# Patient Record
Sex: Female | Born: 1945
Health system: Southern US, Community
[De-identification: ages and names within clinical notes are randomized; demographics above are authoritative.]

## PROBLEM LIST (undated history)

## (undated) DIAGNOSIS — Z9289 Personal history of other medical treatment: Secondary | ICD-10-CM

## (undated) DIAGNOSIS — Z9989 Dependence on other enabling machines and devices: Secondary | ICD-10-CM

## (undated) DIAGNOSIS — I5042 Chronic combined systolic (congestive) and diastolic (congestive) heart failure: Secondary | ICD-10-CM

## (undated) DIAGNOSIS — I4819 Other persistent atrial fibrillation: Secondary | ICD-10-CM

## (undated) DIAGNOSIS — S72009A Fracture of unspecified part of neck of unspecified femur, initial encounter for closed fracture: Secondary | ICD-10-CM

## (undated) DIAGNOSIS — I428 Other cardiomyopathies: Secondary | ICD-10-CM

## (undated) DIAGNOSIS — N182 Chronic kidney disease, stage 2 (mild): Secondary | ICD-10-CM

## (undated) DIAGNOSIS — C4490 Unspecified malignant neoplasm of skin, unspecified: Secondary | ICD-10-CM

## (undated) DIAGNOSIS — I10 Essential (primary) hypertension: Secondary | ICD-10-CM

## (undated) DIAGNOSIS — J189 Pneumonia, unspecified organism: Secondary | ICD-10-CM

## (undated) DIAGNOSIS — R0600 Dyspnea, unspecified: Secondary | ICD-10-CM

## (undated) DIAGNOSIS — C50919 Malignant neoplasm of unspecified site of unspecified female breast: Secondary | ICD-10-CM

## (undated) HISTORY — PX: PARTIAL HIP ARTHROPLASTY: SHX733

## (undated) HISTORY — DX: Fracture of unspecified part of neck of unspecified femur, initial encounter for closed fracture: S72.009A

## (undated) HISTORY — DX: Malignant neoplasm of unspecified site of unspecified female breast: C50.919

## (undated) HISTORY — DX: Chronic combined systolic (congestive) and diastolic (congestive) heart failure: I50.42

## (undated) HISTORY — PX: TUBAL LIGATION: SHX77

## (undated) HISTORY — DX: Chronic kidney disease, stage 2 (mild): N18.2

## (undated) HISTORY — DX: Other persistent atrial fibrillation: I48.19

## (undated) HISTORY — PX: OTHER SURGICAL HISTORY: SHX169

---

## 1997-06-10 DIAGNOSIS — C50919 Malignant neoplasm of unspecified site of unspecified female breast: Secondary | ICD-10-CM

## 1997-06-10 HISTORY — DX: Malignant neoplasm of unspecified site of unspecified female breast: C50.919

## 1997-06-10 HISTORY — PX: MASTECTOMY: SHX3

## 1998-05-23 ENCOUNTER — Other Ambulatory Visit: Admission: RE | Admit: 1998-05-23 | Discharge: 1998-05-23 | Payer: Self-pay | Admitting: Obstetrics & Gynecology

## 2000-02-29 ENCOUNTER — Other Ambulatory Visit: Admission: RE | Admit: 2000-02-29 | Discharge: 2000-02-29 | Payer: Self-pay | Admitting: Obstetrics & Gynecology

## 2000-04-17 ENCOUNTER — Encounter: Admission: RE | Admit: 2000-04-17 | Discharge: 2000-04-17 | Payer: Self-pay | Admitting: Surgery

## 2000-04-17 ENCOUNTER — Encounter: Payer: Self-pay | Admitting: Surgery

## 2000-04-17 ENCOUNTER — Other Ambulatory Visit: Admission: RE | Admit: 2000-04-17 | Discharge: 2000-04-17 | Payer: Self-pay | Admitting: Surgery

## 2000-04-23 ENCOUNTER — Encounter: Admission: RE | Admit: 2000-04-23 | Discharge: 2000-07-22 | Payer: Self-pay | Admitting: *Deleted

## 2000-05-05 ENCOUNTER — Encounter: Admission: RE | Admit: 2000-05-05 | Discharge: 2000-05-05 | Payer: Self-pay | Admitting: Surgery

## 2000-05-05 ENCOUNTER — Encounter: Payer: Self-pay | Admitting: Surgery

## 2000-05-09 ENCOUNTER — Ambulatory Visit (HOSPITAL_BASED_OUTPATIENT_CLINIC_OR_DEPARTMENT_OTHER): Admission: RE | Admit: 2000-05-09 | Discharge: 2000-05-09 | Payer: Self-pay | Admitting: Surgery

## 2000-05-09 ENCOUNTER — Encounter (INDEPENDENT_AMBULATORY_CARE_PROVIDER_SITE_OTHER): Payer: Self-pay | Admitting: *Deleted

## 2000-05-09 ENCOUNTER — Encounter: Payer: Self-pay | Admitting: Surgery

## 2000-06-05 ENCOUNTER — Ambulatory Visit (HOSPITAL_COMMUNITY): Admission: RE | Admit: 2000-06-05 | Discharge: 2000-06-06 | Payer: Self-pay | Admitting: Surgery

## 2000-06-05 ENCOUNTER — Encounter (INDEPENDENT_AMBULATORY_CARE_PROVIDER_SITE_OTHER): Payer: Self-pay | Admitting: Specialist

## 2001-03-27 ENCOUNTER — Other Ambulatory Visit: Admission: RE | Admit: 2001-03-27 | Discharge: 2001-03-27 | Payer: Self-pay | Admitting: Obstetrics & Gynecology

## 2002-04-09 ENCOUNTER — Other Ambulatory Visit: Admission: RE | Admit: 2002-04-09 | Discharge: 2002-04-09 | Payer: Self-pay | Admitting: Obstetrics & Gynecology

## 2002-10-20 ENCOUNTER — Other Ambulatory Visit: Admission: RE | Admit: 2002-10-20 | Discharge: 2002-10-20 | Payer: Self-pay | Admitting: Obstetrics & Gynecology

## 2003-04-20 ENCOUNTER — Other Ambulatory Visit: Admission: RE | Admit: 2003-04-20 | Discharge: 2003-04-20 | Payer: Self-pay | Admitting: Obstetrics & Gynecology

## 2003-04-20 ENCOUNTER — Other Ambulatory Visit: Admission: RE | Admit: 2003-04-20 | Discharge: 2003-04-20 | Payer: Self-pay | Admitting: Internal Medicine

## 2004-04-24 ENCOUNTER — Other Ambulatory Visit: Admission: RE | Admit: 2004-04-24 | Discharge: 2004-04-24 | Payer: Self-pay | Admitting: Obstetrics & Gynecology

## 2005-05-13 ENCOUNTER — Other Ambulatory Visit: Admission: RE | Admit: 2005-05-13 | Discharge: 2005-05-13 | Payer: Self-pay | Admitting: Obstetrics & Gynecology

## 2009-03-10 LAB — HM PAP SMEAR

## 2009-06-10 DIAGNOSIS — S72009A Fracture of unspecified part of neck of unspecified femur, initial encounter for closed fracture: Secondary | ICD-10-CM

## 2009-06-10 HISTORY — DX: Fracture of unspecified part of neck of unspecified femur, initial encounter for closed fracture: S72.009A

## 2010-02-12 ENCOUNTER — Inpatient Hospital Stay (HOSPITAL_COMMUNITY): Admission: EM | Admit: 2010-02-12 | Discharge: 2010-02-16 | Payer: Self-pay | Admitting: Emergency Medicine

## 2010-08-23 LAB — BASIC METABOLIC PANEL
BUN: 12 mg/dL (ref 6–23)
BUN: 6 mg/dL (ref 6–23)
BUN: 8 mg/dL (ref 6–23)
CO2: 28 mEq/L (ref 19–32)
Calcium: 7.8 mg/dL — ABNORMAL LOW (ref 8.4–10.5)
Chloride: 103 mEq/L (ref 96–112)
Chloride: 108 mEq/L (ref 96–112)
Chloride: 109 mEq/L (ref 96–112)
Creatinine, Ser: 0.59 mg/dL (ref 0.4–1.2)
Creatinine, Ser: 0.98 mg/dL (ref 0.4–1.2)
GFR calc Af Amer: >60 mL/min (ref >60)
GFR calc Af Amer: >60 mL/min (ref >60)
GFR calc Af Amer: >60 mL/min (ref >60)
GFR calc non Af Amer: 58 mL/min — ABNORMAL LOW (ref >60)
GFR calc non Af Amer: >60 mL/min (ref >60)
GFR calc non Af Amer: >60 mL/min (ref >60)
Potassium: 3.2 mEq/L — ABNORMAL LOW (ref 3.5–5.1)
Potassium: 3.6 mEq/L (ref 3.5–5.1)
Potassium: 4.3 mEq/L (ref 3.5–5.1)
Sodium: 137 mEq/L (ref 135–145)
Sodium: 138 mEq/L (ref 135–145)

## 2010-08-23 LAB — PROTIME-INR
INR: 1.06 (ref 0.00–1.49)
INR: 1.18 (ref 0.00–1.49)
INR: 1.32 (ref 0.00–1.49)
INR: 1.75 — ABNORMAL HIGH (ref 0.00–1.49)
Prothrombin Time: 15.2 seconds (ref 11.6–15.2)
Prothrombin Time: 16.6 seconds — ABNORMAL HIGH (ref 11.6–15.2)

## 2010-08-23 LAB — CBC
HCT: 29.5 % — ABNORMAL LOW (ref 36.0–46.0)
HCT: 34.8 % — ABNORMAL LOW (ref 36.0–46.0)
Hemoglobin: 12 g/dL (ref 12.0–15.0)
Hemoglobin: 9.8 g/dL — ABNORMAL LOW (ref 12.0–15.0)
MCH: 32.7 pg (ref 26.0–34.0)
MCHC: 33.3 g/dL (ref 30.0–36.0)
MCV: 93.8 fL (ref 78.0–100.0)
MCV: 94 fL (ref 78.0–100.0)
MCV: 95.1 fL (ref 78.0–100.0)
Platelets: 150 10*3/uL (ref 150–400)
Platelets: 195 10*3/uL (ref 150–400)
RBC: 3.22 MIL/uL — ABNORMAL LOW (ref 3.87–5.11)
RBC: 3.71 MIL/uL — ABNORMAL LOW (ref 3.87–5.11)
RDW: 13.3 % (ref 11.5–15.5)
RDW: 13.4 % (ref 11.5–15.5)
RDW: 13.9 % (ref 11.5–15.5)
WBC: 10 10*3/uL (ref 4.0–10.5)
WBC: 11.3 10*3/uL — ABNORMAL HIGH (ref 4.0–10.5)
WBC: 8.9 10*3/uL (ref 4.0–10.5)

## 2010-08-23 LAB — URINE MICROSCOPIC-ADD ON

## 2010-08-23 LAB — COMPREHENSIVE METABOLIC PANEL
AST: 17 U/L (ref 0–37)
BUN: 11 mg/dL (ref 6–23)
CO2: 25 mEq/L (ref 19–32)
Calcium: 8.3 mg/dL — ABNORMAL LOW (ref 8.4–10.5)
Creatinine, Ser: 0.84 mg/dL (ref 0.4–1.2)
GFR calc Af Amer: >60 mL/min (ref >60)
GFR calc non Af Amer: >60 mL/min (ref >60)

## 2010-08-23 LAB — DIFFERENTIAL
Basophils Absolute: 0 10*3/uL (ref 0.0–0.1)
Eosinophils Absolute: 0 10*3/uL (ref 0.0–0.7)
Eosinophils Relative: 0 % (ref 0–5)

## 2010-08-23 LAB — URINE CULTURE: Culture  Setup Time: 201109060529

## 2010-08-23 LAB — HEMOGLOBIN A1C
Hgb A1c MFr Bld: 5.2 % (ref <5.7)
Mean Plasma Glucose: 103 mg/dL (ref <117)

## 2010-08-23 LAB — TYPE AND SCREEN: ABO/RH(D): O POS

## 2010-08-23 LAB — URINALYSIS, ROUTINE W REFLEX MICROSCOPIC
Bilirubin Urine: NEGATIVE
Hgb urine dipstick: NEGATIVE
Specific Gravity, Urine: 1.021 (ref 1.005–1.030)
Urobilinogen, UA: 1 mg/dL (ref 0.0–1.0)

## 2010-08-23 LAB — POCT CARDIAC MARKERS
Myoglobin, poc: 80.8 ng/mL (ref 12–200)
Troponin i, poc: <0.05 ng/mL (ref 0.00–0.09)

## 2010-10-26 NOTE — Op Note (Signed)
[place]. [facility]  Patient:    Natasha Dickson, Natasha Dickson                        MRN: [id number] Proc. Date: 06/05/00 Adm. Date:  [id number] Disc. Date: [id number] Attending:  Charlton Haws                           Operative Report  PREOPERATIVE DIAGNOSES: 1. Acquired absence, left breast. 2. Personal history of carcinoma of the left breast.  PROCEDURE:  Immediate breast reconstruction with retropectoral placement of McGhan silicone-textured saline-filled prosthesis, 300 cc normal saline added.  SURGEON:  Consuello Bossier., M.D.  ASSISTANT:  Magnus Ivan, R.N.F.A.  ANESTHESIA:  General endotracheal.  FINDINGS:  Please see Dr. Weldon Inches operative note for his left total mastectomy.  I had apprised the patient of her options, including immediate versus delayed reconstruction and the possibility of doing a TRAM flap versus a prosthetic reconstruction.  The above surgical procedure was carried out.  DESCRIPTION OF PROCEDURE:  The patient had a left total mastectomy.  There was noted to be good hemostasis.  The pectoralis major muscle was split in the direction of its fibers.  With fiberoptic illumination, a generous retropectoral space was created, trying to get as best as possible inferiorly, laterally, and medially, trying to get as much total tissue coverage as possible.  At this point, the wound was further irrigated with normal saline and hemostasis thoroughly checked.  A 10 mm Blake drain was placed in the axilla and brought out through a separate stab wound.  The tissue expander had 200 cc of normal saline added, and then it was placed and the muscle closed with interrupted 3-0 Vicryl.  An additional 100 cc of normal saline was added to give a total of 300 cc.  Of note, the specimen removed by Dr. Jamey Ripa weighed 933 g.  At this point, the subcutaneous tissues were closed with interrupted 3-0 Vicryl.  The skin was then closed with running  subcuticular 4-0 Vicryl.  Steri-Strips, Xeroform, 4 x 8s, ABD, and a Hypafix dressing were then applied.  The patient tolerated the procedure well and was able to be discharged from the operating room to the recovery room in satisfactory condition. DD:  06/05/00 TD:  06/05/00 Job: [id number]

## 2010-10-26 NOTE — Op Note (Signed)
[place]. [facility]  Patient:    Natasha Dickson, Natasha Dickson                        MRN: [id number] Proc. Date: 06/05/00 Adm. Date:  [id number] Disc. Date: [id number] Attending:  Charlton Haws                           Operative Report  PREOPERATIVE DIAGNOSIS:  Ductal carcinoma in situ, left breast.  POSTOPERATIVE DIAGNOSIS:  Ductal carcinoma in situ, left breast.  PROCEDURE:  Left total mastectomy.  SURGEON:  Currie Paris, M.D.  ANESTHESIA:  General endotracheal.  CLINICAL HISTORY:  The patient is a 65 year old woman who had a partial mastectomy done for DCIS, but unfortunately both her medial margin, which was subareolar, and the superior margin were positive focally for residual DCIS. I had a lengthy discussion of the alternatives, both with radiation therapy and with the patient, and it was elected to proceed to total mastectomy with immediate reconstruction via an implant.  Sentinel node dissection was done at the time of the partial mastectomy and was negative.  DESCRIPTION OF PROCEDURE:  The patient was brought to the operating room and after satisfactory general endotracheal anesthesia had been obtained, the breast was prepped and draped.  Both sides were prepped out as a field so that Dr. Stephens November would have the opposite side to view for symmetry purposes.  An elliptical outline was made, trying to spare as much skin as possible since we had noninvasive disease, but excising the prior scar from the lumpectomy, which started at the edge of the areola laterally and went laterally almost to the anterior axillary line.  After the incision was made, a superior flap was raised medially to the sternum, superiorly to the clavicle, and laterally out into the axillary area, elevating some of the skin off so that we would be able to get any of the axillary tail breast tissue.  The inferior flap was then raised after making the incision and taken  medially to the sternum, inferior to the inframammary fold, and laterally out to about the level of the latissimus.  The patient is fairly obese, and it was difficult to ascertain this exactly initially, but once we freed up the flap a little bit more, I was able to see the latissimus and end the dissection at that level.  The breast was then removed from the underlying muscle starting medially and working laterally, using coagulation current of the cautery.  I did enter the old biopsy site and re-excised that, taking a little bit of muscle laterally right at the pectoral edge to be sure we had all this tissue completely removed.  We did not do any further axillary dissection.  Once the breast had been removed, the wound was irrigated, and hemostasis achieved.  At this point, Dr. Stephens November came in and proceeded to do the reconstruction and close.  The patient tolerated the procedure well.  Estimated blood loss was 100 cc. There were no operative complications to this point. DD:  06/05/00 TD:  06/05/00 Job: [id number]

## 2011-03-26 ENCOUNTER — Ambulatory Visit (INDEPENDENT_AMBULATORY_CARE_PROVIDER_SITE_OTHER): Payer: Medicare Other | Admitting: Family Medicine

## 2011-03-26 ENCOUNTER — Encounter: Payer: Self-pay | Admitting: Family Medicine

## 2011-03-26 DIAGNOSIS — Z8249 Family history of ischemic heart disease and other diseases of the circulatory system: Secondary | ICD-10-CM

## 2011-03-26 DIAGNOSIS — E7800 Pure hypercholesterolemia, unspecified: Secondary | ICD-10-CM

## 2011-03-26 DIAGNOSIS — R87619 Unspecified abnormal cytological findings in specimens from cervix uteri: Secondary | ICD-10-CM

## 2011-03-26 DIAGNOSIS — C50919 Malignant neoplasm of unspecified site of unspecified female breast: Secondary | ICD-10-CM

## 2011-03-26 DIAGNOSIS — R739 Hyperglycemia, unspecified: Secondary | ICD-10-CM

## 2011-03-26 DIAGNOSIS — Z23 Encounter for immunization: Secondary | ICD-10-CM

## 2011-03-26 DIAGNOSIS — M8580 Other specified disorders of bone density and structure, unspecified site: Secondary | ICD-10-CM

## 2011-03-26 DIAGNOSIS — Z13220 Encounter for screening for lipoid disorders: Secondary | ICD-10-CM

## 2011-03-26 DIAGNOSIS — S72009A Fracture of unspecified part of neck of unspecified femur, initial encounter for closed fracture: Secondary | ICD-10-CM

## 2011-03-26 DIAGNOSIS — E785 Hyperlipidemia, unspecified: Secondary | ICD-10-CM

## 2011-03-26 LAB — HM MAMMOGRAPHY

## 2011-03-26 NOTE — Patient Instructions (Signed)
Come back for fasting labs. You can get your results through our phone system.  Follow the instructions on the blue card. See Shirlee Limerick about your referral before your leave today. Take care.  We'll request your records.  Glad to see you today.

## 2011-03-27 ENCOUNTER — Encounter: Payer: Self-pay | Admitting: Family Medicine

## 2011-03-27 DIAGNOSIS — E785 Hyperlipidemia, unspecified: Secondary | ICD-10-CM | POA: Insufficient documentation

## 2011-03-27 DIAGNOSIS — R87619 Unspecified abnormal cytological findings in specimens from cervix uteri: Secondary | ICD-10-CM | POA: Insufficient documentation

## 2011-03-27 DIAGNOSIS — S72009A Fracture of unspecified part of neck of unspecified femur, initial encounter for closed fracture: Secondary | ICD-10-CM | POA: Insufficient documentation

## 2011-03-27 DIAGNOSIS — Z23 Encounter for immunization: Secondary | ICD-10-CM | POA: Insufficient documentation

## 2011-03-27 NOTE — Assessment & Plan Note (Signed)
Screening for, return for labs.

## 2011-03-27 NOTE — Assessment & Plan Note (Signed)
Requesting records, she'll call about f/u with gyn.

## 2011-03-27 NOTE — Progress Notes (Signed)
New pt to est care.  Requesting records.    H/o hip fracture with lower bone mass per pt.  We talked about options.  Due for DXA.  H/o breast cancer, s/p mastectomy with implant.  Due for mammogram.  No breast tenderness, mass, lump, discharge, skin changes.   Screening for hyperlipidemia, with FH CAD and h/o mild hyperglycemia on prev labs.  Will return for labs.    H/o abnormal pap, per Dr. Chevis Pretty with gyn.    PMH and SH reviewed  ROS: See HPI, otherwise noncontributory.  Meds, vitals, and allergies reviewed.   GEN: nad, alert and oriented HEENT: mucous membranes moist NECK: supple w/o LA CV: rrr PULM: ctab, no inc wob ABD: soft, +bs EXT: no edema SKIN: no acute rash

## 2011-03-27 NOTE — Assessment & Plan Note (Signed)
Done today.

## 2011-03-27 NOTE — Assessment & Plan Note (Addendum)
Requesting records, schedule DXA.  Continue vit D.  Check labs.  >25 min spent with face to face with patient, >50% counseling and/or coordinating care.

## 2011-03-29 ENCOUNTER — Other Ambulatory Visit (INDEPENDENT_AMBULATORY_CARE_PROVIDER_SITE_OTHER): Payer: Medicare Other

## 2011-03-29 DIAGNOSIS — R7309 Other abnormal glucose: Secondary | ICD-10-CM

## 2011-03-29 DIAGNOSIS — S72009A Fracture of unspecified part of neck of unspecified femur, initial encounter for closed fracture: Secondary | ICD-10-CM

## 2011-03-29 DIAGNOSIS — E7800 Pure hypercholesterolemia, unspecified: Secondary | ICD-10-CM

## 2011-03-29 DIAGNOSIS — M899 Disorder of bone, unspecified: Secondary | ICD-10-CM

## 2011-03-29 DIAGNOSIS — M8580 Other specified disorders of bone density and structure, unspecified site: Secondary | ICD-10-CM

## 2011-03-29 DIAGNOSIS — R739 Hyperglycemia, unspecified: Secondary | ICD-10-CM

## 2011-03-29 LAB — BASIC METABOLIC PANEL
BUN: 15 mg/dL (ref 6–23)
CO2: 25 mEq/L (ref 19–32)
Calcium: 8.8 mg/dL (ref 8.4–10.5)
Glucose, Bld: 85 mg/dL (ref 70–99)
Sodium: 142 mEq/L (ref 135–145)

## 2011-03-29 LAB — LIPID PANEL
HDL: 61.9 mg/dL (ref >39.00)
Triglycerides: 95 mg/dL (ref 0.0–149.0)

## 2011-03-29 LAB — HM DEXA SCAN

## 2011-03-30 LAB — VITAMIN D 25 HYDROXY (VIT D DEFICIENCY, FRACTURES): Vit D, 25-Hydroxy: 18 ng/mL — ABNORMAL LOW (ref 30–89)

## 2011-03-31 ENCOUNTER — Other Ambulatory Visit: Payer: Self-pay | Admitting: Family Medicine

## 2011-04-01 ENCOUNTER — Encounter: Payer: Self-pay | Admitting: Family Medicine

## 2011-04-02 ENCOUNTER — Encounter: Payer: Self-pay | Admitting: *Deleted

## 2011-04-02 ENCOUNTER — Encounter: Payer: Self-pay | Admitting: Family Medicine

## 2011-04-03 ENCOUNTER — Other Ambulatory Visit: Payer: Self-pay | Admitting: *Deleted

## 2011-04-03 MED ORDER — ERGOCALCIFEROL 1.25 MG (50000 UT) PO CAPS: ORAL_CAPSULE | ORAL | Status: DC

## 2011-04-04 ENCOUNTER — Encounter: Payer: Self-pay | Admitting: *Deleted

## 2011-04-04 ENCOUNTER — Telehealth: Payer: Self-pay | Admitting: *Deleted

## 2011-04-04 NOTE — Telephone Encounter (Signed)
Try getting vit d 2000 units/tab, OTC.  Take 1 twice a day and recheck level as planned.  Thanks.

## 2011-04-04 NOTE — Telephone Encounter (Signed)
Pt states prescription strength vitamin D is expensive and she is asking if she can just take otc, and, if so, how much should she take.

## 2011-04-04 NOTE — Telephone Encounter (Signed)
Patient advised.  I asked if we could set up the recheck level appt but again she denied and says she will call for it in 8 weeks.

## 2013-02-09 ENCOUNTER — Telehealth: Payer: Self-pay | Admitting: Family Medicine

## 2013-02-09 NOTE — Telephone Encounter (Signed)
Left message on home answering machine asking pt to return my call to reschedule her appt

## 2013-02-09 NOTE — Telephone Encounter (Signed)
Daughter calling with multiple concerns, I explained that Dr. Para March may or may not be able to do everything at one visit because of time and per the daughter if we don't go everything now she may not come back. Daughter states pt has blue toes and her ankles are swollen they are scared she may have a DVT, I immediately advised she needed to go to the emergency room and per daughter the pt will not go. She also has concerns about dementia, always being cold, a rash and several other concerns. I rescheduled the appt to Monday at 2:00 to allow the daughter to be able to come and allow for more time ( she was originally scheduled for ) pt was last seen 2012.

## 2013-02-09 NOTE — Telephone Encounter (Signed)
Agreed,we'll try to work on what we can.  If concern for DVT then needs to be in ER.

## 2013-02-10 ENCOUNTER — Ambulatory Visit: Payer: Medicare Other | Admitting: Family Medicine

## 2013-02-10 NOTE — Telephone Encounter (Signed)
.  left message to have patient return my call.  

## 2013-02-11 ENCOUNTER — Ambulatory Visit: Payer: Medicare Other | Admitting: Family Medicine

## 2013-02-15 ENCOUNTER — Inpatient Hospital Stay (HOSPITAL_COMMUNITY)
Admission: EM | Admit: 2013-02-15 | Discharge: 2013-02-23 | DRG: 292 | Disposition: A | Discharge status: Home / Self Care | Payer: Medicare Other | Attending: Internal Medicine | Admitting: Internal Medicine

## 2013-02-15 ENCOUNTER — Ambulatory Visit: Payer: Medicare Other | Admitting: Family Medicine

## 2013-02-15 ENCOUNTER — Encounter (HOSPITAL_COMMUNITY): Payer: Self-pay | Admitting: Cardiology

## 2013-02-15 ENCOUNTER — Encounter: Payer: Self-pay | Admitting: Family Medicine

## 2013-02-15 ENCOUNTER — Ambulatory Visit (INDEPENDENT_AMBULATORY_CARE_PROVIDER_SITE_OTHER): Payer: Medicare Other | Admitting: Family Medicine

## 2013-02-15 ENCOUNTER — Emergency Department (HOSPITAL_COMMUNITY): Payer: Medicare Other

## 2013-02-15 VITALS — BP 102/66 | HR 77 | Temp 97.3°F | Ht 61.0 in | Wt 252.2 lb

## 2013-02-15 DIAGNOSIS — Z8249 Family history of ischemic heart disease and other diseases of the circulatory system: Secondary | ICD-10-CM | POA: Diagnosis not present

## 2013-02-15 DIAGNOSIS — J811 Chronic pulmonary edema: Secondary | ICD-10-CM | POA: Diagnosis not present

## 2013-02-15 DIAGNOSIS — M1990 Unspecified osteoarthritis, unspecified site: Secondary | ICD-10-CM | POA: Diagnosis present

## 2013-02-15 DIAGNOSIS — E669 Obesity, unspecified: Secondary | ICD-10-CM | POA: Diagnosis present

## 2013-02-15 DIAGNOSIS — J449 Chronic obstructive pulmonary disease, unspecified: Secondary | ICD-10-CM | POA: Diagnosis present

## 2013-02-15 DIAGNOSIS — Z853 Personal history of malignant neoplasm of breast: Secondary | ICD-10-CM | POA: Diagnosis not present

## 2013-02-15 DIAGNOSIS — I10 Essential (primary) hypertension: Secondary | ICD-10-CM | POA: Diagnosis present

## 2013-02-15 DIAGNOSIS — Z9851 Tubal ligation status: Secondary | ICD-10-CM | POA: Diagnosis not present

## 2013-02-15 DIAGNOSIS — I428 Other cardiomyopathies: Secondary | ICD-10-CM | POA: Diagnosis present

## 2013-02-15 DIAGNOSIS — I509 Heart failure, unspecified: Secondary | ICD-10-CM | POA: Diagnosis present

## 2013-02-15 DIAGNOSIS — Z452 Encounter for adjustment and management of vascular access device: Secondary | ICD-10-CM | POA: Diagnosis not present

## 2013-02-15 DIAGNOSIS — I5041 Acute combined systolic (congestive) and diastolic (congestive) heart failure: Secondary | ICD-10-CM | POA: Diagnosis not present

## 2013-02-15 DIAGNOSIS — R600 Localized edema: Secondary | ICD-10-CM

## 2013-02-15 DIAGNOSIS — Z9010 Acquired absence of unspecified breast and nipple: Secondary | ICD-10-CM | POA: Diagnosis not present

## 2013-02-15 DIAGNOSIS — R0602 Shortness of breath: Secondary | ICD-10-CM

## 2013-02-15 DIAGNOSIS — Z79899 Other long term (current) drug therapy: Secondary | ICD-10-CM

## 2013-02-15 DIAGNOSIS — Z6841 Body Mass Index (BMI) 40.0 and over, adult: Secondary | ICD-10-CM

## 2013-02-15 DIAGNOSIS — E876 Hypokalemia: Secondary | ICD-10-CM

## 2013-02-15 DIAGNOSIS — R0989 Other specified symptoms and signs involving the circulatory and respiratory systems: Secondary | ICD-10-CM | POA: Diagnosis not present

## 2013-02-15 DIAGNOSIS — Z800 Family history of malignant neoplasm of digestive organs: Secondary | ICD-10-CM | POA: Diagnosis not present

## 2013-02-15 DIAGNOSIS — Z836 Family history of other diseases of the respiratory system: Secondary | ICD-10-CM

## 2013-02-15 DIAGNOSIS — Z7901 Long term (current) use of anticoagulants: Secondary | ICD-10-CM | POA: Diagnosis not present

## 2013-02-15 DIAGNOSIS — I420 Dilated cardiomyopathy: Secondary | ICD-10-CM

## 2013-02-15 DIAGNOSIS — I4891 Unspecified atrial fibrillation: Secondary | ICD-10-CM | POA: Diagnosis not present

## 2013-02-15 DIAGNOSIS — J4489 Other specified chronic obstructive pulmonary disease: Secondary | ICD-10-CM | POA: Diagnosis present

## 2013-02-15 DIAGNOSIS — E785 Hyperlipidemia, unspecified: Secondary | ICD-10-CM | POA: Diagnosis not present

## 2013-02-15 DIAGNOSIS — I369 Nonrheumatic tricuspid valve disorder, unspecified: Secondary | ICD-10-CM | POA: Diagnosis not present

## 2013-02-15 DIAGNOSIS — R609 Edema, unspecified: Secondary | ICD-10-CM | POA: Diagnosis not present

## 2013-02-15 HISTORY — DX: Unspecified osteoarthritis, unspecified site: M19.90

## 2013-02-15 HISTORY — DX: Other cardiomyopathies: I42.8

## 2013-02-15 LAB — BASIC METABOLIC PANEL
CO2: 17 mEq/L — ABNORMAL LOW (ref 19–32)
Chloride: 102 mEq/L (ref 96–112)
GFR calc Af Amer: 78 mL/min — ABNORMAL LOW (ref >90)
Potassium: 5.1 mEq/L (ref 3.5–5.1)
Sodium: 135 mEq/L (ref 135–145)

## 2013-02-15 LAB — APTT: aPTT: 26 seconds (ref 24–37)

## 2013-02-15 LAB — CBC
MCV: 90.7 fL (ref 78.0–100.0)
Platelets: 168 10*3/uL (ref 150–400)
RBC: 5.04 MIL/uL (ref 3.87–5.11)
RDW: 16.3 % — ABNORMAL HIGH (ref 11.5–15.5)
WBC: 7.6 10*3/uL (ref 4.0–10.5)

## 2013-02-15 LAB — POCT I-STAT TROPONIN I: Troponin i, poc: 0.03 ng/mL (ref 0.00–0.08)

## 2013-02-15 LAB — PRO B NATRIURETIC PEPTIDE: Pro B Natriuretic peptide (BNP): 2906 pg/mL — ABNORMAL HIGH (ref 0–125)

## 2013-02-15 LAB — PROTIME-INR
INR: 1.31 (ref 0.00–1.49)
Prothrombin Time: 16 seconds — ABNORMAL HIGH (ref 11.6–15.2)

## 2013-02-15 MED ORDER — HEPARIN (PORCINE) IN NACL 100-0.45 UNIT/ML-% IJ SOLN
1250.0000 [IU]/h | INTRAMUSCULAR | Status: DC
Start: 2013-02-15 — End: 2013-02-17
  Administered 2013-02-15: 1050 [IU]/h via INTRAVENOUS
  Administered 2013-02-16: 1250 [IU]/h via INTRAVENOUS
  Filled 2013-02-15 (×5): qty 250

## 2013-02-15 MED ORDER — ASPIRIN 81 MG PO CHEW
324.0000 mg | CHEWABLE_TABLET | Freq: Once | ORAL | Status: AC
  Administered 2013-02-15: 324 mg via ORAL
  Filled 2013-02-15: qty 4

## 2013-02-15 MED ORDER — NITROGLYCERIN 0.4 MG SL SUBL: 0.4000 mg | SUBLINGUAL_TABLET | SUBLINGUAL | Status: DC | PRN

## 2013-02-15 MED ORDER — ASPIRIN EC 81 MG PO TBEC
81.0000 mg | DELAYED_RELEASE_TABLET | Freq: Every day | ORAL | Status: DC
  Administered 2013-02-16 – 2013-02-23 (×8): 81 mg via ORAL
  Filled 2013-02-15 (×8): qty 1

## 2013-02-15 MED ORDER — DIGOXIN 250 MCG PO TABS
0.2500 mg | ORAL_TABLET | Freq: Every day | ORAL | Status: DC
  Administered 2013-02-16 – 2013-02-23 (×8): 0.25 mg via ORAL
  Filled 2013-02-15 (×8): qty 1

## 2013-02-15 MED ORDER — FUROSEMIDE 10 MG/ML IJ SOLN
80.0000 mg | Freq: Two times a day (BID) | INTRAMUSCULAR | Status: DC
  Administered 2013-02-15 – 2013-02-22 (×14): 80 mg via INTRAVENOUS
  Filled 2013-02-15 (×16): qty 8

## 2013-02-15 MED ORDER — DIGOXIN 0.25 MG/ML IJ SOLN
0.5000 mg | Freq: Once | INTRAMUSCULAR | Status: AC
  Administered 2013-02-15: 0.5 mg via INTRAVENOUS
  Filled 2013-02-15: qty 2

## 2013-02-15 MED ORDER — DIGOXIN 0.25 MG/ML IJ SOLN
0.2500 mg | Freq: Once | INTRAMUSCULAR | Status: AC
  Administered 2013-02-16: 0.25 mg via INTRAVENOUS
  Filled 2013-02-15 (×2): qty 1

## 2013-02-15 MED ORDER — DILTIAZEM HCL 100 MG IV SOLR
5.0000 mg/h | INTRAVENOUS | Status: DC
  Administered 2013-02-15: 5 mg/h via INTRAVENOUS
  Administered 2013-02-16 – 2013-02-17 (×3): 10 mg/h via INTRAVENOUS
  Filled 2013-02-15 (×4): qty 100

## 2013-02-15 MED ORDER — DILTIAZEM HCL 25 MG/5ML IV SOLN
20.0000 mg | Freq: Once | INTRAVENOUS | Status: AC
  Administered 2013-02-15: 20 mg via INTRAVENOUS
  Filled 2013-02-15: qty 5

## 2013-02-15 MED ORDER — FUROSEMIDE 10 MG/ML IJ SOLN
40.0000 mg | Freq: Once | INTRAMUSCULAR | Status: AC
  Administered 2013-02-15: 40 mg via INTRAVENOUS
  Filled 2013-02-15: qty 4

## 2013-02-15 MED ORDER — ASPIRIN 81 MG PO CHEW
324.0000 mg | CHEWABLE_TABLET | ORAL | Status: AC
  Administered 2013-02-15: 324 mg via ORAL
  Filled 2013-02-15: qty 4

## 2013-02-15 MED ORDER — ONDANSETRON HCL 4 MG/2ML IJ SOLN: 4.0000 mg | Freq: Four times a day (QID) | INTRAMUSCULAR | Status: DC | PRN

## 2013-02-15 MED ORDER — HEPARIN BOLUS VIA INFUSION
4000.0000 [IU] | Freq: Once | INTRAVENOUS | Status: AC
  Administered 2013-02-15: 4000 [IU] via INTRAVENOUS
  Filled 2013-02-15: qty 4000

## 2013-02-15 MED ORDER — ACETAMINOPHEN 325 MG PO TABS
650.0000 mg | ORAL_TABLET | ORAL | Status: DC | PRN
  Administered 2013-02-16 – 2013-02-17 (×2): 650 mg via ORAL
  Filled 2013-02-15 (×2): qty 2

## 2013-02-15 MED ORDER — ASPIRIN 300 MG RE SUPP
300.0000 mg | RECTAL | Status: AC
  Filled 2013-02-15: qty 1

## 2013-02-15 MED ORDER — METOPROLOL TARTRATE 25 MG PO TABS
25.0000 mg | ORAL_TABLET | Freq: Four times a day (QID) | ORAL | Status: DC
  Administered 2013-02-16 – 2013-02-21 (×20): 25 mg via ORAL
  Filled 2013-02-15 (×29): qty 1

## 2013-02-15 NOTE — Assessment & Plan Note (Addendum)
In AF.  Likely has been for a period of time; family states HR has been elevated >150 at home in the last few days.  SOB with exertion and fluid overloaded.  Not reasonable for outpatient w/u.  Advised ER eval.  Declined EMS.  She'll go to [facility] ER and we'll call ahead.  D/w pt and daughter.  Daughter to drive. >25 min spent with face to face with patient, >50% counseling and/or coordinating care.

## 2013-02-15 NOTE — ED Notes (Signed)
Pt up to bedside commode without any problems 

## 2013-02-15 NOTE — Telephone Encounter (Signed)
Pt seen by Dr. Para March 02/15/13.

## 2013-02-15 NOTE — ED Notes (Signed)
IV start unsuccessful X 2 Attempts.  VAS Team paged

## 2013-02-15 NOTE — Patient Instructions (Addendum)
Go to the ER at [facility] on [facility] [address] [facility].  Don't stop on the way.  Tell them you are in AF and short of breath with walking.  Tell them you saw me this AM.  We'll call ahead.

## 2013-02-15 NOTE — ED Notes (Signed)
Patient to ED from Dr Lianne Bushy office. She  C/O severe swelling of legs, stomach.  C/O shortness of breath, difficulty sleeping.  Also C/O rapid heart rate.  C/O rash and swelling of BLE.  Denies a history of heart failure but does C/O pitting edema.

## 2013-02-15 NOTE — Progress Notes (Signed)
Seen once in 2012.  Had not had f/u since then.    Cold intolerance at baseline, but worse over the last 8-12 weeks.  Then her ankles and feet started swelling in there last 2 weeks.  She has more abd swelling. Anterior shins are reddish recently.  She gets SOB with exertion, now laying on 2-3 pillows.  No CP at all.  No dysuria.  No FCNAVD.   Weight is up 50 lbs from last OV.   Meds, vitals, and allergies reviewed.   ROS: See HPI.  Otherwise, noncontributory.  Nad but SOB with exertion.  Mmm Tachy, IRR ctab but exam limited by habitus abd soft, not ttp but exam limited by habitus Ext with 3+ BLE edema with brawny skin changes Intact DP pulses B

## 2013-02-15 NOTE — ED Notes (Signed)
Pt here with family- reports that over the past couple of weeks she has had increased swelling to the legs and feet. Also reports pain in her legs and blisters. States increased weakness also for a couple of weeks. States she was at [facility] and found new onset a-fib. Denies any chest pain at this time.

## 2013-02-15 NOTE — ED Notes (Signed)
IV team at bedside.  Warm blanket given, family at bedside

## 2013-02-15 NOTE — H&P (Signed)
Patient ID: Natasha Dickson MRN: [id number], DOB/AGE: 11-Jul-1945   Admit date: 02/15/2013 Date of Consult: @TODAY @  Primary Physician: Crawford Givens, MD Primary Cardiologist: New    Problem List: Past Medical History  Diagnosis Date  . Cancer 1999    Stage I in left breast  . Fractured hip 2011    Right.  Soreness when standing. (Dr. Cleophas Dunker)    Past Surgical History  Procedure Laterality Date  . Mastectomy  1999    Left breast with saline implant.  . Tubal ligation       Allergies: No Known Allergies  HPI: patient is a 67 yo who presents to the [facility] ER with SOB   The patient was seen by Saintclair Halsted in clinic today. She Complained of ankle/foot edema over the past couple weeks, abdominal swelling.  Also SOB with exertion and orthopnea.  She had not been seen in his clinic since 2012   She was found to be in afib with an elevated HR and she was advised to go to the ER.  The patient and her family say that she has been slowing down for awhile  She gives out easier  Very weak  No CP  No syncope.  Notes rare palpitations.     Inpatient Medications:    Family History  Problem Relation Age of Onset  . COPD Mother     from long-term smoking  . Cancer Father     colon  . Heart disease Father   . Heart disease Brother   . Heart disease Son     congenital heart disease, transposition of great vessels- corrected with  MI  as an adult     History   Social History  . Marital Status: Married    Spouse Name: N/A    Number of Children: N/A  . Years of Education: N/A   Occupational History  . Cashier [employer]   Social History Main Topics  . Smoking status: Never Smoker   . Smokeless tobacco: Never Used  . Alcohol Use: Yes  . Drug Use: No  . Sexual Activity: Not on file   Other Topics Concern  . Not on file   Social History Narrative   High School grad.   Married 40+ years   Conservation officer, nature at [employer]           Review of Systems: Patinet with years of  cold intolerance  Otherwise All other systems reviewed and are otherwise negative except as noted above.  Physical Exam: Filed Vitals:   02/15/13 1645  BP: 115/68  Pulse:   Temp:   Resp: 19    Intake/Output Summary (Last 24 hours) at 02/15/13 1723 Last data filed at 02/15/13 1648  Gross per 24 hour  Intake      0 ml  Output     75 ml  Net    -75 ml    General: Obese 67 yo who is dyspneic getting back into bed from commode Head: Normocephalic, atraumatic, sclera non-icteric Neck: Negative for carotid bruits. JVP elevated to below jaw   Lungs: Clear bilaterally to auscultation without wheezes, rales, or rhonchi. Breathing is unlabored. Heart: Irreg rate/rhythm. S1 S2. No murmurs, rubs, or gallops appreciated. Abdomen: Obese, no hepatomegaly.  No definite masses. Msk:  Strength and tone appears normal for age. Extremities: 3+ edema bilaterally   Distal pedal pulses are 2+ and equal bilaterally. Neuro: Alert and oriented X 3. Moves all extremities spontaneously. Psych:  Responds to  questions appropriately with a normal affect.  Labs: Results for orders placed during the hospital encounter of 02/15/13 (from the past 24 hour(s))  CBC     Status: Abnormal   Collection Time    02/15/13 12:32 PM      Result Value Range   WBC 7.6  4.0 - 10.5 K/uL   RBC 5.04  3.87 - 5.11 MIL/uL   Hemoglobin 14.8  12.0 - 15.0 g/dL   HCT 40.9  81.1 - 91.4 %   MCV 90.7  78.0 - 100.0 fL   MCH 29.4  26.0 - 34.0 pg   MCHC 32.4  30.0 - 36.0 g/dL   RDW 78.2 (*) 95.6 - 21.3 %   Platelets 168  150 - 400 K/uL  PROTIME-INR     Status: Abnormal   Collection Time    02/15/13  2:02 PM      Result Value Range   Prothrombin Time 16.0 (*) 11.6 - 15.2 seconds   INR 1.31  0.00 - 1.49  APTT     Status: None   Collection Time    02/15/13  2:02 PM      Result Value Range   aPTT 26  24 - 37 seconds  POCT I-STAT TROPONIN I     Status: None   Collection Time    02/15/13  3:53 PM      Result Value Range    Troponin i, poc 0.03  0.00 - 0.08 ng/mL   Comment 3           BASIC METABOLIC PANEL     Status: Abnormal   Collection Time    02/15/13  3:54 PM      Result Value Range   Sodium 135  135 - 145 mEq/L   Potassium 5.1  3.5 - 5.1 mEq/L   Chloride 102  96 - 112 mEq/L   CO2 17 (*) 19 - 32 mEq/L   Glucose, Bld 81  70 - 99 mg/dL   BUN 28 (*) 6 - 23 mg/dL   Creatinine, Ser 0.86  0.50 - 1.10 mg/dL   Calcium 8.9  8.4 - 57.8 mg/dL   GFR calc non Af Amer 67 (*) >90 mL/min   GFR calc Af Amer 78 (*) >90 mL/min  PRO B NATRIURETIC PEPTIDE     Status: Abnormal   Collection Time    02/15/13  3:54 PM      Result Value Range   Pro B Natriuretic peptide (BNP) 2906.0 (*) 0 - 125 pg/mL    Radiology/Studies: Dg Chest 2 View  02/15/2013   CLINICAL DATA:  Irregular heart beat. Swelling, shortness of breath, weakness.  EXAM: CHEST  2 VIEW  COMPARISON:  02/12/2010  FINDINGS: Cardiomegaly with vascular congestion. Lungs are clear. No effusions. Degenerative changes in the thoracic spine.  IMPRESSION: Cardiomegaly with vascular congestion.   Electronically Signed   By: Charlett Nose M.D.   On: 02/15/2013 13:19    EKG:   Atrial fibrillation  157 bpm.  Nonspecific ST T wave changes.  ASSESSMENT AND PLAN:  Patient is a 67 yo with SOB, weakness, edema.  Found to be in rapid afib.  Question duration. Plan:  Heparin.  IV Lasix  Place foley for now as patient is quite dyspnic with movements. On cardiazem  Will also give Digoxin and lopressor Check TSH Echo to evaluate LV and RV function.   Discussed with patient plans for anticoagulation and rate control  If cannot get comfortable or rates  cannot be controlled will plan TEE/Cardioversion.    2.  HTN  Patient is hypertensive on exam.  Follow as diurese  3.  DJD  Patient is poor IV access.  L arm cannot be used because of previous surgery  Will consult IV team for PICC line    Signed, Dietrich Pates 02/15/2013, 5:23 PM ]

## 2013-02-15 NOTE — Progress Notes (Signed)
ANTICOAGULATION CONSULT NOTE - Initial Consult  Pharmacy Consult for Heparin Indication: New onset Afib with RVR  No Known Allergies  Patient Measurements: Height: 5\' 1"  (154.9 cm) Weight: 252 lb (114.306 kg) IBW/kg (Calculated) : 47.8 Heparin Dosing Weight: 76 kg  Vital Signs: Temp: 97.5 F (36.4 C) (09/08 1513) Temp src: Oral (09/08 1513) BP: 117/86 mmHg (09/08 1800) Pulse Rate: 97 (09/08 1700)  Labs:  Recent Labs  02/15/13 1232 02/15/13 1402 02/15/13 1554  HGB 14.8  --   --   HCT 45.7  --   --   PLT 168  --   --   APTT  --  26  --   LABPROT  --  16.0*  --   INR  --  1.31  --   CREATININE  --   --  0.88    Estimated Creatinine Clearance: 73.9 ml/min (by C-G formula based on Cr of 0.88).   Medical History: Past Medical History  Diagnosis Date  . Cancer 1999    Stage I in left breast  . Fractured hip 2011    Right.  Soreness when standing. (Dr. Cleophas Dunker)    Assessment: 67 y.o. F who presented to the [facility] on 02/15/13 with palpitations, SOB, and LE swelling and was found to be in new-onset Afib with RVR. Pharmacy was consulted to start heparin for anticoagulation.  The patient was not on any blood thinners PTA, has no hx CVA, recent bleeding, or surgery. Baseline CBC wnl. Hep Wt: 76 kg  Goal of Therapy:  Heparin level 0.3-0.7 units/ml Monitor platelets by anticoagulation protocol: Yes   Plan:  1. Heparin bolus of 4000 units x 1 2. Initiate heparin drip at a rate of 1050 units/hr (10.5 ml/hr) 3. Daily heparin levels, CBC 4. Will continue to monitor for any signs/symptoms of bleeding and will follow up with heparin level in 8 hours   Georgina Pillion, PharmD, BCPS Clinical Pharmacist Pager: [phone] 02/15/2013 7:37 PM

## 2013-02-15 NOTE — ED Provider Notes (Signed)
TIME SEEN: 1:24 PM  CHIEF COMPLAINT: Palpitations, shortness of breath with exertion, lower extremity swelling  HPI: Patient is a 67 y.o. female with history of prior breast cancer status post mastectomy who presents the emergency department with 2 months bilateral lower extremity swelling, orthopnea, dyspnea on exertion. Patient's daughter finally convinced the patient to go to her primary care physician's office today. She was seen by Dr. Para March was found to be in A. fib with RVR. She denies any chest pain or chest discomfort. No prior history of MI, cardiac catheterization. No prior history of PE or DVT. No prior history of CHF. Denies any fever or cough. She is not chronically on oxygen.  ROS: See HPI Constitutional: no fever  Eyes: no drainage  ENT: no runny nose   Cardiovascular:  no chest pain  Resp:  SOB  GI: no vomiting GU: no dysuria Integumentary: no rash  Allergy: no hives  Musculoskeletal: no leg swelling  Neurological: no slurred speech ROS otherwise negative  PAST MEDICAL HISTORY/PAST SURGICAL HISTORY:  Past Medical History  Diagnosis Date  . Cancer 1999    Stage I in left breast  . Fractured hip 2011    Right.  Soreness when standing. (Dr. Cleophas Dunker)    MEDICATIONS:  Prior to Admission medications   Medication Sig Start Date End Date Taking? Authorizing Provider  acetaminophen (TYLENOL) 500 MG tablet Take 1,000 mg by mouth every 6 (six) hours as needed for pain.   Yes Historical Provider, MD  ibuprofen (ADVIL,MOTRIN) 200 MG tablet Take 600 mg by mouth 2 (two) times daily as needed for pain.   Yes Historical Provider, MD  neomycin-polymyxin-pramoxine (NEOSPORIN PLUS) 1 % cream Apply 1 application topically as needed (rash).   Yes Historical Provider, MD  NON FORMULARY Herbal water pill. (OTC)   Yes Historical Provider, MD    ALLERGIES:  No Known Allergies  SOCIAL HISTORY:  History  Substance Use Topics  . Smoking status: Never Smoker   . Smokeless tobacco:  Never Used  . Alcohol Use: Yes    FAMILY HISTORY: Family History  Problem Relation Age of Onset  . COPD Mother     from long-term smoking  . Cancer Father     colon  . Heart disease Father   . Heart disease Brother   . Heart disease Son     congenital heart disease, transposition of great vessels- corrected with  MI  as an adult    EXAM: BP 112/82  Pulse 154  Resp 20  SpO2 96% CONSTITUTIONAL: Alert and oriented and responds appropriately to questions. Well-appearing; well-nourished HEAD: Normocephalic EYES: Conjunctivae clear, PERRL ENT: normal nose; no rhinorrhea; moist mucous membranes; pharynx without lesions noted NECK: Supple, no meningismus, no LAD , JVD present CARD: Tachycardic, irregularly irregular; S1 and S2 appreciated; no murmurs, no clicks, no rubs, no gallops RESP: Normal chest excursion without splinting or tachypnea; patient has crackles at her bases bilaterally, no wheezing or rhonchi ABD/GI: Normal bowel sounds; non-distended; soft, non-tender, no rebound, no guarding BACK:  The back appears normal and is non-tender to palpation, there is no CVA tenderness EXT: Normal ROM in all joints; non-tender to palpation; 4+ pitting edema to the thighs bilaterally; normal capillary refill; no cyanosis    SKIN: Normal color for age and race; warm NEURO: Moves all extremities equally PSYCH: The patient's mood and manner are appropriate. Grooming and personal hygiene are appropriate.  MEDICAL DECISION MAKING: Patient in atrial fibrillation with RVR. Heart rate is in the  150s. This is a new diagnosis for her. She's not currently on anti-anticoagulation. Patient also appears to have volume overload. Will start diltiazem drip, give aspirin and Lasix.  She is normotensive. Patient will need admission. She does not have a cardiologist.    Date: 02/15/2013 12:24 PM  Rate: 157  Rhythm: Atrial fibrillation  QRS Axis: Right axis deviation  Intervals: normal  ST/T Wave  abnormalities: normal  Conduction Disutrbances: none  Narrative Interpretation: Atrial fibrillation with RVR, right axis deviation, nonspecific ST abnormality, no old EKG      ED PROGRESS: Heart rate improved to the 120s after bolus of Cardizem. She is still normotensive. Troponin is negative. Chest x-ray shows vascular congestion. BNP is pending.  BNP is 2900. Will discuss with cardiology. Patient is still in the 120s on 5mg /hr of Diltiazem.  She is diuresing well. Will titrate diltiazem drip as needed for rate control.  4:59 PM  Spoke with cardiology on call Corinda Gubler) for admission.  Layla Maw Ward, DO 02/15/13 1858

## 2013-02-16 ENCOUNTER — Observation Stay (HOSPITAL_COMMUNITY): Payer: Medicare Other

## 2013-02-16 ENCOUNTER — Encounter (HOSPITAL_COMMUNITY): Payer: Self-pay | Admitting: General Practice

## 2013-02-16 ENCOUNTER — Inpatient Hospital Stay (HOSPITAL_COMMUNITY): Payer: Medicare Other

## 2013-02-16 DIAGNOSIS — I369 Nonrheumatic tricuspid valve disorder, unspecified: Secondary | ICD-10-CM

## 2013-02-16 DIAGNOSIS — R0602 Shortness of breath: Secondary | ICD-10-CM

## 2013-02-16 DIAGNOSIS — R0989 Other specified symptoms and signs involving the circulatory and respiratory systems: Secondary | ICD-10-CM | POA: Diagnosis not present

## 2013-02-16 DIAGNOSIS — Z452 Encounter for adjustment and management of vascular access device: Secondary | ICD-10-CM | POA: Diagnosis not present

## 2013-02-16 LAB — BASIC METABOLIC PANEL
CO2: 22 mEq/L (ref 19–32)
Calcium: 8.4 mg/dL (ref 8.4–10.5)
Chloride: 102 mEq/L (ref 96–112)
Glucose, Bld: 106 mg/dL — ABNORMAL HIGH (ref 70–99)
Potassium: 3 mEq/L — ABNORMAL LOW (ref 3.5–5.1)
Sodium: 137 mEq/L (ref 135–145)

## 2013-02-16 LAB — LIPID PANEL
Cholesterol: 97 mg/dL (ref 0–200)
HDL: 26 mg/dL — ABNORMAL LOW (ref >39)
Total CHOL/HDL Ratio: 3.7 RATIO

## 2013-02-16 LAB — CBC
Hemoglobin: 12.7 g/dL (ref 12.0–15.0)
MCH: 29 pg (ref 26.0–34.0)
Platelets: 178 10*3/uL (ref 150–400)
RBC: 4.38 MIL/uL (ref 3.87–5.11)
WBC: 6.7 10*3/uL (ref 4.0–10.5)

## 2013-02-16 LAB — HEPARIN LEVEL (UNFRACTIONATED)
Heparin Unfractionated: 0.27 IU/mL — ABNORMAL LOW (ref 0.30–0.70)
Heparin Unfractionated: 0.38 IU/mL (ref 0.30–0.70)

## 2013-02-16 LAB — TSH: TSH: 2.473 u[IU]/mL (ref 0.350–4.500)

## 2013-02-16 LAB — HEMOGLOBIN A1C
Hgb A1c MFr Bld: 5.7 % — ABNORMAL HIGH (ref <5.7)
Mean Plasma Glucose: 117 mg/dL — ABNORMAL HIGH (ref <117)

## 2013-02-16 MED ORDER — SODIUM CHLORIDE 0.9 % IJ SOLN
10.0000 mL | INTRAMUSCULAR | Status: DC | PRN
  Administered 2013-02-17: 20 mL
  Administered 2013-02-19 – 2013-02-22 (×6): 10 mL
  Administered 2013-02-23: 20 mL

## 2013-02-16 MED ORDER — POTASSIUM CHLORIDE 20 MEQ/15ML (10%) PO LIQD
40.0000 meq | Freq: Three times a day (TID) | ORAL | Status: AC
  Administered 2013-02-16 – 2013-02-17 (×3): 40 meq via ORAL
  Filled 2013-02-16 (×3): qty 30

## 2013-02-16 NOTE — Progress Notes (Signed)
ANTICOAGULATION CONSULT NOTE  Pharmacy Consult for Heparin Indication: New onset Afib with RVR  No Known Allergies  Labs:  Recent Labs  02/15/13 1232 02/15/13 1402 02/15/13 1554 02/16/13 0520 02/16/13 1425  HGB 14.8  --   --  12.7  --   HCT 45.7  --   --  39.3  --   PLT 168  --   --  178  --   APTT  --  26  --   --   --   LABPROT  --  16.0*  --   --   --   INR  --  1.31  --   --   --   HEPARINUNFRC  --   --   --  0.27* 0.38  CREATININE  --   --  0.88 0.96  --     Estimated Creatinine Clearance: 67.8 ml/min (by C-G formula based on Cr of 0.96).  Assessment: 67 year old female with Afib.  Currently on Cardizem drip.  Heparin level therapeutic, no bleeding noted   Goal of Therapy:  Heparin level 0.3-0.7 units/ml Monitor platelets by anticoagulation protocol: Yes   Plan:  Continue heparin at 1250 units / hr Follow up AM labs  Thank you. Okey Regal, PharmD [phone]  02/16/2013 3:11 PM

## 2013-02-16 NOTE — Progress Notes (Signed)
ANTICOAGULATION CONSULT NOTE Pharmacy Consult for Heparin Indication: New onset Afib with RVR  No Known Allergies  Patient Measurements: Height: 5\' 2"  (157.5 cm) Weight: 247 lb 14.4 oz (112.447 kg) (bedscale pt hr up) IBW/kg (Calculated) : 50.1 Heparin Dosing Weight: 76 kg  Vital Signs: Temp: 97.8 F (36.6 C) (09/09 0202) Temp src: Oral (09/09 0202) BP: 102/60 mmHg (09/09 0202) Pulse Rate: 75 (09/09 0202)  Labs:  Recent Labs  02/15/13 1232 02/15/13 1402 02/15/13 1554 02/16/13 0520  HGB 14.8  --   --  12.7  HCT 45.7  --   --  39.3  PLT 168  --   --  178  APTT  --  26  --   --   LABPROT  --  16.0*  --   --   INR  --  1.31  --   --   HEPARINUNFRC  --   --   --  0.27*  CREATININE  --   --  0.88  --     Estimated Creatinine Clearance: 74.5 ml/min (by C-G formula based on Cr of 0.88).  Assessment: 67 y.o. Female wht Afib for heparin  Goal of Therapy:  Heparin level 0.3-0.7 units/ml Monitor platelets by anticoagulation protocol: Yes   Plan:  Increase Heparin 1250 units/hr Check heparin level in 8 hours.  Geannie Risen, PharmD, BCPS  02/16/2013 6:12 AM

## 2013-02-16 NOTE — Procedures (Signed)
Successful exchange of right arm PICC.  Tip in lower SVC and ready to use.

## 2013-02-16 NOTE — Progress Notes (Signed)
The patient's HR was ranging from the 110s-140s.  Her manual BP was 90/50 shortly after titrating the Cardizem drip to 75ml/hr.  The MD was notified.  New orders were given to titrate the Cardizem back down to 15ml/hr and give the 80 mg of IV Lasix and IV Digoxin.  The RN was instructed to hold the metoprolol.  The RN will carry out the orders and continue to monitor the patient.

## 2013-02-16 NOTE — Progress Notes (Signed)
Echocardiogram 2D Echocardiogram has been performed.  Natasha Dickson 02/16/2013, 2:33 PM

## 2013-02-16 NOTE — Progress Notes (Signed)
The patient arrived to 4E28 from the ED at 2115.  She was oriented to the unit and placed on telemetry.  VS were taken and the patient was assessed.  The patient's HR ranged from the 110s-160s at this time.  She was asymptomatic.  The Cardizem drip was titrated to 2ml/hr at 2130.  The call bell was placed within reach and the bed alarm was turned on.  The RN will continue to monitor the patient.

## 2013-02-16 NOTE — Progress Notes (Signed)
The patient's HR is down into the low 100s.  She remains in A. Fib.

## 2013-02-16 NOTE — Progress Notes (Signed)
Utilization Review Completed Ahmira Boisselle J. Yamilet Mcfayden, RN, BSN, NCM [phone]  

## 2013-02-16 NOTE — Progress Notes (Signed)
Patient Name: Natasha Dickson Date of Encounter: 02/16/2013    Active Problems:   * No active hospital problems. *    SUBJECTIVE  The patient is complaining of shortness of breath and lower extremity pain.   CURRENT MEDS . aspirin EC  81 mg Oral Daily  . digoxin  0.25 mg Oral Daily  . furosemide  80 mg Intravenous Q12H  . metoprolol tartrate  25 mg Oral QID    OBJECTIVE  Filed Vitals:   02/15/13 2116 02/15/13 2300 02/16/13 0202 02/16/13 0703  BP: 112/92 90/50 102/60 97/68  Pulse: 116  75 99  Temp: 97.7 F (36.5 C)  97.8 F (36.6 C) 98.4 F (36.9 C)  TempSrc: Oral  Oral Oral  Resp: 18  18 18   Height:      Weight: 247 lb 14.4 oz (112.447 kg)   245 lb 1.6 oz (111.177 kg)  SpO2: 96%  96% 99%    Intake/Output Summary (Last 24 hours) at 02/16/13 0902 Last data filed at 02/16/13 0705  Gross per 24 hour  Intake      0 ml  Output   1250 ml  Net  -1250 ml   Filed Weights   02/15/13 1951 02/15/13 2116 02/16/13 0703  Weight: 252 lb (114.306 kg) 247 lb 14.4 oz (112.447 kg) 245 lb 1.6 oz (111.177 kg)    PHYSICAL EXAM  General: Obese , NAD Head: Normocephalic, atraumatic, sclera non-icteric  Neck: Negative for carotid bruits. JVP elevated to below jaw  Lungs: Mild rales bibasillary Heart: Irreg rate/rhythm. S1 S2. No murmurs, rubs, or gallops appreciated.  Abdomen: Obese, no hepatomegaly. No definite masses.  Msk: Strength and tone appears normal for age.  Extremities: warm, erythematous, 3+ edema bilaterally Distal pedal pulses are 2+ and equal bilaterally.  Neuro: Alert and oriented X 3. Moves all extremities spontaneously.  Psych: Responds to questions appropriately with a normal affect.    Accessory Clinical Findings  CBC  Recent Labs  02/15/13 1232 02/16/13 0520  WBC 7.6 6.7  HGB 14.8 12.7  HCT 45.7 39.3  MCV 90.7 89.7  PLT 168 178   Basic Metabolic Panel  Recent Labs  02/15/13 1554 02/16/13 0520  NA 135 137  K 5.1 3.0*  CL 102 102  CO2 17*  22  GLUCOSE 81 106*  BUN 28* 26*  CREATININE 0.88 0.96  CALCIUM 8.9 8.4   BNP 2906 TnI 0.03   Recent Labs  02/16/13 0520  CHOL 97  HDL 26*  LDLCALC 58  TRIG 63  CHOLHDL 3.7   Thyroid Function Tests No results found for this basename: TSH, T4TOTAL, FREET3, T3FREE, THYROIDAB,  in the last 72 hours  TELE  A fib in RVR   ECG  A fib, HR 120 BPM, nonspecific ST T wave changes  Radiology/Studies  Dg Chest 2 View  02/15/2013   CLINICAL DATA:  Irregular heart beat. Swelling, shortness of breath, weakness.  EXAM: CHEST  2 VIEW  COMPARISON:  02/12/2010  FINDINGS: Cardiomegaly with vascular congestion. Lungs are clear. No effusions. Degenerative changes in the thoracic spine.  IMPRESSION: Cardiomegaly with vascular congestion.   Electronically Signed   By: Charlett Nose M.D.   On: 02/15/2013 13:19    ASSESSMENT AND PLAN  A 67 yo with SOB, weakness, edema. Found to be in rapid afib of unknown duration.   1. Atrial fibrillation of unknown duration, rapid despite Cardizem drip - continue Cardizem for now - continue Heparin drip - follow TTE -  plan for possible TEE tomorrow or day after tomorrow  2. CHF - possibly tachycardia induced, considering her breast cancer history, there might be a component of systolic dysfunction secondary to chemo, await TTE - continue Lasix iv - replace potasium  3. Lower extremity pain/swelling/erytema - pain most probably secondary to significant swelling. Venous Duplex is pending.   Signed, Lars Masson MD

## 2013-02-16 NOTE — Progress Notes (Signed)
Peripherally Inserted Central Catheter/Midline Placement  The IV Nurse has discussed with the patient and/or persons authorized to consent for the patient, the purpose of this procedure and the potential benefits and risks involved with this procedure.  The benefits include less needle sticks, lab draws from the catheter and patient may be discharged home with the catheter.  Risks include, but not limited to, infection, bleeding, blood clot (thrombus formation), and puncture of an artery; nerve damage and irregular heat beat.  Alternatives to this procedure were also discussed.  PICC/Midline Placement Documentation  PICC / Midline Double Lumen 02/16/13 PICC Right Basilic (Active)  Dressing Change Due 02/23/13 02/16/2013  9:00 AM       Stacie Glaze Horton 02/16/2013, 9:51 AM

## 2013-02-17 DIAGNOSIS — R609 Edema, unspecified: Secondary | ICD-10-CM

## 2013-02-17 LAB — COMPREHENSIVE METABOLIC PANEL
ALT: 72 U/L — ABNORMAL HIGH (ref 0–35)
AST: 69 U/L — ABNORMAL HIGH (ref 0–37)
Albumin: 2.7 g/dL — ABNORMAL LOW (ref 3.5–5.2)
Alkaline Phosphatase: 68 U/L (ref 39–117)
BUN: 23 mg/dL (ref 6–23)
CO2: 28 mEq/L (ref 19–32)
Calcium: 8.1 mg/dL — ABNORMAL LOW (ref 8.4–10.5)
Chloride: 101 mEq/L (ref 96–112)
Creatinine, Ser: 1.03 mg/dL (ref 0.50–1.10)
GFR calc Af Amer: 64 mL/min — ABNORMAL LOW (ref >90)
GFR calc non Af Amer: 55 mL/min — ABNORMAL LOW (ref >90)
Glucose, Bld: 121 mg/dL — ABNORMAL HIGH (ref 70–99)
Potassium: 3.4 mEq/L — ABNORMAL LOW (ref 3.5–5.1)
Sodium: 139 mEq/L (ref 135–145)
Total Bilirubin: 0.9 mg/dL (ref 0.3–1.2)
Total Protein: 6 g/dL (ref 6.0–8.3)

## 2013-02-17 MED ORDER — SODIUM CHLORIDE 0.9 % IV SOLN
INTRAVENOUS | Status: DC
  Administered 2013-02-17: 22:00:00 via INTRAVENOUS

## 2013-02-17 MED ORDER — REGADENOSON 0.4 MG/5ML IV SOLN
0.4000 mg | Freq: Once | INTRAVENOUS | Status: AC
  Administered 2013-02-19: 0.4 mg via INTRAVENOUS
  Filled 2013-02-17: qty 5

## 2013-02-17 MED ORDER — AMIODARONE HCL 200 MG PO TABS
400.0000 mg | ORAL_TABLET | Freq: Two times a day (BID) | ORAL | Status: DC
  Administered 2013-02-17 – 2013-02-18 (×3): 400 mg via ORAL
  Filled 2013-02-17 (×5): qty 2

## 2013-02-17 MED ORDER — RIVAROXABAN 20 MG PO TABS
20.0000 mg | ORAL_TABLET | Freq: Every day | ORAL | Status: DC
  Administered 2013-02-17 – 2013-02-23 (×7): 20 mg via ORAL
  Filled 2013-02-17 (×7): qty 1

## 2013-02-17 MED ORDER — POTASSIUM CHLORIDE CRYS ER 20 MEQ PO TBCR
40.0000 meq | EXTENDED_RELEASE_TABLET | Freq: Two times a day (BID) | ORAL | Status: DC
  Administered 2013-02-17 – 2013-02-23 (×12): 40 meq via ORAL
  Filled 2013-02-17 (×15): qty 2

## 2013-02-17 NOTE — Clinical Documentation Improvement (Signed)
THIS DOCUMENT IS NOT A PERMANENT PART OF THE MEDICAL RECORD  Please update your documentation with the medical record to reflect your response to this query. If you need help knowing how to do this please call [phone].  02/17/13  Dear Dr. [facility],  In a better effort to capture your patient's severity of illness, reflect appropriate length of stay and utilization of resources, a review of the patient medical record has revealed the following indicators the diagnosis of Heart Failure.    Based on your clinical judgment, please clarify and document in a progress note and/or discharge summary the clinical condition associated with the following supporting information:  In responding to this query please exercise your independent judgment.  The fact that a query is asked, does not imply that any particular answer is desired or expected.  Possible Clinical Conditions?  Chronic Systolic Congestive Heart Failure Chronic Diastolic Congestive Heart Failure Chronic Systolic & Diastolic Congestive Heart Failure Acute Systolic Congestive Heart Failure Acute Diastolic Congestive Heart Failure Acute Systolic & Diastolic Congestive Heart Failure Acute on Chronic Systolic Congestive Heart Failure Acute on Chronic Diastolic Congestive Heart Failure Acute on Chronic Systolic & Diastolic  Congestive Heart Failure Other Condition Cannot Clinically Determine  Supporting Information:  Risk Factors: ( as per notes) Hx QQ:[id number] for Breat CA, congenital heart disease, & MI as an adult.  Signs & Symptoms: ( as per notes) "SOB, 3+ extremity edema. A-fib, Cardiomegaly with vascular congestion per CXR 9-5 Lungs: Mild rales bibasillary"  Dignostics: 02-15-13 BNP= 2906  Echocardiogram: 02-16-13 Study Conclusions - Left ventricle: Diffuse hypokinesis worse in the inferior wall The cavity size was normal. Wall thickness was normal. Systolic function was moderately to severely reduced. The  estimated ejection fraction was in the range of 30% to 35%. Diffuse hypokinesis. - Left atrium: The atrium was moderately &dilated. - Right atrium: The atrium was moderately &dilated. - Atrial septum: No defect or patent foramen ovale was identified.   Treatment:( as per notes) "Lasix IV"  Reviewed: additional documentation in the medical record BY CHRIS BERGE PA  Thank You,  Nevin Bloodgood RN, BSN, CCDS Clinical Documentation Specialist: [phone] [phone] Health Information Management [place]

## 2013-02-17 NOTE — Progress Notes (Signed)
Went in room to check on pt. Cardizem gtt was off. Asked husband and pt who turned off the Iv and husband said he did it because the bag was getting low. Explained to husband and pt about the medicine and asked him not to touch the Iv pumps any more. When questioned husband said he had turned it of about 20 min ago. Turned gtt back on and placed lock out  on IV pump.

## 2013-02-17 NOTE — Progress Notes (Signed)
Patient Name: Natasha Dickson Date of Encounter: 02/17/2013     Active Problems:   * No active hospital problems. *    SUBJECTIVE  The patient feels better, improved SOB  CURRENT MEDS . aspirin EC  81 mg Oral Daily  . digoxin  0.25 mg Oral Daily  . furosemide  80 mg Intravenous Q12H  . metoprolol tartrate  25 mg Oral QID  . potassium chloride  40 mEq Oral TID    OBJECTIVE  Filed Vitals:   02/16/13 1703 02/16/13 2049 02/17/13 0223 02/17/13 0613  BP: 134/70 105/51 101/76 112/79  Pulse:  77 54 59  Temp:  98.5 F (36.9 C) 98.1 F (36.7 C) 98.4 F (36.9 C)  TempSrc:  Oral Oral Oral  Resp:  18 18 19   Height:      Weight:    242 lb 1 oz (109.8 kg)  SpO2:  95% 96% 95%    Intake/Output Summary (Last 24 hours) at 02/17/13 0919 Last data filed at 02/17/13 4782  Gross per 24 hour  Intake    960 ml  Output   3800 ml  Net  -2840 ml   Filed Weights   02/15/13 2116 02/16/13 0703 02/17/13 0613  Weight: 247 lb 14.4 oz (112.447 kg) 245 lb 1.6 oz (111.177 kg) 242 lb 1 oz (109.8 kg)    PHYSICAL EXAM  General: Obese , NAD  Head: Normocephalic, atraumatic, sclera non-icteric  Neck: Negative for carotid bruits. JVP elevated to below jaw  Lungs: Mild rales bibasillary  Heart: Irreg rate/rhythm. S1 S2. No murmurs, rubs, or gallops appreciated.  Abdomen: Obese, no hepatomegaly. No definite masses.  Msk: Strength and tone appears normal for age.  Extremities: warm, erythematous, 3+ edema bilaterally Distal pedal pulses are 2+ and equal bilaterally.  Neuro: Alert and oriented X 3. Moves all extremities spontaneously.  Psych: Responds to questions appropriately with a normal affect.    Accessory Clinical Findings  CBC  Recent Labs  02/15/13 1232 02/16/13 0520  WBC 7.6 6.7  HGB 14.8 12.7  HCT 45.7 39.3  MCV 90.7 89.7  PLT 168 178   Basic Metabolic Panel  Recent Labs  02/15/13 1554 02/16/13 0520  NA 135 137  K 5.1 3.0*  CL 102 102  CO2 17* 22  GLUCOSE 81 106*    BUN 28* 26*  CREATININE 0.88 0.96  CALCIUM 8.9 8.4   Liver Function Tests No results found for this basename: AST, ALT, ALKPHOS, BILITOT, PROT, ALBUMIN,  in the last 72 hours No results found for this basename: LIPASE, AMYLASE,  in the last 72 hours Cardiac Enzymes No results found for this basename: CKTOTAL, CKMB, CKMBINDEX, TROPONINI,  in the last 72 hours BNP No components found with this basename: POCBNP,  D-Dimer No results found for this basename: DDIMER,  in the last 72 hours Hemoglobin A1C  Recent Labs  02/16/13 0520  HGBA1C 5.7*   Fasting Lipid Panel  Recent Labs  02/16/13 0520  CHOL 97  HDL 26*  LDLCALC 58  TRIG 63  CHOLHDL 3.7   Thyroid Function Tests  Recent Labs  02/15/13 2247  TSH 2.473    TELE  A fib, HR 70   Radiology/Studies  Dg Chest 2 View  02/15/2013   CLINICAL DATA:  Irregular heart beat. Swelling, shortness of breath, weakness.  EXAM: CHEST  2 VIEW  COMPARISON:  02/12/2010  FINDINGS: Cardiomegaly with vascular congestion. Lungs are clear. No effusions. Degenerative changes in the thoracic spine.  IMPRESSION: Cardiomegaly with vascular congestion.   Electronically Signed   By: Charlett Nose M.D.   On: 02/15/2013 13:19   Ir Fluoro Guide Cv Line Right  02/16/2013   *RADIOLOGY REPORT*  Clinical history:Malpositioned right arm PICC line.  PROCEDURE(S): EXCHANGE OF PICC LINE WITH FLUOROSCOPY  Physician: Rachelle Hora. Henn, MD  Medications:None  Moderate sedation time:None  Fluoroscopy time: 54 seconds  Procedure:The procedure was explained to the patient.  The risks and benefits of the procedure were discussed and the patient's questions were addressed.  Informed consent was obtained from the patient.   The existing right arm PICC line was prepped and draped in a sterile fashion.  Maximal barrier sterile technique was utilized including caps, mask, sterile gowns, sterile gloves, sterile drape, hand hygiene and skin antiseptic.  The catheter was pulled back  and cut.  Catheter was removed over a wire. A peel-away sheath was placed.  A new dual lumen Power PICC line was cut to 40 cm and advanced  through the peel-away sheath.  Catheter was positioned in the lower SVC.  Both lumens aspirated and flushed well.  Catheter was secured to the skin. Fluoroscopic images were taken and saved for this procedure.  Findings:The old catheter was extending into the right neck.  The new catheter tip is at the junction of the SVC and right atrium.  Complications: None  Impression:Successful exchange of the right arm PICC line with fluoroscopy.   Original Report Authenticated By: Richarda Overlie, M.D.   Dg Chest Port 1 View  02/16/2013   *RADIOLOGY REPORT*  Clinical Data: Right arm PICC  PORTABLE CHEST - 1 VIEW  Comparison: 02/15/2013  Findings: Pulmonary vascular congestion without frank interstitial edema. No pleural effusion or pneumothorax.  Cardiomegaly.  Right arm PICC courses into the right neck and terminates above the level of the imaged.  IMPRESSION: Malpositioned right arm PICC courses into the right neck. Withdrawal/adjustment is suggested.  These results will be called to the ordering clinician or representative by the Radiologist Assistant, and communication documented in the PACS Dashboard.   Original Report Authenticated By: Charline Bills, M.D.   TTE 02/16/2013 Left ventricle: Diffuse hypokinesis worse in the inferior wall The cavity size was normal. Wall thickness was normal. Systolic function was moderately to severely reduced. The estimated ejection fraction was in the range of 30% to 35%. Diffuse hypokinesis. - Left atrium: The atrium was moderately dilated. - Right atrium: The atrium was moderately dilated. - Atrial septum: No defect or patent foramen ovale was identified.   ASSESSMENT AND PLAN  A 67 yo with SOB, weakness, edema. Found to be in rapid afib of unknown duration.   1. Atrial fibrillation of unknown duration, now rate controlled  - change  Cardizem to Amiodarone PO, start loading - continue metoprolol - continue Heparin drip  - plan for possible TEE tomorrow,  as she has moderately dilated left atrium, moderate LV dysfunction and high filling pressures, we will give her another day of diuresis to assure she stays in SR  2. CHF - possibly tachycardia induced, considering her breast cancer history, however her regional wall motion abnormalities suggest possible CAD - continue Lasix iv  - pharmacologic nuclear stress test  - check lytes, replace potassium PRN  3. Lower extremity pain/swelling/erytema - pain most probably secondary to significant swelling. Venous Duplex is pending.   Signed,  Lars Masson MD

## 2013-02-17 NOTE — Progress Notes (Addendum)
*  PRELIMINARY RESULTS* Vascular Ultrasound Lower extremity venous duplex has been completed.  Preliminary findings: negative for DVT. Right Baker's cyst noted. Multiple varicose veins noted in bilateral calves.    Farrel Demark, RDMS, RVT  02/17/2013, 12:25 PM

## 2013-02-17 NOTE — Progress Notes (Signed)
Set up CHF video 102 and Afib video 101. Answered patient questions and reviewed Heart Failure packet.

## 2013-02-17 NOTE — Progress Notes (Signed)
D/c Cardizem drip. Pt started on PO amiodarone. Continuing to teach pt about new diagnosis of CHF using HF book and exit care notes with teachback method.

## 2013-02-17 NOTE — Progress Notes (Signed)
Spoke with Mrs Ayon at bedside to explain [facility] Care Management services. She pleasantly declined. Left brochure at bedside for her to call in case she changes her mind in the future.  Raiford Noble, MSN- Ed, RN,BSN- [facility] [phone]

## 2013-02-18 ENCOUNTER — Encounter (HOSPITAL_COMMUNITY): Payer: Self-pay | Admitting: Certified Registered Nurse Anesthetist

## 2013-02-18 ENCOUNTER — Inpatient Hospital Stay (HOSPITAL_COMMUNITY): Payer: Medicare Other | Admitting: Certified Registered Nurse Anesthetist

## 2013-02-18 ENCOUNTER — Inpatient Hospital Stay (HOSPITAL_COMMUNITY): Payer: Medicare Other

## 2013-02-18 ENCOUNTER — Encounter (HOSPITAL_COMMUNITY)
Admission: EM | Disposition: A | Discharge status: Home / Self Care | Payer: Self-pay | Source: Home / Self Care | Attending: Internal Medicine

## 2013-02-18 DIAGNOSIS — I4891 Unspecified atrial fibrillation: Secondary | ICD-10-CM

## 2013-02-18 DIAGNOSIS — I5041 Acute combined systolic (congestive) and diastolic (congestive) heart failure: Principal | ICD-10-CM

## 2013-02-18 HISTORY — PX: TEE WITHOUT CARDIOVERSION: SHX5443

## 2013-02-18 HISTORY — PX: CARDIOVERSION: SHX1299

## 2013-02-18 LAB — BASIC METABOLIC PANEL
BUN: 17 mg/dL (ref 6–23)
CO2: 30 mEq/L (ref 19–32)
Calcium: 8 mg/dL — ABNORMAL LOW (ref 8.4–10.5)
Chloride: 100 mEq/L (ref 96–112)
Creatinine, Ser: 0.84 mg/dL (ref 0.50–1.10)
GFR calc Af Amer: 82 mL/min — ABNORMAL LOW (ref >90)
GFR calc non Af Amer: 71 mL/min — ABNORMAL LOW (ref >90)
Glucose, Bld: 89 mg/dL (ref 70–99)
Potassium: 3.4 mEq/L — ABNORMAL LOW (ref 3.5–5.1)
Sodium: 141 mEq/L (ref 135–145)

## 2013-02-18 SURGERY — ECHOCARDIOGRAM, TRANSESOPHAGEAL
Anesthesia: General

## 2013-02-18 MED ORDER — AMIODARONE LOAD VIA INFUSION
150.0000 mg | Freq: Once | INTRAVENOUS | Status: AC
  Administered 2013-02-18: 150 mg via INTRAVENOUS
  Filled 2013-02-18: qty 83.34

## 2013-02-18 MED ORDER — MIDAZOLAM HCL 5 MG/ML IJ SOLN
INTRAMUSCULAR | Status: AC
  Filled 2013-02-18: qty 1

## 2013-02-18 MED ORDER — AMIODARONE HCL IN DEXTROSE 360-4.14 MG/200ML-% IV SOLN
60.0000 mg/h | INTRAVENOUS | Status: AC
  Administered 2013-02-18 – 2013-02-21 (×11): 60 mg/h via INTRAVENOUS
  Filled 2013-02-18 (×22): qty 200

## 2013-02-18 MED ORDER — LIDOCAINE VISCOUS 2 % MT SOLN
OROMUCOSAL | Status: DC | PRN
  Administered 2013-02-18: 15 mL via OROMUCOSAL

## 2013-02-18 MED ORDER — FENTANYL CITRATE 0.05 MG/ML IJ SOLN
INTRAMUSCULAR | Status: DC | PRN
  Administered 2013-02-18 (×2): 25 ug via INTRAVENOUS

## 2013-02-18 MED ORDER — SODIUM CHLORIDE 0.9 % IV SOLN
INTRAVENOUS | Status: DC | PRN
  Administered 2013-02-18: 12:00:00 via INTRAVENOUS

## 2013-02-18 MED ORDER — MIDAZOLAM HCL 10 MG/2ML IJ SOLN
INTRAMUSCULAR | Status: DC | PRN
  Administered 2013-02-18: 1 mg via INTRAVENOUS
  Administered 2013-02-18: 2 mg via INTRAVENOUS
  Administered 2013-02-18: 1 mg via INTRAVENOUS

## 2013-02-18 MED ORDER — AMIODARONE IV BOLUS ONLY 150 MG/100ML: 150.0000 mg | Freq: Once | INTRAVENOUS | Status: DC

## 2013-02-18 MED ORDER — AMIODARONE HCL IN DEXTROSE 360-4.14 MG/200ML-% IV SOLN: 60.0000 mg/h | INTRAVENOUS | Status: DC

## 2013-02-18 MED ORDER — LIDOCAINE VISCOUS 2 % MT SOLN
OROMUCOSAL | Status: AC
  Filled 2013-02-18: qty 15

## 2013-02-18 MED ORDER — TECHNETIUM TC 99M SESTAMIBI GENERIC - CARDIOLITE
30.0000 | Freq: Once | INTRAVENOUS | Status: AC | PRN
  Administered 2013-02-18: 30 via INTRAVENOUS

## 2013-02-18 MED ORDER — PROPOFOL 10 MG/ML IV BOLUS
INTRAVENOUS | Status: DC | PRN
  Administered 2013-02-18: 65 mg via INTRAVENOUS

## 2013-02-18 MED ORDER — FENTANYL CITRATE 0.05 MG/ML IJ SOLN
INTRAMUSCULAR | Status: AC
  Filled 2013-02-18: qty 2

## 2013-02-18 MED ORDER — LIDOCAINE HCL (CARDIAC) 20 MG/ML IV SOLN
INTRAVENOUS | Status: DC | PRN
  Administered 2013-02-18: 40 mg via INTRAVENOUS

## 2013-02-18 NOTE — Preoperative (Signed)
Beta Blockers   Reason not to administer Beta Blockers:Not Applicable 

## 2013-02-18 NOTE — Anesthesia Preprocedure Evaluation (Signed)
Anesthesia Evaluation    Airway       Dental   Pulmonary shortness of breath,  breath sounds clear to auscultation        Cardiovascular +CHF + dysrhythmias Rhythm:Irregular Rate:Normal     Neuro/Psych    GI/Hepatic   Endo/Other  Morbid obesity  Renal/GU      Musculoskeletal   Abdominal   Peds  Hematology   Anesthesia Other Findings   Reproductive/Obstetrics                           Anesthesia Physical Anesthesia Plan  ASA: III  Anesthesia Plan: General   Post-op Pain Management:    Induction: Intravenous  Airway Management Planned: Mask  Additional Equipment:   Intra-op Plan:   Post-operative Plan:   Informed Consent: I have reviewed the patients History and Physical, chart, labs and discussed the procedure including the risks, benefits and alternatives for the proposed anesthesia with the patient or authorized representative who has indicated his/her understanding and acceptance.     Plan Discussed with: CRNA and Surgeon  Anesthesia Plan Comments:         Anesthesia Quick Evaluation

## 2013-02-18 NOTE — Progress Notes (Signed)
  Echocardiogram Echocardiogram Transesophageal has been performed.  Natasha Dickson, [facility] 02/18/2013, 11:51 AM

## 2013-02-18 NOTE — H&P (View-Only) (Signed)
Patient Name: Natasha Dickson Date of Encounter: 02/18/2013     Active Problems:   * No active hospital problems. *    SUBJECTIVE  The patient feels better, improved SOB  CURRENT MEDS . amiodarone  400 mg Oral BID  . aspirin EC  81 mg Oral Daily  . digoxin  0.25 mg Oral Daily  . furosemide  80 mg Intravenous Q12H  . metoprolol tartrate  25 mg Oral QID  . potassium chloride  40 mEq Oral BID  . regadenoson  0.4 mg Intravenous Once  . rivaroxaban  20 mg Oral Daily    OBJECTIVE  Filed Vitals:   02/17/13 1029 02/17/13 1418 02/17/13 2114 02/18/13 0517  BP: 118/74 120/63 119/53 114/72  Pulse: 61 70 84 72  Temp:  98.6 F (37 C) 98 F (36.7 C) 98.2 F (36.8 C)  TempSrc:  Oral Oral Oral  Resp:  18 18 20   Height:      Weight:    239 lb 14.4 oz (108.818 kg)  SpO2:  95% 97% 95%    Intake/Output Summary (Last 24 hours) at 02/18/13 1013 Last data filed at 02/18/13 0848  Gross per 24 hour  Intake 404.83 ml  Output   3175 ml  Net -2770.17 ml   Filed Weights   02/16/13 0703 02/17/13 0613 02/18/13 0517  Weight: 245 lb 1.6 oz (111.177 kg) 242 lb 1 oz (109.8 kg) 239 lb 14.4 oz (108.818 kg)    PHYSICAL EXAM  General: Obese , NAD  Head: Normocephalic, atraumatic, sclera non-icteric  Neck: Negative for carotid bruits. JVP elevated to below jaw  Lungs: Vey mild rales at the bases Heart: Irreg rate/rhythm. S1 S2. No murmurs, rubs, or gallops appreciated.  Abdomen: Obese, no hepatomegaly. No definite masses.  Msk: Strength and tone appears normal for age.  Extremities: warm, erythematous, 3+ edema bilaterally Distal pedal pulses are 2+ and equal bilaterally.  Neuro: Alert and oriented X 3. Moves all extremities spontaneously.  Psych: Responds to questions appropriately with a normal affect.   Accessory Clinical Findings  CBC  Recent Labs  02/15/13 1232 02/16/13 0520  WBC 7.6 6.7  HGB 14.8 12.7  HCT 45.7 39.3  MCV 90.7 89.7  PLT 168 178   Basic Metabolic  Panel  Recent Labs  81/19/14 0520 02/17/13 1340  NA 137 139  K 3.0* 3.4*  CL 102 101  CO2 22 28  GLUCOSE 106* 121*  BUN 26* 23  CREATININE 0.96 1.03  CALCIUM 8.4 8.1*   Liver Function Tests  Recent Labs  02/17/13 1340  AST 69*  ALT 72*  ALKPHOS 68  BILITOT 0.9  PROT 6.0  ALBUMIN 2.7*   No results found for this basename: LIPASE, AMYLASE,  in the last 72 hours Cardiac Enzymes No results found for this basename: CKTOTAL, CKMB, CKMBINDEX, TROPONINI,  in the last 72 hours BNP No components found with this basename: POCBNP,  D-Dimer No results found for this basename: DDIMER,  in the last 72 hours Hemoglobin A1C  Recent Labs  02/16/13 0520  HGBA1C 5.7*   Fasting Lipid Panel  Recent Labs  02/16/13 0520  CHOL 97  HDL 26*  LDLCALC 58  TRIG 63  CHOLHDL 3.7   Thyroid Function Tests  Recent Labs  02/15/13 2247  TSH 2.473    TELE  A fib, rate controlled 64-80/minute   Radiology/Studies  Dg Chest 2 View  02/15/2013   CLINICAL DATA:  Irregular heart beat. Swelling, shortness of breath,  weakness.  EXAM: CHEST  2 VIEW  COMPARISON:  02/12/2010  FINDINGS: Cardiomegaly with vascular congestion. Lungs are clear. No effusions. Degenerative changes in the thoracic spine.  IMPRESSION: Cardiomegaly with vascular congestion.   Electronically Signed   By: Charlett Nose M.D.   On: 02/15/2013 13:19   Ir Fluoro Guide Cv Line Right  02/16/2013   *RADIOLOGY REPORT*  Clinical history:Malpositioned right arm PICC line.  PROCEDURE(S): EXCHANGE OF PICC LINE WITH FLUOROSCOPY  Physician: Rachelle Hora. Henn, MD  Medications:None  Moderate sedation time:None  Fluoroscopy time: 54 seconds  Procedure:The procedure was explained to the patient.  The risks and benefits of the  Dg Chest Port 1 View   ASSESSMENT AND PLAN   A 67 yo with SOB, weakness, edema. Found to be in rapid afib of unknown duration.   1. Atrial fibrillation of unknown duration, now rate controlled  - continue loading  with Amiodarone PO  - continue metoprolol  - continue Heparin drip  - plan for possible TEE today - considering her LV dysfunction, moderately dilated left atrium and high filling pressures there is a high chance that she will return to a-fib, however this is the first attempt that we need to make as her heart failure might possibly be tachycardia mediated   2. CHF - possibly tachycardia induced, however her regional wall motion abnormalities suggest possible CAD. We should also consider CMP secondary to chemotherapy (h/o breast cancer)  - continue Lasix iv  - pharmacologic nuclear stress test  - check lytes, replace potassium PRN   3. Lower extremity pain/swelling/erytema -  Venous Duplex is negative, erythema is suspicious for cellulitis, however no leukocytosis or fevers, we will watch for now and assume it will improve with improved swelling.   Virgel Manifold, H MD 02/18/2013

## 2013-02-18 NOTE — CV Procedure (Addendum)
    TRANSESOPHAGEAL ECHOCARDIOGRAM GUIDED DIRECT CURRENT CARDIOVERSION  NAME:  Natasha Dickson   MRN: [id number] DOB:  Oct 18, 1945   ADMIT DATE: 02/15/2013  INDICATIONS: A FIB   PROCEDURE:   Informed consent was obtained prior to the procedure. The risks, benefits and alternatives for the procedure were discussed and the patient comprehended these risks.  Risks include, but are not limited to, cough, sore throat, vomiting, nausea, somnolence, esophageal and stomach trauma or perforation, bleeding, low blood pressure, aspiration, pneumonia, infection, trauma to the teeth and death.    After a procedural time-out, the patient was given 4 mg versed and 50 mcg fentanyl for moderate sedation.  The oropharynx was anesthetized 10 cc of topical 1% viscous lidocaine.  The transesophageal probe was inserted in the esophagus and stomach without difficulty and multiple views were obtained.    FINDINGS:  LEFT VENTRICLE: EF = ~30% global HK  RIGHT VENTRICLE: Normal  LEFT ATRIUM: Severely dilated 5.7 cm  LEFT ATRIAL APPENDAGE: No clot  RIGHT ATRIUM: Dilated  AORTIC VALVE:  Trileaflet. No AS/AI  MITRAL VALVE:   Normal morphology. Moderate central MR  TRICUSPID VALVE: Severe TR. Eccentric toward septum   PULMONIC VALVE: Normal. No PR  INTERATRIAL SEPTUM: Aneurysmal. Probable small PFO with weakly + bubble study  PERICARDIUM: No effusion  DESCENDING AORTA: Mild plaque   CARDIOVERSION:     Indications:  Atrial Fibrillation  Procedure Details:  Once the TEE was complete, the patient had the defibrillator pads placed in the anterior and posterior position. The patient then underwent further sedation with propofol 65mg  by the anesthesia service for cardioversion. Once an appropriate level of sedation was achieved, the patient received a single biphasic, synchronized 200J shock with prompt conversion to sinus rhythm. However after about 30 seconds on sinus rhythm she reverted to AF. No apparent  complications.  COMPLICATIONS:    There were no immediate complications.   CONCLUSION:   Patient only able to maintain SR for 30 seconds after DC-CV. Given atrial size consider rate control strategy vs. further amio load with repeat attempt at [facility].  Daniel Bensimhon,MD 11:46 AM

## 2013-02-18 NOTE — Progress Notes (Signed)
 Patient Name: Natasha Dickson Date of Encounter: 02/18/2013     Active Problems:   * No active hospital problems. *    SUBJECTIVE  The patient feels better, improved SOB  CURRENT MEDS . amiodarone  400 mg Oral BID  . aspirin EC  81 mg Oral Daily  . digoxin  0.25 mg Oral Daily  . furosemide  80 mg Intravenous Q12H  . metoprolol tartrate  25 mg Oral QID  . potassium chloride  40 mEq Oral BID  . regadenoson  0.4 mg Intravenous Once  . rivaroxaban  20 mg Oral Daily    OBJECTIVE  Filed Vitals:   02/17/13 1029 02/17/13 1418 02/17/13 2114 02/18/13 0517  BP: 118/74 120/63 119/53 114/72  Pulse: 61 70 84 72  Temp:  98.6 F (37 C) 98 F (36.7 C) 98.2 F (36.8 C)  TempSrc:  Oral Oral Oral  Resp:  18 18 20  Height:      Weight:    239 lb 14.4 oz (108.818 kg)  SpO2:  95% 97% 95%    Intake/Output Summary (Last 24 hours) at 02/18/13 1013 Last data filed at 02/18/13 0848  Gross per 24 hour  Intake 404.83 ml  Output   3175 ml  Net -2770.17 ml   Filed Weights   02/16/13 0703 02/17/13 0613 02/18/13 0517  Weight: 245 lb 1.6 oz (111.177 kg) 242 lb 1 oz (109.8 kg) 239 lb 14.4 oz (108.818 kg)    PHYSICAL EXAM  General: Obese , NAD  Head: Normocephalic, atraumatic, sclera non-icteric  Neck: Negative for carotid bruits. JVP elevated to below jaw  Lungs: Vey mild rales at the bases Heart: Irreg rate/rhythm. S1 S2. No murmurs, rubs, or gallops appreciated.  Abdomen: Obese, no hepatomegaly. No definite masses.  Msk: Strength and tone appears normal for age.  Extremities: warm, erythematous, 3+ edema bilaterally Distal pedal pulses are 2+ and equal bilaterally.  Neuro: Alert and oriented X 3. Moves all extremities spontaneously.  Psych: Responds to questions appropriately with a normal affect.   Accessory Clinical Findings  CBC  Recent Labs  02/15/13 1232 02/16/13 0520  WBC 7.6 6.7  HGB 14.8 12.7  HCT 45.7 39.3  MCV 90.7 89.7  PLT 168 178   Basic Metabolic  Panel  Recent Labs  02/16/13 0520 02/17/13 1340  NA 137 139  K 3.0* 3.4*  CL 102 101  CO2 22 28  GLUCOSE 106* 121*  BUN 26* 23  CREATININE 0.96 1.03  CALCIUM 8.4 8.1*   Liver Function Tests  Recent Labs  02/17/13 1340  AST 69*  ALT 72*  ALKPHOS 68  BILITOT 0.9  PROT 6.0  ALBUMIN 2.7*   No results found for this basename: LIPASE, AMYLASE,  in the last 72 hours Cardiac Enzymes No results found for this basename: CKTOTAL, CKMB, CKMBINDEX, TROPONINI,  in the last 72 hours BNP No components found with this basename: POCBNP,  D-Dimer No results found for this basename: DDIMER,  in the last 72 hours Hemoglobin A1C  Recent Labs  02/16/13 0520  HGBA1C 5.7*   Fasting Lipid Panel  Recent Labs  02/16/13 0520  CHOL 97  HDL 26*  LDLCALC 58  TRIG 63  CHOLHDL 3.7   Thyroid Function Tests  Recent Labs  02/15/13 2247  TSH 2.473    TELE  A fib, rate controlled 64-80/minute   Radiology/Studies  Dg Chest 2 View  02/15/2013   CLINICAL DATA:  Irregular heart beat. Swelling, shortness of breath,   weakness.  EXAM: CHEST  2 VIEW  COMPARISON:  02/12/2010  FINDINGS: Cardiomegaly with vascular congestion. Lungs are clear. No effusions. Degenerative changes in the thoracic spine.  IMPRESSION: Cardiomegaly with vascular congestion.   Electronically Signed   By: Kevin  Dover M.D.   On: 02/15/2013 13:19   Ir Fluoro Guide Cv Line Right  02/16/2013   *RADIOLOGY REPORT*  Clinical history:Malpositioned right arm PICC line.  PROCEDURE(S): EXCHANGE OF PICC LINE WITH FLUOROSCOPY  Physician: Adam R. Henn, MD  Medications:None  Moderate sedation time:None  Fluoroscopy time: 54 seconds  Procedure:The procedure was explained to the patient.  The risks and benefits of the  Dg Chest Port 1 View   ASSESSMENT AND PLAN   A 66 yo with SOB, weakness, edema. Found to be in rapid afib of unknown duration.   1. Atrial fibrillation of unknown duration, now rate controlled  - continue loading  with Amiodarone PO  - continue metoprolol  - continue Heparin drip  - plan for possible TEE today - considering her LV dysfunction, moderately dilated left atrium and high filling pressures there is a high chance that she will return to a-fib, however this is the first attempt that we need to make as her heart failure might possibly be tachycardia mediated   2. CHF - possibly tachycardia induced, however her regional wall motion abnormalities suggest possible CAD. We should also consider CMP secondary to chemotherapy (h/o breast cancer)  - continue Lasix iv  - pharmacologic nuclear stress test  - check lytes, replace potassium PRN   3. Lower extremity pain/swelling/erytema -  Venous Duplex is negative, erythema is suspicious for cellulitis, however no leukocytosis or fevers, we will watch for now and assume it will improve with improved swelling.   Signed, Siddiq Kaluzny, H MD 02/18/2013   

## 2013-02-18 NOTE — Transfer of Care (Signed)
Immediate Anesthesia Transfer of Care Note  Patient: Natasha Dickson  Procedure(s) Performed: Procedure(s): TRANSESOPHAGEAL ECHOCARDIOGRAM (TEE) (N/A) CARDIOVERSION (N/A)  Patient Location: PACU  Anesthesia Type:MAC  Level of Consciousness: awake, alert , sedated and patient cooperative  Airway & Oxygen Therapy: Patient Spontanous Breathing, connected to nasal cannula oxygen  Post-op Assessment: Report given to PACU RN and Post -op Vital signs reviewed and stable  Post vital signs: Reviewed and stable  Complications: No apparent anesthesia complications

## 2013-02-18 NOTE — Anesthesia Postprocedure Evaluation (Signed)
  Anesthesia Post-op Note  Patient: Natasha Dickson  Procedure(s) Performed: Procedure(s): TRANSESOPHAGEAL ECHOCARDIOGRAM (TEE) (N/A) CARDIOVERSION (N/A)  Patient Location: PACU  Anesthesia Type:General  Level of Consciousness: awake and alert   Airway and Oxygen Therapy: Patient Spontanous Breathing  Post-op Pain: none  Post-op Assessment: Post-op Vital signs reviewed  Post-op Vital Signs: stable  Complications: No apparent anesthesia complications

## 2013-02-18 NOTE — Progress Notes (Signed)
Pt went for TEE/cardioversion today, pt still in afib, amiodarone gtt started at 33.80ml/hr, pt stable, oob with assist x1, will continue to monitor

## 2013-02-18 NOTE — Anesthesia Procedure Notes (Signed)
Procedure Name: MAC Date/Time: 02/18/2013 11:55 AM Performed by: Angelica Pou Pre-anesthesia Checklist: Patient identified, Emergency Drugs available, Suction available, Patient being monitored and Timeout performed Patient Re-evaluated:Patient Re-evaluated prior to inductionOxygen Delivery Method: Circle system utilized Preoxygenation: Pre-oxygenation with 100% oxygen Intubation Type: IV induction Ventilation: Mask ventilation without difficulty

## 2013-02-18 NOTE — Progress Notes (Addendum)
11:17am -Spoke with RN regarding Venous Duplex reorder by Dr. Delton See. Study was negative 02/17/13. MD note does not indicate reason to repeat. Please advise if repeat is indicated. RN to check with MD today.  3:00pm- Spoke with RN again. She will page MD about venous order and call the vascular lab.

## 2013-02-18 NOTE — Interval H&P Note (Signed)
History and Physical Interval Note:  02/18/2013 11:22 AM  Natasha Dickson  has presented today for surgery, with the diagnosis of afib  The various methods of treatment have been discussed with the patient and family. After consideration of risks, benefits and other options for treatment, the patient has consented to  Procedure(s): TRANSESOPHAGEAL ECHOCARDIOGRAM (TEE) (N/A) CARDIOVERSION (N/A) as a surgical intervention .  The patient's history has been reviewed, patient examined, no change in status, stable for surgery.  I have reviewed the patient's chart and labs.  Questions were answered to the patient's satisfaction.     [name]

## 2013-02-19 ENCOUNTER — Inpatient Hospital Stay (HOSPITAL_COMMUNITY): Payer: Medicare Other

## 2013-02-19 ENCOUNTER — Encounter (HOSPITAL_COMMUNITY): Payer: Medicare Other

## 2013-02-19 ENCOUNTER — Encounter (HOSPITAL_COMMUNITY): Payer: Self-pay | Admitting: Internal Medicine

## 2013-02-19 DIAGNOSIS — I4891 Unspecified atrial fibrillation: Secondary | ICD-10-CM

## 2013-02-19 DIAGNOSIS — R0602 Shortness of breath: Secondary | ICD-10-CM

## 2013-02-19 DIAGNOSIS — I420 Dilated cardiomyopathy: Secondary | ICD-10-CM | POA: Insufficient documentation

## 2013-02-19 LAB — BASIC METABOLIC PANEL
BUN: 16 mg/dL (ref 6–23)
Chloride: 97 mEq/L (ref 96–112)
Creatinine, Ser: 0.86 mg/dL (ref 0.50–1.10)
GFR calc Af Amer: 80 mL/min — ABNORMAL LOW (ref >90)
Glucose, Bld: 105 mg/dL — ABNORMAL HIGH (ref 70–99)
Potassium: 3.5 mEq/L (ref 3.5–5.1)
Sodium: 138 mEq/L (ref 135–145)

## 2013-02-19 MED ORDER — TECHNETIUM TC 99M SESTAMIBI GENERIC - CARDIOLITE
30.0000 | Freq: Once | INTRAVENOUS | Status: AC | PRN
  Administered 2013-02-19: 30 via INTRAVENOUS

## 2013-02-19 MED ORDER — MAGNESIUM HYDROXIDE 400 MG/5ML PO SUSP
30.0000 mL | Freq: Every day | ORAL | Status: DC | PRN
  Filled 2013-02-19: qty 30

## 2013-02-19 NOTE — Progress Notes (Signed)
The patient was seen, evaluated and discussed with Ward Givens, NP. I agree with the above.

## 2013-02-19 NOTE — Progress Notes (Signed)
   Patient Name: Natasha Dickson Date of Encounter: 02/19/2013   Principal Problem:   Acute combined systolic and diastolic heart failure Active Problems:   A-fib   Congestive dilated cardiomyopathy   SUBJECTIVE  Breathing improved.  Remains in afib and on amio after failed dccv yesterday.  CURRENT MEDS . aspirin EC  81 mg Oral Daily  . digoxin  0.25 mg Oral Daily  . furosemide  80 mg Intravenous Q12H  . metoprolol tartrate  25 mg Oral QID  . potassium chloride  40 mEq Oral BID  . regadenoson  0.4 mg Intravenous Once  . rivaroxaban  20 mg Oral Daily    OBJECTIVE  Filed Vitals:   02/18/13 1310 02/18/13 1530 02/18/13 2034 02/19/13 0559  BP: 99/72 89/55 127/66 118/54  Pulse: 76 79 93 84  Temp:  97.6 F (36.4 C) 98.1 F (36.7 C) 97 F (36.1 C)  TempSrc:  Oral Oral Oral  Resp: 19 19 18 20   Height:      Weight:    231 lb 9.6 oz (105.053 kg)  SpO2: 98% 98% 98% 96%    Intake/Output Summary (Last 24 hours) at 02/19/13 0912 Last data filed at 02/19/13 0700  Gross per 24 hour  Intake 1469.09 ml  Output   1755 ml  Net -285.91 ml   Filed Weights   02/17/13 0613 02/18/13 0517 02/19/13 0559  Weight: 242 lb 1 oz (109.8 kg) 239 lb 14.4 oz (108.818 kg) 231 lb 9.6 oz (105.053 kg)    PHYSICAL EXAM  General: Pleasant, NAD. Neuro: Alert and oriented X 3. Moves all extremities spontaneously. Psych: Normal affect. HEENT:  Normal  Neck: Supple without bruits.  Mod elev neck veins. Lungs:  Resp regular and unlabored, bibasilar crackles. Heart: ir, ir, no s3, s4, or murmurs. Abdomen: Soft, non-tender, non-distended, BS + x 4.  Extremities: No clubbing, cyanosis. 3+ bilat LEE with erythema.  DP/PT/Radials 2+ and equal bilaterally.  Accessory Clinical Findings  Basic Metabolic Panel  Recent Labs  02/17/13 1340 02/18/13 1720  NA 139 141  K 3.4* 3.4*  CL 101 100  CO2 28 30  GLUCOSE 121* 89  BUN 23 17  CREATININE 1.03 0.84  CALCIUM 8.1* 8.0*   Liver Function  Tests  Recent Labs  02/17/13 1340  AST 69*  ALT 72*  ALKPHOS 68  BILITOT 0.9  PROT 6.0  ALBUMIN 2.7*   TELE  Seen in nuc med.  Remains in afib.  ASSESSMENT AND PLAN  1.  Afib RVR:  S/p TEE and attempted DCCV yesterday. Pt was unable to maintain sinus rhythm.  She is now on amio infusion and remains in rate controlled afib.  Plan for repeat DCCV after sufficient amio load (? 5G).  Cont xarelto.  2.  New Cardiomyopathy:  ? Isch/nonisch.  No chest pain.  EF ~ 30 with glob HK by TEE yesterday. She continues to exhibit volume overload.  Cont IV lasix.  Cont bb/digoxin.  Plan to consolidate to toprol xl @ some point.  No acei/arb in setting of intermittent hypotension.  Myoview today to r/o ischemia.  3.  Hypokalemia:  F/u this AM.  She is on bid potassium supp.  Signed, Nicolasa Ducking NP

## 2013-02-19 NOTE — Progress Notes (Signed)
Utilization Review Completed Akeia Perot J. Josephus Harriger, RN, BSN, NCM [phone]  

## 2013-02-19 NOTE — Progress Notes (Signed)
Pt a/o, no c/o pain, pt oob with supervision, pt c/o constipation but does not want to take anything at this time, MOM ordered PRN

## 2013-02-19 NOTE — Progress Notes (Signed)
02/18/13 Pt.is A/Ox4 and is one person assist to the [facility]. She is on room air. She had no c/o pain during the shift and remains on her Amiodarone drip. She has double lumen PICC to her right upper arm. She has remained NPO since midnight for tests in the am.

## 2013-02-20 DIAGNOSIS — I428 Other cardiomyopathies: Secondary | ICD-10-CM

## 2013-02-20 DIAGNOSIS — I5041 Acute combined systolic (congestive) and diastolic (congestive) heart failure: Principal | ICD-10-CM

## 2013-02-20 DIAGNOSIS — R609 Edema, unspecified: Secondary | ICD-10-CM

## 2013-02-20 LAB — BASIC METABOLIC PANEL
BUN: 16 mg/dL (ref 6–23)
CO2: 33 mEq/L — ABNORMAL HIGH (ref 19–32)
Calcium: 8.4 mg/dL (ref 8.4–10.5)
Chloride: 96 mEq/L (ref 96–112)
Creatinine, Ser: 0.94 mg/dL (ref 0.50–1.10)
Glucose, Bld: 110 mg/dL — ABNORMAL HIGH (ref 70–99)

## 2013-02-20 NOTE — Progress Notes (Signed)
Patient alert, oriented X 4, no complaints of pain or discomfort, moderate, slight pitting, edema in both lower extremities, lung sounds  Diminished but clear except for mild expiratory wheezes in rt. Lower Lung, BM this a.m.

## 2013-02-20 NOTE — Progress Notes (Signed)
SUBJECTIVE: Patient denies chest pain and says breathing is much better, and leg swelling is gradually decreasing.     Intake/Output Summary (Last 24 hours) at 02/20/13 0909 Last data filed at 02/20/13 0700  Gross per 24 hour  Intake 1501.21 ml  Output   3928 ml  Net -2426.79 ml    Current Facility-Administered Medications  Medication Dose Route Frequency Provider Last Rate Last Dose  . acetaminophen (TYLENOL) tablet 650 mg  650 mg Oral Q4H PRN Pricilla Riffle, MD   650 mg at 02/17/13 1610  . amiodarone (NEXTERONE PREMIX) 360 mg/200 mL dextrose IV infusion  60 mg/hr Intravenous Continuous Lars Masson, MD 33.3 mL/hr at 02/20/13 0740 60 mg/hr at 02/20/13 0740  . aspirin EC tablet 81 mg  81 mg Oral Daily Pricilla Riffle, MD   81 mg at 02/19/13 1037  . digoxin (LANOXIN) tablet 0.25 mg  0.25 mg Oral Daily Pricilla Riffle, MD   0.25 mg at 02/19/13 1037  . furosemide (LASIX) injection 80 mg  80 mg Intravenous Q12H Pricilla Riffle, MD   80 mg at 02/20/13 0856  . magnesium hydroxide (MILK OF MAGNESIA) suspension 30 mL  30 mL Oral Daily PRN Pricilla Riffle, MD      . metoprolol tartrate (LOPRESSOR) tablet 25 mg  25 mg Oral QID Pricilla Riffle, MD   25 mg at 02/19/13 2237  . nitroGLYCERIN (NITROSTAT) SL tablet 0.4 mg  0.4 mg Sublingual Q5 Min x 3 PRN Pricilla Riffle, MD      . ondansetron [facility]) injection 4 mg  4 mg Intravenous Q6H PRN Pricilla Riffle, MD      . potassium chloride SA (K-DUR,KLOR-CON) CR tablet 40 mEq  40 mEq Oral BID Ok Anis, NP   40 mEq at 02/19/13 2238  . Rivaroxaban (XARELTO) tablet 20 mg  20 mg Oral Daily Roger A Arguello, PA-C   20 mg at 02/19/13 1037  . sodium chloride 0.9 % injection 10-40 mL  10-40 mL Intracatheter PRN Pricilla Riffle, MD   10 mL at 02/19/13 2116    Filed Vitals:   02/19/13 1442 02/19/13 1951 02/20/13 0104 02/20/13 0502  BP: 127/59 131/88 135/64 105/83  Pulse: 79 77 76 87  Temp: 97.6 F (36.4 C) 98.3 F (36.8 C)  98.2 F (36.8 C)  TempSrc: Oral Oral   Oral  Resp: 18 18 18 18   Height:      Weight:    229 lb 3.2 oz (103.964 kg)  SpO2: 98% 98% 98% 98%    PHYSICAL EXAM General: NAD Neck: No JVD, no thyromegaly or thyroid nodule.  Lungs: Faint bibasilar rales. CV: Nondisplaced PMI.  Heart irregular rhythm but normal rate, with normal S1/S2, no S3/S4, no murmur.  3+ pitting leg edema b/l.  No carotid bruit.  Normal pedal pulses.  Abdomen: obese, Soft, nontender. Neurologic: Alert and oriented x 3.  Psych: Normal affect. Extremities: No clubbing or cyanosis.   TELEMETRY: Reviewed telemetry pt in rate-controlled atrial fibrillation.  LABS: Basic Metabolic Panel:  Recent Labs  96/04/54 1000 02/20/13 0427  NA 138 138  K 3.5 3.7  CL 97 96  CO2 32 33*  GLUCOSE 105* 110*  BUN 16 16  CREATININE 0.86 0.94  CALCIUM 8.2* 8.4   Liver Function Tests:  Recent Labs  02/17/13 1340  AST 69*  ALT 72*  ALKPHOS 68  BILITOT 0.9  PROT 6.0  ALBUMIN 2.7*   No results  found for this basename: LIPASE, AMYLASE,  in the last 72 hours CBC: No results found for this basename: WBC, NEUTROABS, HGB, HCT, MCV, PLT,  in the last 72 hours Cardiac Enzymes: No results found for this basename: CKTOTAL, CKMB, CKMBINDEX, TROPONINI,  in the last 72 hours BNP: No components found with this basename: POCBNP,  D-Dimer: No results found for this basename: DDIMER,  in the last 72 hours Hemoglobin A1C: No results found for this basename: HGBA1C,  in the last 72 hours Fasting Lipid Panel: No results found for this basename: CHOL, HDL, LDLCALC, TRIG, CHOLHDL, LDLDIRECT,  in the last 72 hours Thyroid Function Tests: No results found for this basename: TSH, T4TOTAL, FREET3, T3FREE, THYROIDAB,  in the last 72 hours Anemia Panel: No results found for this basename: VITAMINB12, FOLATE, FERRITIN, TIBC, IRON, RETICCTPCT,  in the last 72 hours  RADIOLOGY: Dg Chest 2 View  02/15/2013   CLINICAL DATA:  Irregular heart beat. Swelling, shortness of breath,  weakness.  EXAM: CHEST  2 VIEW  COMPARISON:  02/12/2010  FINDINGS: Cardiomegaly with vascular congestion. Lungs are clear. No effusions. Degenerative changes in the thoracic spine.  IMPRESSION: Cardiomegaly with vascular congestion.   Electronically Signed   By: Charlett Nose M.D.   On: 02/15/2013 13:19   Nm Myocar Multi W/spect W/wall Motion / Ef  02/19/2013   Identification:  The patient is a 67 year old with history of atrial fibrillation and  shortness of breath.  Test to evaluate rule out ischemia  Stress data:  The patient underwent Lexiscan stress testing per protocol.  Baseline EKG showed atrial fibrillation 95 beats per minute, T-wave inversion inferolaterally.  Baseline blood pressure 108/71.  With infusion of Lexiscan, EKG showed no significant ST changes to suggest ischemia.  Overall electrically negative.  Nuclear data:  The patient was studied in a 2-day rest/stress protocol.  She was injected with 30 mCi technetium 99 labeled sestamibi at rest, 30 mCi technetium 99 labeled sestamibi at stress.  Images were reconstructed in the short, vertical, horizontal axes.  On review of the raw data, soft tissue (diaphragm, bowel, breast) surround the heart.   In the initial stress images there is thinning with decreased counts noted in the mid/distal anterior wall.  There is also mild thinning noted in the inferolateral wall (base).  In the recovery images there was no significant change noted from the stress images.  On gating LVEF was calculated at 40% with normal thickening noted in the anterior wall, hypokinesis inferiorly.  Impression: Lexiscan Sestamibi.  Electrically negative for ischemia.  Sestamibi scan shows anterior thinning consistent with probable soft tissue attenuation(breast) cannot rule-out subendocardial scar.  Basal inferolateral thinning consistent with possible soft tissue attenuation versus subendocardial scar.  No evidence for significant ischemia.   LVEF on gating calculated at 40% with  wall motion changes as noted.  Consider echo  to further evaluate systolic function/wall motion.  Overall low risk scan.   Original Report Authenticated By: Dietrich Pates   Ir Fluoro Guide Cv Line Right  02/16/2013   *RADIOLOGY REPORT*  Clinical history:Malpositioned right arm PICC line.  PROCEDURE(S): EXCHANGE OF PICC LINE WITH FLUOROSCOPY  Physician: Rachelle Hora. Henn, MD  Medications:None  Moderate sedation time:None  Fluoroscopy time: 54 seconds  Procedure:The procedure was explained to the patient.  The risks and benefits of the procedure were discussed and the patient's questions were addressed.  Informed consent was obtained from the patient.   The existing right arm PICC line was prepped and draped  in a sterile fashion.  Maximal barrier sterile technique was utilized including caps, mask, sterile gowns, sterile gloves, sterile drape, hand hygiene and skin antiseptic.  The catheter was pulled back and cut.  Catheter was removed over a wire. A peel-away sheath was placed.  A new dual lumen Power PICC line was cut to 40 cm and advanced  through the peel-away sheath.  Catheter was positioned in the lower SVC.  Both lumens aspirated and flushed well.  Catheter was secured to the skin. Fluoroscopic images were taken and saved for this procedure.  Findings:The old catheter was extending into the right neck.  The new catheter tip is at the junction of the SVC and right atrium.  Complications: None  Impression:Successful exchange of the right arm PICC line with fluoroscopy.   Original Report Authenticated By: Richarda Overlie, M.D.   Dg Chest Port 1 View  02/16/2013   *RADIOLOGY REPORT*  Clinical Data: Right arm PICC  PORTABLE CHEST - 1 VIEW  Comparison: 02/15/2013  Findings: Pulmonary vascular congestion without frank interstitial edema. No pleural effusion or pneumothorax.  Cardiomegaly.  Right arm PICC courses into the right neck and terminates above the level of the imaged.  IMPRESSION: Malpositioned right arm PICC courses  into the right neck. Withdrawal/adjustment is suggested.  These results will be called to the ordering clinician or representative by the Radiologist Assistant, and communication documented in the PACS Dashboard.   Original Report Authenticated By: Charline Bills, M.D.      ASSESSMENT AND PLAN: 1. Afib RVR: S/p TEE and attempted DCCV on 02/18/13. Pt was unable to maintain sinus rhythm for longer than 30 seconds. Her left atrium was noted to be severely dilated at 5.7 cm, thus she will unlikely be able to be successfully cardioverted. She remains on an amiodaribe infusion and remains in rate controlled afib. According to prior notes, consideration is being given for a repeat DCCV after sufficient amiodarone loading. Cont xarelto, digoxin, and metoprolol (25 mg qid currently).  2. New Cardiomyopathy: Given the nuclear MPI results, this is likely of nonischemic etiology. EF ~ 30 with glob HK by TEE on 02/18/13. She continues to exhibit volume overload, but has good urinary output. Cont IV lasix 80 mg bid. Cont bb/digoxin. Plan to consolidate to toprol xl @ some point. No acei/arb in setting of intermittent hypotension.  3. Hypokalemia: normal today. She is on bid potassium supplementation.       Prentice Docker, M.D., F.A.C.C.

## 2013-02-21 LAB — BASIC METABOLIC PANEL
BUN: 16 mg/dL (ref 6–23)
CO2: 31 mEq/L (ref 19–32)
Calcium: 9.1 mg/dL (ref 8.4–10.5)
Creatinine, Ser: 0.95 mg/dL (ref 0.50–1.10)
GFR calc non Af Amer: 61 mL/min — ABNORMAL LOW (ref >90)
Glucose, Bld: 135 mg/dL — ABNORMAL HIGH (ref 70–99)

## 2013-02-21 MED ORDER — AMIODARONE HCL IN DEXTROSE 360-4.14 MG/200ML-% IV SOLN
60.0000 mg/h | INTRAVENOUS | Status: AC
  Administered 2013-02-21: 60 mg/h via INTRAVENOUS
  Filled 2013-02-21 (×2): qty 200

## 2013-02-21 NOTE — Progress Notes (Signed)
Sitting on side of bed, edema in lower legs have diminished.

## 2013-02-21 NOTE — Progress Notes (Signed)
SUBJECTIVE: Pt feels well, denying chest pain, shortness of breath, and palpitations. Says leg swelling has decreased considerably.     Intake/Output Summary (Last 24 hours) at 02/21/13 1052 Last data filed at 02/21/13 1040  Gross per 24 hour  Intake 2099.21 ml  Output   6003 ml  Net -3903.79 ml    Current Facility-Administered Medications  Medication Dose Route Frequency Provider Last Rate Last Dose  . acetaminophen (TYLENOL) tablet 650 mg  650 mg Oral Q4H PRN Pricilla Riffle, MD   650 mg at 02/17/13 8119  . amiodarone (NEXTERONE PREMIX) 360 mg/200 mL dextrose IV infusion  60 mg/hr Intravenous Continuous Lars Masson, MD 33.3 mL/hr at 02/21/13 0921 60 mg/hr at 02/21/13 0921  . aspirin EC tablet 81 mg  81 mg Oral Daily Pricilla Riffle, MD   81 mg at 02/20/13 1119  . digoxin (LANOXIN) tablet 0.25 mg  0.25 mg Oral Daily Pricilla Riffle, MD   0.25 mg at 02/20/13 1118  . furosemide (LASIX) injection 80 mg  80 mg Intravenous Q12H Pricilla Riffle, MD   80 mg at 02/21/13 0752  . magnesium hydroxide (MILK OF MAGNESIA) suspension 30 mL  30 mL Oral Daily PRN Pricilla Riffle, MD      . metoprolol tartrate (LOPRESSOR) tablet 25 mg  25 mg Oral QID Pricilla Riffle, MD   25 mg at 02/20/13 2146  . nitroGLYCERIN (NITROSTAT) SL tablet 0.4 mg  0.4 mg Sublingual Q5 Min x 3 PRN Pricilla Riffle, MD      . ondansetron [facility]) injection 4 mg  4 mg Intravenous Q6H PRN Pricilla Riffle, MD      . potassium chloride SA (K-DUR,KLOR-CON) CR tablet 40 mEq  40 mEq Oral BID Ok Anis, NP   40 mEq at 02/20/13 2146  . Rivaroxaban (XARELTO) tablet 20 mg  20 mg Oral Daily Roger A Arguello, PA-C   20 mg at 02/20/13 1118  . sodium chloride 0.9 % injection 10-40 mL  10-40 mL Intracatheter PRN Pricilla Riffle, MD   10 mL at 02/19/13 2116    Filed Vitals:   02/20/13 1443 02/20/13 1744 02/20/13 2012 02/21/13 0520  BP: 114/68 114/64 121/75 90/56  Pulse: 80 84 89 70  Temp:   98.1 F (36.7 C) 98.4 F (36.9 C)  TempSrc:   Oral Oral   Resp:   18 18  Height:      Weight:    223 lb 11.2 oz (101.47 kg)  SpO2:   95% 93%    PHYSICAL EXAM General: NAD  Neck: No JVD, no thyromegaly or thyroid nodule.  Lungs: Faint bibasilar rales.  CV: Nondisplaced PMI. Heart irregular rhythm but normal rate, with normal S1/S2, no S3/S4, no murmur. 2+ pitting leg edema b/l with mild erythema. No carotid bruit. Normal pedal pulses.  Abdomen: obese, Soft, nontender.  Neurologic: Alert and oriented x 3.  Psych: Normal affect.  Extremities: No clubbing or cyanosis.   TELEMETRY: Reviewed telemetry pt in rate-controlled atrial fibrillation.  LABS: Basic Metabolic Panel:  Recent Labs  14/78/29 1000 02/20/13 0427  NA 138 138  K 3.5 3.7  CL 97 96  CO2 32 33*  GLUCOSE 105* 110*  BUN 16 16  CREATININE 0.86 0.94  CALCIUM 8.2* 8.4   Liver Function Tests: No results found for this basename: AST, ALT, ALKPHOS, BILITOT, PROT, ALBUMIN,  in the last 72 hours No results found for this basename: LIPASE, AMYLASE,  in the last 72 hours CBC: No results found for this basename: WBC, NEUTROABS, HGB, HCT, MCV, PLT,  in the last 72 hours Cardiac Enzymes: No results found for this basename: CKTOTAL, CKMB, CKMBINDEX, TROPONINI,  in the last 72 hours BNP: No components found with this basename: POCBNP,  D-Dimer: No results found for this basename: DDIMER,  in the last 72 hours Hemoglobin A1C: No results found for this basename: HGBA1C,  in the last 72 hours Fasting Lipid Panel: No results found for this basename: CHOL, HDL, LDLCALC, TRIG, CHOLHDL, LDLDIRECT,  in the last 72 hours Thyroid Function Tests: No results found for this basename: TSH, T4TOTAL, FREET3, T3FREE, THYROIDAB,  in the last 72 hours Anemia Panel: No results found for this basename: VITAMINB12, FOLATE, FERRITIN, TIBC, IRON, RETICCTPCT,  in the last 72 hours  RADIOLOGY:   Nm Myocar Multi W/spect W/wall Motion / Ef  02/19/2013   Identification:  The patient is a 67 year old  with history of atrial fibrillation and  shortness of breath.  Test to evaluate rule out ischemia  Stress data:  The patient underwent Lexiscan stress testing per protocol.  Baseline EKG showed atrial fibrillation 95 beats per minute, T-wave inversion inferolaterally.  Baseline blood pressure 108/71.  With infusion of Lexiscan, EKG showed no significant ST changes to suggest ischemia.  Overall electrically negative.  Nuclear data:  The patient was studied in a 2-day rest/stress protocol.  She was injected with 30 mCi technetium 99 labeled sestamibi at rest, 30 mCi technetium 99 labeled sestamibi at stress.  Images were reconstructed in the short, vertical, horizontal axes.  On review of the raw data, soft tissue (diaphragm, bowel, breast) surround the heart.   In the initial stress images there is thinning with decreased counts noted in the mid/distal anterior wall.  There is also mild thinning noted in the inferolateral wall (base).  In the recovery images there was no significant change noted from the stress images.  On gating LVEF was calculated at 40% with normal thickening noted in the anterior wall, hypokinesis inferiorly.  Impression: Lexiscan Sestamibi.  Electrically negative for ischemia.  Sestamibi scan shows anterior thinning consistent with probable soft tissue attenuation(breast) cannot rule-out subendocardial scar.  Basal inferolateral thinning consistent with possible soft tissue attenuation versus subendocardial scar.  No evidence for significant ischemia.   LVEF on gating calculated at 40% with wall motion changes as noted.  Consider echo  to further evaluate systolic function/wall motion.  Overall low risk scan.   Original Report Authenticated By: Dietrich Pates        ASSESSMENT AND PLAN: 1. Afib RVR: S/p TEE and attempted DCCV on 02/18/13. Pt was unable to maintain sinus rhythm for longer than 30 seconds. Her left atrium was noted to be severely dilated at 5.7 cm, thus she will unlikely be  able to be successfully cardioverted. She remains on an amiodaribe infusion and remains in rate controlled afib. According to prior notes, consideration is being given for a repeat DCCV after sufficient amiodarone loading. Cont xarelto, digoxin, and metoprolol (25 mg qid currently).  2. New Cardiomyopathy: Given the nuclear MPI results, this is likely of nonischemic etiology. EF ~ 30 with glob HK by TEE on 02/18/13. She continues to exhibit volume overload but her lower extremity edema has improved, and has good urinary output. Cont IV lasix 80 mg bid and check BMP today. Cont bb/digoxin. Plan to consolidate to toprol xl @ some point. No acei/arb in setting of intermittent hypotension.  3. Hypokalemia:  check today. She is on bid potassium supplementation.     Prentice Docker, M.D., F.A.C.C.

## 2013-02-22 ENCOUNTER — Inpatient Hospital Stay (HOSPITAL_COMMUNITY): Payer: Medicare Other | Admitting: Anesthesiology

## 2013-02-22 ENCOUNTER — Encounter (HOSPITAL_COMMUNITY): Payer: Self-pay | Admitting: Anesthesiology

## 2013-02-22 ENCOUNTER — Encounter (HOSPITAL_COMMUNITY)
Admission: EM | Disposition: A | Discharge status: Home / Self Care | Payer: Self-pay | Source: Home / Self Care | Attending: Internal Medicine

## 2013-02-22 DIAGNOSIS — I4891 Unspecified atrial fibrillation: Secondary | ICD-10-CM

## 2013-02-22 HISTORY — PX: CARDIOVERSION: SHX1299

## 2013-02-22 LAB — CBC WITH DIFFERENTIAL/PLATELET
Basophils Absolute: 0 10*3/uL (ref 0.0–0.1)
Basophils Relative: 1 % (ref 0–1)
Eosinophils Absolute: 0.3 10*3/uL (ref 0.0–0.7)
Hemoglobin: 13.7 g/dL (ref 12.0–15.0)
MCH: 29.1 pg (ref 26.0–34.0)
MCHC: 31.9 g/dL (ref 30.0–36.0)
Monocytes Relative: 10 % (ref 3–12)
Neutrophils Relative %: 55 % (ref 43–77)
RDW: 16.6 % — ABNORMAL HIGH (ref 11.5–15.5)

## 2013-02-22 LAB — BASIC METABOLIC PANEL
BUN: 16 mg/dL (ref 6–23)
BUN: 14 mg/dL (ref 6–23)
CO2: 31 mEq/L (ref 19–32)
Calcium: 9 mg/dL (ref 8.4–10.5)
Calcium: 9.4 mg/dL (ref 8.4–10.5)
Chloride: 96 mEq/L (ref 96–112)
Creatinine, Ser: 0.96 mg/dL (ref 0.50–1.10)
Creatinine, Ser: 0.91 mg/dL (ref 0.50–1.10)
GFR calc Af Amer: 70 mL/min — ABNORMAL LOW (ref >90)
GFR calc Af Amer: 75 mL/min — ABNORMAL LOW (ref >90)
GFR calc non Af Amer: 60 mL/min — ABNORMAL LOW (ref >90)
GFR calc non Af Amer: 64 mL/min — ABNORMAL LOW (ref >90)
Glucose, Bld: 86 mg/dL (ref 70–99)
Glucose, Bld: 84 mg/dL (ref 70–99)
Potassium: 3.7 mEq/L (ref 3.5–5.1)
Potassium: 4.2 mEq/L (ref 3.5–5.1)
Sodium: 136 mEq/L (ref 135–145)

## 2013-02-22 SURGERY — CARDIOVERSION
Anesthesia: General

## 2013-02-22 MED ORDER — SODIUM CHLORIDE 0.9 % IV SOLN
INTRAVENOUS | Status: DC | PRN
  Administered 2013-02-22: 16:00:00 via INTRAVENOUS

## 2013-02-22 MED ORDER — PROPOFOL 10 MG/ML IV BOLUS
INTRAVENOUS | Status: DC | PRN
  Administered 2013-02-22: 100 mg via INTRAVENOUS

## 2013-02-22 MED ORDER — AMIODARONE HCL IN DEXTROSE 360-4.14 MG/200ML-% IV SOLN
30.0000 mg/h | INTRAVENOUS | Status: DC
  Administered 2013-02-22 (×3): 30 mg/h via INTRAVENOUS
  Filled 2013-02-22 (×4): qty 200

## 2013-02-22 MED ORDER — LISINOPRIL 5 MG PO TABS
5.0000 mg | ORAL_TABLET | Freq: Every day | ORAL | Status: DC
  Filled 2013-02-22: qty 1

## 2013-02-22 MED ORDER — PHENYLEPHRINE HCL 10 MG/ML IJ SOLN
INTRAMUSCULAR | Status: DC | PRN
  Administered 2013-02-22: 40 ug via INTRAVENOUS

## 2013-02-22 MED ORDER — FUROSEMIDE 80 MG PO TABS
80.0000 mg | ORAL_TABLET | Freq: Two times a day (BID) | ORAL | Status: DC
  Administered 2013-02-23 (×2): 80 mg via ORAL
  Filled 2013-02-22 (×3): qty 1

## 2013-02-22 MED ORDER — INFLUENZA VAC SPLIT QUAD 0.5 ML IM SUSP
0.5000 mL | INTRAMUSCULAR | Status: AC
  Administered 2013-02-23: 0.5 mL via INTRAMUSCULAR
  Filled 2013-02-22: qty 0.5

## 2013-02-22 MED ORDER — SPIRONOLACTONE 25 MG PO TABS
25.0000 mg | ORAL_TABLET | Freq: Every day | ORAL | Status: DC
  Administered 2013-02-22 – 2013-02-23 (×2): 25 mg via ORAL
  Filled 2013-02-22 (×2): qty 1

## 2013-02-22 MED ORDER — METOPROLOL SUCCINATE ER 50 MG PO TB24
50.0000 mg | ORAL_TABLET | Freq: Every day | ORAL | Status: DC
  Administered 2013-02-22 – 2013-02-23 (×2): 50 mg via ORAL
  Filled 2013-02-22 (×3): qty 1

## 2013-02-22 NOTE — Progress Notes (Signed)
Notified by case management of family concerns. Spoke with family and patient. Notified Dr. Delton See that family concerned about patient's plan of care.  Dr. Delton See to follow up with family.  Family notified.

## 2013-02-22 NOTE — H&P (View-Only) (Signed)
 Patient Name: Natasha Dickson Date of Encounter: 02/22/2013     Principal Problem:   Acute combined systolic and diastolic heart failure Active Problems:   A-fib   Congestive dilated cardiomyopathy    SUBJECTIVE  The patient feels significantly better, breathing is improved.  CURRENT MEDS . aspirin EC  81 mg Oral Daily  . digoxin  0.25 mg Oral Daily  . furosemide  80 mg Intravenous Q12H  . metoprolol tartrate  25 mg Oral QID  . potassium chloride  40 mEq Oral BID  . rivaroxaban  20 mg Oral Daily    OBJECTIVE  Filed Vitals:   02/21/13 2007 02/21/13 2011 02/21/13 2133 02/22/13 0437  BP: 117/79 98/57 114/78 130/7  Pulse: 82 82 88 79  Temp: 97.7 F (36.5 C)   98 F (36.7 C)  TempSrc: Oral   Oral  Resp: 18   18  Height:      Weight:    218 lb 0.6 oz (98.9 kg)  SpO2: 98%   98%    Intake/Output Summary (Last 24 hours) at 02/22/13 0816 Last data filed at 02/22/13 0804  Gross per 24 hour  Intake 1586.3 ml  Output   3975 ml  Net -2388.7 ml   Filed Weights   02/20/13 0502 02/21/13 0520 02/22/13 0437  Weight: 229 lb 3.2 oz (103.964 kg) 223 lb 11.2 oz (101.47 kg) 218 lb 0.6 oz (98.9 kg)    PHYSICAL EXAM  General: Pleasant, NAD. Neuro: Alert and oriented X 3. Moves all extremities spontaneously. Psych: Normal affect. HEENT:  Normal  Neck: Supple without bruits or JVD. Lungs:  Resp regular and unlabored, CTA. Heart: RRR no s3, s4, or murmurs. Abdomen: Soft, non-tender, non-distended, BS + x 4.  Extremities: No clubbing, cyanosis, edema 2+ B/L, erythema. DP/PT/Radials 2+ and equal bilaterally.  Accessory Clinical Findings  CBC  Recent Labs  02/22/13 0500  WBC 5.5  NEUTROABS 3.0  HGB 13.7  HCT 43.0  MCV 91.5  PLT 177   Basic Metabolic Panel  Recent Labs  02/21/13 1130 02/22/13 0500  NA 138 139  K 4.5 3.7  CL 95* 97  CO2 31 34*  GLUCOSE 135* 86  BUN 16 16  CREATININE 0.95 0.96  CALCIUM 9.1 9.0     TELE  Atrial fibrillation, HR  82-88/minute    Radiology/Studies  Nm Myocar Multi W/spect W/wall Motion / Ef  02/19/2013   Identification:  The patient is a 66-year-old with history of atrial fibrillation and  shortness of breath.  Test to evaluate rule out ischemia  Stress data:  The patient underwent Lexiscan stress testing per protocol.  Baseline EKG showed atrial fibrillation 95 beats per minute, T-wave inversion inferolaterally.  Baseline blood pressure 108/71.  With infusion of Lexiscan, EKG showed no significant ST changes to suggest ischemia.  Overall electrically negative.  Nuclear data:  The patient was studied in a 2-day rest/stress protocol.  She was injected with 30 mCi technetium 99 labeled sestamibi at rest, 30 mCi technetium 99 labeled sestamibi at stress.  Images were reconstructed in the short, vertical, horizontal axes.  On review of the raw data, soft tissue (diaphragm, bowel, breast) surround the heart.   In the initial stress images there is thinning with decreased counts noted in the mid/distal anterior wall.  There is also mild thinning noted in the inferolateral wall (base).  In the recovery images there was no significant change noted from the stress images.  On gating LVEF was calculated at   40% with normal thickening noted in the anterior wall, hypokinesis inferiorly.  Impression: Lexiscan Sestamibi.  Electrically negative for ischemia.  Sestamibi scan shows anterior thinning consistent with probable soft tissue attenuation(breast) cannot rule-out subendocardial scar.  Basal inferolateral thinning consistent with possible soft tissue attenuation versus subendocardial scar.  No evidence for significant ischemia.   LVEF on gating calculated at 40% with wall motion changes as noted.  Consider echo  to further evaluate systolic function/wall motion.  Overall low risk scan.   Original Report Authenticated By: Paula Ross     ASSESSMENT AND PLAN:   66 year old female with new dg of CHF, admitted with A-fib with  RVR  1. Afib RVR: S/p TEE and attempted DCCV on 02/18/13. Pt was unable to maintain sinus rhythm for longer than 30 seconds. Her left atrium was noted to be severely dilated at 5.7 cm, thus she will unlikely be able to be successfully cardioverted. She remains on an amiodaribe infusion and remains in rate controlled afib.   - we will attempt another CV today (no need for TEE), she has been anticoagulated, if unsuccessful, D/C Amiodarone, goal for rate control with BB, digoxin  -  Cont xarelto, digoxin, and metoprolol (25 mg qid currently).   2. New Cardiomyopathy: Given the nuclear MPI results, this is likely of nonischemic etiology. EF ~ 30 with glob HK by TEE on 02/18/13. She continues to exhibit volume overload but her lower extremity edema has improved, and has good urinary output.   - Cont IV lasix 80 mg bid  - will start a low dose ACEI tomorrow   3. Hypokalemia: check today. She is on bid potassium supplementation.    Signed, Sulamita Lafountain, H MD  

## 2013-02-22 NOTE — Progress Notes (Signed)
Patient Name: Natasha Dickson Date of Encounter: 02/22/2013     Principal Problem:   Acute combined systolic and diastolic heart failure Active Problems:   A-fib   Congestive dilated cardiomyopathy    SUBJECTIVE  The patient feels significantly better, breathing is improved.  CURRENT MEDS . aspirin EC  81 mg Oral Daily  . digoxin  0.25 mg Oral Daily  . furosemide  80 mg Intravenous Q12H  . metoprolol tartrate  25 mg Oral QID  . potassium chloride  40 mEq Oral BID  . rivaroxaban  20 mg Oral Daily    OBJECTIVE  Filed Vitals:   02/21/13 2007 02/21/13 2011 02/21/13 2133 02/22/13 0437  BP: 117/79 98/57 114/78 130/7  Pulse: 82 82 88 79  Temp: 97.7 F (36.5 C)   98 F (36.7 C)  TempSrc: Oral   Oral  Resp: 18   18  Height:      Weight:    218 lb 0.6 oz (98.9 kg)  SpO2: 98%   98%    Intake/Output Summary (Last 24 hours) at 02/22/13 0816 Last data filed at 02/22/13 0804  Gross per 24 hour  Intake 1586.3 ml  Output   3975 ml  Net -2388.7 ml   Filed Weights   02/20/13 0502 02/21/13 0520 02/22/13 0437  Weight: 229 lb 3.2 oz (103.964 kg) 223 lb 11.2 oz (101.47 kg) 218 lb 0.6 oz (98.9 kg)    PHYSICAL EXAM  General: Pleasant, NAD. Neuro: Alert and oriented X 3. Moves all extremities spontaneously. Psych: Normal affect. HEENT:  Normal  Neck: Supple without bruits or JVD. Lungs:  Resp regular and unlabored, CTA. Heart: RRR no s3, s4, or murmurs. Abdomen: Soft, non-tender, non-distended, BS + x 4.  Extremities: No clubbing, cyanosis, edema 2+ B/L, erythema. DP/PT/Radials 2+ and equal bilaterally.  Accessory Clinical Findings  CBC  Recent Labs  02/22/13 0500  WBC 5.5  NEUTROABS 3.0  HGB 13.7  HCT 43.0  MCV 91.5  PLT 177   Basic Metabolic Panel  Recent Labs  02/21/13 1130 02/22/13 0500  NA 138 139  K 4.5 3.7  CL 95* 97  CO2 31 34*  GLUCOSE 135* 86  BUN 16 16  CREATININE 0.95 0.96  CALCIUM 9.1 9.0     TELE  Atrial fibrillation, HR  82-88/minute    Radiology/Studies  Nm Myocar Multi W/spect W/wall Motion / Ef  02/19/2013   Identification:  The patient is a 67 year old with history of atrial fibrillation and  shortness of breath.  Test to evaluate rule out ischemia  Stress data:  The patient underwent Lexiscan stress testing per protocol.  Baseline EKG showed atrial fibrillation 95 beats per minute, T-wave inversion inferolaterally.  Baseline blood pressure 108/71.  With infusion of Lexiscan, EKG showed no significant ST changes to suggest ischemia.  Overall electrically negative.  Nuclear data:  The patient was studied in a 2-day rest/stress protocol.  She was injected with 30 mCi technetium 99 labeled sestamibi at rest, 30 mCi technetium 99 labeled sestamibi at stress.  Images were reconstructed in the short, vertical, horizontal axes.  On review of the raw data, soft tissue (diaphragm, bowel, breast) surround the heart.   In the initial stress images there is thinning with decreased counts noted in the mid/distal anterior wall.  There is also mild thinning noted in the inferolateral wall (base).  In the recovery images there was no significant change noted from the stress images.  On gating LVEF was calculated at  40% with normal thickening noted in the anterior wall, hypokinesis inferiorly.  Impression: Lexiscan Sestamibi.  Electrically negative for ischemia.  Sestamibi scan shows anterior thinning consistent with probable soft tissue attenuation(breast) cannot rule-out subendocardial scar.  Basal inferolateral thinning consistent with possible soft tissue attenuation versus subendocardial scar.  No evidence for significant ischemia.   LVEF on gating calculated at 40% with wall motion changes as noted.  Consider echo  to further evaluate systolic function/wall motion.  Overall low risk scan.   Original Report Authenticated By: Dietrich Pates     ASSESSMENT AND PLAN:   67 year old female with new dg of CHF, admitted with A-fib with  RVR  1. Afib RVR: S/p TEE and attempted DCCV on 02/18/13. Pt was unable to maintain sinus rhythm for longer than 30 seconds. Her left atrium was noted to be severely dilated at 5.7 cm, thus she will unlikely be able to be successfully cardioverted. She remains on an amiodaribe infusion and remains in rate controlled afib.   - we will attempt another CV today (no need for TEE), she has been anticoagulated, if unsuccessful, D/C Amiodarone, goal for rate control with BB, digoxin  -  Cont xarelto, digoxin, and metoprolol (25 mg qid currently).   2. New Cardiomyopathy: Given the nuclear MPI results, this is likely of nonischemic etiology. EF ~ 30 with glob HK by TEE on 02/18/13. She continues to exhibit volume overload but her lower extremity edema has improved, and has good urinary output.   - Cont IV lasix 80 mg bid  - will start a low dose ACEI tomorrow   3. Hypokalemia: check today. She is on bid potassium supplementation.    Signed, Lars Masson MD

## 2013-02-22 NOTE — Preoperative (Signed)
Beta Blockers   Reason not to administer Beta Blockers:Hold beta blocker due to hypotension 

## 2013-02-22 NOTE — Anesthesia Preprocedure Evaluation (Addendum)
Anesthesia Evaluation  Patient identified by MRN, date of birth, ID band Patient awake    Reviewed: Allergy & Precautions, H&P , NPO status , Patient's Chart, lab work & pertinent test results  History of Anesthesia Complications Negative for: history of anesthetic complications  Airway Mallampati: II TM Distance: >3 FB Neck ROM: Full    Dental  (+) Teeth Intact and Dental Advisory Given   Pulmonary shortness of breath,          Cardiovascular + Past MI (NSTEMI 1week ago; EF 30%; admit in HF), +CHF, + DOE and DVT + dysrhythmias Atrial Fibrillation Rhythm:Irregular     Neuro/Psych negative neurological ROS  negative psych ROS   GI/Hepatic negative GI ROS, Neg liver ROS,   Endo/Other  Morbid obesity  Renal/GU negative Renal ROS     Musculoskeletal negative musculoskeletal ROS (+)   Abdominal (+) + obese,   Peds  Hematology  (+) Blood dyscrasia Natasha Dickson), ,   Anesthesia Other Findings   Reproductive/Obstetrics negative OB ROS                          Anesthesia Physical Anesthesia Plan  ASA: III  Anesthesia Plan: General   Post-op Pain Management:    Induction: Intravenous  Airway Management Planned: Mask  Additional Equipment:   Intra-op Plan:   Post-operative Plan:   Informed Consent: I have reviewed the patients History and Physical, chart, labs and discussed the procedure including the risks, benefits and alternatives for the proposed anesthesia with the patient or authorized representative who has indicated his/her understanding and acceptance.     Plan Discussed with: CRNA and Surgeon  Anesthesia Plan Comments:         Anesthesia Quick Evaluation

## 2013-02-22 NOTE — Progress Notes (Signed)
Spoke with pt, pt husband and pt daughter at bedside regarding hospital stay per family request.  All are very unhappy with lack of communication between family and cardiologist.  When asked if they would like NCM to contact cardiologist on their behalf, they stated that they actually spoke to Dr. Delton See this morning but wanted to voice that they have requested communication for 5 days with no success.  They also requested Nutrition consult and all community services available.  NCM relayed information to Gregery Na, AD 4E; Lilyan Gilford, charge RN; and Geralynn Ochs, RN [facility] who promptly went to bedside to address concerns.  NCM will follow up prior to D/C home.

## 2013-02-22 NOTE — Progress Notes (Signed)
1700 Back from endoscopy via stretcher . Fully awake and alert and oriented . No compaint presented

## 2013-02-22 NOTE — Progress Notes (Signed)
Wrong patient vitals 

## 2013-02-22 NOTE — Anesthesia Postprocedure Evaluation (Signed)
Anesthesia Post Note  Patient: Natasha Dickson  Procedure(s) Performed: Procedure(s) (LRB): TRANSESOPHAGEAL ECHOCARDIOGRAM (TEE) (N/A)  Anesthesia type: general  Patient location: PACU  Post pain: Pain level controlled  Post assessment: Patient's Cardiovascular Status Stable  Last Vitals:  Filed Vitals:   02/22/13 1633  BP: 115/75  Pulse:   Temp:   Resp: 17    Post vital signs: Reviewed and stable  Level of consciousness: sedated  Complications: No apparent anesthesia complications

## 2013-02-22 NOTE — Transfer of Care (Signed)
Immediate Anesthesia Transfer of Care Note  Patient: Natasha Dickson  Procedure(s) Performed: Procedure(s): TRANSESOPHAGEAL ECHOCARDIOGRAM (TEE) (N/A)  Patient Location: Endoscopy Unit  Anesthesia Type:General  Level of Consciousness: awake, alert , oriented and patient cooperative  Airway & Oxygen Therapy: Patient Spontanous Breathing and Patient connected to nasal cannula oxygen  Post-op Assessment: Report given to PACU RN and Post -op Vital signs reviewed and stable  Post vital signs: Reviewed and stable  Complications: No apparent anesthesia complications

## 2013-02-22 NOTE — Op Note (Signed)
Patient anesthetized with 100 mg Propofol IV  With pads in AP position patient cardioverted to SR with 200J synchronized biphasic energy.  But, patient reverted to afib in 10 seconds.  Procedure without complication.

## 2013-02-22 NOTE — Interval H&P Note (Signed)
History and Physical Interval Note:  02/22/2013 3:26 PM  Natasha Dickson  has presented today for surgery, with the diagnosis of a-fib  The various methods of treatment have been discussed with the patient and family. After consideration of risks, benefits and other options for treatment, the patient has consented to  DC Cardioversion as a surgical intervention .  The patient's history has been reviewed, patient examined, no change in status, stable for surgery.  I have reviewed the patient's chart and labs.  Questions were answered to the patient's satisfaction.     Dietrich Pates

## 2013-02-22 NOTE — Progress Notes (Signed)
Patient evaluated for community based chronic disease management services with [facility] Care Management Program as a benefit of patient's [employer]. Patient will receive a post discharge transition of care call and will be evaluated for monthly home visits for assessments and CHF disease process education. Spoke with patient, spouse, and daughter at bedside to explain [facility] Care Management services.  Family has accepted services and consents have been obtained.  Patient has a new diagnosis of CHF.  She has requested information for diet teaching, medication management (will discharge on a anticoagulant), and daily weight monitoring.  Family will purchase a scale.  Left contact information and [facility] literature at bedside. Made inpatient Case Manager aware that [facility] Care Management following. Of note, [facility] Care Management services does not replace or interfere with any services that are arranged by inpatient case management or social work.  For additional questions or referrals please contact Anibal Henderson BSN RN [facility] Liaison at [phone].

## 2013-02-23 ENCOUNTER — Encounter (HOSPITAL_COMMUNITY): Payer: Self-pay | Admitting: Internal Medicine

## 2013-02-23 ENCOUNTER — Telehealth: Payer: Self-pay | Admitting: Cardiology

## 2013-02-23 DIAGNOSIS — I428 Other cardiomyopathies: Secondary | ICD-10-CM | POA: Diagnosis present

## 2013-02-23 DIAGNOSIS — E876 Hypokalemia: Secondary | ICD-10-CM

## 2013-02-23 LAB — CULTURE, BLOOD (ROUTINE X 2): Culture: NO GROWTH

## 2013-02-23 LAB — BASIC METABOLIC PANEL
Calcium: 9.1 mg/dL (ref 8.4–10.5)
Creatinine, Ser: 0.93 mg/dL (ref 0.50–1.10)
GFR calc Af Amer: 73 mL/min — ABNORMAL LOW (ref >90)
Sodium: 139 mEq/L (ref 135–145)

## 2013-02-23 MED ORDER — DIGOXIN 250 MCG PO TABS: 0.2500 mg | ORAL_TABLET | Freq: Every day | ORAL | Status: DC

## 2013-02-23 MED ORDER — METOPROLOL SUCCINATE ER 50 MG PO TB24: 50.0000 mg | ORAL_TABLET | Freq: Every day | ORAL | Status: DC

## 2013-02-23 MED ORDER — FUROSEMIDE 40 MG PO TABS: 60.0000 mg | ORAL_TABLET | Freq: Two times a day (BID) | ORAL | Status: DC

## 2013-02-23 MED ORDER — SPIRONOLACTONE 25 MG PO TABS: 12.5000 mg | ORAL_TABLET | Freq: Every day | ORAL | Status: DC

## 2013-02-23 MED ORDER — POTASSIUM CHLORIDE CRYS ER 20 MEQ PO TBCR: 40.0000 meq | EXTENDED_RELEASE_TABLET | Freq: Two times a day (BID) | ORAL | Status: DC

## 2013-02-23 MED ORDER — RIVAROXABAN 20 MG PO TABS: 20.0000 mg | ORAL_TABLET | Freq: Every day | ORAL | Status: DC

## 2013-02-23 MED ORDER — LISINOPRIL 2.5 MG PO TABS: 2.5000 mg | ORAL_TABLET | Freq: Every day | ORAL | Status: DC

## 2013-02-23 NOTE — Telephone Encounter (Signed)
New problem     14 days TCM with Dr. Delton See  On  9/26 @ 10:45

## 2013-02-23 NOTE — Discharge Summary (Addendum)
Patient ID: Natasha Dickson,  MRN: [id number], DOB/AGE: [birth year] 67 y.o.  Admit date: 02/15/2013 Discharge date: 02/23/2013  Primary Care Provider: Crawford Givens Primary Cardiologist: Eloy End, MD  Discharge Diagnoses Principal Problem:   Acute combined systolic and diastolic heart failure  **Net negative diuresis of 17.7 Liters with reduction in weight from 252 lbs on admission to 212 lbs on discharge.  Active Problems:   A-fib  **s/p TEE and attempted cardioversion x 2 (failed)  **Rate control with BB and Digoxin  **Xarelto initated (CHA2DS2VASc = 3)   Nonischemic cardiomyopathy  **EF 30-35% by echo this admission.  **Non-ischemic Lexiscan Cardiolite this admission.   Hypokalemia  **Supplemented.   Relative Hypotension  **Requiring adjustment of meds at discharge.  Allergies No Known Allergies  Procedures  2D Echocardiogram 9.9.2014  Study Conclusions  - Left ventricle: Diffuse hypokinesis worse in the inferior   wall   The cavity size was normal. Wall thickness was normal.   Systolic function was moderately to severely reduced. The   estimated ejection fraction was in the range of 30% to   35%. Diffuse hypokinesis. - Left atrium: The atrium was moderately dilated. - Right atrium: The atrium was moderately dilated. - Atrial septum: No defect or patent foramen ovale was   identified. _____________   Lower Extremity Ultrasound 9.10.2014  Summary:  - No evidence of deep vein thrombosis involving the   visualized veins of the right lower extremity. - No evidence of deep vein thrombosis involving the   visualized veins of the left lower extremity. - . Incidental findings are consistent with: Baker's Cyst on   the right. - No evidence of Baker's cyst on the left. - Varicose veins noted in bilateral calves. _____________   Transesophageal Echocardiogram and DCCV 9.11.2014  LEFT VENTRICLE: EF = ~30% global HK   RIGHT VENTRICLE: Normal  LEFT ATRIUM: Severely  dilated 5.7 cm  LEFT ATRIAL APPENDAGE: No clot  RIGHT ATRIUM: Dilated  AORTIC VALVE: Trileaflet. No AS/AI  MITRAL VALVE: Normal morphology. Moderate central MR  TRICUSPID VALVE: Severe TR. Eccentric toward septum  PULMONIC VALVE: Normal. No PR  INTERATRIAL SEPTUM: Aneurysmal. Probable small PFO with weakly + bubble study  PERICARDIUM: No effusion  DESCENDING AORTA: Mild plaque  CARDIOVERSION:  Indications: Atrial Fibrillation  Procedure Details:  Once the TEE was complete, the patient had the defibrillator pads placed in the anterior and posterior position. The patient then underwent further sedation with propofol 65mg  by the anesthesia service for cardioversion. Once an appropriate level of sedation was achieved, the patient received a single biphasic, synchronized 200J shock with prompt conversion to sinus rhythm. However after about 30 seconds on sinus rhythm she reverted to AF. No apparent complications.  _____________   Natasha Dickson 9.12.2014  Impression: Lexiscan Sestamibi.  Electrically negative for ischemia.  Sestamibi scan shows anterior thinning consistent with probable soft tissue attenuation(breast) cannot rule-out subendocardial scar.  Basal inferolateral thinning consistent with possible soft tissue attenuation versus subendocardial scar.  No evidence for significant ischemia.   LVEF on gating calculated at 40% with wall motion changes as noted.  Consider echo  to further evaluate systolic function/wall motion.  Overall low risk scan. _____________   History of Present Illness  67 year old female without prior cardiac history. Over a period of several weeks prior to admission, she began to experience ankle and foot edema as well as abdominal swelling, dyspnea, and orthopnea. She was seen by her primary care provider on September 8 and was noted to be  tachycardic. ECG showed rapid atrial fibrillation. She was sent to the [facility] emergency department for further  evaluation. There, chest x-ray showed cardiomegaly with vascular congestion is remained tachycardic. She was placed on IV diltiazem and anticoagulation along with IV lasix and admitted for further management.  Hospital Course  Patient ruled out for myocardial infarction. She remained in atrial fibrillation and echocardiogram showed LV dysfunction with an EF of 30-35%. She had significant volume overload and was maintained on IV diuretics with brisk diuresis. She also cosincemplained of right greater than left lower extremity swelling venous duplex was performed and was negative for DVT but did show a Baker's cyst on the right.  Atrial fibrillation rates improved however she remained in atrial fibrillation by September 10. As result, amiodarone was initiated and decision was made to pursue transesophageal echocardiogram and cardioversion. Transesophageal echocardiogram was carried out on September 11, and showed an EF of 30% with global hypokinesis. There was no evidence of left atrial appendage thrombus. An attempt was made at cardioversion using 200 J. Patient initially converted to sinus rhythm then reverted back to atrial fibrillation. Was felt that given her biatrial enlargement but the likelihood of maintaining sinus rhythm was poor. She was maintained on amiodarone infusion with a plan to reattempt cardioversion following amiodarone loading.  Anticoagulation regimen was also switch to Xarelto.  Repeat cardioversion was performed September 15 and again the patient converted to sinus rhythm but then reverted to atrial fibrillation within 10 seconds. At that point, focus was turned to rate control and amiodarone was discontinued. Rate control has been effectively achieved using metoprolol and digoxin therapy.  From a heart failure standpoint, [name] has had significant diuresis. She has had a total net negative of 17 L during this admission with reduction in weight from 252 pounds on admission to 212  pounds on discharge. Her Lasix was transitioned from IV to by mouth and she has been maintained on beta blocker, ACE inhibitor, and spironolactone therapy. Due to relative hypotension, ACE inhibitor and spurlike tone does have been reduced prior to discharge.  We have arranged for early outpatient followup next week and she will be discharged home today in good condition.  Discharge Vitals Blood pressure 111/60, pulse 96, temperature 97.8 F (36.6 C), temperature source Oral, resp. rate 16, height 5\' 2"  (1.575 m), weight 212 lb 1.3 oz (96.2 kg), SpO2 96.00%.  Filed Weights   02/21/13 0520 02/22/13 0437 02/23/13 0549  Weight: 223 lb 11.2 oz (101.47 kg) 218 lb 0.6 oz (98.9 kg) 212 lb 1.3 oz (96.2 kg)   Labs  CBC  Recent Labs  02/22/13 0500  WBC 5.5  NEUTROABS 3.0  HGB 13.7  HCT 43.0  MCV 91.5  PLT 177   Basic Metabolic Panel  Recent Labs  02/22/13 1158 02/23/13 0500  NA 136 139  K 4.2 4.1  CL 96 99  CO2 31 30  GLUCOSE 84 81  BUN 14 17  CREATININE 0.91 0.93  CALCIUM 9.4 9.1   Liver Function Tests Lab Results  Component Value Date   ALT 72* 02/17/2013   AST 69* 02/17/2013   ALKPHOS 68 02/17/2013   BILITOT 0.9 02/17/2013   Hemoglobin A1C Lab Results  Component Value Date   HGBA1C 5.7* 02/16/2013   Fasting Lipid Panel Lab Results  Component Value Date   CHOL 97 02/16/2013   HDL 26* 02/16/2013   LDLCALC 58 02/16/2013   TRIG 63 02/16/2013   CHOLHDL 3.7 02/16/2013   Thyroid Function  Tests Lab Results  Component Value Date   TSH 2.473 02/15/2013    Disposition  Pt is being discharged home today in good condition.  Follow-up Plans & Appointments  Follow-up Information   Follow up with Lars Masson, MD On 03/05/2013. (10:45 AM)    Specialty:  Cardiology   Contact information:   [address] [place] [address] [phone]       Follow up with Crawford Givens, MD. (as scheduled)    Specialty:  [facility]   Contact information:   [address] [place] [address] [phone]      Discharge Medications    Medication List    STOP taking these medications       ibuprofen 200 MG tablet  Commonly known as:  ADVIL,MOTRIN     NON FORMULARY      TAKE these medications       acetaminophen 500 MG tablet  Commonly known as:  TYLENOL  Take 1,000 mg by mouth every 6 (six) hours as needed for pain.     digoxin 0.25 MG tablet  Commonly known as:  LANOXIN  Take 1 tablet (0.25 mg total) by mouth daily.     furosemide 40 MG tablet  Commonly known as:  LASIX  Take 1.5 tablets (60 mg total) by mouth 2 (two) times daily.     lisinopril 2.5 MG tablet  Commonly known as:  PRINIVIL,ZESTRIL  Take 1 tablet (2.5 mg total) by mouth daily.     metoprolol succinate 50 MG 24 hr tablet  Commonly known as:  TOPROL-XL  Take 1 tablet (50 mg total) by mouth daily. Take with or immediately following a meal.     neomycin-polymyxin-pramoxine 1 % cream  Commonly known as:  NEOSPORIN PLUS  Apply 1 application topically as needed (rash).     potassium chloride SA 20 MEQ tablet  Commonly known as:  K-DUR,KLOR-CON  Take 2 tablets (40 mEq total) by mouth 2 (two) times daily.     Rivaroxaban 20 MG Tabs tablet  Commonly known as:  XARELTO  Take 1 tablet (20 mg total) by mouth daily.     spironolactone 25 MG tablet  Commonly known as:  ALDACTONE  Take 0.5 tablets (12.5 mg total) by mouth daily.       Outstanding Labs/Studies  bmet @ f/u appt.  Duration of Discharge Encounter   Greater than 30 minutes including physician time.  Signed, Nicolasa Ducking NP 02/23/2013, 3:24 PM   The patient was seen, discussed and evaluated with Ward Givens, PA. I agree with the above.   Tobias Alexander, Rexene Edison 02/23/2013

## 2013-02-23 NOTE — Progress Notes (Signed)
1645 Discharge instructions and prescriptions given to pt and spouse ,> both Verbalized understanding 1700 Wheeled to lobby by NT

## 2013-02-23 NOTE — Progress Notes (Signed)
Patient Name: Natasha Dickson Date of Encounter: 02/23/2013     Principal Problem:   Acute combined systolic and diastolic heart failure Active Problems:   A-fib   Congestive dilated cardiomyopathy    SUBJECTIVE  The patient feels significantly better, breathing is improved.  CURRENT MEDS . aspirin EC  81 mg Oral Daily  . digoxin  0.25 mg Oral Daily  . furosemide  80 mg Oral BID  . influenza vac split quadrivalent PF  0.5 mL Intramuscular Tomorrow-1000  . lisinopril  5 mg Oral Daily  . metoprolol succinate  50 mg Oral Daily  . potassium chloride  40 mEq Oral BID  . rivaroxaban  20 mg Oral Daily  . spironolactone  25 mg Oral Daily    OBJECTIVE  Filed Vitals:   02/22/13 2025 02/22/13 2036 02/23/13 0549 02/23/13 0858  BP: 95/72 114/91 127/71 90/50  Pulse: 93 81 73 80  Temp: 98.2 F (36.8 C) 97.8 F (36.6 C) 98.3 F (36.8 C)   TempSrc: Oral Oral Oral   Resp: 16 18 18 16   Height:      Weight:   212 lb 1.3 oz (96.2 kg)   SpO2: 97% 96% 94% 96%    Intake/Output Summary (Last 24 hours) at 02/23/13 1051 Last data filed at 02/23/13 0606  Gross per 24 hour  Intake    530 ml  Output   2900 ml  Net  -2370 ml   Filed Weights   02/21/13 0520 02/22/13 0437 02/23/13 0549  Weight: 223 lb 11.2 oz (101.47 kg) 218 lb 0.6 oz (98.9 kg) 212 lb 1.3 oz (96.2 kg)    PHYSICAL EXAM  General: Pleasant, NAD. Neuro: Alert and oriented X 3. Moves all extremities spontaneously. Psych: Normal affect. HEENT:  Normal  Neck: Supple without bruits or JVD. Lungs:  Resp regular and unlabored, CTA. Heart: RRR no s3, s4, or murmurs. Abdomen: Soft, non-tender, non-distended, BS + x 4.  Extremities: No clubbing, cyanosis, edema 2+ B/L, erythema minimal residual. DP/PT/Radials 2+ and equal bilaterally.  Accessory Clinical Findings  CBC  Recent Labs  02/22/13 0500  WBC 5.5  NEUTROABS 3.0  HGB 13.7  HCT 43.0  MCV 91.5  PLT 177   Basic Metabolic Panel  Recent Labs  02/22/13 1158  02/23/13 0500  NA 136 139  K 4.2 4.1  CL 96 99  CO2 31 30  GLUCOSE 84 81  BUN 14 17  CREATININE 0.91 0.93  CALCIUM 9.4 9.1     TELE  Atrial fibrillation, HR 82-88/minute    Radiology/Studies  Nm Myocar Multi W/spect W/wall Motion / Ef  02/19/2013   Identification:  The patient is a 67 year old with history of atrial fibrillation and  shortness of breath.  Test to evaluate rule out ischemia  Stress data:  The patient underwent Lexiscan stress testing per protocol.  Baseline EKG showed atrial fibrillation 95 beats per minute, T-wave inversion inferolaterally.  Baseline blood pressure 108/71.  With infusion of Lexiscan, EKG showed no significant ST changes to suggest ischemia.  Overall electrically negative.  Nuclear data:  The patient was studied in a 2-day rest/stress protocol.  She was injected with 30 mCi technetium 99 labeled sestamibi at rest, 30 mCi technetium 99 labeled sestamibi at stress.  Images were reconstructed in the short, vertical, horizontal axes.  On review of the raw data, soft tissue (diaphragm, bowel, breast) surround the heart.   In the initial stress images there is thinning with decreased counts noted in the  mid/distal anterior wall.  There is also mild thinning noted in the inferolateral wall (base).  In the recovery images there was no significant change noted from the stress images.  On gating LVEF was calculated at 40% with normal thickening noted in the anterior wall, hypokinesis inferiorly.  Impression: Lexiscan Sestamibi.  Electrically negative for ischemia.  Sestamibi scan shows anterior thinning consistent with probable soft tissue attenuation(breast) cannot rule-out subendocardial scar.  Basal inferolateral thinning consistent with possible soft tissue attenuation versus subendocardial scar.  No evidence for significant ischemia.   LVEF on gating calculated at 40% with wall motion changes as noted.  Consider echo  to further evaluate systolic function/wall  motion.  Overall low risk scan.   Original Report Authenticated By: Dietrich Pates     ASSESSMENT AND PLAN:   67 year old female with new dg of CHF, admitted with A-fib with RVR  1. Afib RVR: S/p TEE and 2 attempted DCCV on 02/18/13 and 02/22/13. Pt was unable to maintain sinus rhythm for longer than few seconds. Her left atrium was noted to be severely dilated at 5.7 cm, thus she will unlikely be able to be successfully cardioverted. Amiodarone was stopped, we will rate control her on Metoprolol and Digoxin. - anticoagulation with Xarelto, care management consult pending for the evaluation of cost, if unaffordable switch to Coumadine and Lovenox shots for bridging (her daughter is a Engineer, civil (consulting) and can give her shots)  2. New Cardiomyopathy: Given the nuclear MPI results, this is likely of nonischemic etiology. EF ~ 30 with glob HK by TEE on 02/18/13. She continues to exhibit volume overload but her lower extremity edema has improved, and has good urinary output.   - Switch Lasix to PO 60 mg at 8 am and at 2 pm, Spironolactone once daily 25 mg  -  Started on Lisinopril 5 mg daily, uptitrate as tolerated by BP  3. Hypokalemia: check today. She is on bid potassium supplementation.   4. Dietitian consult pending  Follow up in my clinic early next week. She is advised to stay at home until I see her. She will need a note for work.  Signed, Lars Masson MD

## 2013-02-23 NOTE — Progress Notes (Signed)
Nutrition Education Note  RD consulted for nutrition education regarding new onset CHF.  RD provided "Heart Failure Nutrition Therapy" handout from the Academy of [employer] and [employer]etics. Reviewed patient's dietary recall. Provided examples on ways to decrease sodium intake in diet. Pt reports eating a lot of pasta dishes, sandwiches, and soup; reviewed ways to decrease sodium content of these meals.  Discouraged intake of processed foods and use of salt shaker. Encouraged fresh fruits and vegetables as well as whole grain sources of carbohydrates to maximize fiber intake.  Pt states she will stop snacking on salted nuts and will stop eating hot dogs. Pt also reports plans to decrease soda intake and to not use the salt shaker.   RD discussed why it is important for patient to adhere to diet recommendations, and emphasized foods to avoid, and importance of weighing self. Teach back method used.  Expect fair/good compliance.  Body mass index is 38.78 kg/(m^2). Pt meets criteria for Obesity based on current BMI.  Current diet order is Heart Healthy, patient is consuming approximately 100% of meals at this time. Labs and medications reviewed. No further nutrition interventions warranted at this time. RD contact information provided. If additional nutrition issues arise, please re-consult RD.   Ian Malkin RD, LDN Inpatient Clinical Dietitian Pager: [phone] After Hours Pager: [phone]

## 2013-02-23 NOTE — Progress Notes (Deleted)
0830 Pt , son and  Dr Benjamine Mola met for palliative meeting. Left wilth orders

## 2013-02-24 DIAGNOSIS — R269 Unspecified abnormalities of gait and mobility: Secondary | ICD-10-CM | POA: Diagnosis not present

## 2013-02-24 DIAGNOSIS — I10 Essential (primary) hypertension: Secondary | ICD-10-CM | POA: Diagnosis not present

## 2013-02-24 DIAGNOSIS — Z7901 Long term (current) use of anticoagulants: Secondary | ICD-10-CM | POA: Diagnosis not present

## 2013-02-24 DIAGNOSIS — I4891 Unspecified atrial fibrillation: Secondary | ICD-10-CM | POA: Diagnosis not present

## 2013-02-24 DIAGNOSIS — I5041 Acute combined systolic (congestive) and diastolic (congestive) heart failure: Secondary | ICD-10-CM | POA: Diagnosis not present

## 2013-02-24 NOTE — Telephone Encounter (Signed)
Called and spoke w/husband.  States she is doing well.  [facility] has been out and said her HR was a little irregular. Has been able to get all of her medication except Digoxin.  Was told needed prior authorization.  Our refill nurse hasn't received notice from pharmacy.  I called [facility] [phone] and got prior auth # [id number]. Pt ID # [id number].  Spoke w/Patrick at [employer]. Did authorization over the phone but said would have to go to medical direction and would take 24 hrs.  Called husband back and advised him that we had Lanoxin samples in office but she would need to take 4 pills to equal the correct dose of Digoxin 0.25mg .  He will come by to pick them up.  Also is aware of app on 9/26 with Dr. Delton See.  He states he would like to get 90 day supply of her medication since has mail order.  Advised that initially only 30 days supply is given until seen in office to be sure pt will be continued on these meds.  He understands and will check at her office visit.

## 2013-02-24 NOTE — Telephone Encounter (Signed)
Pt discharged from [facility] 9/16 s/p cardioversion.  F/u with Dr. Delton See 9/26 @ 10:45 Left message for patient to call us back

## 2013-02-24 NOTE — Progress Notes (Signed)
followed up with Natasha Dickson regarding her mom, Natasha Dickson discharge experience.    I explained that I told her dad that the PA Natasha Dickson not go to Dickson pt prior to discharge- that the RN had all prescriptions and instructions needed and he was okay with that.  In hindsight, we should have asked Natasha Dickson to come- just because.   I explained that follow-up appt with Natasha Dickson is the most practical at this time.  Natasha Dickson does not have an open appt until November and if she saw someone else in the office (Natasha Dickson or Natasha Dickson)- she would be brand new to that provider.  She agreed.  I suggested she make the next appt with Natasha Dickson.   I checked the list of medication given in the hospital, and the pt was indeed on both Lasix and spironolactone.  I explained the mechanism of both medications and she stated that she understood.    Her requests for me was to:     Obtain 30 and 90 day prescriptions for her mom since they utilize mail-order. (I have contacted Natasha Dickson and he will take care of calling/faxing prescriptions to mail order provider).     Her other concern was that the pharmacy would not release the digoxin r/t prior approval by insurance provider Natasha Dickson KitchenWard Dickson is also taking care of this matter).

## 2013-02-25 ENCOUNTER — Telehealth: Payer: Self-pay | Admitting: *Deleted

## 2013-02-25 ENCOUNTER — Telehealth: Payer: Self-pay

## 2013-02-25 DIAGNOSIS — R269 Unspecified abnormalities of gait and mobility: Secondary | ICD-10-CM | POA: Diagnosis not present

## 2013-02-25 DIAGNOSIS — Z7901 Long term (current) use of anticoagulants: Secondary | ICD-10-CM | POA: Diagnosis not present

## 2013-02-25 DIAGNOSIS — I10 Essential (primary) hypertension: Secondary | ICD-10-CM | POA: Diagnosis not present

## 2013-02-25 DIAGNOSIS — I4891 Unspecified atrial fibrillation: Secondary | ICD-10-CM | POA: Diagnosis not present

## 2013-02-25 DIAGNOSIS — I5041 Acute combined systolic (congestive) and diastolic (congestive) heart failure: Secondary | ICD-10-CM | POA: Diagnosis not present

## 2013-02-25 NOTE — Telephone Encounter (Signed)
Pt's daughter Consuella Lose said pt was admitted to [facility] on 02/15/13;Consuella Lose said it was difficult to get information about pt's care and condition. Consuella Lose has spoken with Brunilda Payor President of nursing and pt assistance due to hospital experience.Consuella Lose request in writing all lab results from recent hospital stay and a overall summary of pt's care while in hospital. Explained to Consuella Lose she needed to contact [facility] for that information. Consuella Lose said that she had but with no response. Spoke with Hansel Starling, Print production planner and she advised Consuella Lose to request information again and if still no response to speak with Drinda Butts at [facility]. Consuella Lose requested pt's weight on 02/15/13 and I advised 252 lbs 4 oz. Consuella Lose was appreciative and will f/u at [facility].

## 2013-02-25 NOTE — Telephone Encounter (Signed)
It doesn't appear there is anything for me to do at this point.  I'll await update, as needed.

## 2013-02-25 NOTE — Telephone Encounter (Signed)
Received approval letter from [facility] for XARELTO 20MG , [place] [id number],  patient ID [id number], GPI/NDC [id number] approved through 02/23/2014.

## 2013-02-25 NOTE — Telephone Encounter (Signed)
[employer] approval for digoxin medication, approval #[id number]

## 2013-02-25 NOTE — Telephone Encounter (Signed)
Received call from Faith RN [facility]. Requesting PT/OT order for assessment. Gave verbal order/ to send to Dr/Nurse.

## 2013-03-01 ENCOUNTER — Encounter: Payer: Self-pay | Admitting: *Deleted

## 2013-03-01 DIAGNOSIS — I10 Essential (primary) hypertension: Secondary | ICD-10-CM | POA: Diagnosis not present

## 2013-03-01 DIAGNOSIS — I4891 Unspecified atrial fibrillation: Secondary | ICD-10-CM | POA: Diagnosis not present

## 2013-03-01 DIAGNOSIS — I5041 Acute combined systolic (congestive) and diastolic (congestive) heart failure: Secondary | ICD-10-CM | POA: Diagnosis not present

## 2013-03-01 DIAGNOSIS — Z7901 Long term (current) use of anticoagulants: Secondary | ICD-10-CM | POA: Diagnosis not present

## 2013-03-01 DIAGNOSIS — R269 Unspecified abnormalities of gait and mobility: Secondary | ICD-10-CM | POA: Diagnosis not present

## 2013-03-04 DIAGNOSIS — R269 Unspecified abnormalities of gait and mobility: Secondary | ICD-10-CM | POA: Diagnosis not present

## 2013-03-04 DIAGNOSIS — I4891 Unspecified atrial fibrillation: Secondary | ICD-10-CM | POA: Diagnosis not present

## 2013-03-04 DIAGNOSIS — Z7901 Long term (current) use of anticoagulants: Secondary | ICD-10-CM | POA: Diagnosis not present

## 2013-03-04 DIAGNOSIS — I10 Essential (primary) hypertension: Secondary | ICD-10-CM | POA: Diagnosis not present

## 2013-03-04 DIAGNOSIS — I5041 Acute combined systolic (congestive) and diastolic (congestive) heart failure: Secondary | ICD-10-CM | POA: Diagnosis not present

## 2013-03-05 ENCOUNTER — Telehealth: Payer: Self-pay | Admitting: Cardiology

## 2013-03-05 ENCOUNTER — Ambulatory Visit (INDEPENDENT_AMBULATORY_CARE_PROVIDER_SITE_OTHER): Payer: Medicare Other | Admitting: Cardiology

## 2013-03-05 ENCOUNTER — Encounter: Payer: Self-pay | Admitting: *Deleted

## 2013-03-05 ENCOUNTER — Encounter: Payer: Self-pay | Admitting: Cardiology

## 2013-03-05 VITALS — BP 100/70 | Resp 76 | Wt 201.0 lb

## 2013-03-05 DIAGNOSIS — I5041 Acute combined systolic (congestive) and diastolic (congestive) heart failure: Secondary | ICD-10-CM | POA: Diagnosis not present

## 2013-03-05 DIAGNOSIS — I509 Heart failure, unspecified: Secondary | ICD-10-CM

## 2013-03-05 DIAGNOSIS — I4891 Unspecified atrial fibrillation: Secondary | ICD-10-CM

## 2013-03-05 MED ORDER — FUROSEMIDE 40 MG PO TABS: 60.0000 mg | ORAL_TABLET | Freq: Every day | ORAL | Status: DC

## 2013-03-05 NOTE — Telephone Encounter (Signed)
**Note De-Identified  Obfuscation** Is it ok to write this letter and if so how many hours can she work a week?

## 2013-03-05 NOTE — Progress Notes (Signed)
Patient ID: SALLY-ANNE WAMBLE, female   DOB: 09/05/1945, 67 y.o.   MRN: [id number]    Patient Name: Natasha Dickson Date of Encounter: 03/05/2013  Primary Care Provider:  Crawford Givens, MD Primary Cardiologist:  Tobias Alexander, H  Patient Profile  Follow up after hospitalization for CHF and new diagnosed a-fib  Problem List   Past Medical History  Diagnosis Date  . Breast cancer 1999    Stage I in left breast  . Closed fracture of unspecified part of neck of femur 2011    Right.  Soreness when standing. (Dr. Cleophas Dunker)  . Chronic systolic CHF (congestive heart failure)     a. 02/2013 Echo: EF 30-35%, diff HK, mod dil LA/RA.  Marland Kitchen Atrial fibrillation     a. 02/2013: s/p TEE and attempted cardioversion x 2-->failed-->rate controlled with bb/digoxin, Xarelto initiated.  . Nonischemic cardiomyopathy     a. 02/2013 Lexi CL: No ischemia, prob attenuation vs scar, EF 40%-->Med Rx.   Past Surgical History  Procedure Laterality Date  . Mastectomy  1999    Left breast with saline implant.  . Tubal ligation    . Tubal ligation    . Partial hip arthroplasty Right   . Tee without cardioversion N/A 02/18/2013    Procedure: TRANSESOPHAGEAL ECHOCARDIOGRAM (TEE);  Surgeon: Dolores Patty, MD;  Location: [facility] ENDOSCOPY;  Service: Cardiovascular;  Laterality: N/A;  . Cardioversion N/A 02/18/2013    Procedure: CARDIOVERSION;  Surgeon: Dolores Patty, MD;  Location: [facility] ENDOSCOPY;  Service: Cardiovascular;  Laterality: N/A;  . Cardioversion N/A 02/22/2013    Procedure: CARDIOVERSION;  Surgeon: Pricilla Riffle, MD;  Location: [facility] ENDOSCOPY;  Service: Cardiovascular;  Laterality: N/A;    Allergies  No Known Allergies  HPI  67 year old female that was discharged from [facility] 1 week ago. She was dg with combined systolic and diastolic CHF,  And A-fib with RVR.  She feels significantly better, SOB improved, so did the lower extremity swelling. She denies palpitations. The patient is inquiring about returning  to her part time job.  Home Medications  Prior to Admission medications   Medication Sig Start Date End Date Taking? Authorizing Provider  acetaminophen (TYLENOL) 500 MG tablet Take 1,000 mg by mouth every 6 (six) hours as needed for pain.   Yes Historical Provider, MD  digoxin (LANOXIN) 0.25 MG tablet Take 1 tablet (0.25 mg total) by mouth daily. 02/23/13  Yes Ok Anis, NP  furosemide (LASIX) 40 MG tablet Take 1.5 tablets (60 mg total) by mouth 2 (two) times daily. 02/23/13  Yes Ok Anis, NP  lisinopril (PRINIVIL,ZESTRIL) 2.5 MG tablet Take 1 tablet (2.5 mg total) by mouth daily. 02/23/13  Yes Ok Anis, NP  metoprolol succinate (TOPROL-XL) 50 MG 24 hr tablet Take 1 tablet (50 mg total) by mouth daily. Take with or immediately following a meal. 02/23/13  Yes Ok Anis, NP  neomycin-polymyxin-pramoxine (NEOSPORIN PLUS) 1 % cream Apply 1 application topically as needed (rash).   Yes Historical Provider, MD  potassium chloride SA (K-DUR,KLOR-CON) 20 MEQ tablet Take 2 tablets (40 mEq total) by mouth 2 (two) times daily. 02/23/13  Yes Ok Anis, NP  Rivaroxaban (XARELTO) 20 MG TABS tablet Take 1 tablet (20 mg total) by mouth daily. 02/23/13  Yes Ok Anis, NP  spironolactone (ALDACTONE) 25 MG tablet Take 0.5 tablets (12.5 mg total) by mouth daily. 02/23/13  Yes Ok Anis, NP    Review of Systems  All  other systems reviewed and are otherwise negative except as noted above.  Physical Exam  Blood pressure 100/70, resp. rate 76, weight 201 lb (91.173 kg).  General: Pleasant, NAD Psych: Normal affect. Neuro: Alert and oriented X 3. Moves all extremities spontaneously. HEENT: Normal  Neck: Supple without bruits or JVD. Lungs:  Resp regular and unlabored, CTA. Heart: RRR no s3, s4, or murmurs. Abdomen: Soft, obese, non-tender, non-distended, BS + x 4.  Extremities: No clubbing, cyanosis, minimal B/L edema and erythema.  DP/PT/Radials 2+ and equal bilaterally.  Accessory Clinical Findings  2D Echocardiogram 9.9.2014  Study Conclusions  - Left ventricle: Diffuse hypokinesis worse in the inferior wall The cavity size was normal. Wall thickness was normal. Systolic function was moderately to severely reduced. The estimated ejection fraction was in the range of 30% to 35%. Diffuse hypokinesis. - Left atrium: The atrium was moderately dilated. - Right atrium: The atrium was moderately dilated. - Atrial septum: No defect or patent foramen ovale was identified.  _____________   ASSESSMENT AND PLAN:   67 year old female with new dg of CHF, admitted with A-fib with RVR   1. Afib RVR: S/p TEE and 2 attempted DCCV on 02/18/13 and 02/22/13. Pt was unable to maintain sinus rhythm for longer than few seconds. Her left atrium was noted to be severely dilated at 5.7 cm. She is currently rate controlled on Metoprolol and Digoxin. Anticoagulation with Xarelto  2. Combined systolic and diastolic CHF, non-ischemic, EF 30%, most probably tachycardia induced. We will repeat echo in few month to see if there are any improvements. The patient appears euvolemic, we will decrease Lasix 60 PO BID to a daily dose.  She is ok to return to her part time job at [employer].   Follow up in 4 weeks   Tobias Alexander, Rexene Edison, MD 03/05/2013, 11:01 AM

## 2013-03-05 NOTE — Telephone Encounter (Signed)
Would you mind to write a note for Natasha Dickson to return to work.

## 2013-03-05 NOTE — Telephone Encounter (Signed)
New Problem  Pt states she needs a Dr's note to return to work for limited hours. Requests that the letter is mailed to her home.

## 2013-03-05 NOTE — Patient Instructions (Addendum)
Your physician has recommended you make the following change in your medication:  1) Decrease Furosemide to 60 mg daily (so take 1.5 tablets daily)  Your physician recommends that you schedule a follow-up appointment in: one month with Dr.  Delton See

## 2013-03-08 DIAGNOSIS — I10 Essential (primary) hypertension: Secondary | ICD-10-CM | POA: Diagnosis not present

## 2013-03-08 DIAGNOSIS — I5041 Acute combined systolic (congestive) and diastolic (congestive) heart failure: Secondary | ICD-10-CM | POA: Diagnosis not present

## 2013-03-08 DIAGNOSIS — R269 Unspecified abnormalities of gait and mobility: Secondary | ICD-10-CM | POA: Diagnosis not present

## 2013-03-08 DIAGNOSIS — Z7901 Long term (current) use of anticoagulants: Secondary | ICD-10-CM | POA: Diagnosis not present

## 2013-03-08 DIAGNOSIS — I4891 Unspecified atrial fibrillation: Secondary | ICD-10-CM | POA: Diagnosis not present

## 2013-03-11 DIAGNOSIS — I10 Essential (primary) hypertension: Secondary | ICD-10-CM | POA: Diagnosis not present

## 2013-03-11 DIAGNOSIS — R269 Unspecified abnormalities of gait and mobility: Secondary | ICD-10-CM | POA: Diagnosis not present

## 2013-03-11 DIAGNOSIS — I5041 Acute combined systolic (congestive) and diastolic (congestive) heart failure: Secondary | ICD-10-CM | POA: Diagnosis not present

## 2013-03-11 DIAGNOSIS — I4891 Unspecified atrial fibrillation: Secondary | ICD-10-CM | POA: Diagnosis not present

## 2013-03-11 DIAGNOSIS — Z7901 Long term (current) use of anticoagulants: Secondary | ICD-10-CM | POA: Diagnosis not present

## 2013-03-15 DIAGNOSIS — M76899 Other specified enthesopathies of unspecified lower limb, excluding foot: Secondary | ICD-10-CM | POA: Diagnosis not present

## 2013-03-16 DIAGNOSIS — Z7901 Long term (current) use of anticoagulants: Secondary | ICD-10-CM | POA: Diagnosis not present

## 2013-03-16 DIAGNOSIS — R269 Unspecified abnormalities of gait and mobility: Secondary | ICD-10-CM | POA: Diagnosis not present

## 2013-03-16 DIAGNOSIS — I5041 Acute combined systolic (congestive) and diastolic (congestive) heart failure: Secondary | ICD-10-CM | POA: Diagnosis not present

## 2013-03-16 DIAGNOSIS — I10 Essential (primary) hypertension: Secondary | ICD-10-CM | POA: Diagnosis not present

## 2013-03-16 DIAGNOSIS — I4891 Unspecified atrial fibrillation: Secondary | ICD-10-CM | POA: Diagnosis not present

## 2013-03-24 DIAGNOSIS — R262 Difficulty in walking, not elsewhere classified: Secondary | ICD-10-CM | POA: Diagnosis not present

## 2013-03-24 DIAGNOSIS — M76899 Other specified enthesopathies of unspecified lower limb, excluding foot: Secondary | ICD-10-CM | POA: Diagnosis not present

## 2013-03-24 DIAGNOSIS — M25579 Pain in unspecified ankle and joints of unspecified foot: Secondary | ICD-10-CM | POA: Diagnosis not present

## 2013-03-29 DIAGNOSIS — M25559 Pain in unspecified hip: Secondary | ICD-10-CM | POA: Diagnosis not present

## 2013-03-29 DIAGNOSIS — M76899 Other specified enthesopathies of unspecified lower limb, excluding foot: Secondary | ICD-10-CM | POA: Diagnosis not present

## 2013-04-01 DIAGNOSIS — M76899 Other specified enthesopathies of unspecified lower limb, excluding foot: Secondary | ICD-10-CM | POA: Diagnosis not present

## 2013-04-01 DIAGNOSIS — M25559 Pain in unspecified hip: Secondary | ICD-10-CM | POA: Diagnosis not present

## 2013-04-06 ENCOUNTER — Ambulatory Visit (INDEPENDENT_AMBULATORY_CARE_PROVIDER_SITE_OTHER): Payer: Medicare Other | Admitting: Cardiology

## 2013-04-06 ENCOUNTER — Encounter: Payer: Self-pay | Admitting: Cardiology

## 2013-04-06 VITALS — BP 118/82 | HR 105 | Ht 62.0 in | Wt 192.0 lb

## 2013-04-06 DIAGNOSIS — I5041 Acute combined systolic (congestive) and diastolic (congestive) heart failure: Secondary | ICD-10-CM | POA: Diagnosis not present

## 2013-04-06 NOTE — Patient Instructions (Signed)
**Note De-identified  Obfuscation** Your physician has requested that you have an echocardiogram. Echocardiography is a painless test that uses sound waves to create images of your heart. It provides your doctor with information about the size and shape of your heart and how well your heart's chambers and valves are working. This procedure takes approximately one hour. There are no restrictions for this procedure.  Your physician recommends that you schedule a follow-up appointment in: 3 months  

## 2013-04-06 NOTE — Progress Notes (Signed)
Patient ID: Natasha Dickson, female   DOB: [birth year], 67 y.o.   MRN: [id number]  Patient Name: Natasha Dickson Date of Encounter: 04/06/2013  Primary Care Provider:  Crawford Givens, MD Primary Cardiologist:  Tobias Alexander, H  Patient Profile  Follow up after hospitalization for CHF and new diagnosed a-fib  Problem List   Past Medical History  Diagnosis Date  . Breast cancer 1999    Stage I in left breast  . Closed fracture of unspecified part of neck of femur 2011    Right.  Soreness when standing. (Dr. Cleophas Dunker)  . Chronic systolic CHF (congestive heart failure)     a. 02/2013 Echo: EF 30-35%, diff HK, mod dil LA/RA.  Marland Kitchen Atrial fibrillation     a. 02/2013: s/p TEE and attempted cardioversion x 2-->failed-->rate controlled with bb/digoxin, Xarelto initiated.  . Nonischemic cardiomyopathy     a. 02/2013 Lexi CL: No ischemia, prob attenuation vs scar, EF 40%-->Med Rx.   Past Surgical History  Procedure Laterality Date  . Mastectomy  1999    Left breast with saline implant.  . Tubal ligation    . Tubal ligation    . Partial hip arthroplasty Right   . Tee without cardioversion N/A 02/18/2013    Procedure: TRANSESOPHAGEAL ECHOCARDIOGRAM (TEE);  Surgeon: Dolores Patty, MD;  Location: [facility] ENDOSCOPY;  Service: Cardiovascular;  Laterality: N/A;  . Cardioversion N/A 02/18/2013    Procedure: CARDIOVERSION;  Surgeon: Dolores Patty, MD;  Location: [facility] ENDOSCOPY;  Service: Cardiovascular;  Laterality: N/A;  . Cardioversion N/A 02/22/2013    Procedure: CARDIOVERSION;  Surgeon: Pricilla Riffle, MD;  Location: [facility] ENDOSCOPY;  Service: Cardiovascular;  Laterality: N/A;    Allergies  No Known Allergies  HPI  67 year old female that was discharged from [facility] 1 week ago. She was dg with combined systolic and diastolic CHF,  And A-fib with RVR.  She feels significantly better, SOB improved, so did the lower extremity swelling. She denies palpitations. The patient is inquiring about returning to  her part time job.  The patient is coming after 3 weeks. She started to work part-time job. She feels fairly well but on ocassions she feels weak and she just switches to lighter pace. Other than her job she doesn't do any physical activity. She is compliant to her medications and denies any chest pain, shortness of breath at rest, paroxysmal nocturnal dyspnea or orthopnea. She denied any palpitations or syncope.  Home Medications  Prior to Admission medications   Medication Sig Start Date End Date Taking? Authorizing Provider  acetaminophen (TYLENOL) 500 MG tablet Take 1,000 mg by mouth every 6 (six) hours as needed for pain.   Yes Historical Provider, MD  digoxin (LANOXIN) 0.25 MG tablet Take 1 tablet (0.25 mg total) by mouth daily. 02/23/13  Yes Ok Anis, NP  furosemide (LASIX) 40 MG tablet Take 1.5 tablets (60 mg total) by mouth 2 (two) times daily. 02/23/13  Yes Ok Anis, NP  lisinopril (PRINIVIL,ZESTRIL) 2.5 MG tablet Take 1 tablet (2.5 mg total) by mouth daily. 02/23/13  Yes Ok Anis, NP  metoprolol succinate (TOPROL-XL) 50 MG 24 hr tablet Take 1 tablet (50 mg total) by mouth daily. Take with or immediately following a meal. 02/23/13  Yes Ok Anis, NP  neomycin-polymyxin-pramoxine (NEOSPORIN PLUS) 1 % cream Apply 1 application topically as needed (rash).   Yes Historical Provider, MD  potassium chloride SA (K-DUR,KLOR-CON) 20 MEQ tablet Take 2 tablets (40 mEq total)  by mouth 2 (two) times daily. 02/23/13  Yes Ok Anis, NP  Rivaroxaban (XARELTO) 20 MG TABS tablet Take 1 tablet (20 mg total) by mouth daily. 02/23/13  Yes Ok Anis, NP  spironolactone (ALDACTONE) 25 MG tablet Take 0.5 tablets (12.5 mg total) by mouth daily. 02/23/13  Yes Ok Anis, NP    Review of Systems  All other systems reviewed and are otherwise negative except as noted above.  Physical Exam  Blood pressure 118/82, pulse 105, height 5\' 2"  (1.575 m),  weight 192 lb (87.091 kg).  General: Pleasant, NAD Psych: Normal affect. Neuro: Alert and oriented X 3. Moves all extremities spontaneously. HEENT: Normal  Neck: Supple without bruits or JVD. Lungs:  Resp regular and unlabored, CTA. Heart: RRR no s3, s4, or murmurs. Abdomen: Soft, obese, non-tender, non-distended, BS + x 4.  Extremities: No clubbing, cyanosis, minimal B/L edema and erythema. DP/PT/Radials 2+ and equal bilaterally.  Accessory Clinical Findings  2D Echocardiogram 9.9.2014  Study Conclusions  - Left ventricle: Diffuse hypokinesis worse in the inferior wall The cavity size was normal. Wall thickness was normal. Systolic function was moderately to severely reduced. The estimated ejection fraction was in the range of 30% to 35%. Diffuse hypokinesis. - Left atrium: The atrium was moderately dilated. - Right atrium: The atrium was moderately dilated. - Atrial septum: No defect or patent foramen ovale was identified.  _____________   ASSESSMENT AND PLAN:   67 year old female with new dg of CHF, admitted with A-fib with RVR   1. Afib RVR: S/p TEE and 2 attempted DCCV on 02/18/13 and 02/22/13. Pt was unable to maintain sinus rhythm for longer than few seconds. Her left atrium was noted to be severely dilated at 5.7 cm. She is currently rate controlled on Metoprolol and Digoxin. Anticoagulation with Xarelto it is very expensive for medications. The patient was given free samples for the next 2 weeks. She she currently has Medicare part D and she doesn't qualify for several under $5 a month. The patient will try to get a different plan, if that fails we will try to switch to a different anticoagulant starting in January.  2. Combined systolic and diastolic CHF, non-ischemic, EF 30%, most probably tachycardia induced. We will order echo now to see if there are any improvements. The patient appears euvolemic, she is stable on decreased dose of Lasix 60 mg daily only. We discussed  in length all patient's medications as there is a huge financial concern. We stressed out the importance of regular exercise own regaining his strength and explained exercise regimen but are appropriate heart failure patients.  . Hypertension - controlled on current regimen  Follow up in 3 months  Tobias Alexander, Rexene Edison, MD 04/06/2013, 11:22 AM

## 2013-04-07 DIAGNOSIS — M25559 Pain in unspecified hip: Secondary | ICD-10-CM | POA: Diagnosis not present

## 2013-04-07 DIAGNOSIS — M76899 Other specified enthesopathies of unspecified lower limb, excluding foot: Secondary | ICD-10-CM | POA: Diagnosis not present

## 2013-04-09 DIAGNOSIS — M76899 Other specified enthesopathies of unspecified lower limb, excluding foot: Secondary | ICD-10-CM | POA: Diagnosis not present

## 2013-04-21 ENCOUNTER — Encounter: Payer: Self-pay | Admitting: Cardiovascular Disease

## 2013-04-21 ENCOUNTER — Ambulatory Visit (HOSPITAL_COMMUNITY): Payer: Medicare Other | Attending: Family Medicine

## 2013-04-21 DIAGNOSIS — Z9010 Acquired absence of unspecified breast and nipple: Secondary | ICD-10-CM | POA: Diagnosis not present

## 2013-04-21 DIAGNOSIS — I428 Other cardiomyopathies: Secondary | ICD-10-CM

## 2013-04-21 DIAGNOSIS — Z853 Personal history of malignant neoplasm of breast: Secondary | ICD-10-CM | POA: Diagnosis not present

## 2013-04-21 DIAGNOSIS — I5041 Acute combined systolic (congestive) and diastolic (congestive) heart failure: Secondary | ICD-10-CM

## 2013-04-21 NOTE — Progress Notes (Signed)
Echocardiogram performed.  

## 2013-05-24 DIAGNOSIS — M76899 Other specified enthesopathies of unspecified lower limb, excluding foot: Secondary | ICD-10-CM | POA: Diagnosis not present

## 2013-06-30 NOTE — Progress Notes (Signed)
xarelto pa was approvedfor 06/30/2013-06/30/2014 VH#[id number] Pa# [id number]

## 2013-07-20 ENCOUNTER — Ambulatory Visit (INDEPENDENT_AMBULATORY_CARE_PROVIDER_SITE_OTHER): Payer: Medicare Other | Admitting: Cardiology

## 2013-07-20 ENCOUNTER — Encounter: Payer: Self-pay | Admitting: Cardiology

## 2013-07-20 VITALS — BP 132/84 | HR 38 | Ht 62.0 in | Wt 167.4 lb

## 2013-07-20 DIAGNOSIS — Z79899 Other long term (current) drug therapy: Secondary | ICD-10-CM

## 2013-07-20 DIAGNOSIS — R634 Abnormal weight loss: Secondary | ICD-10-CM | POA: Insufficient documentation

## 2013-07-20 DIAGNOSIS — R0602 Shortness of breath: Secondary | ICD-10-CM | POA: Diagnosis not present

## 2013-07-20 DIAGNOSIS — R630 Anorexia: Secondary | ICD-10-CM | POA: Insufficient documentation

## 2013-07-20 DIAGNOSIS — K625 Hemorrhage of anus and rectum: Secondary | ICD-10-CM

## 2013-07-20 DIAGNOSIS — I428 Other cardiomyopathies: Secondary | ICD-10-CM | POA: Diagnosis not present

## 2013-07-20 DIAGNOSIS — I509 Heart failure, unspecified: Secondary | ICD-10-CM

## 2013-07-20 LAB — CBC WITH DIFFERENTIAL/PLATELET
Basophils Absolute: 0 10*3/uL (ref 0.0–0.1)
Basophils Relative: 0.2 % (ref 0.0–3.0)
Eosinophils Absolute: 0.2 10*3/uL (ref 0.0–0.7)
Eosinophils Relative: 1.6 % (ref 0.0–5.0)
HCT: 43.8 % (ref 36.0–46.0)
Hemoglobin: 14.5 g/dL (ref 12.0–15.0)
Lymphocytes Relative: 26.9 % (ref 12.0–46.0)
Lymphs Abs: 2.6 10*3/uL (ref 0.7–4.0)
MCHC: 33 g/dL (ref 30.0–36.0)
MCV: 101.7 fl — ABNORMAL HIGH (ref 78.0–100.0)
Monocytes Absolute: 0.9 10*3/uL (ref 0.1–1.0)
Monocytes Relative: 9.6 % (ref 3.0–12.0)
Neutro Abs: 5.9 10*3/uL (ref 1.4–7.7)
Neutrophils Relative %: 61.7 % (ref 43.0–77.0)
Platelets: 277 10*3/uL (ref 150.0–400.0)
RBC: 4.31 Mil/uL (ref 3.87–5.11)
RDW: 13.5 % (ref 11.5–14.6)
WBC: 9.6 10*3/uL (ref 4.5–10.5)

## 2013-07-20 LAB — TSH: TSH: 5.95 u[IU]/mL — ABNORMAL HIGH (ref 0.35–5.50)

## 2013-07-20 LAB — COMPREHENSIVE METABOLIC PANEL
ALT: 12 U/L (ref 0–35)
AST: 17 U/L (ref 0–37)
Albumin: 4.1 g/dL (ref 3.5–5.2)
Alkaline Phosphatase: 54 U/L (ref 39–117)
BUN: 19 mg/dL (ref 6–23)
CO2: 27 mEq/L (ref 19–32)
Calcium: 9.7 mg/dL (ref 8.4–10.5)
Chloride: 101 mEq/L (ref 96–112)
Creatinine, Ser: 1.5 mg/dL — ABNORMAL HIGH (ref 0.4–1.2)
GFR: 37.64 mL/min — ABNORMAL LOW (ref >60.00)
Glucose, Bld: 81 mg/dL (ref 70–99)
Potassium: 4.5 mEq/L (ref 3.5–5.1)
Sodium: 137 mEq/L (ref 135–145)
Total Bilirubin: 1.1 mg/dL (ref 0.3–1.2)
Total Protein: 7.5 g/dL (ref 6.0–8.3)

## 2013-07-20 LAB — BRAIN NATRIURETIC PEPTIDE: Pro B Natriuretic peptide (BNP): 38 pg/mL (ref 0.0–100.0)

## 2013-07-20 MED ORDER — LOSARTAN POTASSIUM 25 MG PO TABS: 25.0000 mg | ORAL_TABLET | Freq: Every day | ORAL | Status: DC

## 2013-07-20 MED ORDER — POTASSIUM CHLORIDE CRYS ER 10 MEQ PO TBCR: 10.0000 meq | EXTENDED_RELEASE_TABLET | Freq: Every day | ORAL | Status: DC

## 2013-07-20 MED ORDER — FUROSEMIDE 40 MG PO TABS: 20.0000 mg | ORAL_TABLET | Freq: Every day | ORAL | Status: DC

## 2013-07-20 NOTE — Progress Notes (Signed)
Patient ID: Natasha Dickson, female   DOB: [birth year], 68 y.o.   MRN: [id number]    Patient Name: Natasha Dickson Date of Encounter: 07/20/2013  Primary Care Provider:  Elsie Stain, MD Primary Cardiologist:  Ena Dawley, H  Patient Profile  Follow up after hospitalization for CHF and new diagnosed a-fib  Problem List   Past Medical History  Diagnosis Date  . Breast cancer 1999    Stage I in left breast  . Closed fracture of unspecified part of neck of femur 2011    Right.  Soreness when standing. (Dr. Durward Fortes)  . Chronic systolic CHF (congestive heart failure)     a. 02/2013 Echo: EF 30-35%, diff HK, mod dil LA/RA.  Marland Kitchen Atrial fibrillation     a. 02/2013: s/p TEE and attempted cardioversion x 2-->failed-->rate controlled with bb/digoxin, Xarelto initiated.  . Nonischemic cardiomyopathy     a. 02/2013 Lexi CL: No ischemia, prob attenuation vs scar, EF 40%-->Med Rx.   Past Surgical History  Procedure Laterality Date  . Mastectomy  1999    Left breast with saline implant.  . Tubal ligation    . Tubal ligation    . Partial hip arthroplasty Right   . Tee without cardioversion N/A 02/18/2013    Procedure: TRANSESOPHAGEAL ECHOCARDIOGRAM (TEE);  Surgeon: Jolaine Artist, MD;  Location: [facility] ENDOSCOPY;  Service: Cardiovascular;  Laterality: N/A;  . Cardioversion N/A 02/18/2013    Procedure: CARDIOVERSION;  Surgeon: Jolaine Artist, MD;  Location: [facility] ENDOSCOPY;  Service: Cardiovascular;  Laterality: N/A;  . Cardioversion N/A 02/22/2013    Procedure: CARDIOVERSION;  Surgeon: Fay Records, MD;  Location: [facility] ENDOSCOPY;  Service: Cardiovascular;  Laterality: N/A;    Allergies  No Known Allergies  HPI  68 year old female that was discharged from [facility] 1 week ago. She was dg with combined systolic and diastolic CHF,  And A-fib with RVR.  She feels significantly better, SOB improved, so did the lower extremity swelling. She denies palpitations. The patient is inquiring about returning  to her part time job.  The patient is coming after 3 months. The patient states that she feels significantly better and stronger she is able to keep her part time job where she works 4 hours a day and doesn't feel short of breath and doesn't have chest pain. Her lower extremity edema has completely resolved. The patient lost 80 pounds since she was admitted to the hospital in September and 35 pounds just since the last visit in October of 2014. She says that her LE edema has resolved but lately she has lost her appetite and has to force herself to eat. She is compliant with Xarelto. She has chronic constipation with straining and since she has been on Xarelto she has had 2 episodes of bloody stools after straining. She denies any syncope. She complains of a chronic dry cough.  Home Medications  Prior to Admission medications   Medication Sig Start Date End Date Taking? Authorizing Provider  acetaminophen (TYLENOL) 500 MG tablet Take 1,000 mg by mouth every 6 (six) hours as needed for pain.   Yes Historical Provider, MD  digoxin (LANOXIN) 0.25 MG tablet Take 1 tablet (0.25 mg total) by mouth daily. 02/23/13  Yes Rogelia Mire, NP  furosemide (LASIX) 40 MG tablet Take 1.5 tablets (60 mg total) by mouth 2 (two) times daily. 02/23/13  Yes Rogelia Mire, NP  lisinopril (PRINIVIL,ZESTRIL) 2.5 MG tablet Take 1 tablet (2.5 mg total) by mouth daily.  02/23/13  Yes Rogelia Mire, NP  metoprolol succinate (TOPROL-XL) 50 MG 24 hr tablet Take 1 tablet (50 mg total) by mouth daily. Take with or immediately following a meal. 02/23/13  Yes Rogelia Mire, NP  neomycin-polymyxin-pramoxine (NEOSPORIN PLUS) 1 % cream Apply 1 application topically as needed (rash).   Yes Historical Provider, MD  potassium chloride SA (K-DUR,KLOR-CON) 20 MEQ tablet Take 2 tablets (40 mEq total) by mouth 2 (two) times daily. 02/23/13  Yes Rogelia Mire, NP  Rivaroxaban (XARELTO) 20 MG TABS tablet Take 1 tablet (20  mg total) by mouth daily. 02/23/13  Yes Rogelia Mire, NP  spironolactone (ALDACTONE) 25 MG tablet Take 0.5 tablets (12.5 mg total) by mouth daily. 02/23/13  Yes Rogelia Mire, NP    Review of Systems  All other systems reviewed and are otherwise negative except as noted above.  Physical Exam  Blood pressure 132/84, pulse 38, height 5\' 2"  (1.575 m), weight 167 lb 6.4 oz (75.932 kg).  General: Pleasant, NAD Psych: Normal affect. Neuro: Alert and oriented X 3. Moves all extremities spontaneously. HEENT: Normal  Neck: Supple without bruits or JVD. Lungs:  Resp regular and unlabored, CTA. Heart: RRR no s3, s4, or murmurs. Abdomen: Soft, obese, non-tender, non-distended, BS + x 4.  Extremities: No clubbing, cyanosis, no edema . DP/PT/Radials 2+ and equal bilaterally.  Accessory Clinical Findings  2D Echocardiogram 9.9.2014  Study Conclusions  - Left ventricle: Diffuse hypokinesis worse in the inferior wall The cavity size was normal. Wall thickness was normal. Systolic function was moderately to severely reduced. The estimated ejection fraction was in the range of 30% to 35%. Diffuse hypokinesis. - Left atrium: The atrium was moderately dilated. - Right atrium: The atrium was moderately dilated. - Atrial septum: No defect or patent foramen ovale was identified.  _____________  Echo 04/21/2013 Study Conclusions  - Left ventricle: Worse in the septum and apex The cavity size was normal. Wall thickness was increased in a pattern of mild LVH. Systolic function was mildly to moderately reduced. The estimated ejection fraction was in the range of 40% to 45%. Diffuse hypokinesis. - Right atrium: The atrium was mildly dilated.  EKG marked sinus bradycardia 38 beats per minute, ST and T wave abnormality suspicious for possible inferior and anterolateral ischemia possible digitalis effect.    ASSESSMENT AND PLAN:   68 year old female with new dg of CHF, admitted with  A-fib with RVR   1. Afib RVR: S/p TEE and 2 attempted DCCV on 02/18/13 and 02/22/13. Pt was unable to maintain sinus rhythm for longer than few seconds. Her left atrium was noted to be severely dilated at 5.7 cm. She is currently rate controlled on Metoprolol and Digoxin. Anticoagulation with Xarelto. Today's ECG shows sinus bradycardia with HR 38. We will stop Digoxin, cut Metoprolol to 25 mg for the next 5 days, then restart 50 mg po daily. Continue Xarelto for now.   2. Combined systolic and diastolic CHF, non-ischemic, EF 30%, most probably tachycardia induced, on repeated echo in November 2014 it was improved at 40-45% (? LA size). We will cut Lasix to 20 mg po daily, stop spironolactone, switch Lisinopril to Losartan 25 mg po daily (cough), decrease potassium to 10 mEq daily.   We will check CMP, TSH, CBC today. Order another echo to evaluate for function and LA size.  3.  Hypertension - controlled on current regimen  4. Significant weight loss, appetite loss, - we will refer to GI  specialist, she didn't see one in years, no screening colonoscopy on file  Follow up in 3 weeks  Ena Dawley, Lemmie Evens, MD 07/20/2013, 2:15 PM

## 2013-07-20 NOTE — Patient Instructions (Signed)
Your physician has requested that you have an echocardiogram. Echocardiography is a painless test that uses sound waves to create images of your heart. It provides your doctor with information about the size and shape of your heart and how well your heart's chambers and valves are working. This procedure takes approximately one hour. There are no restrictions for this procedure.  Your physician has recommended you make the following change in your medication: stop taking Digoxin, Spironolactone and Lisinopril. Start taking Losartan 25 mg daily. Decrease lasix to 20 mg daily and Potassium to 10 meq daily. Only take 1/2 dose ( 25 mg) of Metoprolol for the rest of the week then increase back to 50 mg daily.  Your physician recommends that you return for lab work in: today  Your physician is referring you to GI   Your physician recommends that you schedule a follow-up appointment in: 3 weeks

## 2013-07-21 ENCOUNTER — Telehealth: Payer: Self-pay | Admitting: Cardiology

## 2013-07-21 ENCOUNTER — Other Ambulatory Visit: Payer: Self-pay

## 2013-07-21 DIAGNOSIS — R7989 Other specified abnormal findings of blood chemistry: Secondary | ICD-10-CM

## 2013-07-21 LAB — DIGOXIN LEVEL: Digoxin Level: 2.75 ng/mL (ref 0.8–2.0)

## 2013-07-21 NOTE — Telephone Encounter (Signed)
New message ° ° ° ° °Pt want lab results °

## 2013-07-21 NOTE — Progress Notes (Signed)
Quick Note:  Patient notified of lab results. Patient verbalized agreement with current treatment plan and reiterated that she did stop the Digoxin yesterday. ______

## 2013-07-21 NOTE — Telephone Encounter (Signed)
Provided lab results to patient. She had nurse call her yesterday with the results but she stated she didn't really understand what it all meant and she wanted it broken down as to what each lab test was for and what was being looked at in each test. Provided basic overview of each lab purpose and results. Patient verbalized understanding and appreciation.  She denies further questions or any concerns.

## 2013-07-28 ENCOUNTER — Other Ambulatory Visit (INDEPENDENT_AMBULATORY_CARE_PROVIDER_SITE_OTHER): Payer: Medicare Other

## 2013-07-28 DIAGNOSIS — R799 Abnormal finding of blood chemistry, unspecified: Secondary | ICD-10-CM | POA: Diagnosis not present

## 2013-07-28 DIAGNOSIS — R946 Abnormal results of thyroid function studies: Secondary | ICD-10-CM

## 2013-07-28 DIAGNOSIS — R7989 Other specified abnormal findings of blood chemistry: Secondary | ICD-10-CM

## 2013-07-28 LAB — BASIC METABOLIC PANEL
BUN: 14 mg/dL (ref 6–23)
CO2: 27 mEq/L (ref 19–32)
Calcium: 9.2 mg/dL (ref 8.4–10.5)
Chloride: 106 mEq/L (ref 96–112)
Creatinine, Ser: 0.9 mg/dL (ref 0.4–1.2)
GFR: 65.47 mL/min (ref >60.00)
Glucose, Bld: 75 mg/dL (ref 70–99)
Potassium: 3.5 mEq/L (ref 3.5–5.1)
Sodium: 139 mEq/L (ref 135–145)

## 2013-07-28 LAB — T4, FREE: Free T4: 0.67 ng/dL (ref 0.60–1.60)

## 2013-07-30 ENCOUNTER — Telehealth: Payer: Self-pay | Admitting: Cardiology

## 2013-07-30 NOTE — Telephone Encounter (Signed)
**Note De-Identified  Obfuscation** The pt is advised of normal lab results, she verbalized understanding.

## 2013-07-30 NOTE — Telephone Encounter (Signed)
New message      Want lab results.  Pt took furosemide because legs were swelling.  Want to make sure that was ok

## 2013-08-11 ENCOUNTER — Other Ambulatory Visit (HOSPITAL_COMMUNITY): Payer: Medicare Other

## 2013-08-13 ENCOUNTER — Ambulatory Visit (HOSPITAL_COMMUNITY): Payer: Medicare Other | Attending: Cardiology | Admitting: Cardiology

## 2013-08-13 ENCOUNTER — Encounter: Payer: Self-pay | Admitting: Cardiology

## 2013-08-13 DIAGNOSIS — I5043 Acute on chronic combined systolic (congestive) and diastolic (congestive) heart failure: Secondary | ICD-10-CM | POA: Insufficient documentation

## 2013-08-13 DIAGNOSIS — I509 Heart failure, unspecified: Secondary | ICD-10-CM | POA: Diagnosis not present

## 2013-08-13 DIAGNOSIS — I4891 Unspecified atrial fibrillation: Secondary | ICD-10-CM | POA: Diagnosis not present

## 2013-08-13 DIAGNOSIS — I5041 Acute combined systolic (congestive) and diastolic (congestive) heart failure: Secondary | ICD-10-CM

## 2013-08-13 DIAGNOSIS — I428 Other cardiomyopathies: Secondary | ICD-10-CM | POA: Diagnosis not present

## 2013-08-13 DIAGNOSIS — I420 Dilated cardiomyopathy: Secondary | ICD-10-CM

## 2013-08-13 DIAGNOSIS — R0602 Shortness of breath: Secondary | ICD-10-CM | POA: Diagnosis not present

## 2013-08-13 NOTE — Progress Notes (Signed)
Echo performed. 

## 2013-08-17 ENCOUNTER — Ambulatory Visit: Payer: Medicare Other | Admitting: Cardiology

## 2013-08-18 ENCOUNTER — Telehealth: Payer: Self-pay | Admitting: Family Medicine

## 2013-08-18 MED ORDER — OSELTAMIVIR PHOSPHATE 75 MG PO CAPS: 75.0000 mg | ORAL_CAPSULE | Freq: Every day | ORAL | Status: DC

## 2013-08-18 NOTE — Telephone Encounter (Signed)
Patient notified as instructed by telephone. 

## 2013-08-18 NOTE — Telephone Encounter (Signed)
I would start the tamiflu.  Sent.  Take it once daily for prophylaxis.  Thanks.

## 2013-08-18 NOTE — Telephone Encounter (Signed)
Caller: Natasha Dickson/Patient; Phone: [phone]; Reason for Call: Natasha Dickson says her husband was diagnosed with the flu today; says she has h/o of Afib; was calling to see if she should be treated prophylactically; informed her that healthy pts w/o sxs are generally not indicated for treatment, but would like msg sent to MD; denies ANY sxs at this time; uses [facility] on [address]; please call back

## 2013-08-19 ENCOUNTER — Ambulatory Visit (INDEPENDENT_AMBULATORY_CARE_PROVIDER_SITE_OTHER): Payer: Medicare Other | Admitting: Cardiology

## 2013-08-19 ENCOUNTER — Encounter: Payer: Self-pay | Admitting: Cardiology

## 2013-08-19 VITALS — BP 110/56 | HR 80 | Ht 62.0 in | Wt 169.0 lb

## 2013-08-19 DIAGNOSIS — Z79899 Other long term (current) drug therapy: Secondary | ICD-10-CM | POA: Diagnosis not present

## 2013-08-19 NOTE — Patient Instructions (Signed)
Your physician recommends that you return for lab work today for bmet.   Your physician recommends that you schedule a follow-up appointment in: 3 months with Dr. Meda Coffee.

## 2013-08-19 NOTE — Progress Notes (Signed)
Patient ID: JIMMA ORTMAN, female   DOB: [birth year], 68 y.o.   MRN: [id number]    Patient Name: Natasha Dickson Date of Encounter: 08/19/2013  Primary Care Provider:  Elsie Stain, MD Primary Cardiologist:  Dorothy Spark  Problem List   Past Medical History  Diagnosis Date  . Breast cancer 1999    Stage I in left breast  . Closed fracture of unspecified part of neck of femur 2011    Right.  Soreness when standing. (Dr. Durward Fortes)  . Chronic systolic CHF (congestive heart failure)     a. 02/2013 Echo: EF 30-35%, diff HK, mod dil LA/RA.  Marland Kitchen Atrial fibrillation     a. 02/2013: s/p TEE and attempted cardioversion x 2-->failed-->rate controlled with bb/digoxin, Xarelto initiated.  . Nonischemic cardiomyopathy     a. 02/2013 Lexi CL: No ischemia, prob attenuation vs scar, EF 40%-->Med Rx.   Past Surgical History  Procedure Laterality Date  . Mastectomy  1999    Left breast with saline implant.  . Tubal ligation    . Tubal ligation    . Partial hip arthroplasty Right   . Tee without cardioversion N/A 02/18/2013    Procedure: TRANSESOPHAGEAL ECHOCARDIOGRAM (TEE);  Surgeon: Jolaine Artist, MD;  Location: [facility] ENDOSCOPY;  Service: Cardiovascular;  Laterality: N/A;  . Cardioversion N/A 02/18/2013    Procedure: CARDIOVERSION;  Surgeon: Jolaine Artist, MD;  Location: [facility] ENDOSCOPY;  Service: Cardiovascular;  Laterality: N/A;  . Cardioversion N/A 02/22/2013    Procedure: CARDIOVERSION;  Surgeon: Fay Records, MD;  Location: [facility] ENDOSCOPY;  Service: Cardiovascular;  Laterality: N/A;    Allergies  No Known Allergies  HPI  68 year old female that was discharged from [facility] 1 week ago. She was dg with combined systolic and diastolic CHF,  And A-fib with RVR.  She feels significantly better, SOB improved, so did the lower extremity swelling. She denies palpitations. The patient is inquiring about returning to her part time job.  07/20/2013 - The patient is coming after 3 months. The patient  states that she feels significantly better and stronger she is able to keep her part time job where she works 4 hours a day and doesn't feel short of breath and doesn't have chest pain. Her lower extremity edema has completely resolved. The patient lost 80 pounds since she was admitted to the hospital in September and 35 pounds just since the last visit in October of 2014. She says that her LE edema has resolved but lately she has lost her appetite and has to force herself to eat. She is compliant with Xarelto. She has chronic constipation with straining and since she has been on Xarelto she has had 2 episodes of bloody stools after straining. She denies any syncope. She complains of a chronic dry cough.  08/19/2013 - patient is coming stating that she feels significantly better. She is slowly I getting her energy back. She denies any significant shortness of breath. She still able to work part time. She denies any palpitation only admits very minimal lower extremity edema she overall is feels better. She has question about Tamiflu as her husband was diagnosed with type B influenza yesterday.  Home Medications  Prior to Admission medications   Medication Sig Start Date End Date Taking? Authorizing Provider  acetaminophen (TYLENOL) 500 MG tablet Take 1,000 mg by mouth every 6 (six) hours as needed for pain.   Yes Historical Provider, MD  digoxin (LANOXIN) 0.25 MG tablet Take 1 tablet (  0.25 mg total) by mouth daily. 02/23/13  Yes Rogelia Mire, NP  furosemide (LASIX) 40 MG tablet Take 1.5 tablets (60 mg total) by mouth 2 (two) times daily. 02/23/13  Yes Rogelia Mire, NP  lisinopril (PRINIVIL,ZESTRIL) 2.5 MG tablet Take 1 tablet (2.5 mg total) by mouth daily. 02/23/13  Yes Rogelia Mire, NP  metoprolol succinate (TOPROL-XL) 50 MG 24 hr tablet Take 1 tablet (50 mg total) by mouth daily. Take with or immediately following a meal. 02/23/13  Yes Rogelia Mire, NP    neomycin-polymyxin-pramoxine (NEOSPORIN PLUS) 1 % cream Apply 1 application topically as needed (rash).   Yes Historical Provider, MD  potassium chloride SA (K-DUR,KLOR-CON) 20 MEQ tablet Take 2 tablets (40 mEq total) by mouth 2 (two) times daily. 02/23/13  Yes Rogelia Mire, NP  Rivaroxaban (XARELTO) 20 MG TABS tablet Take 1 tablet (20 mg total) by mouth daily. 02/23/13  Yes Rogelia Mire, NP  spironolactone (ALDACTONE) 25 MG tablet Take 0.5 tablets (12.5 mg total) by mouth daily. 02/23/13  Yes Rogelia Mire, NP    Review of Systems  All other systems reviewed and are otherwise negative except as noted above.  Physical Exam  Blood pressure 110/56, pulse 80, height 5\' 2"  (1.575 m), weight 169 lb (76.658 kg). (previously 167lbs) General: Pleasant, NAD Psych: Normal affect. Neuro: Alert and oriented X 3. Moves all extremities spontaneously. HEENT: Normal  Neck: Supple without bruits or JVD. Lungs:  Resp regular and unlabored, CTA. Heart: RRR no s3, s4, or murmurs. Abdomen: Soft, obese, non-tender, non-distended, BS + x 4.  Extremities: No clubbing, cyanosis, no edema . DP/PT/Radials 2+ and equal bilaterally.  Accessory Clinical Findings  2D Echocardiogram 9.9.2014  Study Conclusions  - Left ventricle: Diffuse hypokinesis worse in the inferior wall The cavity size was normal. Wall thickness was normal. Systolic function was moderately to severely reduced. The estimated ejection fraction was in the range of 30% to 35%. Diffuse hypokinesis. - Left atrium: The atrium was moderately dilated. - Right atrium: The atrium was moderately dilated. - Atrial septum: No defect or patent foramen ovale was identified.  _____________  Echo 04/21/2013 Study Conclusions  - Left ventricle: Worse in the septum and apex The cavity size was normal. Wall thickness was increased in a pattern of mild LVH. Systolic function was mildly to moderately reduced. The estimated ejection  fraction was in the range of 40% to 45%. Diffuse hypokinesis. - Right atrium: The atrium was mildly dilated.  EKG marked sinus bradycardia 38 beats per minute, ST and T wave abnormality suspicious for possible inferior and anterolateral ischemia possible digitalis effect.  Echo 08/13/2013 Left ventricle: The cavity size was normal. Systolic function was normal. The estimated ejection fraction was in the range of 50% to 55%. Images were inadequate for LV wall motion assessment. ------------------------------------------------------------ Aortic valve: Poorly visualized. Trileaflet; normal thickness, mildly calcified leaflets. Mobility was not restricted. Doppler: Transvalvular velocity was within the normal range. There was no stenosis. No regurgitation. ------------------------------------------------------------ Aorta: Aortic root: The aortic root was normal in size. ------------------------------------------------------------ Mitral valve: Structurally normal valve. Mobility was not restricted. Doppler: Transvalvular velocity was within the normal range. There was no evidence for stenosis. Trivial regurgitation. Peak gradient: 39mm Hg (D). ------------------------------------------------------------ Left atrium: The atrium was normal in size. ------------------------------------------------------------ Right ventricle: The cavity size was normal. Wall thickness was normal. Systolic function was normal. ------------------------------------------------------------ Pulmonic valve: Structurally normal valve. Cusp separation was normal. Doppler: Transvalvular velocity was within the normal range.  There was no evidence for stenosis. No regurgitation. ------------------------------------------------------------ Tricuspid valve: Structurally normal valve. Doppler: Transvalvular velocity was within the normal range.  Mild regurgitation. ------------------------------------------------------------ Pulmonary artery: The main pulmonary artery was normal-sized. Systolic pressure was within the normal range.    ASSESSMENT AND PLAN:   68 year old female with new dg of CHF, admitted with A-fib with RVR   1. Afib RVR: S/p TEE and 2 attempted DCCV on 02/18/13 and 02/22/13. Pt was unable to maintain sinus rhythm for longer than few seconds. Her left atrium was noted to be severely dilated at 5.7 cm. She is currently rate controlled on Metoprolol and Digoxin. Anticoagulation with Xarelto. At the last visit her ECG showed sinus bradycardia with HR 38. We stopped Digoxin, cut Metoprolol to 25 mg for the next 5 days, then restarted 50 mg po daily. Continued Xarelto.  Her HR today is 80, she is in SR, we will continue Xarelto.  2. Combined systolic and diastolic CHF, non-ischemic,LVEF now back to normal 30--> 50-55%, it most probably tachycardia induced.  She is doing well on lower dose of Lasix, no weight change. Cough improved with switching Lisinopril to Losartan.   We will check BMP. At the last visit Crea/GFR back to normal. Low normal potassium.   3.  Hypertension - controlled on current regimen  4. Significant weight loss,improved appetite  Follow up in 3 months  Dorothy Spark, MD 08/19/2013, 4:23 PM

## 2013-08-20 LAB — BASIC METABOLIC PANEL
BUN: 20 mg/dL (ref 6–23)
CO2: 28 mEq/L (ref 19–32)
Calcium: 9.3 mg/dL (ref 8.4–10.5)
Chloride: 102 mEq/L (ref 96–112)
Creatinine, Ser: 1.1 mg/dL (ref 0.4–1.2)
GFR: 54.89 mL/min — ABNORMAL LOW (ref >60.00)
Glucose, Bld: 85 mg/dL (ref 70–99)
Potassium: 3.9 mEq/L (ref 3.5–5.1)
Sodium: 140 mEq/L (ref 135–145)

## 2013-09-29 ENCOUNTER — Other Ambulatory Visit: Payer: Self-pay | Admitting: *Deleted

## 2013-09-29 MED ORDER — METOPROLOL SUCCINATE ER 50 MG PO TB24: 50.0000 mg | ORAL_TABLET | Freq: Every day | ORAL | Status: DC

## 2013-11-10 ENCOUNTER — Other Ambulatory Visit: Payer: Self-pay | Admitting: Cardiology

## 2013-11-10 ENCOUNTER — Other Ambulatory Visit: Payer: Self-pay | Admitting: *Deleted

## 2013-11-10 MED ORDER — LOSARTAN POTASSIUM 25 MG PO TABS: 25.0000 mg | ORAL_TABLET | Freq: Every day | ORAL | Status: DC

## 2013-11-10 MED ORDER — RIVAROXABAN 20 MG PO TABS: 20.0000 mg | ORAL_TABLET | Freq: Every day | ORAL | Status: DC

## 2013-11-18 ENCOUNTER — Encounter: Payer: Self-pay | Admitting: *Deleted

## 2013-11-19 ENCOUNTER — Ambulatory Visit: Payer: Medicare Other | Admitting: Cardiology

## 2013-11-22 ENCOUNTER — Ambulatory Visit (INDEPENDENT_AMBULATORY_CARE_PROVIDER_SITE_OTHER): Payer: Medicare Other | Admitting: Cardiology

## 2013-11-22 ENCOUNTER — Encounter: Payer: Self-pay | Admitting: Cardiology

## 2013-11-22 VITALS — BP 124/64 | HR 74 | Ht 62.0 in | Wt 187.0 lb

## 2013-11-22 DIAGNOSIS — I4891 Unspecified atrial fibrillation: Secondary | ICD-10-CM | POA: Diagnosis not present

## 2013-11-22 NOTE — Patient Instructions (Signed)
Your physician recommends that you continue on your current medications as directed. Please refer to the Current Medication list given to you today.  Your physician recommends that you return for lab work in: today  Your physician wants you to follow-up in: 6 months. You will receive a reminder letter in the mail two months in advance. If you don't receive a letter, please call our office to schedule the follow-up appointment.   

## 2013-11-22 NOTE — Progress Notes (Signed)
Patient ID: HOA BRIGGS, female   DOB: 10-17-1945, 68 y.o.   MRN: [id number]    Patient Name: Natasha Dickson Date of Encounter: 11/22/2013  Primary Care Provider:  Elsie Stain, MD Primary Cardiologist:  Dorothy Spark  Problem List   Past Medical History  Diagnosis Date  . Breast cancer 1999    Stage I in left breast  . Closed fracture of unspecified part of neck of femur 2011    Right.  Soreness when standing. (Dr. Durward Fortes)  . Chronic systolic CHF (congestive heart failure)     a. 02/2013 Echo: EF 30-35%, diff HK, mod dil LA/RA.  Marland Kitchen Atrial fibrillation     a. 02/2013: s/p TEE and attempted cardioversion x 2-->failed-->rate controlled with bb/digoxin, Xarelto initiated.  . Nonischemic cardiomyopathy     a. 02/2013 Lexi CL: No ischemia, prob attenuation vs scar, EF 40%-->Med Rx.   Past Surgical History  Procedure Laterality Date  . Mastectomy  1999    Left breast with saline implant.  . Tubal ligation    . Tubal ligation    . Partial hip arthroplasty Right   . Tee without cardioversion N/A 02/18/2013    Procedure: TRANSESOPHAGEAL ECHOCARDIOGRAM (TEE);  Surgeon: Jolaine Artist, MD;  Location: [facility] ENDOSCOPY;  Service: Cardiovascular;  Laterality: N/A;  . Cardioversion N/A 02/18/2013    Procedure: CARDIOVERSION;  Surgeon: Jolaine Artist, MD;  Location: [facility] ENDOSCOPY;  Service: Cardiovascular;  Laterality: N/A;  . Cardioversion N/A 02/22/2013    Procedure: CARDIOVERSION;  Surgeon: Fay Records, MD;  Location: [facility] ENDOSCOPY;  Service: Cardiovascular;  Laterality: N/A;    Allergies  No Known Allergies  HPI  68 year old female that was discharged from [facility] 1 week ago. She was dg with combined systolic and diastolic CHF,  And A-fib with RVR.  She feels significantly better, SOB improved, so did the lower extremity swelling. She denies palpitations. The patient is inquiring about returning to her part time job.  07/20/2013 - The patient is coming after 3 months. The patient  states that she feels significantly better and stronger she is able to keep her part time job where she works 4 hours a day and doesn't feel short of breath and doesn't have chest pain. Her lower extremity edema has completely resolved. The patient lost 80 pounds since she was admitted to the hospital in September and 35 pounds just since the last visit in October of 2014. She says that her LE edema has resolved but lately she has lost her appetite and has to force herself to eat. She is compliant with Xarelto. She has chronic constipation with straining and since she has been on Xarelto she has had 2 episodes of bloody stools after straining. She denies any syncope. She complains of a chronic dry cough.  08/19/2013 - patient is coming stating that she feels significantly better. She is slowly I getting her energy back. She denies any significant shortness of breath. She still able to work part time. She denies any palpitation only admits very minimal lower extremity edema she overall is feels better. She has question about Tamiflu as her husband was diagnosed with type B influenza yesterday.  11/22/2013 - the patient is asymptomatic, she continues working part-time without any difficulties. She denies any dyspnea on exertion, chest pain or palpitations. She is compliant to her medications. She denies any palpitations or syncope. Her only problem is occasional lower extremity edema that she can control with extra Lasix. She overall  lost 80 pounds since the first admission for heart failure.  Home Medications  Prior to Admission medications   Medication Sig Start Date End Date Taking? Authorizing Provider  acetaminophen (TYLENOL) 500 MG tablet Take 1,000 mg by mouth every 6 (six) hours as needed for pain.   Yes Historical Provider, MD  digoxin (LANOXIN) 0.25 MG tablet Take 1 tablet (0.25 mg total) by mouth daily. 02/23/13  Yes Rogelia Mire, NP  furosemide (LASIX) 40 MG tablet Take 1.5 tablets (60 mg  total) by mouth 2 (two) times daily. 02/23/13  Yes Rogelia Mire, NP  lisinopril (PRINIVIL,ZESTRIL) 2.5 MG tablet Take 1 tablet (2.5 mg total) by mouth daily. 02/23/13  Yes Rogelia Mire, NP  metoprolol succinate (TOPROL-XL) 50 MG 24 hr tablet Take 1 tablet (50 mg total) by mouth daily. Take with or immediately following a meal. 02/23/13  Yes Rogelia Mire, NP  neomycin-polymyxin-pramoxine (NEOSPORIN PLUS) 1 % cream Apply 1 application topically as needed (rash).   Yes Historical Provider, MD  potassium chloride SA (K-DUR,KLOR-CON) 20 MEQ tablet Take 2 tablets (40 mEq total) by mouth 2 (two) times daily. 02/23/13  Yes Rogelia Mire, NP  Rivaroxaban (XARELTO) 20 MG TABS tablet Take 1 tablet (20 mg total) by mouth daily. 02/23/13  Yes Rogelia Mire, NP  spironolactone (ALDACTONE) 25 MG tablet Take 0.5 tablets (12.5 mg total) by mouth daily. 02/23/13  Yes Rogelia Mire, NP    Review of Systems  All other systems reviewed and are otherwise negative except as noted above.  Physical Exam  Blood pressure 124/64, pulse 74, height 5\' 2"  (1.575 m), weight 187 lb (84.823 kg), SpO2 96.00%. (previously 167lbs) General: Pleasant, NAD Psych: Normal affect. Neuro: Alert and oriented X 3. Moves all extremities spontaneously. HEENT: Normal  Neck: Supple without bruits or JVD. Lungs:  Resp regular and unlabored, CTA. Heart: RRR no s3, s4, or murmurs. Abdomen: Soft, obese, non-tender, non-distended, BS + x 4.  Extremities: No clubbing, cyanosis, no edema . DP/PT/Radials 2+ and equal bilaterally.  Accessory Clinical Findings  2D Echocardiogram 9.9.2014  Study Conclusions  - Left ventricle: Diffuse hypokinesis worse in the inferior wall The cavity size was normal. Wall thickness was normal. Systolic function was moderately to severely reduced. The estimated ejection fraction was in the range of 30% to 35%. Diffuse hypokinesis. - Left atrium: The atrium was moderately  dilated. - Right atrium: The atrium was moderately dilated. - Atrial septum: No defect or patent foramen ovale was identified.  _____________  Echo 04/21/2013 Study Conclusions  - Left ventricle: Worse in the septum and apex The cavity size was normal. Wall thickness was increased in a pattern of mild LVH. Systolic function was mildly to moderately reduced. The estimated ejection fraction was in the range of 40% to 45%. Diffuse hypokinesis. - Right atrium: The atrium was mildly dilated.  EKG marked sinus bradycardia 38 beats per minute, ST and T wave abnormality suspicious for possible inferior and anterolateral ischemia possible digitalis effect.  Echo 08/13/2013 Left ventricle: The cavity size was normal. Systolic function was normal. The estimated ejection fraction was in the range of 50% to 55%. Images were inadequate for LV wall motion assessment. ------------------------------------------------------------ Aortic valve: Poorly visualized. Trileaflet; normal thickness, mildly calcified leaflets. Mobility was not restricted. Doppler: Transvalvular velocity was within the normal range. There was no stenosis. No regurgitation. ------------------------------------------------------------ Aorta: Aortic root: The aortic root was normal in size. ------------------------------------------------------------ Mitral valve: Structurally normal valve. Mobility was not restricted.  Doppler: Transvalvular velocity was within the normal range. There was no evidence for stenosis. Trivial regurgitation. Peak gradient: 69mm Hg (D). ------------------------------------------------------------ Left atrium: The atrium was normal in size. ------------------------------------------------------------ Right ventricle: The cavity size was normal. Wall thickness was normal. Systolic function was normal. ------------------------------------------------------------ Pulmonic valve: Structurally normal  valve. Cusp separation was normal. Doppler: Transvalvular velocity was within the normal range. There was no evidence for stenosis. No regurgitation. ------------------------------------------------------------ Tricuspid valve: Structurally normal valve. Doppler: Transvalvular velocity was within the normal range. Mild regurgitation. ------------------------------------------------------------ Pulmonary artery: The main pulmonary artery was normal-sized. Systolic pressure was within the normal range.    ASSESSMENT AND PLAN:   68 year old female with new dg of CHF, admitted with A-fib with RVR   1. Afib RVR: S/p TEE and 2 attempted DCCV on 02/18/13 and 02/22/13. Pt was unable to maintain sinus rhythm for longer than few seconds. Her left atrium was noted to be severely dilated at 5.7 cm. She is currently rate controlled on Metoprolol and Digoxin. Anticoagulation with Xarelto. At the last visit her ECG showed sinus bradycardia with HR 38. We stopped Digoxin, cut Metoprolol to 25 mg for the next 5 days, then restarted 50 mg po daily. Continued Xarelto.  Her HR today is 80, she is in SR, we will continue Xarelto.  2. Combined systolic and diastolic CHF, non-ischemic,LVEF now back to normal 30--> 50-55%, it most probably tachycardia induced.  She is doing well on lower dose of Lasix, no weight change. Cough improved with switching Lisinopril to Losartan.   We will check BMP. At the last visit Crea/GFR back to normal. Low normal potassium.   3.  Hypertension - controlled on current regimen  4. Significant weight loss, improved appetite and functional capacity  Follow up in 6 months  Dorothy Spark, MD 11/22/2013, 2:17 PM

## 2013-11-26 ENCOUNTER — Other Ambulatory Visit (INDEPENDENT_AMBULATORY_CARE_PROVIDER_SITE_OTHER): Payer: Medicare Other

## 2013-11-26 DIAGNOSIS — I4891 Unspecified atrial fibrillation: Secondary | ICD-10-CM

## 2013-11-26 LAB — BASIC METABOLIC PANEL
BUN: 20 mg/dL (ref 6–23)
CO2: 26 mEq/L (ref 19–32)
Calcium: 9.4 mg/dL (ref 8.4–10.5)
Chloride: 103 mEq/L (ref 96–112)
Creatinine, Ser: 1 mg/dL (ref 0.4–1.2)
GFR: 58.66 mL/min — ABNORMAL LOW (ref >60.00)
Glucose, Bld: 86 mg/dL (ref 70–99)
Potassium: 4.1 mEq/L (ref 3.5–5.1)
Sodium: 138 mEq/L (ref 135–145)

## 2013-12-19 ENCOUNTER — Other Ambulatory Visit: Payer: Self-pay | Admitting: Cardiology

## 2013-12-20 ENCOUNTER — Other Ambulatory Visit: Payer: Self-pay

## 2013-12-20 MED ORDER — LOSARTAN POTASSIUM 25 MG PO TABS
25.0000 mg | ORAL_TABLET | Freq: Every day | ORAL | Status: DC
Start: 2013-12-20 — End: 2014-06-24

## 2014-01-05 ENCOUNTER — Telehealth: Payer: Self-pay

## 2014-01-05 NOTE — Telephone Encounter (Signed)
Patient called to get samples of xarelto placed samples up front 

## 2014-02-02 ENCOUNTER — Telehealth: Payer: Self-pay | Admitting: *Deleted

## 2014-02-02 ENCOUNTER — Telehealth: Payer: Self-pay | Admitting: Cardiology

## 2014-02-02 NOTE — Telephone Encounter (Signed)
error 

## 2014-02-02 NOTE — Telephone Encounter (Signed)
Xarelto samples placed at the front desk for pick up.

## 2014-02-21 DIAGNOSIS — Z23 Encounter for immunization: Secondary | ICD-10-CM | POA: Diagnosis not present

## 2014-03-07 ENCOUNTER — Telehealth: Payer: Self-pay | Admitting: *Deleted

## 2014-03-07 NOTE — Telephone Encounter (Signed)
Xarelto samples placed at the front desk.

## 2014-03-31 ENCOUNTER — Other Ambulatory Visit: Payer: Self-pay

## 2014-03-31 MED ORDER — METOPROLOL SUCCINATE ER 50 MG PO TB24: 50.0000 mg | ORAL_TABLET | Freq: Every day | ORAL | Status: DC

## 2014-04-04 ENCOUNTER — Telehealth: Payer: Self-pay | Admitting: Cardiology

## 2014-04-04 NOTE — Telephone Encounter (Signed)
°  Patient needs samples of Xerolto, please call and advise.

## 2014-05-03 ENCOUNTER — Telehealth: Payer: Self-pay | Admitting: *Deleted

## 2014-05-03 NOTE — Telephone Encounter (Signed)
Xarelto samples placed at the front desk for patient. 

## 2014-05-23 ENCOUNTER — Encounter: Payer: Self-pay | Admitting: Cardiology

## 2014-05-23 ENCOUNTER — Ambulatory Visit (INDEPENDENT_AMBULATORY_CARE_PROVIDER_SITE_OTHER): Payer: Medicare Other | Admitting: Cardiology

## 2014-05-23 VITALS — BP 110/64 | HR 138 | Ht 62.0 in | Wt 187.0 lb

## 2014-05-23 DIAGNOSIS — I10 Essential (primary) hypertension: Secondary | ICD-10-CM

## 2014-05-23 DIAGNOSIS — I4891 Unspecified atrial fibrillation: Secondary | ICD-10-CM

## 2014-05-23 DIAGNOSIS — I482 Chronic atrial fibrillation, unspecified: Secondary | ICD-10-CM

## 2014-05-23 DIAGNOSIS — I5043 Acute on chronic combined systolic (congestive) and diastolic (congestive) heart failure: Secondary | ICD-10-CM | POA: Diagnosis not present

## 2014-05-23 MED ORDER — DIGOXIN 125 MCG PO TABS: 0.0625 mg | ORAL_TABLET | Freq: Every day | ORAL | Status: DC

## 2014-05-23 MED ORDER — FUROSEMIDE 40 MG PO TABS: 40.0000 mg | ORAL_TABLET | Freq: Every day | ORAL | Status: DC

## 2014-05-23 NOTE — Progress Notes (Signed)
Patient ID: METZTLI SACHDEV, female   DOB: [birth year], 68 y.o.   MRN: [id number]    Patient Name: Natasha Dickson Date of Encounter: 05/23/2014  Primary Care Provider:  Elsie Stain, MD Primary Cardiologist:  Dorothy Spark  Problem List   Past Medical History  Diagnosis Date  . Breast cancer 1999    Stage I in left breast  . Closed fracture of unspecified part of neck of femur 2011    Right.  Soreness when standing. (Dr. Durward Fortes)  . Chronic systolic CHF (congestive heart failure)     a. 02/2013 Echo: EF 30-35%, diff HK, mod dil LA/RA.  Marland Kitchen Atrial fibrillation     a. 02/2013: s/p TEE and attempted cardioversion x 2-->failed-->rate controlled with bb/digoxin, Xarelto initiated.  . Nonischemic cardiomyopathy     a. 02/2013 Lexi CL: No ischemia, prob attenuation vs scar, EF 40%-->Med Rx.   Past Surgical History  Procedure Laterality Date  . Mastectomy  1999    Left breast with saline implant.  . Tubal ligation    . Tubal ligation    . Partial hip arthroplasty Right   . Tee without cardioversion N/A 02/18/2013    Procedure: TRANSESOPHAGEAL ECHOCARDIOGRAM (TEE);  Surgeon: Jolaine Artist, MD;  Location: [facility] ENDOSCOPY;  Service: Cardiovascular;  Laterality: N/A;  . Cardioversion N/A 02/18/2013    Procedure: CARDIOVERSION;  Surgeon: Jolaine Artist, MD;  Location: [facility] ENDOSCOPY;  Service: Cardiovascular;  Laterality: N/A;  . Cardioversion N/A 02/22/2013    Procedure: CARDIOVERSION;  Surgeon: Fay Records, MD;  Location: [facility] ENDOSCOPY;  Service: Cardiovascular;  Laterality: N/A;    Allergies  No Known Allergies  HPI  68 year old female that was discharged from [facility] 1 week ago. She was dg with combined systolic and diastolic CHF,  And A-fib with RVR.  She feels significantly better, SOB improved, so did the lower extremity swelling. She denies palpitations. The patient is inquiring about returning to her part time job.  07/20/2013 - The patient is coming after 3 months. The patient  states that she feels significantly better and stronger she is able to keep her part time job where she works 4 hours a day and doesn't feel short of breath and doesn't have chest pain. Her lower extremity edema has completely resolved. The patient lost 80 pounds since she was admitted to the hospital in September and 35 pounds just since the last visit in October of 2014. She says that her LE edema has resolved but lately she has lost her appetite and has to force herself to eat. She is compliant with Xarelto. She has chronic constipation with straining and since she has been on Xarelto she has had 2 episodes of bloody stools after straining. She denies any syncope. She complains of a chronic dry cough.  08/19/2013 - patient is coming stating that she feels significantly better. She is slowly I getting her energy back. She denies any significant shortness of breath. She still able to work part time. She denies any palpitation only admits very minimal lower extremity edema she overall is feels better. She has question about Tamiflu as her husband was diagnosed with type B influenza yesterday.  11/22/2013 - the patient is asymptomatic, she continues working part-time without any difficulties. She denies any dyspnea on exertion, chest pain or palpitations. She is compliant to her medications. She denies any palpitations or syncope. Her only problem is occasional lower extremity edema that she can control with extra Lasix. She overall  lost 80 pounds since the first admission for heart failure.  05/23/2014 -  The patient is coming after 6 months for follow-up of her atrial fibrillation and congestive heart failure. She states that she has been feeling well but she has gained 16 pounds. Sunday she knows that her lower extremity edema is more significant and she takes extra 20 mg of Lasix. She otherwise denies significant exertional, dyspnea, orthopnea or paroxysmal nocturnal dyspnea. She has noticed that her heart  rate has been increasing. She denies any palpitations or syncope.  Home Medications  Prior to Admission medications   Medication Sig Start Date End Date Taking? Authorizing Provider  acetaminophen (TYLENOL) 500 MG tablet Take 1,000 mg by mouth every 6 (six) hours as needed for pain.   Yes Historical Provider, MD  digoxin (LANOXIN) 0.25 MG tablet Take 1 tablet (0.25 mg total) by mouth daily. 02/23/13  Yes Rogelia Mire, NP  furosemide (LASIX) 40 MG tablet Take 1.5 tablets (60 mg total) by mouth 2 (two) times daily. 02/23/13  Yes Rogelia Mire, NP  lisinopril (PRINIVIL,ZESTRIL) 2.5 MG tablet Take 1 tablet (2.5 mg total) by mouth daily. 02/23/13  Yes Rogelia Mire, NP  metoprolol succinate (TOPROL-XL) 50 MG 24 hr tablet Take 1 tablet (50 mg total) by mouth daily. Take with or immediately following a meal. 02/23/13  Yes Rogelia Mire, NP  neomycin-polymyxin-pramoxine (NEOSPORIN PLUS) 1 % cream Apply 1 application topically as needed (rash).   Yes Historical Provider, MD  potassium chloride SA (K-DUR,KLOR-CON) 20 MEQ tablet Take 2 tablets (40 mEq total) by mouth 2 (two) times daily. 02/23/13  Yes Rogelia Mire, NP  Rivaroxaban (XARELTO) 20 MG TABS tablet Take 1 tablet (20 mg total) by mouth daily. 02/23/13  Yes Rogelia Mire, NP  spironolactone (ALDACTONE) 25 MG tablet Take 0.5 tablets (12.5 mg total) by mouth daily. 02/23/13  Yes Rogelia Mire, NP    Review of Systems  All other systems reviewed and are otherwise negative except as noted above.  Physical Exam BP 110/64, HR 138, W 187 (previously 171 lbs) General: Pleasant, NAD Psych: Normal affect. Neuro: Alert and oriented X 3. Moves all extremities spontaneously. HEENT: Normal  Neck: Supple without bruits or JVD. Lungs:  Resp regular and unlabored, CTA. Heart: RRR no s3, s4, or murmurs. Abdomen: Soft, obese, non-tender, non-distended, BS + x 4.  Extremities: No clubbing, cyanosis, mild B/L lower  extremity edema. DP/PT/Radials 2+ and equal bilaterally.  Accessory Clinical Findings  2D Echocardiogram 9.9.2014  Study Conclusions  - Left ventricle: Diffuse hypokinesis worse in the inferior wall The cavity size was normal. Wall thickness was normal. Systolic function was moderately to severely reduced. The estimated ejection fraction was in the range of 30% to 35%. Diffuse hypokinesis. - Left atrium: The atrium was moderately dilated. - Right atrium: The atrium was moderately dilated. - Atrial septum: No defect or patent foramen ovale was identified.  _____________  Echo 04/21/2013 Study Conclusions  - Left ventricle: Worse in the septum and apex The cavity size was normal. Wall thickness was increased in a pattern of mild LVH. Systolic function was mildly to moderately reduced. The estimated ejection fraction was in the range of 40% to 45%. Diffuse hypokinesis. - Right atrium: The atrium was mildly dilated.  EKG marked sinus bradycardia 38 beats per minute, ST and T wave abnormality suspicious for possible inferior and anterolateral ischemia possible digitalis effect.  Echo 08/13/2013 Left ventricle: The cavity size was normal. Systolic function  was normal. The estimated ejection fraction was in the range of 50% to 55%. Images were inadequate for LV wall motion assessment.      ASSESSMENT AND PLAN:   68 year old female with new dg of CHF, admitted with A-fib with RVR   1. Afib RVR: S/p TEE and 2 attempted DCCV on 02/18/13 and 02/22/13. Pt was unable to maintain sinus rhythm for longer than few seconds. Her left atrium was noted to be severely dilated at 5.7 cm. She was previously rate controlled on Metoprolol and Digoxin. Anticoagulation with Xarelto. In march 2015 her ECG showed sinus bradycardia with HR 38. We stopped Digoxin, cut Metoprolol to 25 mg for the next 5 days, then restarted 50 mg po daily. Continued Xarelto.  Today her heart rate is 138 bpm and she is  showing sings of congestion.  We will restart digoxin at a very low level 0.0625 mg daily follow-up in 3 weeks.  2. Combined systolic and diastolic CHF, non-ischemic - LVEF now back to normal 30--> 50-55%, it most probably tachycardia induced.  Her dry weight was 171 pounds however today she has extra 16 pounds currently 187 pounds. We will increase Lasix to 40 mg daily and follow-up in 3 weeks including BMP. Cough improved with switching Lisinopril to Losartan.   3.  Hypertension - controlled on current regimen  4. Obesity - Significant weight loss, improved appetite and functional capacity, overall 80 lbs, now 16 back.  Follow up in 3 weeks.  Dorothy Spark, MD 05/23/2014, 2:08 PM

## 2014-05-23 NOTE — Patient Instructions (Addendum)
Your physician recommends that you schedule a follow-up appointment in: [place] January   Your physician recommends that you return for lab work in: [place] January APPOINTMENT   Your physician has recommended you make the following change in your medication:  1) START DIGOXIN 0.0625 MG DAILY (THIS WILL BE HALF OF 0.125 MG TABLET)  2) INCREASE LASIX TO 40 MG DAILY

## 2014-05-25 ENCOUNTER — Telehealth: Payer: Self-pay

## 2014-05-25 NOTE — Telephone Encounter (Signed)
Patient was returning my call and I told her that I left sample of xarelto up front

## 2014-05-25 NOTE — Telephone Encounter (Signed)
[facility] will leave samples of xarelto at front desk

## 2014-06-21 ENCOUNTER — Other Ambulatory Visit (INDEPENDENT_AMBULATORY_CARE_PROVIDER_SITE_OTHER): Payer: Medicare Other | Admitting: *Deleted

## 2014-06-21 ENCOUNTER — Telehealth: Payer: Self-pay

## 2014-06-21 DIAGNOSIS — I482 Chronic atrial fibrillation, unspecified: Secondary | ICD-10-CM

## 2014-06-21 LAB — BASIC METABOLIC PANEL
BUN: 22 mg/dL (ref 6–23)
CO2: 25 mEq/L (ref 19–32)
Calcium: 9.3 mg/dL (ref 8.4–10.5)
Chloride: 103 mEq/L (ref 96–112)
Creatinine, Ser: 1.4 mg/dL — ABNORMAL HIGH (ref 0.4–1.2)
GFR: 40.04 mL/min — ABNORMAL LOW (ref >60.00)
Glucose, Bld: 88 mg/dL (ref 70–99)
Potassium: 4 mEq/L (ref 3.5–5.1)
Sodium: 135 mEq/L (ref 135–145)

## 2014-06-21 NOTE — Telephone Encounter (Signed)
Patient came to office for labs and asked for xarelto samples

## 2014-06-23 ENCOUNTER — Telehealth: Payer: Self-pay | Admitting: *Deleted

## 2014-06-23 MED ORDER — FUROSEMIDE 20 MG PO TABS: 20.0000 mg | ORAL_TABLET | Freq: Every day | ORAL | Status: DC

## 2014-06-23 NOTE — Telephone Encounter (Signed)
Called the pt to inform her that per Dr Meda Coffee she should decrease her lasix to 20 mg po daily. Confirmed the pharmacy of choice.  Pt verbalized understanding and agrees with this plan.

## 2014-06-23 NOTE — Telephone Encounter (Signed)
Contacted the pt about lab results per Dr Meda Coffee.  Informed the pt that per Dr Meda Coffee she would like to decrease her lasix to 60 mg po once daily.  Pt stated that she takes lasix 40 mg po daily, not 60 mg.  Informed the pt that I will route Dr Meda Coffee a message to advise on whether the pt should stay on 40 mg or should it be changed to a lower dose.  Informed the pt that I will follow-up with her thereafter with recommendations. Pt verbalized understanding and agrees with this plan.

## 2014-06-23 NOTE — Progress Notes (Signed)
Called the pt to inform her that per Dr Meda Coffee she should decrease her lasix to 20 mg po daily. Confirmed the pharmacy of choice.  Pt verbalized understanding and agrees with this plan.

## 2014-06-23 NOTE — Telephone Encounter (Signed)
I would decrease lasix to 20 mg po daily, sorry for the confusion, for some reason her med list is inaccurate

## 2014-06-23 NOTE — Telephone Encounter (Signed)
-----   Message from Dorothy Spark, MD sent at 06/22/2014  4:13 PM EST ----- She should decrease the lasix dose to 60 mg po once daily

## 2014-06-24 ENCOUNTER — Ambulatory Visit (INDEPENDENT_AMBULATORY_CARE_PROVIDER_SITE_OTHER): Payer: Medicare Other | Admitting: Cardiology

## 2014-06-24 ENCOUNTER — Encounter: Payer: Self-pay | Admitting: Cardiology

## 2014-06-24 VITALS — BP 110/69 | HR 96 | Ht 62.0 in | Wt 193.0 lb

## 2014-06-24 DIAGNOSIS — R0602 Shortness of breath: Secondary | ICD-10-CM

## 2014-06-24 DIAGNOSIS — I482 Chronic atrial fibrillation, unspecified: Secondary | ICD-10-CM

## 2014-06-24 DIAGNOSIS — I428 Other cardiomyopathies: Secondary | ICD-10-CM

## 2014-06-24 DIAGNOSIS — I5041 Acute combined systolic (congestive) and diastolic (congestive) heart failure: Secondary | ICD-10-CM | POA: Diagnosis not present

## 2014-06-24 DIAGNOSIS — I429 Cardiomyopathy, unspecified: Secondary | ICD-10-CM | POA: Diagnosis not present

## 2014-06-24 MED ORDER — METOPROLOL SUCCINATE ER 100 MG PO TB24: 100.0000 mg | ORAL_TABLET | Freq: Every day | ORAL | Status: DC

## 2014-06-24 MED ORDER — FUROSEMIDE 40 MG PO TABS: 40.0000 mg | ORAL_TABLET | Freq: Every day | ORAL | Status: DC

## 2014-06-24 NOTE — Progress Notes (Signed)
Patient ID: Natasha Dickson, female   DOB: Oct 20, 1945, 69 y.o.   MRN: [id number]   Patient Name: Natasha Dickson Date of Encounter: 06/24/2014  Primary Care Provider:  Elsie Stain, MD Primary Cardiologist:  Dorothy Spark  Problem List   Past Medical History  Diagnosis Date  . Breast cancer 1999    Stage I in left breast  . Closed fracture of unspecified part of neck of femur 2011    Right.  Soreness when standing. (Dr. Durward Fortes)  . Chronic systolic CHF (congestive heart failure)     a. 02/2013 Echo: EF 30-35%, diff HK, mod dil LA/RA.  Marland Kitchen Atrial fibrillation     a. 02/2013: s/p TEE and attempted cardioversion x 2-->failed-->rate controlled with bb/digoxin, Xarelto initiated.  . Nonischemic cardiomyopathy     a. 02/2013 Lexi CL: No ischemia, prob attenuation vs scar, EF 40%-->Med Rx.   Past Surgical History  Procedure Laterality Date  . Mastectomy  1999    Left breast with saline implant.  . Tubal ligation    . Tubal ligation    . Partial hip arthroplasty Right   . Tee without cardioversion N/A 02/18/2013    Procedure: TRANSESOPHAGEAL ECHOCARDIOGRAM (TEE);  Surgeon: Jolaine Artist, MD;  Location: [facility] ENDOSCOPY;  Service: Cardiovascular;  Laterality: N/A;  . Cardioversion N/A 02/18/2013    Procedure: CARDIOVERSION;  Surgeon: Jolaine Artist, MD;  Location: [facility] ENDOSCOPY;  Service: Cardiovascular;  Laterality: N/A;  . Cardioversion N/A 02/22/2013    Procedure: CARDIOVERSION;  Surgeon: Fay Records, MD;  Location: [facility] ENDOSCOPY;  Service: Cardiovascular;  Laterality: N/A;    Allergies  No Known Allergies  HPI  69 year old female that was discharged from [facility] 1 week ago. She was dg with combined systolic and diastolic CHF,  And A-fib with RVR.  She feels significantly better, SOB improved, so did the lower extremity swelling. She denies palpitations. The patient is inquiring about returning to her part time job.  07/20/2013 - The patient is coming after 3 months. The patient  states that she feels significantly better and stronger she is able to keep her part time job where she works 4 hours a day and doesn't feel short of breath and doesn't have chest pain. Her lower extremity edema has completely resolved. The patient lost 80 pounds since she was admitted to the hospital in September and 35 pounds just since the last visit in October of 2014. She says that her LE edema has resolved but lately she has lost her appetite and has to force herself to eat. She is compliant with Xarelto. She has chronic constipation with straining and since she has been on Xarelto she has had 2 episodes of bloody stools after straining. She denies any syncope. She complains of a chronic dry cough.  08/19/2013 - patient is coming stating that she feels significantly better. She is slowly I getting her energy back. She denies any significant shortness of breath. She still able to work part time. She denies any palpitation only admits very minimal lower extremity edema she overall is feels better. She has question about Tamiflu as her husband was diagnosed with type B influenza yesterday.  11/22/2013 - the patient is asymptomatic, she continues working part-time without any difficulties. She denies any dyspnea on exertion, chest pain or palpitations. She is compliant to her medications. She denies any palpitations or syncope. Her only problem is occasional lower extremity edema that she can control with extra Lasix. She overall lost  80 pounds since the first admission for heart failure.  05/23/2014 -  The patient is coming after 6 months for follow-up of her atrial fibrillation and congestive heart failure. She states that she has been feeling well but she has gained 16 pounds. Sunday she knows that her lower extremity edema is more significant and she takes extra 20 mg of Lasix. She otherwise denies significant exertional, dyspnea, orthopnea or paroxysmal nocturnal dyspnea. She has noticed that her heart  rate has been increasing. She denies any palpitations or syncope.  06/24/2014 - the patient is coming after 1 months, she states that she feels better however has noted that she had gained some weight over Christmas. She admits to drinking regular Coke frequently on a daily basis. She is compliant with her meds. She states that she just on had a cold and had a cough with mild fever. When she checks her heart rate at home she continues having heart rates in upper 90s.    Home Medications  Prior to Admission medications   Medication Sig Start Date End Date Taking? Authorizing Provider  acetaminophen (TYLENOL) 500 MG tablet Take 1,000 mg by mouth every 6 (six) hours as needed for pain.   Yes Historical Provider, MD  digoxin (LANOXIN) 0.25 MG tablet Take 1 tablet (0.25 mg total) by mouth daily. 02/23/13  Yes Rogelia Mire, NP  furosemide (LASIX) 40 MG tablet Take 1.5 tablets (60 mg total) by mouth 2 (two) times daily. 02/23/13  Yes Rogelia Mire, NP  lisinopril (PRINIVIL,ZESTRIL) 2.5 MG tablet Take 1 tablet (2.5 mg total) by mouth daily. 02/23/13  Yes Rogelia Mire, NP  metoprolol succinate (TOPROL-XL) 50 MG 24 hr tablet Take 1 tablet (50 mg total) by mouth daily. Take with or immediately following a meal. 02/23/13  Yes Rogelia Mire, NP  neomycin-polymyxin-pramoxine (NEOSPORIN PLUS) 1 % cream Apply 1 application topically as needed (rash).   Yes Historical Provider, MD  potassium chloride SA (K-DUR,KLOR-CON) 20 MEQ tablet Take 2 tablets (40 mEq total) by mouth 2 (two) times daily. 02/23/13  Yes Rogelia Mire, NP  Rivaroxaban (XARELTO) 20 MG TABS tablet Take 1 tablet (20 mg total) by mouth daily. 02/23/13  Yes Rogelia Mire, NP  spironolactone (ALDACTONE) 25 MG tablet Take 0.5 tablets (12.5 mg total) by mouth daily. 02/23/13  Yes Rogelia Mire, NP    Review of Systems  All other systems reviewed and are otherwise negative except as noted above.  Physical  Exam BP 110/64, HR 138, W 187 (previously 171 lbs) General: Pleasant, NAD Psych: Normal affect. Neuro: Alert and oriented X 3. Moves all extremities spontaneously. HEENT: Normal  Neck: Supple without bruits or JVD. Lungs:  Resp regular and unlabored, crackles at the bases. Heart: RRR no s3, s4, or murmurs. Abdomen: Soft, obese, non-tender, non-distended, BS + x 4.  Extremities: No clubbing, cyanosis, mild B/L lower extremity edema. DP/PT/Radials 2+ and equal bilaterally.  Accessory Clinical Findings  2D Echocardiogram 9.9.2014  Study Conclusions  - Left ventricle: Diffuse hypokinesis worse in the inferior wall The cavity size was normal. Wall thickness was normal. Systolic function was moderately to severely reduced. The estimated ejection fraction was in the range of 30% to 35%. Diffuse hypokinesis. - Left atrium: The atrium was moderately dilated. - Right atrium: The atrium was moderately dilated. - Atrial septum: No defect or patent foramen ovale was identified.  _____________  Echo 04/21/2013 Study Conclusions  - Left ventricle: Worse in the septum and apex  The cavity size was normal. Wall thickness was increased in a pattern of mild LVH. Systolic function was mildly to moderately reduced. The estimated ejection fraction was in the range of 40% to 45%. Diffuse hypokinesis. - Right atrium: The atrium was mildly dilated.  EKG marked sinus bradycardia 38 beats per minute, ST and T wave abnormality suspicious for possible inferior and anterolateral ischemia possible digitalis effect.  Echo 08/13/2013 Left ventricle: The cavity size was normal. Systolic function was normal. The estimated ejection fraction was in the range of 50% to 55%. Images were inadequate for LV wall motion assessment.    ASSESSMENT AND PLAN:   69 year old female with new dg of CHF, admitted with A-fib with RVR   1. Afib RVR: S/p TEE and 2 attempted DCCV on 02/18/13 and 02/22/13. Pt was unable to  maintain sinus rhythm for longer than few seconds. Her left atrium was noted to be severely dilated at 5.7 cm. She was previously rate controlled on Metoprolol and Digoxin. Anticoagulation with Xarelto. In march 2015 her ECG showed sinus bradycardia with HR 38. We stopped Digoxin, cut Metoprolol to 25 mg for the next 5 days, then restarted 50 mg po daily. Continued Xarelto.  In December she was found to be in A. fib with RVR and started on digoxin, her heart rate went down from 138 --> upper 90s however still tachycardic and there is a risk of recurrent tachycardia induced cardiomyopathy that she experienced last year. We will therefore increase Toprol-XL to 100 mg daily and to avoid hypotension, I will have to discontinue losartan.   2. Acute on chronic combined systolic and diastolic CHF, non-ischemic, tachycardia induced - LVEF now back to normal 30--> 50-55%, it most probably tachycardia induced. Today she has signs of fluid overload,  Her dry weight was 171 pounds, 187 in Dec 2015, today 193 lbs.  We will increase Lasix to 40 mg daily and follow-up in 4 weeks including BMP. Cough improved with switching Lisinopril to Losartan.   3.  Hypertension - controlled  4. Obesity - Significant weight loss, improved appetite and functional capacity, overall 80 lbs, now 16 back.  Follow up in 4 weeks.  Dorothy Spark, MD 06/24/2014, 2:40 PM

## 2014-06-24 NOTE — Patient Instructions (Addendum)
Your physician has recommended you make the following change in your medication:   STOP YOUR LOSARTAN NOW  START TAKING LASIX 40 MG ONCE DAILY  START TAKING TOPROL XL 100 MG ONCE DAILY   Your physician recommends that you return for lab work in: [place]--- (BMET)   Your physician recommends that you schedule a follow-up appointment in: [place]

## 2014-06-27 ENCOUNTER — Telehealth: Payer: Self-pay | Admitting: Cardiovascular Disease

## 2014-06-27 NOTE — Telephone Encounter (Signed)
Received request from Nurse fax box, documents faxed for surgical clearance. To: [facility] Fax number: [phone] Attention: 1.18.16/km

## 2014-07-07 ENCOUNTER — Telehealth: Payer: Self-pay | Admitting: *Deleted

## 2014-07-07 NOTE — Telephone Encounter (Signed)
Xarelto samples placed at the front desk for patient. 

## 2014-07-25 ENCOUNTER — Telehealth: Payer: Self-pay

## 2014-07-25 NOTE — Telephone Encounter (Signed)
Patient called to get samples of xarelto placed samples up front

## 2014-07-26 ENCOUNTER — Other Ambulatory Visit (INDEPENDENT_AMBULATORY_CARE_PROVIDER_SITE_OTHER): Payer: Medicare Other | Admitting: *Deleted

## 2014-07-26 DIAGNOSIS — I5041 Acute combined systolic (congestive) and diastolic (congestive) heart failure: Secondary | ICD-10-CM

## 2014-07-26 DIAGNOSIS — I429 Cardiomyopathy, unspecified: Secondary | ICD-10-CM

## 2014-07-26 DIAGNOSIS — I482 Chronic atrial fibrillation, unspecified: Secondary | ICD-10-CM

## 2014-07-26 DIAGNOSIS — I428 Other cardiomyopathies: Secondary | ICD-10-CM

## 2014-07-26 DIAGNOSIS — R0602 Shortness of breath: Secondary | ICD-10-CM | POA: Diagnosis not present

## 2014-07-26 LAB — BASIC METABOLIC PANEL
BUN: 17 mg/dL (ref 6–23)
CO2: 26 mEq/L (ref 19–32)
Calcium: 9.4 mg/dL (ref 8.4–10.5)
Chloride: 104 mEq/L (ref 96–112)
Creatinine, Ser: 1.28 mg/dL — ABNORMAL HIGH (ref 0.40–1.20)
GFR: 44.03 mL/min — ABNORMAL LOW (ref >60.00)
Glucose, Bld: 120 mg/dL — ABNORMAL HIGH (ref 70–99)
Potassium: 3.6 mEq/L (ref 3.5–5.1)
Sodium: 138 mEq/L (ref 135–145)

## 2014-07-28 ENCOUNTER — Other Ambulatory Visit: Payer: Medicare Other

## 2014-08-03 ENCOUNTER — Encounter: Payer: Self-pay | Admitting: Cardiology

## 2014-08-03 ENCOUNTER — Ambulatory Visit (INDEPENDENT_AMBULATORY_CARE_PROVIDER_SITE_OTHER): Payer: Medicare Other | Admitting: Cardiology

## 2014-08-03 VITALS — BP 100/62 | HR 80 | Ht 62.0 in | Wt 221.6 lb

## 2014-08-03 DIAGNOSIS — I5043 Acute on chronic combined systolic (congestive) and diastolic (congestive) heart failure: Secondary | ICD-10-CM

## 2014-08-03 DIAGNOSIS — I481 Persistent atrial fibrillation: Secondary | ICD-10-CM

## 2014-08-03 DIAGNOSIS — I10 Essential (primary) hypertension: Secondary | ICD-10-CM | POA: Diagnosis not present

## 2014-08-03 DIAGNOSIS — R0602 Shortness of breath: Secondary | ICD-10-CM

## 2014-08-03 DIAGNOSIS — I509 Heart failure, unspecified: Secondary | ICD-10-CM | POA: Diagnosis not present

## 2014-08-03 DIAGNOSIS — I4819 Other persistent atrial fibrillation: Secondary | ICD-10-CM

## 2014-08-03 LAB — BASIC METABOLIC PANEL
BUN: 14 mg/dL (ref 6–23)
CO2: 28 mEq/L (ref 19–32)
Calcium: 9.4 mg/dL (ref 8.4–10.5)
Chloride: 106 mEq/L (ref 96–112)
Creatinine, Ser: 1.02 mg/dL (ref 0.40–1.20)
GFR: 57.21 mL/min — ABNORMAL LOW (ref >60.00)
Glucose, Bld: 95 mg/dL (ref 70–99)
Potassium: 4.1 mEq/L (ref 3.5–5.1)
Sodium: 139 mEq/L (ref 135–145)

## 2014-08-03 LAB — BRAIN NATRIURETIC PEPTIDE: Pro B Natriuretic peptide (BNP): 192 pg/mL — ABNORMAL HIGH (ref 0.0–100.0)

## 2014-08-03 MED ORDER — FUROSEMIDE 40 MG PO TABS: 40.0000 mg | ORAL_TABLET | Freq: Two times a day (BID) | ORAL | Status: DC

## 2014-08-03 NOTE — Patient Instructions (Signed)
Your physician has recommended you make the following change in your medication:   INCREASE YOUR LASIX TO 40 MG TWICE DAILY---TAKE ONE TABLET AT 12 PM AND ONE TABLET AT 6 PM   Your physician has requested that you have an echocardiogram. Echocardiography is a painless test that uses sound waves to create images of your heart. It provides your doctor with information about the size and shape of your heart and how well your heart's chambers and valves are working. This procedure takes approximately one hour. There are no restrictions for this procedure.    Your physician recommends that you return for lab work in: TODAY ---BMET AND BNP   Your physician recommends that you schedule a follow-up appointment in: [place]

## 2014-08-03 NOTE — Progress Notes (Signed)
Patient ID: [name], female   DOB: Jul 27, 1945, 69 y.o.   MRN: [id number]    Patient Name: Natasha Dickson Date of Encounter: 08/03/2014  Primary Care Provider:  Elsie Stain, MD Primary Cardiologist:  Dorothy Spark  Problem List   Past Medical History  Diagnosis Date  . Breast cancer 1999    Stage I in left breast  . Closed fracture of unspecified part of neck of femur 2011    Right.  Soreness when standing. (Dr. Durward Fortes)  . Chronic systolic CHF (congestive heart failure)     a. 02/2013 Echo: EF 30-35%, diff HK, mod dil LA/RA.  Marland Kitchen Atrial fibrillation     a. 02/2013: s/p TEE and attempted cardioversion x 2-->failed-->rate controlled with bb/digoxin, Xarelto initiated.  . Nonischemic cardiomyopathy     a. 02/2013 Lexi CL: No ischemia, prob attenuation vs scar, EF 40%-->Med Rx.   Past Surgical History  Procedure Laterality Date  . Mastectomy  1999    Left breast with saline implant.  . Tubal ligation    . Tubal ligation    . Partial hip arthroplasty Right   . Tee without cardioversion N/A 02/18/2013    Procedure: TRANSESOPHAGEAL ECHOCARDIOGRAM (TEE);  Surgeon: Jolaine Artist, MD;  Location: [facility] ENDOSCOPY;  Service: Cardiovascular;  Laterality: N/A;  . Cardioversion N/A 02/18/2013    Procedure: CARDIOVERSION;  Surgeon: Jolaine Artist, MD;  Location: [facility] ENDOSCOPY;  Service: Cardiovascular;  Laterality: N/A;  . Cardioversion N/A 02/22/2013    Procedure: CARDIOVERSION;  Surgeon: Fay Records, MD;  Location: [facility] ENDOSCOPY;  Service: Cardiovascular;  Laterality: N/A;    Allergies  No Known Allergies  HPI  69 year old female that was discharged from [facility] 1 week ago. She was dg with combined systolic and diastolic CHF,  And A-fib with RVR.  She feels significantly better, SOB improved, so did the lower extremity swelling. She denies palpitations. The patient is inquiring about returning to her part time job.  07/20/2013 - The patient is coming after 3 months. The patient  states that she feels significantly better and stronger she is able to keep her part time job where she works 4 hours a day and doesn't feel short of breath and doesn't have chest pain. Her lower extremity edema has completely resolved. The patient lost 80 pounds since she was admitted to the hospital in September and 35 pounds just since the last visit in October of 2014. She says that her LE edema has resolved but lately she has lost her appetite and has to force herself to eat. She is compliant with Xarelto. She has chronic constipation with straining and since she has been on Xarelto she has had 2 episodes of bloody stools after straining. She denies any syncope. She complains of a chronic dry cough.  08/19/2013 - patient is coming stating that she feels significantly better. She is slowly I getting her energy back. She denies any significant shortness of breath. She still able to work part time. She denies any palpitation only admits very minimal lower extremity edema she overall is feels better. She has question about Tamiflu as her husband was diagnosed with type B influenza yesterday.  11/22/2013 - the patient is asymptomatic, she continues working part-time without any difficulties. She denies any dyspnea on exertion, chest pain or palpitations. She is compliant to her medications. She denies any palpitations or syncope. Her only problem is occasional lower extremity edema that she can control with extra Lasix. She overall  lost 80 pounds since the first admission for heart failure.  05/23/2014 -  The patient is coming after 6 months for follow-up of her atrial fibrillation and congestive heart failure. She states that she has been feeling well but she has gained 16 pounds. Sunday she knows that her lower extremity edema is more significant and she takes extra 20 mg of Lasix. She otherwise denies significant exertional, dyspnea, orthopnea or paroxysmal nocturnal dyspnea. She has noticed that her heart  rate has been increasing. She denies any palpitations or syncope.  06/24/2014 - the patient is coming after 1 months, she states that she feels better however has noted that she had gained some weight over Christmas. She admits to drinking regular Coke frequently on a daily basis. She is compliant with her meds. She states that she just on had a cold and had a cough with mild fever. When she checks her heart rate at home she continues having heart rates in upper 90s.  08/03/2014 - the patient is coming after 1 months, at the last visit her Lasix was increased to 40 mg daily as she has gained weight from 170 pounds to 193 pounds. Today she states that she has stable dyspnea on exertion with minimal baseline exertion, and no chest pain. She is not aware of her legs being more swollen however she feels increased abdominal girth and feels that her pains are more tight. She works part-time at [employer] approximately 3-4 hours a day and take her Lasix at night in order to avoid going to the bathroom during the day. She denies any palpitations or syncope.  Home Medications  Prior to Admission medications   Medication Sig Start Date End Date Taking? Authorizing Provider  acetaminophen (TYLENOL) 500 MG tablet Take 1,000 mg by mouth every 6 (six) hours as needed for pain.   Yes Historical Provider, MD  digoxin (LANOXIN) 0.25 MG tablet Take 1 tablet (0.25 mg total) by mouth daily. 02/23/13  Yes Rogelia Mire, NP  furosemide (LASIX) 40 MG tablet Take 1.5 tablets (60 mg total) by mouth 2 (two) times daily. 02/23/13  Yes Rogelia Mire, NP  lisinopril (PRINIVIL,ZESTRIL) 2.5 MG tablet Take 1 tablet (2.5 mg total) by mouth daily. 02/23/13  Yes Rogelia Mire, NP  metoprolol succinate (TOPROL-XL) 50 MG 24 hr tablet Take 1 tablet (50 mg total) by mouth daily. Take with or immediately following a meal. 02/23/13  Yes Rogelia Mire, NP  neomycin-polymyxin-pramoxine (NEOSPORIN PLUS) 1 % cream Apply  1 application topically as needed (rash).   Yes Historical Provider, MD  potassium chloride SA (K-DUR,KLOR-CON) 20 MEQ tablet Take 2 tablets (40 mEq total) by mouth 2 (two) times daily. 02/23/13  Yes Rogelia Mire, NP  Rivaroxaban (XARELTO) 20 MG TABS tablet Take 1 tablet (20 mg total) by mouth daily. 02/23/13  Yes Rogelia Mire, NP  spironolactone (ALDACTONE) 25 MG tablet Take 0.5 tablets (12.5 mg total) by mouth daily. 02/23/13  Yes Rogelia Mire, NP    Review of Systems  All other systems reviewed and are otherwise negative except as noted above.  PE:  BP: 100/62 mmHg, HR 80 BPM, Weight 222 lbs General: Pleasant, NAD Psych: Normal affect. Neuro: Alert and oriented X 3. Moves all extremities spontaneously. HEENT: Normal  Neck: Supple without bruits or JVD. Lungs:  Resp regular and unlabored, crackles at the bases. Heart: RRR no s3, s4, or murmurs. Abdomen: Soft, obese, non-tender, non-distended, BS + x 4.  Extremities:  No clubbing, cyanosis, mild B/L lower extremity edema. DP/PT/Radials 2+ and equal bilaterally.  Accessory Clinical Findings  2D Echocardiogram 9.9.2014  Study Conclusions  - Left ventricle: Diffuse hypokinesis worse in the inferior wall The cavity size was normal. Wall thickness was normal. Systolic function was moderately to severely reduced. The estimated ejection fraction was in the range of 30% to 35%. Diffuse hypokinesis. - Left atrium: The atrium was moderately dilated. - Right atrium: The atrium was moderately dilated. - Atrial septum: No defect or patent foramen ovale was identified.  _____________  Echo 04/21/2013 Study Conclusions  - Left ventricle: Worse in the septum and apex The cavity size was normal. Wall thickness was increased in a pattern of mild LVH. Systolic function was mildly to moderately reduced. The estimated ejection fraction was in the range of 40% to 45%. Diffuse hypokinesis. - Right atrium: The atrium was  mildly dilated.  EKG marked sinus bradycardia 38 beats per minute, ST and T wave abnormality suspicious for possible inferior and anterolateral ischemia possible digitalis effect.  Echo 08/13/2013 Left ventricle: The cavity size was normal. Systolic function was normal. The estimated ejection fraction was in the range of 50% to 55%. Images were inadequate for LV wall motion assessment.    ASSESSMENT AND PLAN:   69 year old female with new dg of CHF, admitted with A-fib with RVR   1. Acute on chronic combined systolic and diastolic CHF, non-ischemic, tachycardia induced - LVEF now back to normal 30--> 50-55% IN mARCH 2015. Patient's baseline weight was 170 pounds, at the last visit it was increased to 193 pounds, we have increased her Lasix to 40 mg daily however her weight went up again to 223 pounds. We will increase her Lasix to 40 mg orally twice a day her creatinine has been elevated at the last visit at 1.28 (baseline being 1.0. GFR is 44 that places her in CK D stage III category. We will also check BNP today and will monitor patient closely with a follow-up visit in 2 weeks. It is unclear what led 2 days acute decompensation, her clinical symptoms don't correlate with severity of the fluid retention. We will repeat echocardiogram to evaluate for her LVEF. He awoke with the patient the clinic and her O2 sats remained 94%. The patient admits to be diet high in sodium she is advised to change her diet. She is also advised on taking Lasix during the day instead of taking it at night.  2. Afib RVR: S/p TEE and 2 attempted DCCV on 02/18/13 and 02/22/13. Pt was unable to maintain sinus rhythm for longer than few seconds. Her left atrium was noted to be severely dilated at 5.7 cm. She was previously rate controlled on Metoprolol and Digoxin. Anticoagulation with Xarelto. In march 2015 her ECG showed sinus bradycardia with HR 38. We stopped Digoxin, cut Metoprolol to 25 mg for the next 5 days, then  restarted 50 mg po daily. Continued Xarelto.  In December she was found to be in A. fib with RVR and started on digoxin, her heart rate went down from 138 --> upper 90s however still tachycardic and there is a risk of recurrent tachycardia induced cardiomyopathy that she experienced last year. We increased ncrease Toprol-XL to 100 mg daily and to avoid hypotension, I will have to discontinue losartan.  She is nov rate controlled. On xarelto.  3.  Hypertension - controlled  4. Obesity - Significant weight loss, improved appetite and functional capacity, overall 80 lbs, now 16  back.  Follow up in 4 weeks.  Dorothy Spark, MD 08/03/2014, 12:52 PM

## 2014-08-04 ENCOUNTER — Telehealth: Payer: Self-pay | Admitting: Cardiology

## 2014-08-04 NOTE — Telephone Encounter (Signed)
Informed the pt that per Dr Meda Coffee she should increase the K in her diet for now, and we will recheck her K level at next [place] on 08/17/14.  Pt verbalized understanding and agrees with this plan.

## 2014-08-04 NOTE — Telephone Encounter (Signed)
LMTCB at 2 numbers listed on file.

## 2014-08-04 NOTE — Telephone Encounter (Signed)
I would just increase K in her diet for now, we will recheck when she comes back

## 2014-08-04 NOTE — Telephone Encounter (Signed)
New Msg         Pt requested a call back. No details given.

## 2014-08-04 NOTE — Telephone Encounter (Signed)
Pt calling to ask if she should increase her KCL, being that Dr Meda Coffee ordered for her to double her Lasix at [place].  Informed the pt that her K level was 4.1 yesterday , which is WNL.  Informed the pt that I will route this message to Dr Meda Coffee for further review and recommendation on KCl and follow-up with her thereafter with any new orders.  Advised the pt that in the meantime she should obtain a healthy intake of K through her diet.  Provided pt education with different food choices that contain K in it.  Pt verbalized understanding and agrees with this plan.

## 2014-08-05 ENCOUNTER — Ambulatory Visit (HOSPITAL_COMMUNITY): Payer: Medicare Other | Attending: Cardiovascular Disease | Admitting: Radiology

## 2014-08-05 DIAGNOSIS — I509 Heart failure, unspecified: Secondary | ICD-10-CM | POA: Diagnosis not present

## 2014-08-05 DIAGNOSIS — R0602 Shortness of breath: Secondary | ICD-10-CM

## 2014-08-05 NOTE — Progress Notes (Signed)
Echocardiogram performed.  

## 2014-08-16 ENCOUNTER — Telehealth: Payer: Self-pay | Admitting: Cardiology

## 2014-08-16 NOTE — Telephone Encounter (Signed)
Pt as appt with nelson tomorrow, needs samples of Xarelto 20 mg

## 2014-08-16 NOTE — Telephone Encounter (Signed)
Pt calling in regards to having Xarelto 20 mg tablet samples available at [facility] tomorrow 08/17/14 with Dr Meda Coffee.  Informed the pt that there will be samples available for pick-up at [place] with Dr Meda Coffee at 3:15 pm.  Pt verbalized understanding and gracious for all the assistance provided.

## 2014-08-17 ENCOUNTER — Encounter: Payer: Self-pay | Admitting: Cardiology

## 2014-08-17 ENCOUNTER — Ambulatory Visit (INDEPENDENT_AMBULATORY_CARE_PROVIDER_SITE_OTHER): Payer: Medicare Other | Admitting: Cardiology

## 2014-08-17 VITALS — BP 118/74 | HR 98 | Ht 62.0 in | Wt 217.0 lb

## 2014-08-17 DIAGNOSIS — I10 Essential (primary) hypertension: Secondary | ICD-10-CM | POA: Diagnosis not present

## 2014-08-17 DIAGNOSIS — I5022 Chronic systolic (congestive) heart failure: Secondary | ICD-10-CM | POA: Diagnosis not present

## 2014-08-17 DIAGNOSIS — I5023 Acute on chronic systolic (congestive) heart failure: Secondary | ICD-10-CM | POA: Diagnosis not present

## 2014-08-17 DIAGNOSIS — I481 Persistent atrial fibrillation: Secondary | ICD-10-CM

## 2014-08-17 DIAGNOSIS — I4819 Other persistent atrial fibrillation: Secondary | ICD-10-CM

## 2014-08-17 NOTE — Patient Instructions (Signed)
Your physician recommends that you continue on your current medications as directed. Please refer to the Current Medication list given to you today.    Your physician recommends that you schedule a follow-up appointment in: 3 MONTHS WITH DR NELSON  

## 2014-08-17 NOTE — Progress Notes (Signed)
Patient ID: Natasha Dickson, female   DOB: [birth year], 69 y.o.   MRN: [id number]    Patient Name: Natasha Dickson Date of Encounter: 08/17/2014  Primary Care Provider:  Elsie Stain, MD Primary Cardiologist:  Dorothy Spark  Chief complain: SOB, weight gain  Problem List   Past Medical History  Diagnosis Date  . Breast cancer 1999    Stage I in left breast  . Closed fracture of unspecified part of neck of femur 2011    Right.  Soreness when standing. (Dr. Durward Fortes)  . Chronic systolic CHF (congestive heart failure)     a. 02/2013 Echo: EF 30-35%, diff HK, mod dil LA/RA.  Marland Kitchen Atrial fibrillation     a. 02/2013: s/p TEE and attempted cardioversion x 2-->failed-->rate controlled with bb/digoxin, Xarelto initiated.  . Nonischemic cardiomyopathy     a. 02/2013 Lexi CL: No ischemia, prob attenuation vs scar, EF 40%-->Med Rx.   Past Surgical History  Procedure Laterality Date  . Mastectomy  1999    Left breast with saline implant.  . Tubal ligation    . Tubal ligation    . Partial hip arthroplasty Right   . Tee without cardioversion N/A 02/18/2013    Procedure: TRANSESOPHAGEAL ECHOCARDIOGRAM (TEE);  Surgeon: Jolaine Artist, MD;  Location: [facility] ENDOSCOPY;  Service: Cardiovascular;  Laterality: N/A;  . Cardioversion N/A 02/18/2013    Procedure: CARDIOVERSION;  Surgeon: Jolaine Artist, MD;  Location: [facility] ENDOSCOPY;  Service: Cardiovascular;  Laterality: N/A;  . Cardioversion N/A 02/22/2013    Procedure: CARDIOVERSION;  Surgeon: Fay Records, MD;  Location: [facility] ENDOSCOPY;  Service: Cardiovascular;  Laterality: N/A;    Allergies  No Known Allergies  HPI  69 year old female that was discharged from [facility] 1 week ago. She was dg with combined systolic and diastolic CHF,  And A-fib with RVR.  She feels significantly better, SOB improved, so did the lower extremity swelling. She denies palpitations. The patient is inquiring about returning to her part time job.  07/20/2013 - The patient is  coming after 3 months. The patient states that she feels significantly better and stronger she is able to keep her part time job where she works 4 hours a day and doesn't feel short of breath and doesn't have chest pain. Her lower extremity edema has completely resolved. The patient lost 80 pounds since she was admitted to the hospital in September and 35 pounds just since the last visit in October of 2014. She says that her LE edema has resolved but lately she has lost her appetite and has to force herself to eat. She is compliant with Xarelto. She has chronic constipation with straining and since she has been on Xarelto she has had 2 episodes of bloody stools after straining. She denies any syncope. She complains of a chronic dry cough.  08/19/2013 - patient is coming stating that she feels significantly better. She is slowly I getting her energy back. She denies any significant shortness of breath. She still able to work part time. She denies any palpitation only admits very minimal lower extremity edema she overall is feels better. She has question about Tamiflu as her husband was diagnosed with type B influenza yesterday.  11/22/2013 - the patient is asymptomatic, she continues working part-time without any difficulties. She denies any dyspnea on exertion, chest pain or palpitations. She is compliant to her medications. She denies any palpitations or syncope. Her only problem is occasional lower extremity edema that she can  control with extra Lasix. She overall lost 80 pounds since the first admission for heart failure.  05/23/2014 -  The patient is coming after 6 months for follow-up of her atrial fibrillation and congestive heart failure. She states that she has been feeling well but she has gained 16 pounds. Sunday she knows that her lower extremity edema is more significant and she takes extra 20 mg of Lasix. She otherwise denies significant exertional, dyspnea, orthopnea or paroxysmal nocturnal  dyspnea. She has noticed that her heart rate has been increasing. She denies any palpitations or syncope.  06/24/2014 - the patient is coming after 1 months, she states that she feels better however has noted that she had gained some weight over Christmas. She admits to drinking regular Coke frequently on a daily basis. She is compliant with her meds. She states that she just on had a cold and had a cough with mild fever. When she checks her heart rate at home she continues having heart rates in upper 90s.  08/03/2014 - the patient is coming after 1 months, at the last visit her Lasix was increased to 40 mg daily as she has gained weight from 170 pounds to 193 pounds. Today she states that she has stable dyspnea on exertion with minimal baseline exertion, and no chest pain. She is not aware of her legs being more swollen however she feels increased abdominal girth and feels that her pains are more tight. She works part-time at [employer] approximately 3-4 hours a day and take her Lasix at night in order to avoid going to the bathroom during the day. She denies any palpitations or syncope.  08/17/2014 - the patient was seen 2 weeks ago and was found to gain weight despite increased lasix to 80 mg po BID. Baseline weight 170 lbs, at the last visit 223 lbs. She had normal electrolytes, Crea and normal BNP of 192, previosuly 2900 in Sep 2014. The patient admitted to eating a lot of unhealthy food including a lot of sugary drinks and a lot of salt . Metolazone 2.5 mg po daily was added to her regimen. Today she states that she feels better with improved SOB and LE edema, no orthopnea or PND.   Home Medications  Prior to Admission medications   Medication Sig Start Date End Date Taking? Authorizing Provider  acetaminophen (TYLENOL) 500 MG tablet Take 1,000 mg by mouth every 6 (six) hours as needed for pain.   Yes Historical Provider, MD  digoxin (LANOXIN) 0.25 MG tablet Take 1 tablet (0.25 mg total) by  mouth daily. 02/23/13  Yes Rogelia Mire, NP  furosemide (LASIX) 40 MG tablet Take 1.5 tablets (60 mg total) by mouth 2 (two) times daily. 02/23/13  Yes Rogelia Mire, NP  lisinopril (PRINIVIL,ZESTRIL) 2.5 MG tablet Take 1 tablet (2.5 mg total) by mouth daily. 02/23/13  Yes Rogelia Mire, NP  metoprolol succinate (TOPROL-XL) 50 MG 24 hr tablet Take 1 tablet (50 mg total) by mouth daily. Take with or immediately following a meal. 02/23/13  Yes Rogelia Mire, NP  neomycin-polymyxin-pramoxine (NEOSPORIN PLUS) 1 % cream Apply 1 application topically as needed (rash).   Yes Historical Provider, MD  potassium chloride SA (K-DUR,KLOR-CON) 20 MEQ tablet Take 2 tablets (40 mEq total) by mouth 2 (two) times daily. 02/23/13  Yes Rogelia Mire, NP  Rivaroxaban (XARELTO) 20 MG TABS tablet Take 1 tablet (20 mg total) by mouth daily. 02/23/13  Yes Rogelia Mire, NP  spironolactone (ALDACTONE) 25 MG tablet Take 0.5 tablets (12.5 mg total) by mouth daily. 02/23/13  Yes Rogelia Mire, NP    Review of Systems  All other systems reviewed and are otherwise negative except as noted above.  PE:  BP: 100/62 mmHg, HR 80 BPM, Weight 222 lbs General: Pleasant, NAD Psych: Normal affect. Neuro: Alert and oriented X 3. Moves all extremities spontaneously. HEENT: Normal  Neck: Supple without bruits or JVD. Lungs:  Resp regular and unlabored, crackles at the bases. Heart: RRR no s3, s4, or murmurs. Abdomen: Soft, obese, non-tender, non-distended, BS + x 4.  Extremities: No clubbing, cyanosis, mild B/L lower extremity edema. DP/PT/Radials 2+ and equal bilaterally.  Accessory Clinical Findings  2D Echocardiogram 9.9.2014  Study Conclusions  - Left ventricle: Diffuse hypokinesis worse in the inferior wall The cavity size was normal. Wall thickness was normal. Systolic function was moderately to severely reduced. The estimated ejection fraction was in the range of 30% to 35%.  Diffuse hypokinesis. - Left atrium: The atrium was moderately dilated. - Right atrium: The atrium was moderately dilated. - Atrial septum: No defect or patent foramen ovale was identified.  _____________  Echo 04/21/2013 Study Conclusions  - Left ventricle: Worse in the septum and apex The cavity size was normal. Wall thickness was increased in a pattern of mild LVH. Systolic function was mildly to moderately reduced. The estimated ejection fraction was in the range of 40% to 45%. Diffuse hypokinesis. - Right atrium: The atrium was mildly dilated.  EKG marked sinus bradycardia 38 beats per minute, ST and T wave abnormality suspicious for possible inferior and anterolateral ischemia possible digitalis effect.  Echo 08/13/2013 Left ventricle: The cavity size was normal. Systolic function was normal. The estimated ejection fraction was in the range of 50% to 55%. Images were inadequate for LV wall motion assessment.    ASSESSMENT AND PLAN:   69 year old female with new dg of CHF, admitted with A-fib with RVR   1. Acute on chronic combined systolic and diastolic CHF, non-ischemic, tachycardia induced - LVEF now back to normal 30--> 50-55% IN mARCH 2015. Patient's baseline weight was 170 pounds, at the last visit it was increased to 193 pounds, we have increased her Lasix to 40 mg daily however her weight went up again to 223 pounds. We added metolazone with 6 lbs lost in 2 weeks, however this appear to be related to poor diet and eating a lot of salt and sugary drinks. Crea is stable, we will continue the same regimen. Detailed education about proper diet provided.  2. Afib RVR: S/p TEE and 2 attempted DCCV on 02/18/13 and 02/22/13. Pt was unable to maintain sinus rhythm for longer than few seconds. Her left atrium was noted to be severely dilated at 5.7 cm. She was previously rate controlled on Metoprolol and Digoxin. Anticoagulation with Xarelto. In march 2015 her ECG showed sinus  bradycardia with HR 38. We stopped Digoxin, cut Metoprolol to 25 mg for the next 5 days, then restarted 50 mg po daily. Continued Xarelto.  In December 2015 she was found to be again in A. fib with RVR and started on digoxin, her heart rate is now controlled. On xarelto.  3.  Hypertension - controlled  4. Obesity - as above  Follow up in 3 months.  Dorothy Spark, MD 08/17/2014, 4:15 PM

## 2014-09-12 ENCOUNTER — Telehealth: Payer: Self-pay

## 2014-09-12 NOTE — Telephone Encounter (Signed)
Patient called wanting samples of xarelto placed at front desk

## 2014-10-07 ENCOUNTER — Telehealth: Payer: Self-pay | Admitting: *Deleted

## 2014-10-07 NOTE — Telephone Encounter (Signed)
Xarelto samples placed at the front desk for patient. 

## 2014-10-10 ENCOUNTER — Other Ambulatory Visit: Payer: Self-pay

## 2014-10-10 DIAGNOSIS — R0602 Shortness of breath: Secondary | ICD-10-CM

## 2014-10-10 DIAGNOSIS — I509 Heart failure, unspecified: Secondary | ICD-10-CM

## 2014-10-10 MED ORDER — FUROSEMIDE 40 MG PO TABS: ORAL_TABLET | ORAL | Status: DC

## 2014-10-10 MED ORDER — FUROSEMIDE 40 MG PO TABS: 40.0000 mg | ORAL_TABLET | Freq: Two times a day (BID) | ORAL | Status: DC

## 2014-10-12 ENCOUNTER — Telehealth: Payer: Self-pay

## 2014-10-12 NOTE — Telephone Encounter (Addendum)
Called [facility] to get a tier exception for patient' s xarelto refence #[id number]

## 2014-10-13 ENCOUNTER — Telehealth: Payer: Self-pay

## 2014-10-13 NOTE — Telephone Encounter (Signed)
Tier exception was denied because it could not got to another tier exception, I called the pharmacy to see how much her xarelto was and it is 20.00 a month for 30 tablets. So we should not have to give her any more samples

## 2014-10-18 ENCOUNTER — Telehealth: Payer: Self-pay

## 2014-10-18 NOTE — Telephone Encounter (Signed)
Samples of Xarelto 20mg  4 bottles given to patient per request.

## 2014-10-21 ENCOUNTER — Telehealth: Payer: Self-pay | Admitting: Cardiology

## 2014-10-21 ENCOUNTER — Ambulatory Visit (INDEPENDENT_AMBULATORY_CARE_PROVIDER_SITE_OTHER): Payer: Medicare Other | Admitting: Cardiology

## 2014-10-21 ENCOUNTER — Encounter: Payer: Self-pay | Admitting: Cardiology

## 2014-10-21 VITALS — BP 120/68 | HR 73 | Ht 62.0 in | Wt 214.0 lb

## 2014-10-21 DIAGNOSIS — I420 Dilated cardiomyopathy: Secondary | ICD-10-CM

## 2014-10-21 DIAGNOSIS — N182 Chronic kidney disease, stage 2 (mild): Secondary | ICD-10-CM

## 2014-10-21 DIAGNOSIS — I482 Chronic atrial fibrillation, unspecified: Secondary | ICD-10-CM

## 2014-10-21 DIAGNOSIS — I5042 Chronic combined systolic (congestive) and diastolic (congestive) heart failure: Secondary | ICD-10-CM | POA: Diagnosis not present

## 2014-10-21 DIAGNOSIS — N189 Chronic kidney disease, unspecified: Secondary | ICD-10-CM | POA: Insufficient documentation

## 2014-10-21 LAB — COMPREHENSIVE METABOLIC PANEL
ALT: 9 U/L (ref 0–35)
AST: 17 U/L (ref 0–37)
Albumin: 3.6 g/dL (ref 3.5–5.2)
Alkaline Phosphatase: 83 U/L (ref 39–117)
BUN: 14 mg/dL (ref 6–23)
CO2: 29 mEq/L (ref 19–32)
Calcium: 9.3 mg/dL (ref 8.4–10.5)
Chloride: 102 mEq/L (ref 96–112)
Creatinine, Ser: 1.09 mg/dL (ref 0.40–1.20)
GFR: 52.96 mL/min — ABNORMAL LOW (ref >60.00)
Glucose, Bld: 112 mg/dL — ABNORMAL HIGH (ref 70–99)
Potassium: 4.1 mEq/L (ref 3.5–5.1)
Sodium: 136 mEq/L (ref 135–145)
Total Bilirubin: 0.6 mg/dL (ref 0.2–1.2)
Total Protein: 7.1 g/dL (ref 6.0–8.3)

## 2014-10-21 NOTE — Patient Instructions (Signed)
Medication Instructions:   Your physician recommends that you continue on your current medications as directed. Please refer to the Current Medication list given to you today.   Labwork:  TODAY----CMET    Follow-Up:  4 MONTHS WITH DR Meda Coffee

## 2014-10-21 NOTE — Telephone Encounter (Signed)
Informed the pt that according to her chart in epic, that DNR is not indicated on her code status, therefore that would make her a full code status.  Pt verbalized understanding and appreciative for all the assistance provided.

## 2014-10-21 NOTE — Telephone Encounter (Signed)
New message      Pt signed a DNR form while in the hosp.   She want to change it.  Please call

## 2014-10-21 NOTE — Progress Notes (Signed)
Patient ID: [name], female   DOB: 1945/11/13, 69 y.o.   MRN: [id number]    Patient Name: Natasha Dickson Date of Encounter: 10/21/2014  Primary Care Provider:  Elsie Stain, MD Primary Cardiologist:  Dorothy Spark  Chief complain: SOB, weight gain  Problem List   Past Medical History  Diagnosis Date  . Breast cancer 1999    Stage I in left breast  . Closed fracture of unspecified part of neck of femur 2011    Right.  Soreness when standing. (Dr. Durward Fortes)  . Chronic systolic CHF (congestive heart failure)     a. 02/2013 Echo: EF 30-35%, diff HK, mod dil LA/RA.  Marland Kitchen Atrial fibrillation     a. 02/2013: s/p TEE and attempted cardioversion x 2-->failed-->rate controlled with bb/digoxin, Xarelto initiated.  . Nonischemic cardiomyopathy     a. 02/2013 Lexi CL: No ischemia, prob attenuation vs scar, EF 40%-->Med Rx.   Past Surgical History  Procedure Laterality Date  . Mastectomy  1999    Left breast with saline implant.  . Tubal ligation    . Tubal ligation    . Partial hip arthroplasty Right   . Tee without cardioversion N/A 02/18/2013    Procedure: TRANSESOPHAGEAL ECHOCARDIOGRAM (TEE);  Surgeon: Jolaine Artist, MD;  Location: [facility] ENDOSCOPY;  Service: Cardiovascular;  Laterality: N/A;  . Cardioversion N/A 02/18/2013    Procedure: CARDIOVERSION;  Surgeon: Jolaine Artist, MD;  Location: [facility] ENDOSCOPY;  Service: Cardiovascular;  Laterality: N/A;  . Cardioversion N/A 02/22/2013    Procedure: CARDIOVERSION;  Surgeon: Fay Records, MD;  Location: [facility] ENDOSCOPY;  Service: Cardiovascular;  Laterality: N/A;    Allergies  No Known Allergies  HPI  69 year old female that was discharged from [facility] 1 week ago. She was dg with combined systolic and diastolic CHF,  And A-fib with RVR.  She feels significantly better, SOB improved, so did the lower extremity swelling. She denies palpitations. The patient is inquiring about returning to her part time job.  07/20/2013 - The patient is  coming after 3 months. The patient states that she feels significantly better and stronger she is able to keep her part time job where she works 4 hours a day and doesn't feel short of breath and doesn't have chest pain. Her lower extremity edema has completely resolved. The patient lost 80 pounds since she was admitted to the hospital in September and 35 pounds just since the last visit in October of 2014. She says that her LE edema has resolved but lately she has lost her appetite and has to force herself to eat. She is compliant with Xarelto. She has chronic constipation with straining and since she has been on Xarelto she has had 2 episodes of bloody stools after straining. She denies any syncope. She complains of a chronic dry cough.  08/19/2013 - patient is coming stating that she feels significantly better. She is slowly I getting her energy back. She denies any significant shortness of breath. She still able to work part time. She denies any palpitation only admits very minimal lower extremity edema she overall is feels better. She has question about Tamiflu as her husband was diagnosed with type B influenza yesterday.  11/22/2013 - the patient is asymptomatic, she continues working part-time without any difficulties. She denies any dyspnea on exertion, chest pain or palpitations. She is compliant to her medications. She denies any palpitations or syncope. Her only problem is occasional lower extremity edema that she can  control with extra Lasix. She overall lost 80 pounds since the first admission for heart failure.  05/23/2014 -  The patient is coming after 6 months for follow-up of her atrial fibrillation and congestive heart failure. She states that she has been feeling well but she has gained 16 pounds. Sunday she knows that her lower extremity edema is more significant and she takes extra 20 mg of Lasix. She otherwise denies significant exertional, dyspnea, orthopnea or paroxysmal nocturnal  dyspnea. She has noticed that her heart rate has been increasing. She denies any palpitations or syncope.  06/24/2014 - the patient is coming after 1 months, she states that she feels better however has noted that she had gained some weight over Christmas. She admits to drinking regular Coke frequently on a daily basis. She is compliant with her meds. She states that she just on had a cold and had a cough with mild fever. When she checks her heart rate at home she continues having heart rates in upper 90s.  08/03/2014 - the patient is coming after 1 months, at the last visit her Lasix was increased to 40 mg daily as she has gained weight from 170 pounds to 193 pounds. Today she states that she has stable dyspnea on exertion with minimal baseline exertion, and no chest pain. She is not aware of her legs being more swollen however she feels increased abdominal girth and feels that her pains are more tight. She works part-time at [employer] approximately 3-4 hours a day and take her Lasix at night in order to avoid going to the bathroom during the day. She denies any palpitations or syncope.  08/17/2014 - the patient was seen 2 weeks ago and was found to gain weight despite increased lasix to 80 mg po BID. Baseline weight 170 lbs, at the last visit 223 lbs. She had normal electrolytes, Crea and normal BNP of 192, previosuly 2900 in Sep 2014. The patient admitted to eating a lot of unhealthy food including a lot of sugary drinks and a lot of salt . Metolazone 2.5 mg po daily was added to her regimen. Today she states that she feels better with improved SOB and LE edema, no orthopnea or PND.   10/21/14 - the patient is coming after 2 months, she feels well, denies CP, DOE, orthopnea, PND, LE edema. She continues to work part time and works in the garden, She is more careful about her diet and limits sodas to 1/day. No palpitations or syncope.   Home Medications  Prior to Admission medications   Medication  Sig Start Date End Date Taking? Authorizing Provider  acetaminophen (TYLENOL) 500 MG tablet Take 1,000 mg by mouth every 6 (six) hours as needed for pain.   Yes Historical Provider, MD  digoxin (LANOXIN) 0.25 MG tablet Take 1 tablet (0.25 mg total) by mouth daily. 02/23/13  Yes Rogelia Mire, NP  furosemide (LASIX) 40 MG tablet Take 1.5 tablets (60 mg total) by mouth 2 (two) times daily. 02/23/13  Yes Rogelia Mire, NP  lisinopril (PRINIVIL,ZESTRIL) 2.5 MG tablet Take 1 tablet (2.5 mg total) by mouth daily. 02/23/13  Yes Rogelia Mire, NP  metoprolol succinate (TOPROL-XL) 50 MG 24 hr tablet Take 1 tablet (50 mg total) by mouth daily. Take with or immediately following a meal. 02/23/13  Yes Rogelia Mire, NP  neomycin-polymyxin-pramoxine (NEOSPORIN PLUS) 1 % cream Apply 1 application topically as needed (rash).   Yes Historical Provider, MD  potassium chloride  SA (K-DUR,KLOR-CON) 20 MEQ tablet Take 2 tablets (40 mEq total) by mouth 2 (two) times daily. 02/23/13  Yes Rogelia Mire, NP  Rivaroxaban (XARELTO) 20 MG TABS tablet Take 1 tablet (20 mg total) by mouth daily. 02/23/13  Yes Rogelia Mire, NP  spironolactone (ALDACTONE) 25 MG tablet Take 0.5 tablets (12.5 mg total) by mouth daily. 02/23/13  Yes Rogelia Mire, NP    Review of Systems  All other systems reviewed and are otherwise negative except as noted above.  PE:  BP: 100/62 mmHg, HR 80 BPM, Weight 222 lbs General: Pleasant, NAD Psych: Normal affect. Neuro: Alert and oriented X 3. Moves all extremities spontaneously. HEENT: Normal  Neck: Supple without bruits or JVD. Lungs:  Resp regular and unlabored, crackles at the bases. Heart: RRR no s3, s4, or murmurs. Abdomen: Soft, obese, non-tender, non-distended, BS + x 4.  Extremities: No clubbing, cyanosis, mild B/L lower extremity edema. DP/PT/Radials 2+ and equal bilaterally.  Accessory Clinical Findings  2D Echocardiogram 9.9.2014  Study  Conclusions  - Left ventricle: Diffuse hypokinesis worse in the inferior wall The cavity size was normal. Wall thickness was normal. Systolic function was moderately to severely reduced. The estimated ejection fraction was in the range of 30% to 35%. Diffuse hypokinesis. - Left atrium: The atrium was moderately dilated. - Right atrium: The atrium was moderately dilated. - Atrial septum: No defect or patent foramen ovale was identified.  _____________  Echo 04/21/2013 Study Conclusions  - Left ventricle: Worse in the septum and apex The cavity size was normal. Wall thickness was increased in a pattern of mild LVH. Systolic function was mildly to moderately reduced. The estimated ejection fraction was in the range of 40% to 45%. Diffuse hypokinesis. - Right atrium: The atrium was mildly dilated.  EKG marked sinus bradycardia 38 beats per minute, ST and T wave abnormality suspicious for possible inferior and anterolateral ischemia possible digitalis effect.  Echo 08/13/2013 Left ventricle: The cavity size was normal. Systolic function was normal. The estimated ejection fraction was in the range of 50% to 55%. Images were inadequate for LV wall motion assessment.    ASSESSMENT AND PLAN:   69 year old female with new dg of CHF, admitted with A-fib with RVR   1. Chronic combined systolic and diastolic CHF, non-ischemic, tachycardia induced - LVEF now back to normal 30--> 50-55% in march 2015. Patient's baseline weight was 170 pounds, then went up to 224, no fluid overload on PE, BNP normal, she has not been complaint to healthy diet, now slowly improving, today 8 lbs down.  2. Afib RVR: S/p TEE and 2 attempted DCCV on 02/18/13 and 02/22/13. Pt was unable to maintain sinus rhythm for longer than few seconds. Her left atrium was noted to be severely dilated at 5.7 cm. She was previously rate controlled on Metoprolol and Digoxin. Anticoagulation with Xarelto. In march 2015 her ECG showed  sinus bradycardia with HR 38. We stopped Digoxin, cut Metoprolol to 25 mg for the next 5 days, then restarted 50 mg po daily. Continued Xarelto.  In December 2015 she was found to be again in A. fib with RVR and started on digoxin, her heart rate is now controlled. On xarelto.  3.  Hypertension - controlled  4. Obesity - encourage to continue exercising, advised about diet  5. CKD stage 2 - improved, we will recheck Crea today, for now we will continue lasix 40 mg po daily.  Follow up in 3 months.  Natasha Dickson,  Jamse Belfast, MD 10/21/2014, 9:37 AM

## 2014-11-17 ENCOUNTER — Ambulatory Visit: Payer: PRIVATE HEALTH INSURANCE | Admitting: Cardiology

## 2014-11-25 ENCOUNTER — Other Ambulatory Visit: Payer: Self-pay | Admitting: Cardiology

## 2014-11-25 ENCOUNTER — Telehealth: Payer: Self-pay | Admitting: Cardiology

## 2014-11-25 NOTE — Telephone Encounter (Signed)
Patient advised we do not have any samples of Xarelto, and that she would need to purchase it.

## 2014-11-25 NOTE — Telephone Encounter (Signed)
NEw Message  Pt calling about Xarelto samples. Please call back and discuss.

## 2014-12-01 ENCOUNTER — Telehealth: Payer: Self-pay

## 2014-12-01 NOTE — Telephone Encounter (Signed)
patient called for samples of xarelto placed samples up front.  After calling [employer] found out she does not have Part  D Went over patient assistant program with her and I will placed forms in with samples

## 2014-12-05 ENCOUNTER — Other Ambulatory Visit: Payer: Self-pay

## 2014-12-13 IMAGING — CR DG CHEST 1V PORT
1 series · 1 of 1 positions shown · non-contrast
Comparison: 02/15/2013

CLINICAL DATA: Right arm PICC

PORTABLE CHEST - 1 VIEW

[AP]
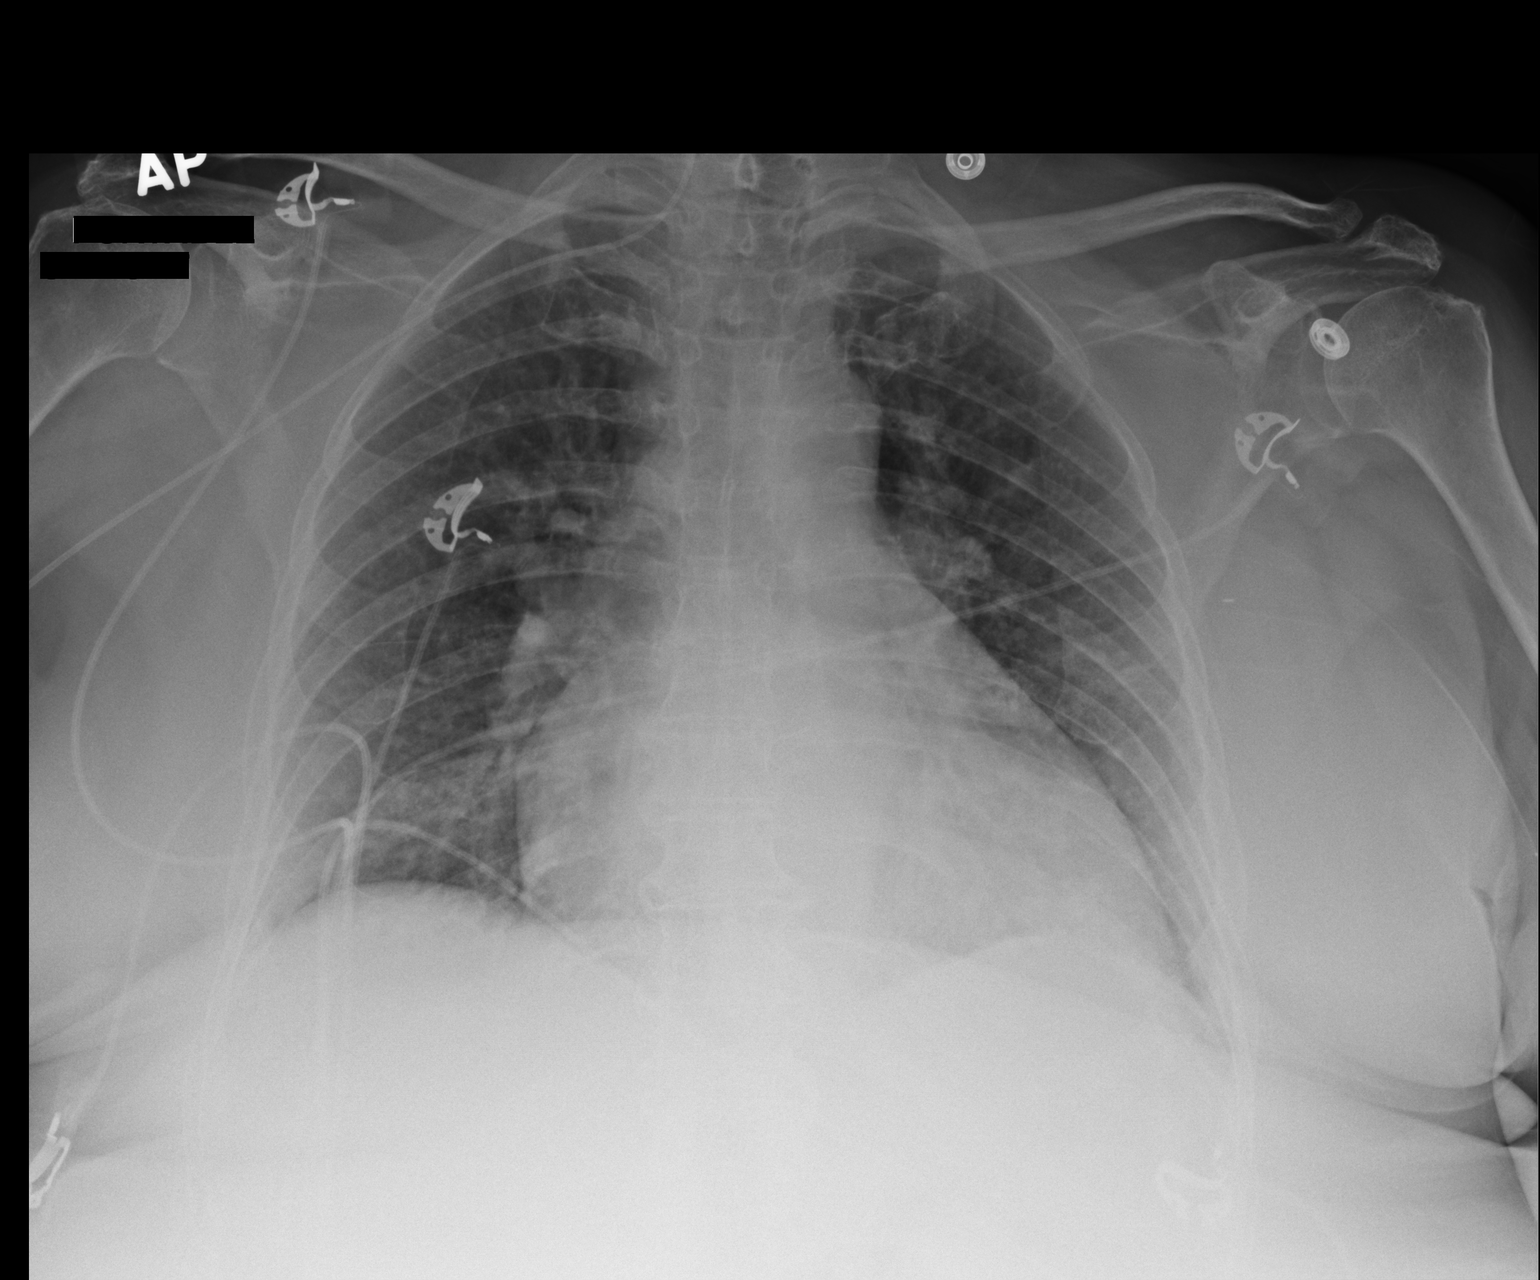

[1 of 1 positions shown; findings below may reference images not displayed]

FINDINGS: Pulmonary vascular congestion without frank interstitial
edema. No pleural effusion or pneumothorax.

Cardiomegaly.

Right arm PICC courses into the right neck and terminates above the
level of the imaged.
IMPRESSION: Malpositioned right arm PICC courses into the right neck.
Withdrawal/adjustment is suggested.

These results will be called to the ordering clinician or
representative by the Radiologist Assistant, and communication
documented in the PACS Dashboard.

## 2014-12-13 IMAGING — XA IR FLUORO GUIDE CV LINE*R*
1 series · 3 of 3 positions shown · non-contrast
Comparison: none

CLINICAL HISTORY: Malpositioned right arm PICC line.

[Series 1: run · 3 of 3 slices shown]
[im 1/3]
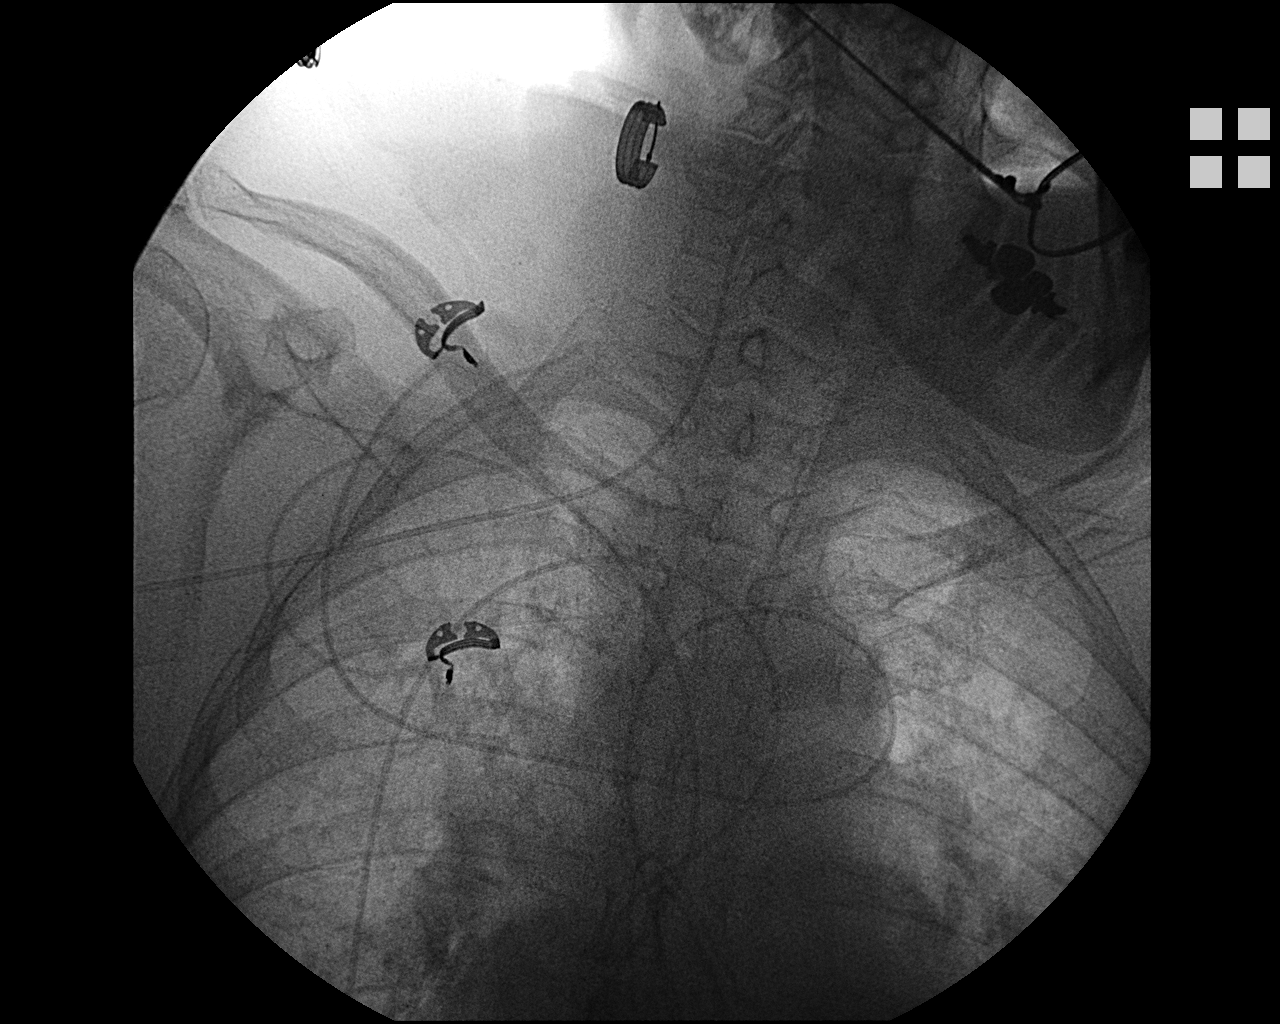
[im 2/3]
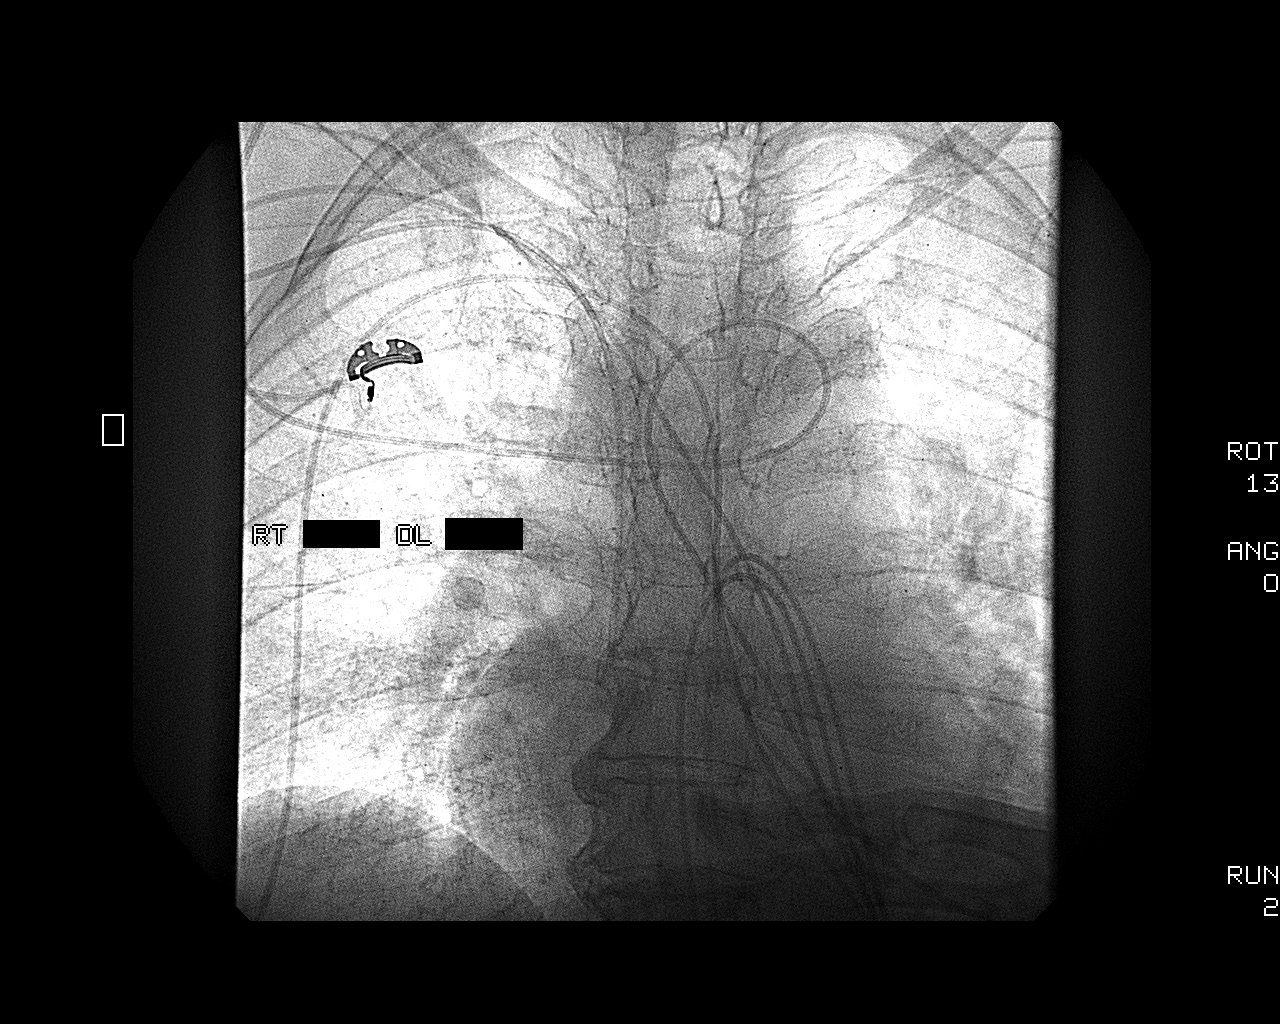
[im 3/3]
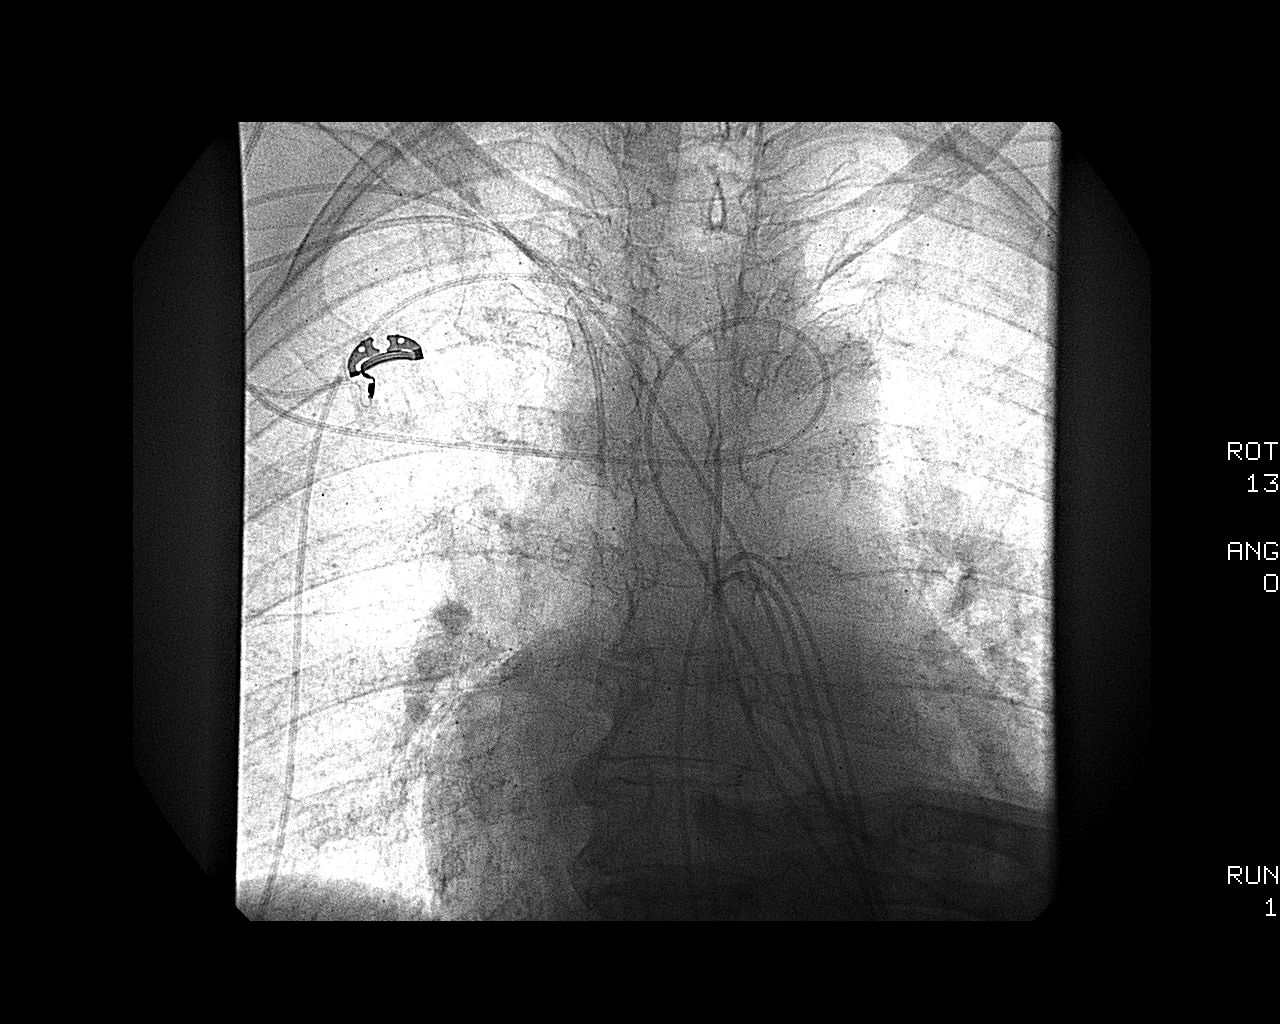

[3 of 3 positions shown; findings below may reference images not displayed]

PROCEDURE(S): EXCHANGE OF PICC LINE WITH FLUOROSCOPY

Medications:None

Moderate sedation time:None

Fluoroscopy time: 54 seconds

Procedure:The procedure was explained to the patient.  The risks
and benefits of the procedure were discussed and the patient's
questions were addressed.  Informed consent was obtained from the
patient.   The existing right arm PICC line was prepped and draped
in a sterile fashion.  Maximal barrier sterile technique was
utilized including caps, mask, sterile gowns, sterile gloves,
sterile drape, hand hygiene and skin antiseptic.  The catheter was
pulled back and cut.  Catheter was removed over a wire. A peel-away
sheath was placed.  A new dual lumen Power PICC line was cut to 40
cm and advanced  through the peel-away sheath.  Catheter was
positioned in the lower SVC.  Both lumens aspirated and flushed
well.  Catheter was secured to the skin. Fluoroscopic images were
taken and saved for this procedure.
FINDINGS: The old catheter was extending into the right neck.  The
new catheter tip is at the junction of the SVC and right atrium.

Complications: None
IMPRESSION: Successful exchange of the right arm PICC line with
fluoroscopy.

## 2014-12-19 ENCOUNTER — Other Ambulatory Visit: Payer: Self-pay | Admitting: Cardiology

## 2015-01-16 ENCOUNTER — Telehealth: Payer: Self-pay

## 2015-01-16 NOTE — Telephone Encounter (Signed)
Received a letter from [employer] [employer] program. Apparently they have not received patient's portion of application. I  Spoke with her just now and she asked if they require proof of income. I told yes, they probably do. She states "I'm just not interested in doing that". Will withdraw her name from program.

## 2015-02-16 ENCOUNTER — Other Ambulatory Visit: Payer: Self-pay | Admitting: Cardiology

## 2015-02-16 MED ORDER — POTASSIUM CHLORIDE CRYS ER 10 MEQ PO TBCR: 10.0000 meq | EXTENDED_RELEASE_TABLET | Freq: Every day | ORAL | Status: DC

## 2015-02-16 NOTE — Telephone Encounter (Signed)
Last office note list has that the patient takes 4meq bid, but pharmacy is requesting 50meq qd which the patient last refilled on 07/20/13. Please advise as to what patients current therapy should be. Thanks, MI

## 2015-02-16 NOTE — Telephone Encounter (Signed)
Refilled pts KCL for 10 mEq po daily as this is indicated on her most current medication list and confirmed with the pt that this is what she takes on a daily basis, along with her lasix.

## 2015-02-22 ENCOUNTER — Ambulatory Visit (INDEPENDENT_AMBULATORY_CARE_PROVIDER_SITE_OTHER): Payer: Medicare Other | Admitting: Cardiology

## 2015-02-22 ENCOUNTER — Encounter: Payer: Self-pay | Admitting: Cardiology

## 2015-02-22 VITALS — BP 126/78 | HR 110 | Ht 62.0 in | Wt 209.0 lb

## 2015-02-22 DIAGNOSIS — I5042 Chronic combined systolic (congestive) and diastolic (congestive) heart failure: Secondary | ICD-10-CM

## 2015-02-22 DIAGNOSIS — I10 Essential (primary) hypertension: Secondary | ICD-10-CM

## 2015-02-22 DIAGNOSIS — I4891 Unspecified atrial fibrillation: Secondary | ICD-10-CM | POA: Diagnosis not present

## 2015-02-22 DIAGNOSIS — R072 Precordial pain: Secondary | ICD-10-CM | POA: Diagnosis not present

## 2015-02-22 LAB — COMPREHENSIVE METABOLIC PANEL
ALT: 11 U/L (ref 0–35)
AST: 15 U/L (ref 0–37)
Albumin: 4 g/dL (ref 3.5–5.2)
Alkaline Phosphatase: 86 U/L (ref 39–117)
BUN: 21 mg/dL (ref 6–23)
CO2: 26 mEq/L (ref 19–32)
Calcium: 9.9 mg/dL (ref 8.4–10.5)
Chloride: 104 mEq/L (ref 96–112)
Creatinine, Ser: 1.26 mg/dL — ABNORMAL HIGH (ref 0.40–1.20)
GFR: 44.76 mL/min — ABNORMAL LOW (ref >60.00)
Glucose, Bld: 116 mg/dL — ABNORMAL HIGH (ref 70–99)
Potassium: 4.3 mEq/L (ref 3.5–5.1)
Sodium: 138 mEq/L (ref 135–145)
Total Bilirubin: 0.8 mg/dL (ref 0.2–1.2)
Total Protein: 7.5 g/dL (ref 6.0–8.3)

## 2015-02-22 NOTE — Patient Instructions (Signed)
Medication Instructions:   Your physician recommends that you continue on your current medications as directed. Please refer to the Current Medication list given to you today.    Labwork:  TODAY---CMET    Follow-Up:  Your physician wants you to follow-up in: [place] will receive a reminder letter in the mail two months in advance. If you don't receive a letter, please call our office to schedule the follow-up appointment.

## 2015-02-22 NOTE — Progress Notes (Signed)
Patient ID: SUDA FORBESS, female   DOB: April 09, 1946, 69 y.o.   MRN: [id number]    Patient Name: Natasha Dickson Date of Encounter: 02/22/2015  Primary Care Provider:  Elsie Stain, MD Primary Cardiologist:  Dorothy Spark  Chief complain: SOB, weight gain  Problem List   Past Medical History  Diagnosis Date  . Breast cancer 1999    Stage I in left breast  . Closed fracture of unspecified part of neck of femur 2011    Right.  Soreness when standing. (Dr. Durward Fortes)  . Chronic systolic CHF (congestive heart failure)     a. 02/2013 Echo: EF 30-35%, diff HK, mod dil LA/RA.  Marland Kitchen Atrial fibrillation     a. 02/2013: s/p TEE and attempted cardioversion x 2-->failed-->rate controlled with bb/digoxin, Xarelto initiated.  . Nonischemic cardiomyopathy     a. 02/2013 Lexi CL: No ischemia, prob attenuation vs scar, EF 40%-->Med Rx.   Past Surgical History  Procedure Laterality Date  . Mastectomy  1999    Left breast with saline implant.  . Tubal ligation    . Tubal ligation    . Partial hip arthroplasty Right   . Tee without cardioversion N/A 02/18/2013    Procedure: TRANSESOPHAGEAL ECHOCARDIOGRAM (TEE);  Surgeon: Jolaine Artist, MD;  Location: [facility] ENDOSCOPY;  Service: Cardiovascular;  Laterality: N/A;  . Cardioversion N/A 02/18/2013    Procedure: CARDIOVERSION;  Surgeon: Jolaine Artist, MD;  Location: [facility] ENDOSCOPY;  Service: Cardiovascular;  Laterality: N/A;  . Cardioversion N/A 02/22/2013    Procedure: CARDIOVERSION;  Surgeon: Fay Records, MD;  Location: [facility] ENDOSCOPY;  Service: Cardiovascular;  Laterality: N/A;    Allergies  No Known Allergies  HPI  69 year old female that was discharged from [facility] 1 week ago. She was dg with combined systolic and diastolic CHF,  And A-fib with RVR.  She feels significantly better, SOB improved, so did the lower extremity swelling. She denies palpitations. The patient is inquiring about returning to her part time job.  07/20/2013 - The patient is  coming after 3 months. The patient states that she feels significantly better and stronger she is able to keep her part time job where she works 4 hours a day and doesn't feel short of breath and doesn't have chest pain. Her lower extremity edema has completely resolved. The patient lost 80 pounds since she was admitted to the hospital in September and 35 pounds just since the last visit in October of 2014. She says that her LE edema has resolved but lately she has lost her appetite and has to force herself to eat. She is compliant with Xarelto. She has chronic constipation with straining and since she has been on Xarelto she has had 2 episodes of bloody stools after straining. She denies any syncope. She complains of a chronic dry cough.  08/19/2013 - patient is coming stating that she feels significantly better. She is slowly I getting her energy back. She denies any significant shortness of breath. She still able to work part time. She denies any palpitation only admits very minimal lower extremity edema she overall is feels better. She has question about Tamiflu as her husband was diagnosed with type B influenza yesterday.  11/22/2013 - the patient is asymptomatic, she continues working part-time without any difficulties. She denies any dyspnea on exertion, chest pain or palpitations. She is compliant to her medications. She denies any palpitations or syncope. Her only problem is occasional lower extremity edema that she can  control with extra Lasix. She overall lost 80 pounds since the first admission for heart failure.  05/23/2014 -  The patient is coming after 6 months for follow-up of her atrial fibrillation and congestive heart failure. She states that she has been feeling well but she has gained 16 pounds. Sunday she knows that her lower extremity edema is more significant and she takes extra 20 mg of Lasix. She otherwise denies significant exertional, dyspnea, orthopnea or paroxysmal nocturnal  dyspnea. She has noticed that her heart rate has been increasing. She denies any palpitations or syncope.  06/24/2014 - the patient is coming after 1 months, she states that she feels better however has noted that she had gained some weight over Christmas. She admits to drinking regular Coke frequently on a daily basis. She is compliant with her meds. She states that she just on had a cold and had a cough with mild fever. When she checks her heart rate at home she continues having heart rates in upper 90s.  08/03/2014 - the patient is coming after 1 months, at the last visit her Lasix was increased to 40 mg daily as she has gained weight from 170 pounds to 193 pounds. Today she states that she has stable dyspnea on exertion with minimal baseline exertion, and no chest pain. She is not aware of her legs being more swollen however she feels increased abdominal girth and feels that her pains are more tight. She works part-time at [employer] approximately 3-4 hours a day and take her Lasix at night in order to avoid going to the bathroom during the day. She denies any palpitations or syncope.  08/17/2014 - the patient was seen 2 weeks ago and was found to gain weight despite increased lasix to 80 mg po BID. Baseline weight 170 lbs, at the last visit 223 lbs. She had normal electrolytes, Crea and normal BNP of 192, previosuly 2900 in Sep 2014. The patient admitted to eating a lot of unhealthy food including a lot of sugary drinks and a lot of salt . Metolazone 2.5 mg po daily was added to her regimen. Today she states that she feels better with improved SOB and LE edema, no orthopnea or PND.   10/21/14 - the patient is coming after 2 months, she feels well, denies CP, DOE, orthopnea, PND, LE edema. She continues to work part time and works in the garden, She is more careful about her diet and limits sodas to 1/day. No palpitations or syncope.   02/22/2015 - she feels well, cut down soda intake to 1 can a day,  complaint with meds, no bleeding, no palpitations or syncope.She had one episode of chest pain earlier this summer that resolved with NTG.  Home Medications  Prior to Admission medications   Medication Sig Start Date End Date Taking? Authorizing Provider  acetaminophen (TYLENOL) 500 MG tablet Take 1,000 mg by mouth every 6 (six) hours as needed for pain.   Yes Historical Provider, MD  digoxin (LANOXIN) 0.25 MG tablet Take 1 tablet (0.25 mg total) by mouth daily. 02/23/13  Yes Rogelia Mire, NP  furosemide (LASIX) 40 MG tablet Take 1.5 tablets (60 mg total) by mouth 2 (two) times daily. 02/23/13  Yes Rogelia Mire, NP  lisinopril (PRINIVIL,ZESTRIL) 2.5 MG tablet Take 1 tablet (2.5 mg total) by mouth daily. 02/23/13  Yes Rogelia Mire, NP  metoprolol succinate (TOPROL-XL) 50 MG 24 hr tablet Take 1 tablet (50 mg total) by mouth daily.  Take with or immediately following a meal. 02/23/13  Yes Rogelia Mire, NP  neomycin-polymyxin-pramoxine (NEOSPORIN PLUS) 1 % cream Apply 1 application topically as needed (rash).   Yes Historical Provider, MD  potassium chloride SA (K-DUR,KLOR-CON) 20 MEQ tablet Take 2 tablets (40 mEq total) by mouth 2 (two) times daily. 02/23/13  Yes Rogelia Mire, NP  Rivaroxaban (XARELTO) 20 MG TABS tablet Take 1 tablet (20 mg total) by mouth daily. 02/23/13  Yes Rogelia Mire, NP  spironolactone (ALDACTONE) 25 MG tablet Take 0.5 tablets (12.5 mg total) by mouth daily. 02/23/13  Yes Rogelia Mire, NP    Review of Systems  All other systems reviewed and are otherwise negative except as noted above.  PE:  BP: 100/62 mmHg, HR 80 BPM, Weight 222 lbs General: Pleasant, NAD Psych: Normal affect. Neuro: Alert and oriented X 3. Moves all extremities spontaneously. HEENT: Normal  Neck: Supple without bruits or JVD. Lungs:  Resp regular and unlabored, crackles at the bases. Heart: RRR no s3, s4, or murmurs. Abdomen: Soft, obese, non-tender,  non-distended, BS + x 4.  Extremities: No clubbing, cyanosis, mild B/L lower extremity edema. DP/PT/Radials 2+ and equal bilaterally.  Accessory Clinical Findings  2D Echocardiogram 9.9.2014  Study Conclusions  - Left ventricle: Diffuse hypokinesis worse in the inferior wall The cavity size was normal. Wall thickness was normal. Systolic function was moderately to severely reduced. The estimated ejection fraction was in the range of 30% to 35%. Diffuse hypokinesis. - Left atrium: The atrium was moderately dilated. - Right atrium: The atrium was moderately dilated. - Atrial septum: No defect or patent foramen ovale was identified.  _____________  Echo 04/21/2013 Study Conclusions  - Left ventricle: Worse in the septum and apex The cavity size was normal. Wall thickness was increased in a pattern of mild LVH. Systolic function was mildly to moderately reduced. The estimated ejection fraction was in the range of 40% to 45%. Diffuse hypokinesis. - Right atrium: The atrium was mildly dilated.  EKG marked sinus bradycardia 38 beats per minute, ST and T wave abnormality suspicious for possible inferior and anterolateral ischemia possible digitalis effect.  Echo 08/13/2013 Left ventricle: The cavity size was normal. Systolic function was normal. The estimated ejection fraction was in the range of 50% to 55%. Images were inadequate for LV wall motion assessment.    ASSESSMENT AND PLAN:   70 year old female with new dg of CHF, admitted with A-fib with RVR   1. Chronic combined systolic and diastolic CHF, non-ischemic, tachycardia induced - LVEF now back to normal 30--> 50-55% in march 2015. Patient's baseline weight was 170 pounds, then went up to 224, now back to 209, slightly tachycardic, we will chcek BMP to see if she is dehydrated, appears euvolemic on physical exam.   2. Afib RVR: S/p TEE and 2 attempted DCCV on 02/18/13 and 02/22/13. Pt was unable to maintain sinus rhythm for  longer than few seconds. Her left atrium was noted to be severely dilated at 5.7 cm. She was previously rate controlled on Metoprolol and Digoxin. Anticoagulation with Xarelto. Continue metoprolol 100 mg po BID and digoxin 0.125 mg po daily. Continued Xarelto.  3.  Hypertension - controlled  4. Obesity - encourage to continue exercising, advised about diet  5. CKD stage 2 - improved, we will recheck Crea today, for now we will continue lasix 40 mg po daily.  6. Chest pain - negative T waves in the anterior and inferior leads are chronic,  negative stress test in 02/2013.  Follow up in 6 months.  Dorothy Spark, MD 02/22/2015, 3:23 PM

## 2015-02-23 ENCOUNTER — Telehealth: Payer: Self-pay | Admitting: Cardiology

## 2015-02-23 NOTE — Telephone Encounter (Signed)
New message ° ° ° ° °Pt returning your call °

## 2015-02-23 NOTE — Telephone Encounter (Signed)
Notified the pt to inform her that per Dr Meda Coffee her creatinine is showing that she is slightly dehydrated, and she should start taking her Lasix every other day for the next 2 weeks, then restart her normal daily dosage thereafter.  Pt verbalized understanding and agrees with this plan

## 2015-02-27 ENCOUNTER — Telehealth: Payer: Self-pay | Admitting: Cardiology

## 2015-02-27 MED ORDER — NITROGLYCERIN 0.4 MG SL SUBL: 0.4000 mg | SUBLINGUAL_TABLET | SUBLINGUAL | Status: DC | PRN

## 2015-02-27 NOTE — Telephone Encounter (Signed)
Yes, please prescribe.

## 2015-02-27 NOTE — Telephone Encounter (Signed)
New Message   Please call pt

## 2015-02-27 NOTE — Telephone Encounter (Signed)
Pt calling to ask Dr Meda Coffee if she could get a prescription for Nitroglycerin Sublingual 0.4 mg to take only as needed for chest pain.  Pt states she is not having any cp or has had any chest since her last OV with Dr Meda Coffee on 02/22/15.  Pt states at her 9/14 OV with Dr Meda Coffee, they had talked about the pt being prescribed SL Nitro to have on hand just incase she needed to utilize this medication for chest pain.  Pt states she is in clear understanding on how to take this medication correctly. Informed the pt that I will route this message to Dr Meda Coffee for further review and recommendation of medication requested, and follow-up with her thereafter. Pt gracious for all the help and agrees with this plan.

## 2015-02-27 NOTE — Telephone Encounter (Signed)
Spoke with the pt to inform her that per Dr Meda Coffee she has prescribed for her to have nitroglycerin 0.4 mg SL PRN for chest pain.  Confirmed the pharmacy of choice with the pt.  Pt verbalized understanding and agrees with this plan.

## 2015-03-09 DIAGNOSIS — Z23 Encounter for immunization: Secondary | ICD-10-CM | POA: Diagnosis not present

## 2015-03-17 ENCOUNTER — Telehealth: Payer: Self-pay | Admitting: Cardiology

## 2015-03-17 NOTE — Telephone Encounter (Signed)
Left message to call back  

## 2015-03-17 NOTE — Telephone Encounter (Signed)
New message     Pt saw Dr Meda Coffee in [place].  What can she take for a cough with congestion?  Can we call her in a presc?

## 2015-03-20 ENCOUNTER — Ambulatory Visit (INDEPENDENT_AMBULATORY_CARE_PROVIDER_SITE_OTHER): Payer: Medicare Other | Admitting: Family Medicine

## 2015-03-20 ENCOUNTER — Encounter: Payer: Self-pay | Admitting: Family Medicine

## 2015-03-20 VITALS — BP 114/78 | HR 94 | Temp 98.3°F | Wt 210.5 lb

## 2015-03-20 DIAGNOSIS — R05 Cough: Secondary | ICD-10-CM | POA: Diagnosis not present

## 2015-03-20 DIAGNOSIS — R059 Cough, unspecified: Secondary | ICD-10-CM

## 2015-03-20 MED ORDER — DOXYCYCLINE HYCLATE 100 MG PO TABS: 100.0000 mg | ORAL_TABLET | Freq: Two times a day (BID) | ORAL | Status: DC

## 2015-03-20 MED ORDER — FUROSEMIDE 40 MG PO TABS: ORAL_TABLET | ORAL | Status: DC

## 2015-03-20 NOTE — Telephone Encounter (Signed)
Spoke with pt and advised her of OTC medications she can take for cough and congestion. Pt wanted to know if there was something that Dr. Meda Coffee could call in for her. Advised pt that she needs to follow up with her PCP if she feels that she needs an antibiotic. Pt coughed while on the phone and noted a deep, barking cough. Advised pt to call PCP after hanging up with me and to get in with them as soon as possible. Pt verbalized understanding and was in agreement with this plan.

## 2015-03-20 NOTE — Progress Notes (Signed)
Pre visit review using our clinic review tool, if applicable. No additional management support is needed unless otherwise documented below in the visit note.  H/o AF and CHF.  In the last few days, had a chest rattle along with cough and wheeze.  No fevers.  Taking lasix QOD.  She doesn't feel poorly.  Is due to restart daily lasix since her weight is up some.  Some BLE edema- trace.  Some sputum.  Usually whitish or clear.  Sleeping on 2 pillows, that is her baseline.  No ST, no ear pain.  Mild rhinorrhea.  On baseline meds, reviewed with patient.    Meds, vitals, and allergies reviewed.   ROS: See HPI.  Otherwise, noncontributory.  GEN: nad, alert and oriented HEENT: mucous membranes moist, tm w/o erythema, nasal exam w/o erythema, clear discharge noted,  OP with cobblestoning NECK: supple w/o LA CV: IRR, not tachy PULM: ctab but coarse BS, no inc wob, no focal dec in BS, cough noted.  EXT: trace BLE edema SKIN: no acute rash

## 2015-03-20 NOTE — Telephone Encounter (Signed)
Follow up ° ° ° ° °Returning a nurses call °

## 2015-03-20 NOTE — Patient Instructions (Signed)
Start doxycycline today and restart daily lasix.  If not better, then let me know.  Schedule a physical when you get a chance.  Take care.  Glad to see you.

## 2015-03-21 DIAGNOSIS — R05 Cough: Secondary | ICD-10-CM | POA: Insufficient documentation

## 2015-03-21 DIAGNOSIS — R059 Cough, unspecified: Secondary | ICD-10-CM | POA: Insufficient documentation

## 2015-03-21 NOTE — Assessment & Plan Note (Addendum)
Would treat given her hx, likely bronchitis.  Start doxy (and restart daily lasix per cards prev instructions, thought she doesn't look floridly fluid overloaded I don't want this to worsen) and fu prn. Routine cautions given.  She agrees.  Nontoxic. Okay for outpatient /fu.

## 2015-06-16 ENCOUNTER — Encounter: Payer: Self-pay | Admitting: Family Medicine

## 2015-06-16 ENCOUNTER — Ambulatory Visit (INDEPENDENT_AMBULATORY_CARE_PROVIDER_SITE_OTHER): Payer: Medicare Other | Admitting: Family Medicine

## 2015-06-16 VITALS — BP 130/76 | HR 60 | Temp 97.8°F | Ht 62.0 in | Wt 205.0 lb

## 2015-06-16 DIAGNOSIS — J209 Acute bronchitis, unspecified: Secondary | ICD-10-CM

## 2015-06-16 MED ORDER — HYDROCOD POLST-CPM POLST ER 10-8 MG/5ML PO SUER: 5.0000 mL | Freq: Two times a day (BID) | ORAL | Status: DC | PRN

## 2015-06-16 MED ORDER — PREDNISONE 50 MG PO TABS: ORAL_TABLET | ORAL | Status: DC

## 2015-06-16 MED ORDER — DOXYCYCLINE HYCLATE 100 MG PO TABS: 100.0000 mg | ORAL_TABLET | Freq: Two times a day (BID) | ORAL | Status: DC

## 2015-06-16 NOTE — Patient Instructions (Signed)
Take the prednisone and use the cough medication as directed.  If you fail to improve, start the doxycycline.  Follow up if you worsen  Take care  Dr. Lacinda Axon

## 2015-06-16 NOTE — Assessment & Plan Note (Signed)
New problem. Treating with prednisone and Tussionex. Discussed likely viral nature with patient and lack of evidence for antibiotics in this case. Patient was given doxycycline if she fails to improve or worsens especially in the setting of comorbidities.

## 2015-06-16 NOTE — Progress Notes (Signed)
Subjective:  Patient ID: Natasha Dickson, female    DOB: 08-01-1945  Age: 70 y.o. MRN: [id number]  CC: Cough, congestion  HPI:  70 year old female with a past medical history of congestive heart failure and chronic kidney disease presents with the above complaint.  Cough, Congestion  Reports cough and congestion for the past week.  SOB is stable. No increasing weight or edema.   Cough is productive.  She's been using Mucinex with no relief.  She feels like she is wheezing as well.  No associated fever, chills.  No known exacerbating factors.  Social Hx   Social History   Social History  . Marital Status: Married    Spouse Name: N/A  . Number of Children: N/A  . Years of Education: N/A   Occupational History  . 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 Smoking status: Never Smoker   . Smokeless tobacco: Never Used  . Alcohol Use: 0.0 oz/week    0 Standard drinks or equivalent per week     Comment: RARE  . Drug Use: No  . Sexual Activity: Not Asked   Other Topics Concern  . None   Social History Hotel manager grad.   Married 40+ years   Scientist, water quality at [employer]  Constitutional: Negative for fever.  HENT: Positive for congestion.   Respiratory: Positive for cough.    Objective:  BP 130/76 mmHg  Pulse 60  Temp(Src) 97.8 F (36.6 C) (Oral)  Ht 5\' 2"  (1.575 m)  Wt 205 lb (92.987 kg)  BMI 37.49 kg/m2  SpO2 98%  BP/Weight 06/16/2015 03/20/2015 [id number]  Systolic BP [id number]  Diastolic BP 76 78 78  Wt. (Lbs) 205 210.5 209  BMI 37.49 38.49 38.22    Physical Exam  Constitutional:  Morbidly obese female in no acute distress.  HENT:  Head: Normocephalic and atraumatic.  Mild oropharyngeal erythema. No x-rays. Normal TMs bilateral. Nose with erythematous turbinates/mucosal edema.  Eyes: Conjunctivae are normal.  Cardiovascular: Normal rate and regular rhythm.   Pulmonary/Chest: Effort normal.    Mild expiratory wheezing.  Neurological: She is alert.  Vitals reviewed.  Lab Results  Component Value Date   WBC 9.6 07/20/2013   HGB 14.5 07/20/2013   HCT 43.8 07/20/2013   PLT 277.0 07/20/2013   GLUCOSE 116* 02/22/2015   CHOL 97 02/16/2013   TRIG 63 02/16/2013   HDL 26* 02/16/2013   LDLDIRECT 137.1 03/29/2011   LDLCALC 58 02/16/2013   ALT 11 02/22/2015   AST 15 02/22/2015   NA 138 02/22/2015   K 4.3 02/22/2015   CL 104 02/22/2015   CREATININE 1.26* 02/22/2015   BUN 21 02/22/2015   CO2 26 02/22/2015   TSH 5.95* 07/20/2013   INR 1.31 02/15/2013   HGBA1C 5.7* 02/16/2013    Assessment & Plan:   Problem List Items Addressed This Visit    Acute bronchitis - Primary    New problem. Treating with prednisone and Tussionex. Discussed likely viral nature with patient and lack of evidence for antibiotics in this case. Patient was given doxycycline if she fails to improve or worsens especially in the setting of comorbidities.         Meds ordered this encounter  Medications  . doxycycline (VIBRA-TABS) 100 MG tablet    Sig: Take 1 tablet (100 mg total) by mouth 2 (two) times daily.    Dispense:  14 tablet    Refill:  0  . predniSONE (DELTASONE) 50 MG tablet    Sig: 1 tablet daily x 5 days.    Dispense:  5 tablet    Refill:  0  . chlorpheniramine-HYDROcodone (TUSSIONEX PENNKINETIC ER) 10-8 MG/5ML SUER    Sig: Take 5 mLs by mouth every 12 (twelve) hours as needed.    Dispense:  115 mL    Refill:  0    Follow-up: PRN  [place]

## 2015-06-16 NOTE — Progress Notes (Signed)
Pre visit review using our clinic review tool, if applicable. No additional management support is needed unless otherwise documented below in the visit note. 

## 2015-07-12 ENCOUNTER — Telehealth: Payer: Self-pay | Admitting: Cardiology

## 2015-07-12 NOTE — Telephone Encounter (Signed)
Patient calling the office for samples of medication:   1.  What medication and dosage are you requesting samples for?Xarelto   2.  Are you currently out of this medication? Yes (5 left )

## 2015-07-13 NOTE — Telephone Encounter (Signed)
Follow up     Patient calling for xarelto samples

## 2015-07-13 NOTE — Telephone Encounter (Signed)
Called patient to let her know that I had put two bottles (14 day supply) at the front desk. She wanted to know if we would have more that we could give her next week. I made her aware that samples are intended for new starts and that we dont always have them available to give to patients established on the medication. She stated that she will call back next week to see.

## 2015-07-19 ENCOUNTER — Other Ambulatory Visit: Payer: Self-pay | Admitting: Cardiology

## 2015-07-20 ENCOUNTER — Telehealth: Payer: Self-pay | Admitting: Cardiology

## 2015-07-20 ENCOUNTER — Other Ambulatory Visit: Payer: Self-pay | Admitting: *Deleted

## 2015-07-20 DIAGNOSIS — I428 Other cardiomyopathies: Secondary | ICD-10-CM

## 2015-07-20 DIAGNOSIS — R0602 Shortness of breath: Secondary | ICD-10-CM

## 2015-07-20 DIAGNOSIS — I482 Chronic atrial fibrillation, unspecified: Secondary | ICD-10-CM

## 2015-07-20 DIAGNOSIS — I5041 Acute combined systolic (congestive) and diastolic (congestive) heart failure: Secondary | ICD-10-CM

## 2015-07-20 MED ORDER — METOPROLOL SUCCINATE ER 100 MG PO TB24: 100.0000 mg | ORAL_TABLET | Freq: Every day | ORAL | Status: DC

## 2015-07-20 NOTE — Telephone Encounter (Signed)
°*  STAT* If patient is at the pharmacy, call can be transferred to refill team.   1. Which medications need to be refilled? (please list name of each medication and dose if known) Metoprolol   2. Which pharmacy/location (including street and city if local pharmacy) is medication to be sent to?[facility] on [address]   3. Do they need a 30 day or 90 day supply? [place]

## 2015-07-20 NOTE — Telephone Encounter (Signed)
Called patient to let her know that her samples are at the front desk for her to pick up

## 2015-07-20 NOTE — Telephone Encounter (Signed)
Patient calling the office for samples of medication: ° ° °1.  What medication and dosage are you requesting samples for? Xarelto °2.  Are you currently out of this medication? Yes ° ° ° °

## 2015-09-04 ENCOUNTER — Encounter: Payer: Self-pay | Admitting: Cardiology

## 2015-09-04 ENCOUNTER — Ambulatory Visit (INDEPENDENT_AMBULATORY_CARE_PROVIDER_SITE_OTHER): Payer: Medicare Other | Admitting: Cardiology

## 2015-09-04 VITALS — BP 124/64 | HR 93 | Ht 62.0 in | Wt 201.0 lb

## 2015-09-04 DIAGNOSIS — Z7901 Long term (current) use of anticoagulants: Secondary | ICD-10-CM

## 2015-09-04 DIAGNOSIS — I4891 Unspecified atrial fibrillation: Secondary | ICD-10-CM | POA: Diagnosis not present

## 2015-09-04 DIAGNOSIS — I5042 Chronic combined systolic (congestive) and diastolic (congestive) heart failure: Secondary | ICD-10-CM | POA: Diagnosis not present

## 2015-09-04 DIAGNOSIS — E785 Hyperlipidemia, unspecified: Secondary | ICD-10-CM

## 2015-09-04 DIAGNOSIS — I10 Essential (primary) hypertension: Secondary | ICD-10-CM

## 2015-09-04 NOTE — Progress Notes (Signed)
Patient ID: BEVERLEY BYNUM, female   DOB: 05/11/1946, 70 y.o.   MRN: [id number]    Patient Name: Natasha Dickson Date of Encounter: 09/04/2015  Primary Care Provider:  Elsie Stain, MD Primary Cardiologist:  Dorothy Spark  Chief complain: SOB, weight gain  Problem List   Past Medical History  Diagnosis Date  . Breast cancer ([place]) 1999    Stage I in left breast  . Closed fracture of unspecified part of neck of femur 2011    Right.  Soreness when standing. (Dr. Durward Fortes)  . Chronic systolic CHF (congestive heart failure) ([place])     a. 02/2013 Echo: EF 30-35%, diff HK, mod dil LA/RA.  Marland Kitchen Atrial fibrillation ([place])     a. 02/2013: s/p TEE and attempted cardioversion x 2-->failed-->rate controlled with bb/digoxin, Xarelto initiated.  . Nonischemic cardiomyopathy ([place])     a. 02/2013 Lexi CL: No ischemia, prob attenuation vs scar, EF 40%-->Med Rx.   Past Surgical History  Procedure Laterality Date  . Mastectomy  1999    Left breast with saline implant.  . Tubal ligation    . Tubal ligation    . Partial hip arthroplasty Right   . Tee without cardioversion N/A 02/18/2013    Procedure: TRANSESOPHAGEAL ECHOCARDIOGRAM (TEE);  Surgeon: Jolaine Artist, MD;  Location: [place];  Service: Cardiovascular;  Laterality: N/A;  . Cardioversion N/A 02/18/2013    Procedure: CARDIOVERSION;  Surgeon: Jolaine Artist, MD;  Location: [facility] ENDOSCOPY;  Service: Cardiovascular;  Laterality: N/A;  . Cardioversion N/A 02/22/2013    Procedure: CARDIOVERSION;  Surgeon: Fay Records, MD;  Location: [facility] ENDOSCOPY;  Service: Cardiovascular;  Laterality: N/A;   Allergies  No Known Allergies  HPI  70 year old female that was Admitted to [facility] in 2014 with combined systolic and diastolic CHF,  and A-fib with RVR. Her original echocardiogram showed LVEF 30-35% with diffuse hypokinesis and her stress test was negative for ischemia, her cardiomyopathy was believed to be secondary to tachycardia  associated with atrial fibrillation. She originally lost significant amount of weight was also diuresed, LVEF improved to normal 50-55% in 2015 but then decreased to 45-50% in 2016. She goes through things with her weight up and down 2030 pounds, she is not very compliant with healthy diet and drinks a lot of soda. Her weight has been as low as 170 pounds and as high as 230 pounds. She was cardioverted twice but went back to atrial fibrillation so currently we are just aiming for rate controlled. Today she is stating that she has been so significant amount of stress as she lost her 9 year old son secondary to complication from pulmonary hypertension. She has unintentionally lost 20 pounds. She denies any chest pain, shortness of breath, lower extremity edema, orthopnea or proximal nocturnal dyspnea denies any palpitations. She is compliant to her meds and has no bleeding from Xarelto. She continues to work at [employer] and has no difficulty with that.  Home Medications  Prior to Admission medications   Medication Sig Start Date End Date Taking? Authorizing Provider  acetaminophen (TYLENOL) 500 MG tablet Take 1,000 mg by mouth every 6 (six) hours as needed for pain.   Yes Historical Provider, MD  digoxin (LANOXIN) 0.25 MG tablet Take 1 tablet (0.25 mg total) by mouth daily. 02/23/13  Yes Rogelia Mire, NP  furosemide (LASIX) 40 MG tablet Take 1.5 tablets (60 mg total) by mouth 2 (two) times daily. 02/23/13  Yes Lisette Grinder  Sharolyn Douglas, NP  lisinopril (PRINIVIL,ZESTRIL) 2.5 MG tablet Take 1 tablet (2.5 mg total) by mouth daily. 02/23/13  Yes Rogelia Mire, NP  metoprolol succinate (TOPROL-XL) 50 MG 24 hr tablet Take 1 tablet (50 mg total) by mouth daily. Take with or immediately following a meal. 02/23/13  Yes Rogelia Mire, NP  neomycin-polymyxin-pramoxine (NEOSPORIN PLUS) 1 % cream Apply 1 application topically as needed (rash).   Yes Historical Provider, MD  potassium chloride SA  (K-DUR,KLOR-CON) 20 MEQ tablet Take 2 tablets (40 mEq total) by mouth 2 (two) times daily. 02/23/13  Yes Rogelia Mire, NP  Rivaroxaban (XARELTO) 20 MG TABS tablet Take 1 tablet (20 mg total) by mouth daily. 02/23/13  Yes Rogelia Mire, NP  spironolactone (ALDACTONE) 25 MG tablet Take 0.5 tablets (12.5 mg total) by mouth daily. 02/23/13  Yes Rogelia Mire, NP   Review of Systems  All other systems reviewed and are otherwise negative except as noted above.  PE:  BP: 124/64, pulse 93, weight 201 pounds.  General: Pleasant, NAD Psych: Normal affect. Neuro: Alert and oriented X 3. Moves all extremities spontaneously. HEENT: Normal  Neck: Supple without bruits or JVD. Lungs:  Resp regular and unlaboredclear to auscultation bilaterally Heart: RRR no s3, s4, or murmurs. Abdomen: Soft, obese, non-tender, non-distended, BS + x 4.  Extremities: No clubbing, cyanosis,no lower extremity edema. DP/PT/Radials 2+ and equal bilaterally.  Accessory Clinical Findings  2D Echocardiogram 9.9.2014  Study Conclusions  - Left ventricle: Diffuse hypokinesis worse in the inferior wall The cavity size was normal. Wall thickness was normal. Systolic function was moderately to severely reduced. The estimated ejection fraction was in the range of 30% to 35%. Diffuse hypokinesis. - Left atrium: The atrium was moderately dilated. - Right atrium: The atrium was moderately dilated. - Atrial septum: No defect or patent foramen ovale was identified.  _____________  Echo 04/21/2013 - Left ventricle: Worse in the septum and apex The cavity size was normal. Wall thickness was increased in a pattern of mild LVH. Systolic function was mildly to moderately reduced. The estimated ejection fraction was in the range of 40% to 45%. Diffuse hypokinesis. - Right atrium: The atrium was mildly dilated.  EKG marked sinus bradycardia 38 beats per minute, ST and T wave abnormality suspicious for possible  inferior and anterolateral ischemia possible digitalis effect.  Echo 08/13/2013 Left ventricle: The cavity size was normal. Systolic function was normal. The estimated ejection fraction was in the range of 50% to 55%. Images were inadequate for LV wall motion assessment.  TTE 07/2014 - Left ventricle: The cavity size was mildly dilated. Wall thickness was normal. Systolic function was mildly reduced. The estimated ejection fraction was in the range of 45% to 50%. - Left atrium: The atrium was severely dilated. - Right atrium: The atrium was moderately dilated. - Atrial septum: No defect or patent foramen ovale was identified.   ASSESSMENT AND PLAN:   1. Chronic combined systolic and diastolic CHF, non-ischemic, tachycardia induced - LVEF now back to normal 30--> 50-55% in march 2015 - 45-50% in 08/2014.the patient appears euvolemic and is asymptomatic we'll continue the same regimen.  2. Afib RVR: S/p TEE and 2 attempted DCCV on 02/18/13 and 02/22/13. Pt was unable to maintain sinus rhythm for longer than few seconds. Her left atrium was noted to be severely dilated at 5.7 cm. She was previously rate controlled on Metoprolol and Digoxin. Anticoagulation with Xarelto. We'll continue the same regimen for rate control, no bleeding  with Xarelto.   3.  Hypertension - controlled, Continue the same regimen   4. Obesity she is encouraged to further try to lose more weight.   Follow up in 6 months, CMP, CBC and lipids prior to the visit.Dorothy Spark, MD 09/04/2015, 2:57 PM

## 2015-09-04 NOTE — Patient Instructions (Signed)
Medication Instructions:  Your physician recommends that you continue on your current medications as directed. Please refer to the Current Medication list given to you today.   Labwork:  Your physician recommends that you return for fasting lab work in: about 6 months. Few days prior to appointment with Dr. Meda Coffee   Testing/Procedures: none  Follow-Up: Your physician wants you to follow-up in: 6 months. You will receive a reminder letter in the mail two months in advance. If you don't receive a letter, please call our office to schedule the follow-up appointment.   Any Other Special Instructions Will Be Listed Below (If Applicable).     If you need a refill on your cardiac medications before your next appointment, please call your pharmacy.

## 2015-09-11 ENCOUNTER — Telehealth: Payer: Self-pay | Admitting: Cardiology

## 2015-09-11 NOTE — Telephone Encounter (Signed)
New message      Patient calling the office for samples of medication:   1.  What medication and dosage are you requesting samples for? xaelto 20mg   2.  Are you currently out of this medication? Have 2 tablets left

## 2015-09-11 NOTE — Telephone Encounter (Signed)
Will route this message to Dr Meda Coffee to review and advise on a different regimen in place of Xarelto for this pt, for pt refused the offer for pt assistance program for this med.  Pt has been on Xarelto since 01/16/15 and depends on samples from our office.  Pt will need to purchase this medication through her pharmacy, per our refill dept or Dr Meda Coffee can advise on a different regimen.  Will follow-up with the pt thereafter.

## 2015-09-11 NOTE — Telephone Encounter (Signed)
Gay Filler, Is there any way to find out if Eliquis would be any cheaper? Thank you, [place] Siren

## 2015-09-11 NOTE — Telephone Encounter (Signed)
Thank you :)

## 2015-09-11 NOTE — Telephone Encounter (Signed)
Spoke with pt.  She states she did not want to get the medication from the pharmacy because she has not met her deductible.  I asked her if she meets it by the end of the year and she did say yes.  So at this time, it is not an issue of affordability it is just a matter of how long it takes her to meet the deductible.  I explained to the patient that the office cannot guarantee when we had samples and so we would not be able to continue to supply her with these each month.  I offered a 30-day free card.  Pt states she already has a prescription at the pharmacy and will pick it up.

## 2015-09-11 NOTE — Telephone Encounter (Signed)
Please advise as patient has solely been relying on samples of xarelto. Per 01/16/15 phone note, she is not interested in patient assistance.

## 2015-09-19 ENCOUNTER — Other Ambulatory Visit: Payer: Self-pay | Admitting: Cardiology

## 2015-09-20 ENCOUNTER — Other Ambulatory Visit: Payer: Self-pay | Admitting: *Deleted

## 2015-09-20 NOTE — Telephone Encounter (Signed)
furosemide (LASIX) 40 MG tablet  Medication   Date: 03/20/2015  Department: [facility]  Ordering/Authorizing: Tonia Ghent, MD      Order Providers    Prescribing Provider Encounter Provider   Tonia Ghent, MD Tonia Ghent, MD    Medication Detail      Disp Refills Start End     furosemide (LASIX) 40 MG tablet   03/20/2015     Sig: Take daily    Class: No Print     Pharmacy    [facility] [place] [address]   Will ask if Dr. Meda Coffee will take over

## 2015-09-22 ENCOUNTER — Telehealth: Payer: Self-pay | Admitting: Cardiology

## 2015-09-22 NOTE — Telephone Encounter (Signed)
Patient had duplicate orders of her Lasix. One order was Lasix 40 mg by mouth daily, and the other was Lasix 60 mg by mouth twice a day. Looking back through patient's chart, patient's most recent change to her Lasix was 10/21/14. Patient is suppose to be taking Lasix 40 mg by mouth daily. Patient stated this is what she has been taking. Updated patient's medication list to reflect the appropriate dose of Lasix. Patient verbalized understanding.

## 2015-09-22 NOTE — Telephone Encounter (Signed)
New message      Pt c/o medication issue:  1. Name of Medication: furosemide 2. How are you currently taking this medication (dosage and times per day)? 40mg  daily  3. Are you having a reaction (difficulty breathing--STAT)? no  4. What is your medication issue? Pt picked up refill presc from pharmacy.  Directions on bottle said take 1.5 tablets bid.  It is a 40mg  tablet.  This is a lot more.  Is this correct?

## 2015-09-25 MED ORDER — FUROSEMIDE 40 MG PO TABS: ORAL_TABLET | ORAL | Status: DC

## 2016-01-06 ENCOUNTER — Other Ambulatory Visit: Payer: Self-pay | Admitting: Cardiology

## 2016-03-01 ENCOUNTER — Encounter: Payer: Self-pay | Admitting: Cardiology

## 2016-03-05 ENCOUNTER — Ambulatory Visit (INDEPENDENT_AMBULATORY_CARE_PROVIDER_SITE_OTHER): Payer: Medicare Other

## 2016-03-05 VITALS — BP 112/76 | HR 95 | Temp 98.0°F | Ht 61.0 in | Wt 215.2 lb

## 2016-03-05 DIAGNOSIS — Z23 Encounter for immunization: Secondary | ICD-10-CM | POA: Diagnosis not present

## 2016-03-05 DIAGNOSIS — Z0000 Encounter for general adult medical examination without abnormal findings: Secondary | ICD-10-CM

## 2016-03-05 NOTE — Progress Notes (Signed)
Subjective:   Natasha Dickson is a 70 y.o. female who presents for an Initial Medicare Annual Wellness Visit.  Review of Systems    N/A  Cardiac Risk Factors include: advanced age (>44men, >49 women);dyslipidemia;obesity (BMI >30kg/m2)     Objective:    Today's Vitals   03/05/16 1547  BP: 112/76  Pulse: 95  Temp: 98 F (36.7 C)  TempSrc: Oral  SpO2: 97%  Weight: 215 lb 4 oz (97.6 kg)  Height: 5\' 1"  (1.549 m)  PainSc: 0-No pain   Body mass index is 40.67 kg/m.   Current Medications (verified) Outpatient Encounter Prescriptions as of 03/05/2016  Medication Sig  . digoxin (LANOXIN) 0.125 MG tablet TAKE ONE-HALF TABLET BY MOUTH ONCE DAILY  . furosemide (LASIX) 40 MG tablet Take daily  . metoprolol succinate (TOPROL XL) 100 MG 24 hr tablet Take 1 tablet (100 mg total) by mouth daily. Take with or immediately following a meal.  . neomycin-polymyxin-pramoxine (NEOSPORIN PLUS) 1 % cream Apply 1 application topically as needed (rash).  . nitroGLYCERIN (NITROSTAT) 0.4 MG SL tablet Place 1 tablet (0.4 mg total) under the tongue every 5 (five) minutes as needed for chest pain.  . potassium chloride (KLOR-CON M10) 10 MEQ tablet Take 1 tablet (10 mEq total) by mouth daily.  Alveda Reasons 20 MG TABS tablet TAKE ONE TABLET BY MOUTH ONCE DAILY   No facility-administered encounter medications on file as of 03/05/2016.     Allergies (verified) Review of patient's allergies indicates no known allergies.   History: Past Medical History:  Diagnosis Date  . Atrial fibrillation ([place])    a. 02/2013: s/p TEE and attempted cardioversion x 2-->failed-->rate controlled with bb/digoxin, Xarelto initiated.  . Breast cancer ([place]) 1999   Stage I in left breast  . Chronic systolic CHF (congestive heart failure) ([facility] [place])    a. 02/2013 Echo: EF 30-35%, diff HK, mod dil LA/RA.  Marland Kitchen Closed fracture of unspecified part of neck of femur 2011   Right.  Soreness when standing. (Dr. Durward Fortes)  . Nonischemic  cardiomyopathy ([place])    a. 02/2013 Lexi CL: No ischemia, prob attenuation vs scar, EF 40%-->Med Rx.   Past Surgical History:  Procedure Laterality Date  . CARDIOVERSION N/A 02/18/2013   Procedure: CARDIOVERSION;  Surgeon: Jolaine Artist, MD;  Location: [facility] ENDOSCOPY;  Service: Cardiovascular;  Laterality: N/A;  . CARDIOVERSION N/A 02/22/2013   Procedure: CARDIOVERSION;  Surgeon: Fay Records, MD;  Location: [place];  Service: Cardiovascular;  Laterality: N/A;  . MASTECTOMY  1999   Left breast with saline implant.  Marland Kitchen PARTIAL HIP ARTHROPLASTY Right   . TEE WITHOUT CARDIOVERSION N/A 02/18/2013   Procedure: TRANSESOPHAGEAL ECHOCARDIOGRAM (TEE);  Surgeon: Jolaine Artist, MD;  Location: [facility] ENDOSCOPY;  Service: Cardiovascular;  Laterality: N/A;  . tubal ligation    . TUBAL LIGATION     Family History  Problem Relation Age of Onset  . Congenital heart disease Son      transposition of great vessels- corrected with  MI  as an adult  . COPD Mother     from long-term smoking  . Colon cancer Father   . Heart disease Father   . Heart attack Father   . Hypertension Father   . Heart disease Brother   . Stroke Neg Hx    Social History   Occupational History  . 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 Smoking status: Never Smoker  . Smokeless tobacco: Never Used  .  Alcohol use 0.0 oz/week     Comment: RARE  . Drug use: No  . Sexual activity: No    Tobacco Counseling Counseling given: No   Activities of Daily Living In your present state of health, do you have any difficulty performing the following activities: 03/05/2016  Hearing? N  Vision? N  Difficulty concentrating or making decisions? N  Walking or climbing stairs? N  Dressing or bathing? N  Doing errands, shopping? N  Preparing Food and eating ? N  Using the Toilet? N  In the past six months, have you accidently leaked urine? N  Do you have problems with loss of bowel control? N  Managing your  Medications? N  Managing your Finances? N  Housekeeping or managing your Housekeeping? N  Some recent data might be hidden    Immunizations and Health Maintenance Immunization History  Administered Date(s) Administered  . Influenza Split 03/26/2011  . Influenza, High Dose Seasonal PF 03/09/2015  . Influenza,inj,Quad PF,36+ Mos 02/23/2013  . Influenza-Unspecified 03/10/2014   There are no preventive care reminders to display for this patient.  Patient Care Team: Tonia Ghent, MD as PCP - General (Family Medicine)  Indicate any recent Medical Services you may have received from other than [facility] providers in the past year (date may be approximate).     Assessment:   This is a routine wellness examination for Natasha Dickson.   Hearing/Vision screen  Hearing Screening   125Hz  250Hz  500Hz  1000Hz  2000Hz  3000Hz  4000Hz  6000Hz  8000Hz   Right ear:   0 0 40  0    Left ear:   0 0 40  0      Visual Acuity Screening   Right eye Left eye Both eyes  Without correction:     With correction: 20/20 20/20 20/20     Dietary issues and exercise activities discussed: Current Exercise Habits: The patient has a physically strenous job, but has no regular exercise apart from work., Exercise limited by: None identified  Goals    . Increase water intake          Starting 03/05/2016, I will attempt to drink at least 8 oz water with each meal.       Depression Screen PHQ 2/9 Scores 03/05/2016  PHQ - 2 Score 0    Fall Risk Fall Risk  03/05/2016  Falls in the past year? Yes  Number falls in past yr: 1  Injury with Fall? Yes  Follow up Falls evaluation completed    Cognitive Function: MMSE - Mini Mental State Exam 03/05/2016  Orientation to time 5  Orientation to Place 5  Registration 3  Attention/ Calculation 0  Recall 3  Language- name 2 objects 0  Language- repeat 1  Language- follow 3 step command 3  Language- read & follow direction 0  Write a sentence 0  Copy design 0  Total score 20     PLEASE NOTE: A Mini-Cog screen was completed. Maximum score is 20. A value of 0 denotes this part of [place] MMSE was not completed or the patient failed this part of the Mini-Cog screening.   Mini-Cog Screening Orientation to Time - Max 5 pts Orientation to Place - Max 5 pts Registration - Max 3 pts Recall - Max 3 pts Language Repeat - Max 1 pts Language Follow 3 Step Command - Max 3 pts  Screening Tests Health Maintenance  Topic Date Due  . MAMMOGRAM  06/09/2016 (Originally 03/28/2013)  . COLONOSCOPY  03/05/2026 (Originally 03/18/1996)  .  ZOSTAVAX  03/05/2026 (Originally 03/18/2006)  . Hepatitis C Screening  03/05/2026 (Originally 1945-09-24)  . PNA vac Low Risk Adult (2 of 2 - PPSV23) 03/05/2017  . TETANUS/TDAP  06/10/2021  . INFLUENZA VACCINE  Completed  . DEXA SCAN  Completed      Plan:     I have personally reviewed and addressed the Medicare Annual Wellness questionnaire and have noted the following in the patient's chart:  A. Medical and social history B. Use of alcohol, tobacco or illicit drugs  C. Current medications and supplements D. Functional ability and status E.  Nutritional status F.  Physical activity G. Advance directives H. List of other physicians I.  Hospitalizations, surgeries, and ER visits in previous 12 months J.  [place] to include hearing, vision, cognitive, depression L. Referrals and appointments - none  In addition, I have reviewed and discussed with patient certain preventive protocols, quality metrics, and best practice recommendations. A written personalized care plan for preventive services as well as general preventive health recommendations were provided to patient.  See attached scanned questionnaire for additional information.   Signed,   Lindell Noe, MHA, BS, LPN Health Advisor

## 2016-03-05 NOTE — Progress Notes (Signed)
Per pt request, both Flu and PCV13 vaccines were administered.

## 2016-03-05 NOTE — Progress Notes (Signed)
PCP notes:   Health maintenance:  Flu vaccine - administered PCV13 - administered Colonoscopy - declined Mammogram - PCP is asked to order after CPE (last mammogram in 2012)  Shingles - declined Hep C screening - declined  Abnormal screenings:   Hearing - failed  Patient concerns:   Pt had a fall in August 2017. Large bruise with indentation present to lower right leg. Pt denies any pain to area. Pt has increased edema to bilateral legs.   Nurse concerns:  None  Next PCP appt:   03/14/16 @ 1415  I reviewed health advisor's note, was available for consultation on the day of service listed in this note, and agree with documentation and plan. Elsie Stain, MD.

## 2016-03-05 NOTE — Patient Instructions (Signed)
Natasha Dickson , Thank you for taking time to come for your Medicare Wellness Visit. I appreciate your ongoing commitment to your health goals. Please review the following plan we discussed and let me know if I can assist you in the future.   These are the goals we discussed: Goals    . Increase water intake          Starting 03/05/2016, I will attempt to drink at least 8 oz water with each meal.        This is a list of the screening recommended for you and due dates:  Health Maintenance  Topic Date Due  . Mammogram  06/09/2016*  . Colon Cancer Screening  03/05/2026*  . Shingles Vaccine  03/05/2026*  .  Hepatitis C: One time screening is recommended by Center for Disease Control  (CDC) for  adults born from [birth year] through 1965.   03/05/2026*  . Pneumonia vaccines (2 of 2 - PPSV23) 03/05/2017  . Tetanus Vaccine  06/10/2021  . Flu Shot  Completed  . DEXA scan (bone density measurement)  Completed  *Topic was postponed. The date shown is not the original due date.   Preventive Care for Adults  A healthy lifestyle and preventive care can promote health and wellness. Preventive health guidelines for adults include the following key practices.  . A routine yearly physical is a good way to check with your health care provider about your health and preventive screening. It is a chance to share any concerns and updates on your health and to receive a thorough exam.  . Visit your dentist for a routine exam and preventive care every 6 months. Brush your teeth twice a day and floss once a day. Good oral hygiene prevents tooth decay and gum disease.  . The frequency of eye exams is based on your age, health, family medical history, use  of contact lenses, and other factors. Follow your health care provider's ecommendations for frequency of eye exams.  . Eat a healthy diet. Foods like vegetables, fruits, whole grains, low-fat dairy products, and lean protein foods contain the nutrients you need  without too many calories. Decrease your intake of foods high in solid fats, added sugars, and salt. Eat the right amount of calories for you. Get information about a proper diet from your health care provider, if necessary.  . Regular physical exercise is one of the most important things you can do for your health. Most adults should get at least 150 minutes of moderate-intensity exercise (any activity that increases your heart rate and causes you to sweat) each week. In addition, most adults need muscle-strengthening exercises on 2 or more days a week.  Silver Sneakers may be a benefit available to you. To determine eligibility, you may visit the website: www.silversneakers.com or contact program at [phone] Mon-Fri between 8AM-8PM.   . Maintain a healthy weight. The body mass index (BMI) is a screening tool to identify possible weight problems. It provides an estimate of body fat based on height and weight. Your health care provider can find your BMI and can help you achieve or maintain a healthy weight.   For adults 20 years and older: ? A BMI below 18.5 is considered underweight. ? A BMI of 18.5 to 24.9 is normal. ? A BMI of 25 to 29.9 is considered overweight. ? A BMI of 30 and above is considered obese.   . Maintain normal blood lipids and cholesterol levels by exercising and minimizing your intake of  saturated fat. Eat a balanced diet with plenty of fruit and vegetables. Blood tests for lipids and cholesterol should begin at age 26 and be repeated every 5 years. If your lipid or cholesterol levels are high, you are over 50, or you are at high risk for heart disease, you [place] need your cholesterol levels checked more frequently. Ongoing high lipid and cholesterol levels should be treated with medicines if diet and exercise are not working.  . If you smoke, find out from your health care provider how to quit. If you do not use tobacco, please do not start.  . If you choose to drink  alcohol, please do not consume more than 2 drinks per day. One drink is considered to be 12 ounces (355 mL) of beer, 5 ounces (148 mL) of wine, or 1.5 ounces (44 mL) of liquor.  . If you are 78-88 years old, ask your health care provider if you should take aspirin to prevent strokes.  . Use sunscreen. Apply sunscreen liberally and repeatedly throughout the day. You should seek shade when your shadow is shorter than you. Protect yourself by wearing long sleeves, pants, a wide-brimmed hat, and sunglasses year round, whenever you are outdoors.  . Once a month, do a whole body skin exam, using a mirror to look at the skin on your back. Tell your health care provider of new moles, moles that have irregular borders, moles that are larger than a pencil eraser, or moles that have changed in shape or color.

## 2016-03-05 NOTE — Progress Notes (Signed)
Pre visit review using our clinic review tool, if applicable. No additional management support is needed unless otherwise documented below in the visit note. 

## 2016-03-11 ENCOUNTER — Other Ambulatory Visit: Payer: Medicare Other | Admitting: *Deleted

## 2016-03-11 DIAGNOSIS — I4891 Unspecified atrial fibrillation: Secondary | ICD-10-CM

## 2016-03-11 DIAGNOSIS — I5042 Chronic combined systolic (congestive) and diastolic (congestive) heart failure: Secondary | ICD-10-CM

## 2016-03-11 DIAGNOSIS — E785 Hyperlipidemia, unspecified: Secondary | ICD-10-CM

## 2016-03-11 LAB — CBC WITH DIFFERENTIAL/PLATELET
Basophils Absolute: 0 cells/uL (ref 0–200)
Basophils Relative: 0 %
Eosinophils Absolute: 308 cells/uL (ref 15–500)
Eosinophils Relative: 4 %
HCT: 45.2 % — ABNORMAL HIGH (ref 35.0–45.0)
Hemoglobin: 15.5 g/dL (ref 11.7–15.5)
Lymphocytes Relative: 27 %
Lymphs Abs: 2079 cells/uL (ref 850–3900)
MCH: 31.9 pg (ref 27.0–33.0)
MCHC: 34.3 g/dL (ref 32.0–36.0)
MCV: 93 fL (ref 80.0–100.0)
MPV: 9.4 fL (ref 7.5–12.5)
Monocytes Absolute: 770 cells/uL (ref 200–950)
Monocytes Relative: 10 %
Neutro Abs: 4543 cells/uL (ref 1500–7800)
Neutrophils Relative %: 59 %
Platelets: 241 10*3/uL (ref 140–400)
RBC: 4.86 MIL/uL (ref 3.80–5.10)
RDW: 13.6 % (ref 11.0–15.0)
WBC: 7.7 10*3/uL (ref 3.8–10.8)

## 2016-03-11 LAB — LIPID PANEL
Cholesterol: 198 mg/dL (ref 125–200)
HDL: 47 mg/dL (ref >=46)
LDL Cholesterol: 125 mg/dL (ref <130)
Total CHOL/HDL Ratio: 4.2 Ratio (ref <=5.0)
Triglycerides: 129 mg/dL (ref <150)
VLDL: 26 mg/dL (ref <30)

## 2016-03-11 LAB — HEPATIC FUNCTION PANEL
ALT: 11 U/L (ref 6–29)
AST: 18 U/L (ref 10–35)
Albumin: 3.8 g/dL (ref 3.6–5.1)
Alkaline Phosphatase: 78 U/L (ref 33–130)
Bilirubin, Direct: 0.1 mg/dL (ref <=0.2)
Indirect Bilirubin: 0.7 mg/dL (ref 0.2–1.2)
Total Bilirubin: 0.8 mg/dL (ref 0.2–1.2)
Total Protein: 6.9 g/dL (ref 6.1–8.1)

## 2016-03-11 LAB — BASIC METABOLIC PANEL
BUN: 14 mg/dL (ref 7–25)
CO2: 24 mmol/L (ref 20–31)
Calcium: 9 mg/dL (ref 8.6–10.4)
Chloride: 105 mmol/L (ref 98–110)
Creat: 0.99 mg/dL (ref 0.50–0.99)
Glucose, Bld: 96 mg/dL (ref 65–99)
Potassium: 4.6 mmol/L (ref 3.5–5.3)
Sodium: 140 mmol/L (ref 135–146)

## 2016-03-14 ENCOUNTER — Encounter: Payer: Self-pay | Admitting: Family Medicine

## 2016-03-14 ENCOUNTER — Ambulatory Visit (INDEPENDENT_AMBULATORY_CARE_PROVIDER_SITE_OTHER): Payer: Medicare Other | Admitting: Family Medicine

## 2016-03-14 VITALS — BP 102/70 | HR 80 | Temp 98.3°F | Ht 61.0 in | Wt 212.5 lb

## 2016-03-14 DIAGNOSIS — I420 Dilated cardiomyopathy: Secondary | ICD-10-CM

## 2016-03-14 DIAGNOSIS — Z0000 Encounter for general adult medical examination without abnormal findings: Secondary | ICD-10-CM

## 2016-03-14 DIAGNOSIS — Y92009 Unspecified place in unspecified non-institutional (private) residence as the place of occurrence of the external cause: Secondary | ICD-10-CM

## 2016-03-14 DIAGNOSIS — L989 Disorder of the skin and subcutaneous tissue, unspecified: Secondary | ICD-10-CM | POA: Diagnosis not present

## 2016-03-14 DIAGNOSIS — W19XXXA Unspecified fall, initial encounter: Secondary | ICD-10-CM | POA: Diagnosis not present

## 2016-03-14 DIAGNOSIS — Y92099 Unspecified place in other non-institutional residence as the place of occurrence of the external cause: Secondary | ICD-10-CM | POA: Diagnosis not present

## 2016-03-14 DIAGNOSIS — Z7189 Other specified counseling: Secondary | ICD-10-CM

## 2016-03-14 MED ORDER — FUROSEMIDE 40 MG PO TABS: 20.0000 mg | ORAL_TABLET | Freq: Every day | ORAL | Status: DC

## 2016-03-14 NOTE — Progress Notes (Signed)
Flu vaccine - administered prev PCV13 - administered prev Colonoscopy - declined, d/w pt.  Mammogram d/w pt.  See AVS.  Shingles - declined Hep C screening - declined DXA d/w pt.  She'll consider and update me.  See AVS.    Abnormal screenings:  Hearing - failed.  Declined hearing aids.   D/w pt.   She has cardiology f/u pending.    Pt had a fall in August 2017 when watering flowers. Large bruise with indentation present to lower right leg at the time. Pt denies any pain to area. Skin still feels tough with persistent hyperpigmentation.    Pt has a little more edema to bilateral legs. She has been cutting her lasix in half.  Labs d/w pt.    Spot on head.  2cm.  Keeps scabbing over, won't heal.  Noted in the last few months by patient.    Her son died earlier this year, he had h/o congential cardiac malformation.  I offered my condolences.    Living will d/w pt.  Would have her daughter Tuesdae Sentman designated if patient were incapacitated.    PMH and SH reviewed  ROS: Per HPI unless specifically indicated in ROS section   Meds, vitals, and allergies reviewed.   GEN: nad, alert and oriented HEENT: mucous membranes moist NECK: supple w/o LA CV: IRR PULM: ctab, no inc wob ABD: soft, +bs EXT: 1+ BLE  edema SKIN: 2cm scab on L side of scalp.  Looks to have a patch of post inflammatory pigmentation on the L shin.  No acute bruising.  Not ttp.

## 2016-03-14 NOTE — Patient Instructions (Addendum)
You can call for a mammogram at: [facility] [place] [phone]  Let me know if you want to go for a bone density test.   Likely reasonable to go back to a full dose of lasix for a few days.   Rosaria Ferries will call about your referral.  Take care.  Glad to see you.

## 2016-03-14 NOTE — Progress Notes (Signed)
Pre visit review using our clinic review tool, if applicable. No additional management support is needed unless otherwise documented below in the visit note. 

## 2016-03-15 DIAGNOSIS — L989 Disorder of the skin and subcutaneous tissue, unspecified: Secondary | ICD-10-CM | POA: Insufficient documentation

## 2016-03-15 DIAGNOSIS — Z7189 Other specified counseling: Secondary | ICD-10-CM | POA: Insufficient documentation

## 2016-03-15 DIAGNOSIS — Y92009 Unspecified place in unspecified non-institutional (private) residence as the place of occurrence of the external cause: Secondary | ICD-10-CM

## 2016-03-15 DIAGNOSIS — W19XXXA Unspecified fall, initial encounter: Secondary | ICD-10-CM | POA: Insufficient documentation

## 2016-03-15 DIAGNOSIS — Z0000 Encounter for general adult medical examination without abnormal findings: Secondary | ICD-10-CM | POA: Insufficient documentation

## 2016-03-15 NOTE — Assessment & Plan Note (Signed)
Discussed with patient about fall cautions. Okay for outpatient follow-up.

## 2016-03-15 NOTE — Assessment & Plan Note (Signed)
It would be reasonable to take a full dose of Lasix for the next few days, before she has her cardiology follow-up. No chest pain. Still okay for outpatient follow-up. I will await cardiology input. She agrees.

## 2016-03-15 NOTE — Assessment & Plan Note (Signed)
Living will d/w pt.  Would have her daughter Peyten Deangelo designated if patient were incapacitated.

## 2016-03-15 NOTE — Assessment & Plan Note (Addendum)
Scalp lesion: Concern is for squamous cell carcinoma. Discussed with patient. Refer to dermatology. Appreciate dermatology input. Leg lesion: This looks to be postinflammatory hyperpigmentation, and should very slowly resolve. Discussed with patient. No intervention needed.  >25 minutes spent in face to face time with patient, >50% spent in counselling or coordination of care.

## 2016-03-15 NOTE — Assessment & Plan Note (Signed)
Flu vaccine - administered prev PCV13 - administered prev Colonoscopy - declined, d/w pt.  Mammogram d/w pt.  See AVS.  Shingles - declined Hep C screening - declined DXA d/w pt.  She'll consider and update me.  See AVS.    Abnormal screenings:  Hearing - failed.  Declined hearing aids.   D/w pt.

## 2016-03-19 ENCOUNTER — Ambulatory Visit (INDEPENDENT_AMBULATORY_CARE_PROVIDER_SITE_OTHER): Payer: Medicare Other | Admitting: Cardiology

## 2016-03-19 ENCOUNTER — Encounter: Payer: Self-pay | Admitting: Cardiology

## 2016-03-19 VITALS — BP 124/80 | HR 124 | Ht 61.0 in | Wt 208.0 lb

## 2016-03-19 DIAGNOSIS — Z7901 Long term (current) use of anticoagulants: Secondary | ICD-10-CM | POA: Diagnosis not present

## 2016-03-19 DIAGNOSIS — I5042 Chronic combined systolic (congestive) and diastolic (congestive) heart failure: Secondary | ICD-10-CM | POA: Diagnosis not present

## 2016-03-19 DIAGNOSIS — E782 Mixed hyperlipidemia: Secondary | ICD-10-CM | POA: Diagnosis not present

## 2016-03-19 DIAGNOSIS — I482 Chronic atrial fibrillation, unspecified: Secondary | ICD-10-CM

## 2016-03-19 DIAGNOSIS — I10 Essential (primary) hypertension: Secondary | ICD-10-CM

## 2016-03-19 DIAGNOSIS — I4891 Unspecified atrial fibrillation: Secondary | ICD-10-CM

## 2016-03-19 MED ORDER — DILTIAZEM HCL ER COATED BEADS 120 MG PO CP24: 120.0000 mg | ORAL_CAPSULE | ORAL | 3 refills | Status: DC

## 2016-03-19 MED ORDER — FUROSEMIDE 40 MG PO TABS: 40.0000 mg | ORAL_TABLET | ORAL | 3 refills | Status: DC

## 2016-03-19 NOTE — Patient Instructions (Signed)
Medication Instructions:   STOP TAKING DIGOXIN NOW   START TAKING CARDIZEM CD (DILTIAZEM) 120 MG BY MOUTH EVERY MORNING   INCREASE YOUR LASIX TO 40 MG BY MOUTH EVERY MORNING    Follow-Up:  2 MONTHS WITH DR Meda Coffee    If you need a refill on your cardiac medications before your next appointment, please call your pharmacy.

## 2016-03-19 NOTE — Progress Notes (Signed)
Patient ID: PERMELIA AVENA, female   DOB: 1945-09-16, 70 y.o.   MRN: [id number]    Patient Name: Natasha Dickson Date of Encounter: 03/19/2016  Primary Care Provider:  Elsie Stain, MD Primary Cardiologist:  Ena Dawley  Chief complain: SOB, weight gain  Problem List   Past Medical History:  Diagnosis Date  . Atrial fibrillation ([place])    a. 02/2013: s/p TEE and attempted cardioversion x 2-->failed-->rate controlled with bb/digoxin, Xarelto initiated.  . Breast cancer ([place]) 1999   Stage I in left breast  . Chronic systolic CHF (congestive heart failure) ([place])    a. 02/2013 Echo: EF 30-35%, diff HK, mod dil LA/RA.  Marland Kitchen Closed fracture of unspecified part of neck of femur 2011   Right.  Soreness when standing. (Dr. Durward Fortes)  . Nonischemic cardiomyopathy ([place])    a. 02/2013 Lexi CL: No ischemia, prob attenuation vs scar, EF 40%-->Med Rx.   Past Surgical History:  Procedure Laterality Date  . CARDIOVERSION N/A 02/18/2013   Procedure: CARDIOVERSION;  Surgeon: Jolaine Artist, MD;  Location: [facility] ENDOSCOPY;  Service: Cardiovascular;  Laterality: N/A;  . CARDIOVERSION N/A 02/22/2013   Procedure: CARDIOVERSION;  Surgeon: Fay Records, MD;  Location: [place];  Service: Cardiovascular;  Laterality: N/A;  . MASTECTOMY  1999   Left breast with saline implant.  Marland Kitchen PARTIAL HIP ARTHROPLASTY Right   . TEE WITHOUT CARDIOVERSION N/A 02/18/2013   Procedure: TRANSESOPHAGEAL ECHOCARDIOGRAM (TEE);  Surgeon: Jolaine Artist, MD;  Location: [facility] ENDOSCOPY;  Service: Cardiovascular;  Laterality: N/A;  . tubal ligation    . TUBAL LIGATION     Allergies  No Known Allergies  HPI  70 year old female that was Ad[facility] to [facility] in 2014 with combined systolic and diastolic CHF,  and A-fib with RVR. Her original echocardiogram showed LVEF 30-35% with diffuse hypokinesis and her stress test was negative for ischemia, her cardiomyopathy was believed to be secondary to tachycardia associated  with atrial fibrillation. She originally lost significant amount of weight was also diuresed, LVEF improved to normal 50-55% in 2015 but then decreased to 45-50% in 2016. She goes through things with her weight up and down 2030 pounds, she is not very compliant with healthy diet and drinks a lot of soda. Her weight has been as low as 170 pounds and as high as 230 pounds. She was cardioverted twice but went back to atrial fibrillation so currently we are just aiming for rate controlled. Today she is stating that she has been so significant amount of stress as she lost her 59 year old son secondary to complication from pulmonary hypertension. She has unintentionally lost 20 pounds. She denies any chest pain, shortness of breath, lower extremity edema, orthopnea or proximal nocturnal dyspnea denies any palpitations. She is compliant to her meds and has no bleeding from Xarelto. She continues to work at [employer] and has no difficulty with that.  03/19/2016 - 6 months follow-up, the patient states that she has noticed some mild worsening lower extremity edema, she denies worsening shortness of breath and no chest pain. She continues to work at [employer] for 4 hours today. She has had no problems finishing the shift. She has been compliant to her medicines and denies any palpitations or syncope. She has lost 10 pounds recently.  Home Medications  Prior to Admission medications   Medication Sig Start Date End Date Taking? Authorizing Provider  acetaminophen (TYLENOL) 500 MG tablet Take 1,000 mg by mouth every  6 (six) hours as needed for pain.   Yes Historical Provider, MD  digoxin (LANOXIN) 0.25 MG tablet Take 1 tablet (0.25 mg total) by mouth daily. 02/23/13  Yes Rogelia Mire, NP  furosemide (LASIX) 40 MG tablet Take 1.5 tablets (60 mg total) by mouth 2 (two) times daily. 02/23/13  Yes Rogelia Mire, NP  lisinopril (PRINIVIL,ZESTRIL) 2.5 MG tablet Take 1 tablet (2.5 mg total) by  mouth daily. 02/23/13  Yes Rogelia Mire, NP  metoprolol succinate (TOPROL-XL) 50 MG 24 hr tablet Take 1 tablet (50 mg total) by mouth daily. Take with or immediately following a meal. 02/23/13  Yes Rogelia Mire, NP  neomycin-polymyxin-pramoxine (NEOSPORIN PLUS) 1 % cream Apply 1 application topically as needed ([place]).   Yes Historical Provider, MD  potassium chloride SA (K-DUR,KLOR-CON) 20 MEQ tablet Take 2 tablets (40 mEq total) by mouth 2 (two) times daily. 02/23/13  Yes Rogelia Mire, NP  Rivaroxaban (XARELTO) 20 MG TABS tablet Take 1 tablet (20 mg total) by mouth daily. 02/23/13  Yes Rogelia Mire, NP  spironolactone (ALDACTONE) 25 MG tablet Take 0.5 tablets (12.5 mg total) by mouth daily. 02/23/13  Yes Rogelia Mire, NP   Review of Systems  All other systems reviewed and are otherwise negative except as noted above.  PE:  BP: 124/64, pulse 93, weight 201 pounds.  General: Pleasant, NAD Psych: Normal affect. Neuro: Alert and oriented X 3. Moves all extremities spontaneously. HEENT: Normal  Neck: Supple without bruits or JVD. Lungs:  Resp regular and unlaboredclear to auscultation bilaterally Heart: RRR no s3, s4, or murmurs. Abdomen: Soft, obese, non-tender, non-distended, BS + x 4.  Extremities: No clubbing, cyanosis,no lower extremity edema. DP/PT/Radials 2+ and equal bilaterally.  Accessory Clinical Findings  2D Echocardiogram 9.9.2014  Study Conclusions  - Left ventricle: Diffuse hypokinesis worse in the inferior wall The cavity size was normal. Wall thickness was normal. Systolic function was moderately to severely reduced. The estimated ejection fraction was in the range of 30% to 35%. Diffuse hypokinesis. - Left atrium: The atrium was moderately dilated. - Right atrium: The atrium was moderately dilated. - Atrial septum: No defect or patent foramen ovale was identified.  _____________  Echo 04/21/2013 - Left ventricle: Worse in the  septum and apex The cavity size was normal. Wall thickness was increased in a pattern of mild LVH. Systolic function was mildly to moderately reduced. The estimated ejection fraction was in the range of 40% to 45%. Diffuse hypokinesis. - Right atrium: The atrium was mildly dilated.  EKG marked sinus bradycardia 38 beats per minute, ST and T wave abnormality suspicious for possible inferior and anterolateral ischemia possible digitalis effect.  Echo 08/13/2013 Left ventricle: The cavity size was normal. Systolic function was normal. The estimated ejection fraction was in the range of 50% to 55%. Images were inadequate for LV wall motion assessment.  TTE 07/2014 - Left ventricle: The cavity size was mildly dilated. Wall thickness was normal. Systolic function was mildly reduced. The estimated ejection fraction was in the range of 45% to 50%. - Left atrium: The atrium was severely dilated. - Right atrium: The atrium was moderately dilated. - Atrial septum: No defect or patent foramen ovale was identified.   ASSESSMENT AND PLAN:   1. Acute on chronic combined systolic and diastolic CHF, non-ischemic, tachycardia induced - LVEF now back to normal 30--> 50-55% in march 2015 - 45-50% in 08/2014. The patient is mildly fluid overloaded on physical exam will increase  Lasix from 20-40 mg daily and follow-up in one month's.  2. Afib RVR: S/p TEE and 2 attempted DCCV on 02/18/13 and 02/22/13. Pt was unable to maintain sinus rhythm for longer than few seconds. Her left atrium was noted to be severely dilated at 5.7 cm. She was previously rate controlled on Metoprolol and Digoxin. Anticoagulation with Xarelto. Her heart rate is elevated at 1 20 bpm, will discontinue digoxin and start Cardizem CD 120 mg daily to be taken in the morning as she takes her Toprol-XL at night.  3.  Hypertension - controlled, change regimen as above  4. Obesity she is encouraged to further try to lose more weight.    Follow up in 2 months, CMP at that time.  Ena Dawley, MD 03/19/2016, 3:12 PM

## 2016-04-03 DIAGNOSIS — C4441 Basal cell carcinoma of skin of scalp and neck: Secondary | ICD-10-CM | POA: Diagnosis not present

## 2016-04-04 DIAGNOSIS — C4441 Basal cell carcinoma of skin of scalp and neck: Secondary | ICD-10-CM | POA: Diagnosis not present

## 2016-04-16 ENCOUNTER — Other Ambulatory Visit: Payer: Self-pay | Admitting: Cardiology

## 2016-04-17 DIAGNOSIS — Z1231 Encounter for screening mammogram for malignant neoplasm of breast: Secondary | ICD-10-CM | POA: Diagnosis not present

## 2016-04-18 ENCOUNTER — Encounter: Payer: Self-pay | Admitting: Family Medicine

## 2016-05-22 ENCOUNTER — Encounter: Payer: Self-pay | Admitting: Cardiology

## 2016-05-27 ENCOUNTER — Ambulatory Visit: Payer: Medicare Other | Admitting: Cardiology

## 2016-05-28 ENCOUNTER — Ambulatory Visit (INDEPENDENT_AMBULATORY_CARE_PROVIDER_SITE_OTHER): Payer: Medicare Other | Admitting: Cardiology

## 2016-05-28 VITALS — BP 110/70 | HR 80 | Ht 61.0 in | Wt 210.0 lb

## 2016-05-28 DIAGNOSIS — I428 Other cardiomyopathies: Secondary | ICD-10-CM | POA: Diagnosis not present

## 2016-05-28 DIAGNOSIS — I10 Essential (primary) hypertension: Secondary | ICD-10-CM

## 2016-05-28 DIAGNOSIS — I5041 Acute combined systolic (congestive) and diastolic (congestive) heart failure: Secondary | ICD-10-CM | POA: Diagnosis not present

## 2016-05-28 DIAGNOSIS — E782 Mixed hyperlipidemia: Secondary | ICD-10-CM | POA: Diagnosis not present

## 2016-05-28 DIAGNOSIS — I5042 Chronic combined systolic (congestive) and diastolic (congestive) heart failure: Secondary | ICD-10-CM

## 2016-05-28 NOTE — Patient Instructions (Signed)
Medication Instructions:   Your physician recommends that you continue on your current medications as directed. Please refer to the Current Medication list given to you today.    Follow-Up:  4 MONTHS WITH DR NELSON       If you need a refill on your cardiac medications before your next appointment, please call your pharmacy.   

## 2016-05-28 NOTE — Progress Notes (Signed)
Patient ID: Natasha Dickson, female   DOB: 1946/02/20, 70 y.o.   MRN: [id number]    Patient Name: HOLLAN Dickson Date of Encounter: 05/28/2016  Primary Care Provider:  Elsie Stain, MD Primary Cardiologist:  Ena Dawley  Chief complain: SOB, weight gain  Problem List   Past Medical History:  Diagnosis Date  . Atrial fibrillation ([place])    a. 02/2013: s/p TEE and attempted cardioversion x 2-->failed-->rate controlled with bb/digoxin, Xarelto initiated.  . Breast cancer ([place]) 1999   Stage I in left breast  . Chronic systolic CHF (congestive heart failure) ([place])    a. 02/2013 Echo: EF 30-35%, diff HK, mod dil LA/RA.  Marland Kitchen Closed fracture of unspecified part of neck of femur 2011   Right.  Soreness when standing. (Dr. Durward Fortes)  . Nonischemic cardiomyopathy ([place])    a. 02/2013 Lexi CL: No ischemia, prob attenuation vs scar, EF 40%-->Med Rx.   Past Surgical History:  Procedure Laterality Date  . CARDIOVERSION N/A 02/18/2013   Procedure: CARDIOVERSION;  Surgeon: Jolaine Artist, MD;  Location: [facility] ENDOSCOPY;  Service: Cardiovascular;  Laterality: N/A;  . CARDIOVERSION N/A 02/22/2013   Procedure: CARDIOVERSION;  Surgeon: Fay Records, MD;  Location: [place];  Service: Cardiovascular;  Laterality: N/A;  . MASTECTOMY  1999   Left breast with saline implant.  Marland Kitchen PARTIAL HIP ARTHROPLASTY Right   . TEE WITHOUT CARDIOVERSION N/A 02/18/2013   Procedure: TRANSESOPHAGEAL ECHOCARDIOGRAM (TEE);  Surgeon: Jolaine Artist, MD;  Location: [facility] ENDOSCOPY;  Service: Cardiovascular;  Laterality: N/A;  . tubal ligation    . TUBAL LIGATION     Allergies  No Known Allergies  HPI  70 year old female that was Admitted to [facility] in 2014 with combined systolic and diastolic CHF,  and A-fib with RVR. Her original echocardiogram showed LVEF 30-35% with diffuse hypokinesis and her stress test was negative for ischemia, her cardiomyopathy was believed to be secondary to tachycardia associated  with atrial fibrillation. She originally lost significant amount of weight was also diuresed, LVEF improved to normal 50-55% in 2015 but then decreased to 45-50% in 2016. She goes through things with her weight up and down 2030 pounds, she is not very compliant with healthy diet and drinks a lot of soda. Her weight has been as low as 170 pounds and as high as 230 pounds. She was cardioverted twice but went back to atrial fibrillation so currently we are just aiming for rate controlled. Today she is stating that she has been so significant amount of stress as she lost her 45 year old son secondary to complication from pulmonary hypertension. She has unintentionally lost 20 pounds. She denies any chest pain, shortness of breath, lower extremity edema, orthopnea or proximal nocturnal dyspnea denies any palpitations. She is compliant to her meds and has no bleeding from Xarelto. She continues to work at [employer] and has no difficulty with that.  03/19/2016 - 6 months follow-up, the patient states that she has noticed some mild worsening lower extremity edema, she denies worsening shortness of breath and no chest pain. She continues to work at [employer] for 4 hours today. She has had no problems finishing the shift. She has been compliant to her medicines and denies any palpitations or syncope. She has lost 10 pounds recently.  05/28/16 - 2 months follow up, the patient states that she continues to have LE edema, she is taking her daily lasix with dinner and doesn't like to go to  the bathroom all night.  She denies orthopnea or PND. No CP, no palpitations or syncope.   Home Medications  Prior to Admission medications   Medication Sig Start Date End Date Taking? Authorizing Provider  acetaminophen (TYLENOL) 500 MG tablet Take 1,000 mg by mouth every 6 (six) hours as needed for pain.   Yes Historical Provider, MD  digoxin (LANOXIN) 0.25 MG tablet Take 1 tablet (0.25 mg total) by mouth daily.  02/23/13  Yes Rogelia Mire, NP  furosemide (LASIX) 40 MG tablet Take 1.5 tablets (60 mg total) by mouth 2 (two) times daily. 02/23/13  Yes Rogelia Mire, NP  lisinopril (PRINIVIL,ZESTRIL) 2.5 MG tablet Take 1 tablet (2.5 mg total) by mouth daily. 02/23/13  Yes Rogelia Mire, NP  metoprolol succinate (TOPROL-XL) 50 MG 24 hr tablet Take 1 tablet (50 mg total) by mouth daily. Take with or immediately following a meal. 02/23/13  Yes Rogelia Mire, NP  neomycin-polymyxin-pramoxine (NEOSPORIN PLUS) 1 % cream Apply 1 application topically as needed (rash).   Yes Historical Provider, MD  potassium chloride SA (K-DUR,KLOR-CON) 20 MEQ tablet Take 2 tablets (40 mEq total) by mouth 2 (two) times daily. 02/23/13  Yes Rogelia Mire, NP  Rivaroxaban (XARELTO) 20 MG TABS tablet Take 1 tablet (20 mg total) by mouth daily. 02/23/13  Yes Rogelia Mire, NP  spironolactone (ALDACTONE) 25 MG tablet Take 0.5 tablets (12.5 mg total) by mouth daily. 02/23/13  Yes Rogelia Mire, NP   Review of Systems  All other systems reviewed and are otherwise negative except as noted above.  PE:  BP: 124/64, pulse 93, weight 201 pounds.  General: Pleasant, NAD Psych: Normal affect. Neuro: Alert and oriented X 3. Moves all extremities spontaneously. HEENT: Normal  Neck: Supple without bruits or JVD. Lungs:  Resp regular and unlaboredclear to auscultation bilaterally Heart: RRR no s3, s4, or murmurs. Abdomen: Soft, obese, non-tender, non-distended, BS + x 4.  Extremities: No clubbing, cyanosis, B/L lower extremity edema up to mid calves. DP/PT/Radials 2+ and equal bilaterally.  Accessory Clinical Findings  2D Echocardiogram 9.9.2014  Study Conclusions  - Left ventricle: Diffuse hypokinesis worse in the inferior wall The cavity size was normal. Wall thickness was normal. Systolic function was moderately to severely reduced. The estimated ejection fraction was in the range of 30% to 35%.  Diffuse hypokinesis. - Left atrium: The atrium was moderately dilated. - Right atrium: The atrium was moderately dilated. - Atrial septum: No defect or patent foramen ovale was identified.  _____________  Echo 04/21/2013 - Left ventricle: Worse in the septum and apex The cavity size was normal. Wall thickness was increased in a pattern of mild LVH. Systolic function was mildly to moderately reduced. The estimated ejection fraction was in the range of 40% to 45%. Diffuse hypokinesis. - Right atrium: The atrium was mildly dilated.  EKG marked sinus bradycardia 38 beats per minute, ST and T wave abnormality suspicious for possible inferior and anterolateral ischemia possible digitalis effect.  Echo 08/13/2013 Left ventricle: The cavity size was normal. Systolic function was normal. The estimated ejection fraction was in the range of 50% to 55%. Images were inadequate for LV wall motion assessment.  TTE 07/2014 - Left ventricle: The cavity size was mildly dilated. Wall thickness was normal. Systolic function was mildly reduced. The estimated ejection fraction was in the range of 45% to 50%. - Left atrium: The atrium was severely dilated. - Right atrium: The atrium was moderately dilated. - Atrial septum:  No defect or patent foramen ovale was identified.   ASSESSMENT AND PLAN:   1. Acute on chronic combined systolic and diastolic CHF, non-ischemic, tachycardia induced - LVEF now back to normal 30--> 50-55% in march 2015 - 45-50% in 08/2014. The patient continues to be mildly fluid overloaded - no lung rales, but LE edema, she is advised to take lasix 40 mg po daily in the morning and use compression stockings.  2. Afib RVR: S/p TEE and 2 attempted DCCV on 02/18/13 and 02/22/13. Pt was unable to maintain sinus rhythm for longer than few seconds. Her left atrium was noted to be severely dilated at 5.7 cm. Now rate controlled on Toprol XL and Diltiazem CD 120 mg po daily.  3.   Hypertension - controlled, change regimen as above  4. Obesity she is encouraged to further try to lose more weight.   Follow up in 4 months.  Ena Dawley, MD 05/28/2016, 2:52 PM

## 2016-06-24 DIAGNOSIS — Z85828 Personal history of other malignant neoplasm of skin: Secondary | ICD-10-CM | POA: Diagnosis not present

## 2016-06-24 DIAGNOSIS — C4441 Basal cell carcinoma of skin of scalp and neck: Secondary | ICD-10-CM | POA: Diagnosis not present

## 2016-07-15 DIAGNOSIS — Z48817 Encounter for surgical aftercare following surgery on the skin and subcutaneous tissue: Secondary | ICD-10-CM | POA: Diagnosis not present

## 2016-07-15 DIAGNOSIS — Z08 Encounter for follow-up examination after completed treatment for malignant neoplasm: Secondary | ICD-10-CM | POA: Diagnosis not present

## 2016-07-22 DIAGNOSIS — S0100XD Unspecified open wound of scalp, subsequent encounter: Secondary | ICD-10-CM | POA: Diagnosis not present

## 2016-08-05 DIAGNOSIS — S0100XD Unspecified open wound of scalp, subsequent encounter: Secondary | ICD-10-CM | POA: Diagnosis not present

## 2016-09-09 ENCOUNTER — Ambulatory Visit (INDEPENDENT_AMBULATORY_CARE_PROVIDER_SITE_OTHER): Payer: Medicare Other | Admitting: Cardiology

## 2016-09-09 ENCOUNTER — Encounter: Payer: Self-pay | Admitting: Cardiology

## 2016-09-09 ENCOUNTER — Other Ambulatory Visit: Payer: Self-pay | Admitting: *Deleted

## 2016-09-09 VITALS — BP 116/66 | HR 80 | Ht 61.0 in | Wt 214.0 lb

## 2016-09-09 DIAGNOSIS — E7849 Other hyperlipidemia: Secondary | ICD-10-CM

## 2016-09-09 DIAGNOSIS — Z7901 Long term (current) use of anticoagulants: Secondary | ICD-10-CM

## 2016-09-09 DIAGNOSIS — I5041 Acute combined systolic (congestive) and diastolic (congestive) heart failure: Secondary | ICD-10-CM | POA: Diagnosis not present

## 2016-09-09 DIAGNOSIS — I482 Chronic atrial fibrillation, unspecified: Secondary | ICD-10-CM

## 2016-09-09 DIAGNOSIS — E784 Other hyperlipidemia: Secondary | ICD-10-CM

## 2016-09-09 DIAGNOSIS — I5042 Chronic combined systolic (congestive) and diastolic (congestive) heart failure: Secondary | ICD-10-CM

## 2016-09-09 DIAGNOSIS — I10 Essential (primary) hypertension: Secondary | ICD-10-CM

## 2016-09-09 DIAGNOSIS — I4891 Unspecified atrial fibrillation: Secondary | ICD-10-CM | POA: Diagnosis not present

## 2016-09-09 MED ORDER — RIVAROXABAN 20 MG PO TABS: 20.0000 mg | ORAL_TABLET | Freq: Every day | ORAL | 11 refills | Status: DC

## 2016-09-09 MED ORDER — NITROGLYCERIN 0.4 MG SL SUBL: 0.4000 mg | SUBLINGUAL_TABLET | SUBLINGUAL | 3 refills | Status: DC | PRN

## 2016-09-09 NOTE — Telephone Encounter (Signed)
Missing information from previous refill note, pt has been relying solely on samples since 2016 and has refused PA due to asking for proof of income, pt will have to pick up RX from pharmacy as we can not supply her with samples.

## 2016-09-09 NOTE — Patient Instructions (Signed)
Medication Instructions:   Your physician recommends that you continue on your current medications as directed. Please refer to the Current Medication list given to you today.    Labwork:  TODAY---CMET, CBC W DIFF, TSH, AND PRO-BNP    Follow-Up:   3 MONTHS WITH DR Meda Coffee       If you need a refill on your cardiac medications before your next appointment, please call your pharmacy.

## 2016-09-09 NOTE — Telephone Encounter (Signed)
Telephone   09/11/2015 [facility]  Dorothy Spark, MD  Cardiology   Conversation  (Newest Message First)        09/11/15 1:12 PM  Dorothy Spark, MD routed this conversation to Aris Georgia, [facility]  Dorothy Spark, MD      09/11/15 1:11 PM  Note    Thank you!          09/11/15 12:09 PM  Aris [place], [place] routed this conversation to Nuala Alpha, LPN . Dorothy Spark, MD . Juventino Slovak, [place], [facility]      09/11/15 12:04 PM  Note    Spoke with pt.  She states she did not want to get the medication from the pharmacy because she has not met her deductible.  I asked her if she meets it by the end of the year and she did say yes.  So at this time, it is not an issue of affordability it is just a matter of how long it takes her to meet the deductible.  I explained to the patient that the office cannot guarantee when we had samples and so we would not be able to continue to supply her with these each month.  I offered a 30-day free card.  Pt states she already has a prescription at the pharmacy and will pick it up.           09/11/15 11:00 AM  Dorothy Spark, MD routed this conversation to Aris Georgia, [facility] . Nuala Alpha, LPN  Dorothy Spark, MD      09/11/15 11:00 AM  Note    Gay Filler, Is there any way to find out if Eliquis would be any cheaper? Thank you, Candelaria Celeste, LPN  to Dorothy Spark, MD       09/11/15 10:57 AM  Review and advise--pt on xarelto and depends on samples as her prescription for this med--pt refused assistance program offered  Nuala Alpha, LPN      01/17/15 94:50 AM  Note    Will route this message to Dr Meda Coffee to review and advise on a different regimen in place of Xarelto for this pt, for pt refused the offer for pt assistance program for this med.  Pt has been on Xarelto since 01/16/15 and depends on samples from our office.  Pt will need to purchase this medication through her  pharmacy, per our refill dept or Dr Meda Coffee can advise on a different regimen.  Will follow-up with the pt thereafter.           09/11/15 10:44 AM  Mindy Braxton Feathers, CMA routed this conversation to Nuala Alpha, LPN  Juventino Slovak, [facility]      09/11/15 10:43 AM  Note    Please advise as patient has solely been relying on samples of xarelto. Per 01/16/15 phone note, she is not interested in patient assistance.            09/11/15 9:55 AM  Earnestine Mealing routed this conversation to [facility] Refill  Earnestine Mealing      09/11/15 9:54 AM  Note    New message      Patient calling the office for samples of medication:   1. What medication and dosage are you requesting samples for? xaelto 46m  2. Are you currentlyout of this medication? Have 2  tablets left            09/11/15 9:54 AM    Arbie Cookey C. Owens Shark contacted Earnestine Mealing  Additional Documentation   Encounter Info:   Billing Info,   History,   Allergies,   Detailed Report

## 2016-09-09 NOTE — Telephone Encounter (Signed)
          09/11/15 10:44 AM  Mindy Braxton Feathers, CMA routed this conversation to Nuala Alpha, LPN  Juventino Slovak, [facility]      09/11/15 10:43 AM  Note    Please advise as patient has solely been relying on samples of xarelto. Per 01/16/15 phone note, she is not interested in patient assistance.            09/11/15 9:55 AM  Earnestine Mealing routed this conversation to [facility] Refill  Earnestine Mealing      09/11/15 9:54 AM  Note    New message      Patient calling the office for samples of medication:   1. What medication and dosage are you requesting samples for? xaelto 20mg   2. Are you currentlyout of this medication? Have 2 tablets left

## 2016-09-09 NOTE — Progress Notes (Signed)
Patient ID: [name], female   DOB: [birth year], 71 y.o.   MRN: [id number]    Patient Name: Natasha Dickson Date of Encounter: 09/09/2016  Primary Care Provider:  Elsie Stain, MD Primary Cardiologist:  Natasha Dickson  Chief complain: Follow-up for acute on chronic combined diastolic and systolic CHF   Problem List   Past Medical History:  Diagnosis Date  . Atrial fibrillation ([place])    a. 02/2013: s/p TEE and attempted cardioversion x 2-->failed-->rate controlled with bb/digoxin, Xarelto initiated.  . Breast cancer ([place]) 1999   Stage I in left breast  . Chronic systolic CHF (congestive heart failure) ([place])    a. 02/2013 Echo: EF 30-35%, diff HK, mod dil LA/RA.  Marland Kitchen Closed fracture of unspecified part of neck of femur 2011   Right.  Soreness when standing. (Dr. Durward Dickson)  . Nonischemic cardiomyopathy ([place])    a. 02/2013 Lexi CL: No ischemia, prob attenuation vs scar, EF 40%-->Med Rx.   Past Surgical History:  Procedure Laterality Date  . CARDIOVERSION N/A 02/18/2013   Procedure: CARDIOVERSION;  Surgeon: Natasha Artist, MD;  Location: [facility] ENDOSCOPY;  Service: Cardiovascular;  Laterality: N/A;  . CARDIOVERSION N/A 02/22/2013   Procedure: CARDIOVERSION;  Surgeon: Natasha Records, MD;  Location: [place];  Service: Cardiovascular;  Laterality: N/A;  . MASTECTOMY  1999   Left breast with saline implant.  Marland Kitchen PARTIAL HIP ARTHROPLASTY Right   . TEE WITHOUT CARDIOVERSION N/A 02/18/2013   Procedure: TRANSESOPHAGEAL ECHOCARDIOGRAM (TEE);  Surgeon: Natasha Artist, MD;  Location: [facility] ENDOSCOPY;  Service: Cardiovascular;  Laterality: N/A;  . tubal ligation    . TUBAL LIGATION     Allergies  No Known Allergies  HPI  71 year old female that was Admitted to [facility] in 2014 with combined systolic and diastolic CHF,  and A-fib with RVR. Her original echocardiogram showed LVEF 30-35% with diffuse hypokinesis and her stress test was negative for ischemia, her cardiomyopathy was  believed to be secondary to tachycardia associated with atrial fibrillation. She originally lost significant amount of weight was also diuresed, LVEF improved to normal 50-55% in 2015 but then decreased to 45-50% in 2016. She goes through things with her weight up and down 2030 pounds, she is not very compliant with healthy diet and drinks a lot of soda. Her weight has been as low as 170 pounds and as high as 230 pounds. She was cardioverted twice but went back to atrial fibrillation so currently we are just aiming for rate controlled. Today she is stating that she has been so significant amount of stress as she lost her 80 year old son secondary to complication from pulmonary hypertension. She has unintentionally lost 20 pounds. She denies any chest pain, shortness of breath, lower extremity edema, orthopnea or proximal nocturnal dyspnea denies any palpitations. She is compliant to her meds and has no bleeding from Xarelto. She continues to work at [employer] and has no difficulty with that.  03/19/2016 - 6 months follow-up, the patient states that she has noticed some mild worsening lower extremity edema, she denies worsening shortness of breath and no chest pain. She continues to work at [employer] for 4 hours today. She has had no problems finishing the shift. She has been compliant to her medicines and denies any palpitations or syncope. She has lost 10 pounds recently.  09/09/2016 - 3 months follow-up, patient states that she has been doing well, she has had stable weight other than last couple  days as she has been eating salty diet including ham. As a result she took extra Lasix in the last few days. She denies any worsening dyspnea on exertion no chest pain no orthopnea, proximal nocturnal dyspnea. She has stable chronic mild lower extremity edema. She denies palpitations or syncope. She needs refill for sublingual nitroglycerin. She has no bleeding with Xarelto.   Home  Medications  Prior to Admission medications   Medication Sig Start Date End Date Taking? Authorizing Provider  acetaminophen (TYLENOL) 500 MG tablet Take 1,000 mg by mouth every 6 (six) hours as needed for pain.   Yes Historical Provider, MD  digoxin (LANOXIN) 0.25 MG tablet Take 1 tablet (0.25 mg total) by mouth daily. 02/23/13  Yes Natasha Mire, NP  furosemide (LASIX) 40 MG tablet Take 1.5 tablets (60 mg total) by mouth 2 (two) times daily. 02/23/13  Yes Natasha Mire, NP  lisinopril (PRINIVIL,ZESTRIL) 2.5 MG tablet Take 1 tablet (2.5 mg total) by mouth daily. 02/23/13  Yes Natasha Mire, NP  metoprolol succinate (TOPROL-XL) 50 MG 24 hr tablet Take 1 tablet (50 mg total) by mouth daily. Take with or immediately following a meal. 02/23/13  Yes Natasha Mire, NP  neomycin-polymyxin-pramoxine (NEOSPORIN PLUS) 1 % cream Apply 1 application topically as needed (rash).   Yes Historical Provider, MD  potassium chloride SA (K-DUR,KLOR-CON) 20 MEQ tablet Take 2 tablets (40 mEq total) by mouth 2 (two) times daily. 02/23/13  Yes Natasha Mire, NP  Rivaroxaban (XARELTO) 20 MG TABS tablet Take 1 tablet (20 mg total) by mouth daily. 02/23/13  Yes Natasha Mire, NP  spironolactone (ALDACTONE) 25 MG tablet Take 0.5 tablets (12.5 mg total) by mouth daily. 02/23/13  Yes Natasha Mire, NP   Review of Systems  All other systems reviewed and are otherwise negative except as noted above.  PE:  BP: 124/64, pulse 93, weight 201 pounds.  General: Pleasant, NAD Psych: Normal affect. Neuro: Alert and oriented X 3. Moves all extremities spontaneously. HEENT: Normal  Neck: Supple without bruits or JVD. Lungs:  Resp regular and unlaboredclear to auscultation bilaterally Heart: RRR no s3, s4, or murmurs. Abdomen: Soft, obese, non-tender, non-distended, BS + x 4.  Extremities: No clubbing, cyanosis, B/L lower extremity edema up to mid calves. DP/PT/Radials 2+ and equal  bilaterally.  Accessory Clinical Findings  2D Echocardiogram 9.9.2014  Study Conclusions  - Left ventricle: Diffuse hypokinesis worse in the inferior wall The cavity size was normal. Wall thickness was normal. Systolic function was moderately to severely reduced. The estimated ejection fraction was in the range of 30% to 35%. Diffuse hypokinesis. - Left atrium: The atrium was moderately dilated. - Right atrium: The atrium was moderately dilated. - Atrial septum: No defect or patent foramen ovale was identified.  _____________  Echo 04/21/2013 - Left ventricle: Worse in the septum and apex The cavity size was normal. Wall thickness was increased in a pattern of mild LVH. Systolic function was mildly to moderately reduced. The estimated ejection fraction was in the range of 40% to 45%. Diffuse hypokinesis. - Right atrium: The atrium was mildly dilated.  EKG marked sinus bradycardia 38 beats per minute, ST and T wave abnormality suspicious for possible inferior and anterolateral ischemia possible digitalis effect.  Echo 08/13/2013 Left ventricle: The cavity size was normal. Systolic function was normal. The estimated ejection fraction was in the range of 50% to 55%. Images were inadequate for LV wall motion assessment.  TTE 07/2014 - Left ventricle:  The cavity size was mildly dilated. Wall thickness was normal. Systolic function was mildly reduced. The estimated ejection fraction was in the range of 45% to 50%. - Left atrium: The atrium was severely dilated. - Right atrium: The atrium was moderately dilated. - Atrial septum: No defect or patent foramen ovale was identified.   ASSESSMENT AND PLAN:   1. Acute on chronic combined systolic and diastolic CHF, non-ischemic, tachycardia induced - LVEF now back to normal 30--> 50-55% in march 2015 - 45-50% in 08/2014. She is up 4 pounds with mild lower extremity edema and clear lungs. We'll check BMP and BNP today. Advised to take  an extra Lasix in the afternoon in the next few days.  2. Afib RVR: S/p TEE and 2 attempted DCCV on 02/18/13 and 02/22/13. Pt was unable to maintain sinus rhythm for longer than few seconds. Her left atrium was noted to be severely dilated at 5.7 cm. Now rate controlled on Toprol XL and Diltiazem CD 120 mg po daily. No palpitation sor synocpe. No bleeding with Xarelto.  3.  Hypertension - controlled, change regimen as above  4. Obesity she is encouraged to further try to lose more weight.   Follow up in 3 months.  Natasha Dawley, MD 09/09/2016, 4:40 PM

## 2016-09-10 ENCOUNTER — Telehealth: Payer: Self-pay | Admitting: *Deleted

## 2016-09-10 LAB — CBC WITH DIFFERENTIAL/PLATELET
Basophils Absolute: 0.1 10*3/uL (ref 0.0–0.2)
Basos: 1 %
EOS (ABSOLUTE): 0.3 10*3/uL (ref 0.0–0.4)
Eos: 4 %
Hematocrit: 43.9 % (ref 34.0–46.6)
Hemoglobin: 14.6 g/dL (ref 11.1–15.9)
Immature Grans (Abs): 0.1 10*3/uL (ref 0.0–0.1)
Immature Granulocytes: 1 %
Lymphocytes Absolute: 2.6 10*3/uL (ref 0.7–3.1)
Lymphs: 29 %
MCH: 31 pg (ref 26.6–33.0)
MCHC: 33.3 g/dL (ref 31.5–35.7)
MCV: 93 fL (ref 79–97)
Monocytes Absolute: 0.9 10*3/uL (ref 0.1–0.9)
Monocytes: 10 %
Neutrophils Absolute: 4.9 10*3/uL (ref 1.4–7.0)
Neutrophils: 55 %
Platelets: 265 10*3/uL (ref 150–379)
RBC: 4.71 x10E6/uL (ref 3.77–5.28)
RDW: 14 % (ref 12.3–15.4)
WBC: 8.8 10*3/uL (ref 3.4–10.8)

## 2016-09-10 LAB — COMPREHENSIVE METABOLIC PANEL
ALT: 10 IU/L (ref 0–32)
AST: 13 IU/L (ref 0–40)
Albumin/Globulin Ratio: 1.2 (ref 1.2–2.2)
Albumin: 3.7 g/dL (ref 3.5–4.8)
Alkaline Phosphatase: 108 IU/L (ref 39–117)
BUN/Creatinine Ratio: 16 (ref 12–28)
BUN: 16 mg/dL (ref 8–27)
Bilirubin Total: 0.4 mg/dL (ref 0.0–1.2)
CO2: 24 mmol/L (ref 18–29)
Calcium: 8.8 mg/dL (ref 8.7–10.3)
Chloride: 99 mmol/L (ref 96–106)
Creatinine, Ser: 0.98 mg/dL (ref 0.57–1.00)
GFR calc Af Amer: 68 mL/min/{1.73_m2} (ref >59)
GFR calc non Af Amer: 59 mL/min/{1.73_m2} — ABNORMAL LOW (ref >59)
Globulin, Total: 3 g/dL (ref 1.5–4.5)
Glucose: 85 mg/dL (ref 65–99)
Potassium: 4.2 mmol/L (ref 3.5–5.2)
Sodium: 141 mmol/L (ref 134–144)
Total Protein: 6.7 g/dL (ref 6.0–8.5)

## 2016-09-10 LAB — TSH: TSH: 4.8 u[IU]/mL — ABNORMAL HIGH (ref 0.450–4.500)

## 2016-09-10 LAB — PRO B NATRIURETIC PEPTIDE: NT-Pro BNP: 1355 pg/mL — ABNORMAL HIGH (ref 0–301)

## 2016-09-10 MED ORDER — FUROSEMIDE 40 MG PO TABS: ORAL_TABLET | ORAL | 0 refills | Status: DC

## 2016-09-10 NOTE — Telephone Encounter (Signed)
-----   Message from Dorothy Spark, MD sent at 09/10/2016 12:30 PM EDT ----- Normal Crea, labs consistent with CHF, advise her to take Lasix 40 mg po BID for the next 10 days, than back to lasix 40 mg po daily. Watch salt intake. TSH borderline, we will follow.

## 2016-09-10 NOTE — Telephone Encounter (Signed)
Notified the pt that per Dr Meda Coffee, her lab showed normal creatinine, but BNP is consistent with CHF, and she recommends that she take lasix 40 mg po BID for 10 days only, then take lasix 40 mg by mouth daily thereafter.  Advised the pt to watch her salt intake.  Informed the pt that per Dr Meda Coffee, her TSH is borderline and we will continue to follow. Confirmed the pharmacy of choice with the pt.  Advised the pt that when she is taking her lasix bid, she should take her 1st dose at 8 am and the 2nd dose at 2 pm.  Pt verbalized understanding and agrees with this plan.

## 2016-09-16 DIAGNOSIS — S0100XD Unspecified open wound of scalp, subsequent encounter: Secondary | ICD-10-CM | POA: Diagnosis not present

## 2016-09-27 ENCOUNTER — Other Ambulatory Visit: Payer: Self-pay | Admitting: Cardiology

## 2016-09-27 DIAGNOSIS — R0602 Shortness of breath: Secondary | ICD-10-CM

## 2016-09-27 DIAGNOSIS — I428 Other cardiomyopathies: Secondary | ICD-10-CM

## 2016-09-27 DIAGNOSIS — I5041 Acute combined systolic (congestive) and diastolic (congestive) heart failure: Secondary | ICD-10-CM

## 2016-09-27 DIAGNOSIS — I482 Chronic atrial fibrillation, unspecified: Secondary | ICD-10-CM

## 2016-11-14 DIAGNOSIS — S0100XD Unspecified open wound of scalp, subsequent encounter: Secondary | ICD-10-CM | POA: Diagnosis not present

## 2016-12-16 DIAGNOSIS — S0100XD Unspecified open wound of scalp, subsequent encounter: Secondary | ICD-10-CM | POA: Diagnosis not present

## 2017-01-27 DIAGNOSIS — S0100XD Unspecified open wound of scalp, subsequent encounter: Secondary | ICD-10-CM | POA: Diagnosis not present

## 2017-02-07 ENCOUNTER — Encounter: Payer: Self-pay | Admitting: Cardiology

## 2017-02-07 ENCOUNTER — Ambulatory Visit (INDEPENDENT_AMBULATORY_CARE_PROVIDER_SITE_OTHER): Payer: Medicare Other | Admitting: Cardiology

## 2017-02-07 VITALS — BP 136/76 | HR 72 | Ht 62.0 in | Wt 220.2 lb

## 2017-02-07 DIAGNOSIS — I10 Essential (primary) hypertension: Secondary | ICD-10-CM

## 2017-02-07 DIAGNOSIS — I428 Other cardiomyopathies: Secondary | ICD-10-CM | POA: Diagnosis not present

## 2017-02-07 DIAGNOSIS — Z7901 Long term (current) use of anticoagulants: Secondary | ICD-10-CM | POA: Diagnosis not present

## 2017-02-07 DIAGNOSIS — I481 Persistent atrial fibrillation: Secondary | ICD-10-CM

## 2017-02-07 DIAGNOSIS — I4819 Other persistent atrial fibrillation: Secondary | ICD-10-CM

## 2017-02-07 DIAGNOSIS — I5041 Acute combined systolic (congestive) and diastolic (congestive) heart failure: Secondary | ICD-10-CM

## 2017-02-07 MED ORDER — POTASSIUM CHLORIDE CRYS ER 20 MEQ PO TBCR: 20.0000 meq | EXTENDED_RELEASE_TABLET | Freq: Every day | ORAL | 3 refills | Status: DC

## 2017-02-07 MED ORDER — FUROSEMIDE 40 MG PO TABS: 40.0000 mg | ORAL_TABLET | Freq: Two times a day (BID) | ORAL | 3 refills | Status: DC

## 2017-02-07 NOTE — Progress Notes (Signed)
Patient ID: Natasha Dickson, female   DOB: September 21, 1945, 71 y.o.   MRN: [id number]    Patient Name: Natasha Dickson Date of Encounter: 02/07/2017  Primary Care Provider:  Tonia Ghent, MD Primary Cardiologist:  Ena Dawley  Chief complain: Follow-up for acute on chronic combined diastolic and systolic CHF   Problem List   Past Medical History:  Diagnosis Date  . Atrial fibrillation ([place])    a. 02/2013: s/p TEE and attempted cardioversion x 2-->failed-->rate controlled with bb/digoxin, Xarelto initiated.  . Breast cancer ([place]) 1999   Stage I in left breast  . Chronic systolic CHF (congestive heart failure) ([place])    a. 02/2013 Echo: EF 30-35%, diff HK, mod dil LA/RA.  Marland Kitchen Closed fracture of unspecified part of neck of femur 2011   Right.  Soreness when standing. (Dr. Durward Fortes)  . Nonischemic cardiomyopathy ([place])    a. 02/2013 Lexi CL: No ischemia, prob attenuation vs scar, EF 40%-->Med Rx.   Past Surgical History:  Procedure Laterality Date  . CARDIOVERSION N/A 02/18/2013   Procedure: CARDIOVERSION;  Surgeon: Jolaine Artist, MD;  Location: [facility] ENDOSCOPY;  Service: Cardiovascular;  Laterality: N/A;  . CARDIOVERSION N/A 02/22/2013   Procedure: CARDIOVERSION;  Surgeon: Fay Records, MD;  Location: [facility];  Service: Cardiovascular;  Laterality: N/A;  . MASTECTOMY  1999   Left breast with saline implant.  Marland Kitchen PARTIAL HIP ARTHROPLASTY Right   . TEE WITHOUT CARDIOVERSION N/A 02/18/2013   Procedure: TRANSESOPHAGEAL ECHOCARDIOGRAM (TEE);  Surgeon: Jolaine Artist, MD;  Location: [facility] ENDOSCOPY;  Service: Cardiovascular;  Laterality: N/A;  . tubal ligation    . TUBAL LIGATION     Allergies  No Known Allergies  HPI  71 year old female that was Admitted to [facility] in 2014 with combined systolic and diastolic CHF,  and A-fib with RVR. Her original echocardiogram showed LVEF 30-35% with diffuse hypokinesis and her stress test was negative for ischemia, her cardiomyopathy  was believed to be secondary to tachycardia associated with atrial fibrillation. She originally lost significant amount of weight was also diuresed, LVEF improved to normal 50-55% in 2015 but then decreased to 45-50% in 2016. She goes through things with her weight up and down 2030 pounds, she is not very compliant with healthy diet and drinks a lot of soda. Her weight has been as low as 170 pounds and as high as 230 pounds. She was cardioverted twice but went back to atrial fibrillation so currently we are just aiming for rate controlled. Today she is stating that she has been so significant amount of stress as she lost her 30 year old son secondary to complication from pulmonary hypertension. She has unintentionally lost 20 pounds. She denies any chest pain, shortness of breath, lower extremity edema, orthopnea or proximal nocturnal dyspnea denies any palpitations. She is compliant to her meds and has no bleeding from Xarelto. She continues to work at [employer] and has no difficulty with that.  03/19/2016 - 6 months follow-up, the patient states that she has noticed some mild worsening lower extremity edema, she denies worsening shortness of breath and no chest pain. She continues to work at [employer] for 4 hours today. She has had no problems finishing the shift. She has been compliant to her medicines and denies any palpitations or syncope. She has lost 10 pounds recently.  09/09/2016 - 3 months follow-up, patient states that she has been doing well, she has had stable weight other than last  couple days as she has been eating salty diet including ham. As a result she took extra Lasix in the last few days. She denies any worsening dyspnea on exertion no chest pain no orthopnea, proximal nocturnal dyspnea. She has stable chronic mild lower extremity edema. She denies palpitations or syncope. She needs refill for sublingual nitroglycerin. She has no bleeding with Xarelto.   02/07/2017, this  is 5 months follow-up, in the last 5 months the patient started to drink sugared drinks again and has gained 20 pounds. She has also noticed worsening dyspnea and mild lower extremity edema. Denies orthopnea or proximal nocturnal dyspnea. She has been compliant with her medications and has no bleeding with Xarelto. No palpitations dizziness or syncope.  Home Medications  Prior to Admission medications   Medication Sig Start Date End Date Taking? Authorizing Provider  acetaminophen (TYLENOL) 500 MG tablet Take 1,000 mg by mouth every 6 (six) hours as needed for pain.   Yes Historical Provider, MD  digoxin (LANOXIN) 0.25 MG tablet Take 1 tablet (0.25 mg total) by mouth daily. 02/23/13  Yes Rogelia Mire, NP  furosemide (LASIX) 40 MG tablet Take 1.5 tablets (60 mg total) by mouth 2 (two) times daily. 02/23/13  Yes Rogelia Mire, NP  lisinopril (PRINIVIL,ZESTRIL) 2.5 MG tablet Take 1 tablet (2.5 mg total) by mouth daily. 02/23/13  Yes Rogelia Mire, NP  metoprolol succinate (TOPROL-XL) 50 MG 24 hr tablet Take 1 tablet (50 mg total) by mouth daily. Take with or immediately following a meal. 02/23/13  Yes Rogelia Mire, NP  neomycin-polymyxin-pramoxine (NEOSPORIN PLUS) 1 % cream Apply 1 application topically as needed (rash).   Yes Historical Provider, MD  potassium chloride SA (K-DUR,KLOR-CON) 20 MEQ tablet Take 2 tablets (40 mEq total) by mouth 2 (two) times daily. 02/23/13  Yes Rogelia Mire, NP  Rivaroxaban (XARELTO) 20 MG TABS tablet Take 1 tablet (20 mg total) by mouth daily. 02/23/13  Yes Rogelia Mire, NP  spironolactone (ALDACTONE) 25 MG tablet Take 0.5 tablets (12.5 mg total) by mouth daily. 02/23/13  Yes Rogelia Mire, NP   Review of Systems  All other systems reviewed and are otherwise negative except as noted above.  PE:  BP: 124/64, pulse 52, weight 220 pounds.  General: Pleasant, NAD Psych: Normal affect. Neuro: Alert and oriented X 3. Moves all  extremities spontaneously. HEENT: Normal  Neck: Supple without bruits or JVD. Lungs:  Resp regular, mild crackles at bases bilaterally Heart: RRR no s3, s4, or murmurs. Abdomen: Soft, obese, non-tender, non-distended, BS + x 4.  Extremities: No clubbing, cyanosis, B/L lower extremity edema up to mid calves. DP/PT/Radials 2+ and equal bilaterally.  Accessory Clinical Findings  2D Echocardiogram 9.9.2014  Study Conclusions  - Left ventricle: Diffuse hypokinesis worse in the inferior wall The cavity size was normal. Wall thickness was normal. Systolic function was moderately to severely reduced. The estimated ejection fraction was in the range of 30% to 35%. Diffuse hypokinesis. - Left atrium: The atrium was moderately dilated. - Right atrium: The atrium was moderately dilated. - Atrial septum: No defect or patent foramen ovale was identified.  _____________  Echo 04/21/2013 - Left ventricle: Worse in the septum and apex The cavity size was normal. Wall thickness was increased in a pattern of mild LVH. Systolic function was mildly to moderately reduced. The estimated ejection fraction was in the range of 40% to 45%. Diffuse hypokinesis. - Right atrium: The atrium was mildly dilated.  EKG marked  sinus bradycardia 38 beats per minute, ST and T wave abnormality suspicious for possible inferior and anterolateral ischemia possible digitalis effect.  Echo 08/13/2013 Left ventricle: The cavity size was normal. Systolic function was normal. The estimated ejection fraction was in the range of 50% to 55%. Images were inadequate for LV wall motion assessment.  TTE 07/2014 - Left ventricle: The cavity size was mildly dilated. Wall thickness was normal. Systolic function was mildly reduced. The estimated ejection fraction was in the range of 45% to 50%. - Left atrium: The atrium was severely dilated. - Right atrium: The atrium was moderately dilated. - Atrial septum: No defect or  patent foramen ovale was identified.    ASSESSMENT AND PLAN:   1. Acute on chronic combined systolic and diastolic CHF, non-ischemic, tachycardia induced - LVEF now back to normal 30--> 50-55% in march 2015 - 45-50% in 08/2014. She is up 20pounds with mild lower extremity edema and mild crackles in her lungs. We'll check BMP and BNP today. We'll increase Lasix to 40 mg by mouth twice a day.  2. Afib RVR: S/p TEE and 2 attempted DCCV on 02/18/13 and 02/22/13. Pt was unable to maintain sinus rhythm for longer than few seconds. Her left atrium was noted to be severely dilated at 5.7 cm. Now rate controlled on Toprol XL and Diltiazem CD 120 mg po daily. No palpitation sor synocpe. No bleeding with Xarelto.  3.  Hypertension - controlled, change regimen as above  4. Obesity she is encouraged to further try to lose more weight. Strongly encouraged to stop drinking sugared drinks.  Follow up in 6 weeks.  Ena Dawley, MD 02/07/2017, 2:09 PM

## 2017-02-07 NOTE — Patient Instructions (Signed)
Medication Instructions:  1. INCREASE LASIX TO 40 MG TWICE DAILY; NEW RX HAS BEEN SENT IN  2. INCREASE POTASSIUM TO 20 MEQ DAILY; NEW RX HAS BEEN SENT IN  Labwork: TODAY BMET, CBC, LIVER FUNCTION, MAGNESIUM LEVEL, PRO BNP  Testing/Procedures: NONE ORDERED   Follow-Up: DR. Meda Coffee IN 6 WEEKS   Any Other Special Instructions Will Be Listed Below (If Applicable).     If you need a refill on your cardiac medications before your next appointment, please call your pharmacy.

## 2017-02-08 LAB — BASIC METABOLIC PANEL
BUN/Creatinine Ratio: 16 (ref 12–28)
BUN: 20 mg/dL (ref 8–27)
CO2: 22 mmol/L (ref 20–29)
Calcium: 9.1 mg/dL (ref 8.7–10.3)
Chloride: 102 mmol/L (ref 96–106)
Creatinine, Ser: 1.28 mg/dL — ABNORMAL HIGH (ref 0.57–1.00)
GFR calc Af Amer: 49 mL/min/{1.73_m2} — ABNORMAL LOW (ref >59)
GFR calc non Af Amer: 42 mL/min/{1.73_m2} — ABNORMAL LOW (ref >59)
Glucose: 92 mg/dL (ref 65–99)
Potassium: 4.1 mmol/L (ref 3.5–5.2)
Sodium: 140 mmol/L (ref 134–144)

## 2017-02-08 LAB — HEPATIC FUNCTION PANEL
ALT: 8 IU/L (ref 0–32)
AST: 15 IU/L (ref 0–40)
Albumin: 4 g/dL (ref 3.5–4.8)
Alkaline Phosphatase: 98 IU/L (ref 39–117)
Bilirubin Total: 0.5 mg/dL (ref 0.0–1.2)
Bilirubin, Direct: 0.12 mg/dL (ref 0.00–0.40)
Total Protein: 6.9 g/dL (ref 6.0–8.5)

## 2017-02-08 LAB — PRO B NATRIURETIC PEPTIDE: NT-Pro BNP: 912 pg/mL — ABNORMAL HIGH (ref 0–301)

## 2017-02-08 LAB — CBC
Hematocrit: 44.7 % (ref 34.0–46.6)
Hemoglobin: 15.1 g/dL (ref 11.1–15.9)
MCH: 31.6 pg (ref 26.6–33.0)
MCHC: 33.8 g/dL (ref 31.5–35.7)
MCV: 94 fL (ref 79–97)
Platelets: 260 10*3/uL (ref 150–379)
RBC: 4.78 x10E6/uL (ref 3.77–5.28)
RDW: 14.4 % (ref 12.3–15.4)
WBC: 7.8 10*3/uL (ref 3.4–10.8)

## 2017-02-08 LAB — MAGNESIUM: Magnesium: 2.3 mg/dL (ref 1.6–2.3)

## 2017-02-11 ENCOUNTER — Telehealth: Payer: Self-pay | Admitting: *Deleted

## 2017-02-11 NOTE — Telephone Encounter (Signed)
ok 

## 2017-02-11 NOTE — Telephone Encounter (Signed)
Spoke with the pt and she called in to schedule her 6 week follow-up with Dr Meda Coffee, as advised by Dr Meda Coffee at her last OV with the pt on 8/31. Pt states she will be unable to come into the office in 6 weeks, but she can come in the week after that.  Offered the pt an appt to see Dr Meda Coffee on 10/25 at 1:20.  Pt is aware that she needs to arrive 15 mins prior too that appt.  Pt would like for me to send this message to Dr Meda Coffee as an Juluis Rainier, to make her aware that she can come to see her in 7 weeks, not 6, due to scheduling conflict.  Informed the pt that I will make Dr Meda Coffee aware of this.  Pt verbalized understanding and agrees with this plan.  Pt gracious for all the assistance provided.

## 2017-02-12 ENCOUNTER — Telehealth: Payer: Self-pay | Admitting: Cardiology

## 2017-02-12 DIAGNOSIS — I420 Dilated cardiomyopathy: Secondary | ICD-10-CM

## 2017-02-12 DIAGNOSIS — I5042 Chronic combined systolic (congestive) and diastolic (congestive) heart failure: Secondary | ICD-10-CM

## 2017-02-12 DIAGNOSIS — I4891 Unspecified atrial fibrillation: Secondary | ICD-10-CM

## 2017-02-12 DIAGNOSIS — I5041 Acute combined systolic (congestive) and diastolic (congestive) heart failure: Secondary | ICD-10-CM

## 2017-02-12 DIAGNOSIS — R7989 Other specified abnormal findings of blood chemistry: Secondary | ICD-10-CM | POA: Insufficient documentation

## 2017-02-12 NOTE — Telephone Encounter (Signed)
Notified the pt that per Dr Meda Coffee, her labs showed normal CBC, evidence of CHF, creatinine slightly elevated, and we will recheck a BMET and PRO-BNP at her next OV with Dr Meda Coffee on 04/03/17.  Informed the pt that per Dr Meda Coffee, she should take her medications as directed at the last OV with Dr Meda Coffee.  Scheduled the pt a lab appt for 04/03/17 to recheck a bmet and pro-bnp.  Advised the pt to arrive a few mins early, so her labs can be checked prior to seeing Dr Meda Coffee.  Pt verbalized understanding and agrees with this plan.

## 2017-02-12 NOTE — Telephone Encounter (Signed)
New message   Pt is calling to check on blood test that was  taken

## 2017-02-12 NOTE — Telephone Encounter (Signed)
Normal CBC, evidence of CHF, Crea slightly elevated, we will repeat at the next visit, medications as directed at the visit last week.

## 2017-02-12 NOTE — Telephone Encounter (Signed)
Dr. Meda Coffee, for some reason the labs you ordered on the pt last Friday, did not make it to your in-basket for further review. Can you result them? Pt is calling for her results.  Thanks,  [facility]

## 2017-03-03 DIAGNOSIS — S0100XD Unspecified open wound of scalp, subsequent encounter: Secondary | ICD-10-CM | POA: Diagnosis not present

## 2017-03-17 ENCOUNTER — Telehealth: Payer: Self-pay | Admitting: Cardiology

## 2017-03-17 NOTE — Telephone Encounter (Signed)
Pt states she received a MyChart message from Dr Meda Coffee that she had a letter in the system, she cannot get logged in to [place].  Pt advised Epic indicates bulk e-mail sent out 03/16/17 about flu vaccine, I read letter to her, offered to send her a copy, she said she did not need a copy of the letter, thanked me for the call.

## 2017-03-17 NOTE — Telephone Encounter (Signed)
New message    Pt is calling asking for a call back.

## 2017-03-25 DIAGNOSIS — Z23 Encounter for immunization: Secondary | ICD-10-CM | POA: Diagnosis not present

## 2017-03-29 ENCOUNTER — Other Ambulatory Visit: Payer: Self-pay | Admitting: Cardiology

## 2017-03-29 DIAGNOSIS — Z7901 Long term (current) use of anticoagulants: Secondary | ICD-10-CM

## 2017-03-29 DIAGNOSIS — I4891 Unspecified atrial fibrillation: Secondary | ICD-10-CM

## 2017-03-29 DIAGNOSIS — I5042 Chronic combined systolic (congestive) and diastolic (congestive) heart failure: Secondary | ICD-10-CM

## 2017-03-29 DIAGNOSIS — E782 Mixed hyperlipidemia: Secondary | ICD-10-CM

## 2017-03-29 DIAGNOSIS — I482 Chronic atrial fibrillation, unspecified: Secondary | ICD-10-CM

## 2017-03-29 DIAGNOSIS — I10 Essential (primary) hypertension: Secondary | ICD-10-CM

## 2017-03-31 ENCOUNTER — Other Ambulatory Visit: Payer: Self-pay | Admitting: Cardiology

## 2017-03-31 ENCOUNTER — Telehealth: Payer: Self-pay | Admitting: Cardiology

## 2017-03-31 DIAGNOSIS — I482 Chronic atrial fibrillation, unspecified: Secondary | ICD-10-CM

## 2017-03-31 DIAGNOSIS — I4891 Unspecified atrial fibrillation: Secondary | ICD-10-CM

## 2017-03-31 DIAGNOSIS — E782 Mixed hyperlipidemia: Secondary | ICD-10-CM

## 2017-03-31 DIAGNOSIS — I10 Essential (primary) hypertension: Secondary | ICD-10-CM

## 2017-03-31 DIAGNOSIS — Z7901 Long term (current) use of anticoagulants: Secondary | ICD-10-CM

## 2017-03-31 DIAGNOSIS — I5042 Chronic combined systolic (congestive) and diastolic (congestive) heart failure: Secondary | ICD-10-CM

## 2017-03-31 MED ORDER — DILTIAZEM HCL ER COATED BEADS 120 MG PO CP24: 120.0000 mg | ORAL_CAPSULE | ORAL | 3 refills | Status: DC

## 2017-03-31 NOTE — Telephone Encounter (Signed)
Left message to call back  

## 2017-03-31 NOTE — Telephone Encounter (Signed)
Patient wanted to make sure she had a refill for cardizem. Informed patient that her medication has already been refilled. Patient verbalized understanding and will call with any other questions if needed.

## 2017-03-31 NOTE — Telephone Encounter (Signed)
Follow up:  ° ° °Patient returning call. Please call back °

## 2017-03-31 NOTE — Telephone Encounter (Signed)
New message     Patient would like to speak to you about her medications, please call , pt has appointment Thursday and she has enough to get her through

## 2017-04-03 ENCOUNTER — Encounter: Payer: Self-pay | Admitting: Cardiology

## 2017-04-03 ENCOUNTER — Ambulatory Visit (INDEPENDENT_AMBULATORY_CARE_PROVIDER_SITE_OTHER): Payer: Medicare Other | Admitting: Cardiology

## 2017-04-03 ENCOUNTER — Other Ambulatory Visit: Payer: Medicare Other | Admitting: *Deleted

## 2017-04-03 VITALS — BP 118/82 | HR 108 | Ht 62.0 in | Wt 215.0 lb

## 2017-04-03 DIAGNOSIS — I5041 Acute combined systolic (congestive) and diastolic (congestive) heart failure: Secondary | ICD-10-CM | POA: Diagnosis not present

## 2017-04-03 DIAGNOSIS — Z7901 Long term (current) use of anticoagulants: Secondary | ICD-10-CM

## 2017-04-03 DIAGNOSIS — I4819 Other persistent atrial fibrillation: Secondary | ICD-10-CM

## 2017-04-03 DIAGNOSIS — I428 Other cardiomyopathies: Secondary | ICD-10-CM

## 2017-04-03 DIAGNOSIS — I481 Persistent atrial fibrillation: Secondary | ICD-10-CM | POA: Diagnosis not present

## 2017-04-03 DIAGNOSIS — I4891 Unspecified atrial fibrillation: Secondary | ICD-10-CM

## 2017-04-03 DIAGNOSIS — I10 Essential (primary) hypertension: Secondary | ICD-10-CM | POA: Diagnosis not present

## 2017-04-03 DIAGNOSIS — I5042 Chronic combined systolic (congestive) and diastolic (congestive) heart failure: Secondary | ICD-10-CM

## 2017-04-03 DIAGNOSIS — R7989 Other specified abnormal findings of blood chemistry: Secondary | ICD-10-CM

## 2017-04-03 DIAGNOSIS — I420 Dilated cardiomyopathy: Secondary | ICD-10-CM

## 2017-04-03 MED ORDER — SPIRONOLACTONE 25 MG PO TABS: 12.5000 mg | ORAL_TABLET | Freq: Every day | ORAL | 3 refills | Status: DC

## 2017-04-03 NOTE — Patient Instructions (Signed)
Medication Instructions:   START TAKING SPIRONOLACTONE 12.5 MG ONCE DAILY    Labwork:  TODAY-BMET AND PRO-BNP    Follow-Up:  3 MONTHS WITH DR Meda Coffee       If you need a refill on your cardiac medications before your next appointment, please call your pharmacy.

## 2017-04-03 NOTE — Progress Notes (Signed)
Cardiology Office Note    Date:  04/03/2017   ID:  Natasha Dickson, DOB 1946/03/07, MRN [id number]  PCP:  Tonia Ghent, MD  Cardiologist:  Ena Dawley, MD   No chief complaint on file.   History of Present Illness:  Natasha Dickson is a 71 y.o. female admitted in  2014 with combined systolic and diastolic CHF,  and A-fib with RVR. Her original echocardiogram showed LVEF 30-35% with diffuse hypokinesis and her stress test was negative for ischemia, her cardiomyopathy was believed to be secondary to tachycardia associated with atrial fibrillation. She originally lost significant amount of weight was also diuresed, LVEF improved to normal 50-55% in 2015 but then decreased to 45-50% in 2016. She goes through things with her weight up and down 2030 pounds, she is not very compliant with healthy diet and drinks a lot of soda. Her weight has been as low as 170 pounds and as high as 230 pounds. She was cardioverted twice but went back to atrial fibrillation so currently we are just aiming for rate controlled.   02/07/2017, this is 5 months follow-up, in the last 5 months the patient started to drink sugared drinks again and has gained 20 pounds. She has also noticed worsening dyspnea and mild lower extremity edema. Denies orthopnea or proximal nocturnal dyspnea. She has been compliant with her medications and has no bleeding with Xarelto. No palpitations dizziness or syncope.  04/03/2017 - two months follow-up, at the last visit she was up 20 pounds and Lasix was increased to 40 mg by mouth twice a day, she has cut down on her sugary drinks and Saturday. Today she states that she feels slightly better however she has persistent mild lower extremity edema and occasional paroxysmal nocturnal dyspnea. 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 She doesn't exercise. She's been compliant with her medication and has no bleeding.  Past Medical History:  Diagnosis Date  . Atrial  fibrillation ([place])    a. 02/2013: s/p TEE and attempted cardioversion x 2-->failed-->rate controlled with bb/digoxin, Xarelto initiated.  . Breast cancer ([place]) 1999   Stage I in left breast  . Chronic systolic CHF (congestive heart failure) ([place])    a. 02/2013 Echo: EF 30-35%, diff HK, mod dil LA/RA.  Marland Kitchen Closed fracture of unspecified part of neck of femur 2011   Right.  Soreness when standing. (Dr. Durward Fortes)  . Nonischemic cardiomyopathy ([place])    a. 02/2013 Lexi CL: No ischemia, prob attenuation vs scar, EF 40%-->Med Rx.    Past Surgical History:  Procedure Laterality Date  . CARDIOVERSION N/A 02/18/2013   Procedure: CARDIOVERSION;  Surgeon: Jolaine Artist, MD;  Location: [facility] ENDOSCOPY;  Service: Cardiovascular;  Laterality: N/A;  . CARDIOVERSION N/A 02/22/2013   Procedure: CARDIOVERSION;  Surgeon: Fay Records, MD;  Location: [place];  Service: Cardiovascular;  Laterality: N/A;  . MASTECTOMY  1999   Left breast with saline implant.  Marland Kitchen PARTIAL HIP ARTHROPLASTY Right   . TEE WITHOUT CARDIOVERSION N/A 02/18/2013   Procedure: TRANSESOPHAGEAL ECHOCARDIOGRAM (TEE);  Surgeon: Jolaine Artist, MD;  Location: [facility] ENDOSCOPY;  Service: Cardiovascular;  Laterality: N/A;  . tubal ligation    . TUBAL LIGATION      Current Medications: Outpatient Medications Prior to Visit  Medication Sig Dispense Refill  . diltiazem (CARDIZEM CD) 120 MG 24 hr capsule Take 1 capsule (120 mg total) by mouth every morning. 90 capsule 3  . furosemide (LASIX) 40 MG  tablet Take 1 tablet (40 mg total) by mouth 2 (two) times daily. 180 tablet 3  . metoprolol succinate (TOPROL-XL) 100 MG 24 hr tablet TAKE ONE TABLET BY MOUTH ONCE DAILY (TAKE  WITH  OR  IMMEDIATELY  FOLLOWING  A  MEAL) 90 tablet 3  . neomycin-polymyxin-pramoxine (NEOSPORIN PLUS) 1 % cream Apply 1 application topically as needed (rash).    . nitroGLYCERIN (NITROSTAT) 0.4 MG SL tablet Place 1 tablet (0.4 mg total) under the tongue every 5 (five)  minutes as needed for chest pain. 90 tablet 3  . potassium chloride SA (K-DUR,KLOR-CON) 20 MEQ tablet Take 1 tablet (20 mEq total) by mouth daily. 90 tablet 3  . rivaroxaban (XARELTO) 20 MG TABS tablet Take 1 tablet (20 mg total) by mouth daily. 30 tablet 11   No facility-administered medications prior to visit.      Allergies:   Patient has no known allergies.   Social History   Social History  . Marital status: Married    Spouse name: N/A  . Number of children: N/A  . Years of education: N/A   Occupational History  . 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 Smoking status: Never Smoker  . Smokeless tobacco: Never Used  . Alcohol use 0.0 oz/week     Comment: RARE  . Drug use: No  . Sexual activity: No   Other Topics Concern  . None   Social History Hotel manager grad.   Married 40+ years   Scientist, water quality at [employer]     Family History:  The patient's family history includes COPD in her mother; Colon cancer in her father; Congenital heart disease in her son; Heart attack in her father; Heart disease in her brother and father; Hypertension in her father.   ROS:   Please see the history of present illness.    ROS All other systems reviewed and are negative.   PHYSICAL EXAM:   VS:  BP 118/82   Pulse (!) 108   Ht 5\' 2"  (1.575 m)   Wt 215 lb (97.5 kg)   BMI 39.32 kg/m    GEN: Well nourished, well developed, in no acute distress  HEENT: normal  Neck: no JVD, carotid bruits, or masses Cardiac: iRRR; no murmurs, rubs, or gallops,no edema  Respiratory:  clear to auscultation bilaterally, normal work of breathing GI: soft, nontender, nondistended, + BS MS: no deformity or atrophy  Skin: warm and dry, no rash Neuro:  Alert and Oriented x 3, Strength and sensation are intact Psych: euthymic mood, full affect  Wt Readings from Last 3 Encounters:  04/03/17 215 lb (97.5 kg)  02/07/17 220 lb 4 oz (99.9 kg)  09/09/16 214 lb (97.1 kg)      Studies/Labs Reviewed:   EKG:  EKG is ordered today.  The ekg ordered today demonstrates Atrial fibrillation rapid ventricular response, 108 bpm, nonspecific T wave abnormalities. HR has increased since the last visit. This was personally reviewed.  Recent Labs: 09/09/2016: TSH 4.800 02/07/2017: ALT 8; BUN 20; Creatinine, Ser 1.28; Hemoglobin 15.1; Magnesium 2.3; NT-Pro BNP 912; Platelets 260; Potassium 4.1; Sodium 140   Lipid Panel    Component Value Date/Time   CHOL 198 03/11/2016 0911   TRIG 129 03/11/2016 0911   HDL 47 03/11/2016 0911   CHOLHDL 4.2 03/11/2016 0911   VLDL 26 03/11/2016 0911   LDLCALC 125 03/11/2016 0911   LDLDIRECT 137.1 03/29/2011 1016    Additional studies/ records that  were reviewed today include:   TTE 08/05/2014 - Left ventricle: The cavity size was mildly dilated. Wall thickness was normal. Systolic function was mildly reduced. The estimated ejection fraction was in the range of 45% to 50%. - Left atrium: The atrium was severely dilated. - Right atrium: The atrium was moderately dilated. - Atrial septum: No defect or patent foramen ovale was identified.    ASSESSMENT:    1. Acute combined systolic and diastolic heart failure (HCC)   2. Persistent atrial fibrillation ([place])   3. Nonischemic cardiomyopathy ([place])   4. Chronic anticoagulation   5. Essential hypertension      PLAN:  In order of problems listed above:  1. Acute on chronic combined systolic and diastolic CHF, non-ischemic, tachycardia induced - LVEF now back to normal 30--> 50-55% in march 2015 - 45-50% in 08/2014. She has only lost 5 pounds since her last visit, we'll continue Lasix 40 mg by mouth daily at 12.5 mg of Aldactone. I will check BMP and BNP today.  Follow-up in 3 months, she is again advised strongly to what her diet closely, minute sugar readings with her salt intake and walk daily.   2. Afib RVR: S/p TEE and 2 attempted DCCV on 02/18/13 and 02/22/13. She is  asymptomatic, slightly high heart rate today however 52 bpm at the last visit continue the same medications. No bleeding with Xarelto.  3.  Hypertension - controlled, change regimen as above  4. Obesity - advised to exercise- walk daily.   Medication Adjustments/Labs and Tests Ordered: Current medicines are reviewed at length with the patient today.  Concerns regarding medicines are outlined above.  Medication changes, Labs and Tests ordered today are listed in the Patient Instructions below. Patient Instructions  Medication Instructions:   START TAKING SPIRONOLACTONE 12.5 MG ONCE DAILY    Labwork:  TODAY-BMET AND PRO-BNP    Follow-Up:  3 MONTHS WITH DR Meda Coffee       If you need a refill on your cardiac medications before your next appointment, please call your pharmacy.      Signed, Ena Dawley, MD  04/03/2017 1:50 PM    [facility] [address], [place], [place]  [address] Phone: [phone]; Fax: [phone]

## 2017-04-04 ENCOUNTER — Telehealth: Payer: Self-pay | Admitting: Cardiology

## 2017-04-04 LAB — BASIC METABOLIC PANEL
BUN/Creatinine Ratio: 14 (ref 12–28)
BUN: 15 mg/dL (ref 8–27)
CO2: 25 mmol/L (ref 20–29)
Calcium: 9.8 mg/dL (ref 8.7–10.3)
Chloride: 102 mmol/L (ref 96–106)
Creatinine, Ser: 1.08 mg/dL — ABNORMAL HIGH (ref 0.57–1.00)
GFR calc Af Amer: 60 mL/min/{1.73_m2} (ref >59)
GFR calc non Af Amer: 52 mL/min/{1.73_m2} — ABNORMAL LOW (ref >59)
Glucose: 88 mg/dL (ref 65–99)
Potassium: 4.9 mmol/L (ref 3.5–5.2)
Sodium: 141 mmol/L (ref 134–144)

## 2017-04-04 LAB — PRO B NATRIURETIC PEPTIDE: NT-Pro BNP: 1092 pg/mL — ABNORMAL HIGH (ref 0–301)

## 2017-04-04 MED ORDER — SPIRONOLACTONE 25 MG PO TABS: 25.0000 mg | ORAL_TABLET | Freq: Every day | ORAL | 1 refills | Status: DC

## 2017-04-04 NOTE — Telephone Encounter (Signed)
Notified the pt that per Dr Meda Coffee, her labs showed that she has improved creatinine, still in CHF, and she recommends that we increase her spironolactone to 25 mg po daily.  Advised the pt to take a whole pill of the current supply of spironolactone she has a hand.  Informed the pt that I will send in new dose change to her confirmed pharmacy. Pt verbalized understanding and agrees with this plan.

## 2017-04-04 NOTE — Telephone Encounter (Signed)
°  Follow Up  Calling to follow up on blood work results. Please call.

## 2017-04-04 NOTE — Telephone Encounter (Signed)
Pt is calling for her lab results that were done in our office yesterday.  Informed the pt that Dr Meda Coffee has not resulted her labs yet, but once she does, I will shortly follow-up with her thereafter.  Pt verbalized understanding and agrees with this plan.

## 2017-04-04 NOTE — Telephone Encounter (Signed)
-----   Message from Natasha Spark, MD sent at 04/04/2017 12:23 PM EDT ----- [name] has improved Crea, still in CHF, please increase her spironolactone to 25 mg po daily.

## 2017-04-07 DIAGNOSIS — S0100XD Unspecified open wound of scalp, subsequent encounter: Secondary | ICD-10-CM | POA: Diagnosis not present

## 2017-04-23 ENCOUNTER — Telehealth: Payer: Self-pay | Admitting: Cardiology

## 2017-04-23 NOTE — Telephone Encounter (Signed)
New message ° ° ° °Patient calling the office for samples of medication: ° ° °1.  What medication and dosage are you requesting samples for?rivaroxaban (XARELTO) 20 MG TABS tablet ° °2.  Are you currently out of this medication? yes ° ° °

## 2017-04-24 NOTE — Telephone Encounter (Signed)
Spoke with patient and she stated that her copay was $30 and now that she is in the donut hole her copay will be $150. I made her aware that I could place two weeks of samples at the front desk. I mentioned patient assistance but she was not interested in applying. I informed her that we would not be able to supply her with samples to last until the beginning of the year. Patient verbalized her understanding and appreciation for the samples.

## 2017-04-24 NOTE — Telephone Encounter (Signed)
Pt need samples of Xarelto please give her a call back.  Patient calling the office for samples of medication:   1.  What medication and dosage are you requesting samples for? XARELTO 100mg   2.  Are you currently out of this medication? Yes  Donut hole.

## 2017-04-24 NOTE — Telephone Encounter (Signed)
Left vm for patient to return my call. °

## 2017-05-05 ENCOUNTER — Encounter: Payer: Self-pay | Admitting: Family Medicine

## 2017-05-05 DIAGNOSIS — Z853 Personal history of malignant neoplasm of breast: Secondary | ICD-10-CM | POA: Diagnosis not present

## 2017-05-05 DIAGNOSIS — Z1231 Encounter for screening mammogram for malignant neoplasm of breast: Secondary | ICD-10-CM | POA: Diagnosis not present

## 2017-06-05 ENCOUNTER — Encounter: Payer: Self-pay | Admitting: Family Medicine

## 2017-06-05 DIAGNOSIS — R922 Inconclusive mammogram: Secondary | ICD-10-CM | POA: Diagnosis not present

## 2017-06-05 DIAGNOSIS — R928 Other abnormal and inconclusive findings on diagnostic imaging of breast: Secondary | ICD-10-CM | POA: Diagnosis not present

## 2017-06-05 DIAGNOSIS — Z853 Personal history of malignant neoplasm of breast: Secondary | ICD-10-CM | POA: Diagnosis not present

## 2017-06-05 LAB — HM MAMMOGRAPHY

## 2017-06-09 ENCOUNTER — Encounter: Payer: Self-pay | Admitting: Family Medicine

## 2017-06-23 ENCOUNTER — Telehealth: Payer: Self-pay | Admitting: Family Medicine

## 2017-06-23 NOTE — Telephone Encounter (Signed)
Info re: bx.  Patient states the biopsy is scheduled on 06-26-17 at 2 o'clock.

## 2017-06-26 ENCOUNTER — Other Ambulatory Visit: Payer: Self-pay | Admitting: Radiology

## 2017-06-26 DIAGNOSIS — Z853 Personal history of malignant neoplasm of breast: Secondary | ICD-10-CM | POA: Diagnosis not present

## 2017-06-26 DIAGNOSIS — N6313 Unspecified lump in the right breast, lower outer quadrant: Secondary | ICD-10-CM | POA: Diagnosis not present

## 2017-06-26 DIAGNOSIS — D241 Benign neoplasm of right breast: Secondary | ICD-10-CM | POA: Diagnosis not present

## 2017-06-30 ENCOUNTER — Encounter: Payer: Self-pay | Admitting: *Deleted

## 2017-07-16 ENCOUNTER — Telehealth: Payer: Self-pay | Admitting: Cardiology

## 2017-07-16 ENCOUNTER — Ambulatory Visit: Payer: Medicare Other | Admitting: Cardiology

## 2017-07-16 NOTE — Telephone Encounter (Signed)
**Note De-Identified  Obfuscation** The pt states that she is not interested in applying for pt assistance for her Xarelto and states that this is a one time only request as she has a large deductible that she has to pay right now.   I have advised her that Im leaving her 2 bottles og Xarelto samples in our front office so she can pick up at her convenience. She verbalized understanding and thanked me for the samples.

## 2017-07-16 NOTE — Telephone Encounter (Signed)
New Message   Patient calling the office for samples of medication:   1.  What medication and dosage are you requesting samples for? Xarelto  20mg     2.  Are you currently out of this medication? Patient has 5 remaining

## 2017-07-22 ENCOUNTER — Telehealth: Payer: Self-pay | Admitting: Cardiology

## 2017-07-22 NOTE — Telephone Encounter (Signed)
Pt returning your call please give her a call back.

## 2017-07-22 NOTE — Telephone Encounter (Signed)
Informed the pt that our scheduler called her to remind her of her appt next Tuesday 2/19 at 3 pm with Dayna Dunn PA-C.  Pt verbalized understanding.

## 2017-07-28 ENCOUNTER — Encounter: Payer: Self-pay | Admitting: Physician Assistant

## 2017-07-28 NOTE — Progress Notes (Signed)
Cardiology Office Note    Date:  07/29/2017  ID:  Natasha Dickson, DOB 05-20-1946, MRN [id number] PCP:  Tonia Ghent, MD  Cardiologist:  Dr. Meda Coffee   Chief Complaint: f/u CHF, atrial fib  History of Present Illness:  Natasha Dickson is a 72 y.o. female with history of chronic combined CHF (believed to be tachy-mediated), obesity, persistent atrial fib, breast CA who presents for f/u atrial fib and CHF.  She was diagnosed with atrial fib in 2014 when admitted for SOB, CHF, and rapid atrial fib. 2D echo had shown EF 30-35 with diffuse HK with biatrial enlargement. She was diuresed and underwent nuclear stress test which was negative for ischemia, cannot rule out subendocardial scar, EF 40%. Attempts were made at TEE/DCCV x2 but she failed to maintain NSR thereafter therefore has been managed with rate control strategy. Last echo 07/2014 showed mildly dilated LV, EF 45-50%, severely dilated left atrium and moderately dilated right atrium. Last visit 03/2017 for CHF at which time her spironolactone was increased to 60m daily - BNP 1092, K 4.9, Cr 1.08. Last labs otherwise showed normal CBC and Mg 2.3 in 01/2017, TSH of 4.8 in 09/2016.  She presents back for follow-up today feeling fine. She has been doing well the last few months. She denies any issues with palpitations, chest pain, or shortness of breath. She denies any snoring. She takes a nap in the afternoon daily but this has been chronic for many years. She has trace lower extremity edema that comes and goes that she feels is at baseline and doing well these days. Lasix is listed BID but she is now taking it only daily most days. No bleeding on Xarelto.    Past Medical History:  Diagnosis Date  . Breast cancer ([facility] [place]   Stage I in left breast  . Chronic combined systolic and diastolic CHF (congestive heart failure) ([facility]    a. 02/2013 Echo: EF 30-35%, diff HK, biatrial enlargement. b. 2D echo 07/2014 showed mildly dilated LV, EF 45-50%,  severely dilated left atrium and moderately dilated right atrium.  . CKD (chronic kidney disease), stage II   . Closed fracture of unspecified part of neck of femur 2011   Right.  Soreness when standing. (Dr. WDurward Fortes  . Nonischemic cardiomyopathy ([facility]    a. 02/2013 Lexi CL: No ischemia, prob attenuation vs scar, EF 40%-->Med Rx.  . Persistent atrial fibrillation ([facility] [place]    a. 02/2013: s/p TEE and attempted cardioversion x 2-->failed-->rate controlled with bb/digoxin, Xarelto initiated.    Past Surgical History:  Procedure Laterality Date  . CARDIOVERSION N/A 02/18/2013   Procedure: CARDIOVERSION;  Surgeon: DJolaine Artist MD;  [facility];  Service: Cardiovascular;  Laterality: N/A;  . CARDIOVERSION N/A 02/22/2013   Procedure: CARDIOVERSION;  Surgeon: PFay Records MD;  Location: [facility]  Service: Cardiovascular;  Laterality: N/A;  . MASTECTOMY  1999   Left breast with saline implant.  .Marland KitchenPARTIAL HIP ARTHROPLASTY Right   . TEE WITHOUT CARDIOVERSION N/A 02/18/2013   Procedure: TRANSESOPHAGEAL ECHOCARDIOGRAM (TEE);  Surgeon: DJolaine Artist MD;  [facility];  Service: Cardiovascular;  Laterality: N/A;  . tubal ligation    . TUBAL LIGATION      Current Medications: Current Outpatient Medications  Medication Sig Dispense Refill  . diltiazem (CARDIZEM CD) 120 MG 24 hr capsule Take 1 capsule (120 mg total) by mouth every morning. 90 capsule 3  . furosemide (LASIX) 40 MG tablet Take 40 mg by  mouth daily.    . metoprolol succinate (TOPROL-XL) 100 MG 24 hr tablet TAKE ONE TABLET BY MOUTH ONCE DAILY (TAKE  WITH  OR  IMMEDIATELY  FOLLOWING  A  MEAL) 90 tablet 3  . neomycin-polymyxin-pramoxine (NEOSPORIN PLUS) 1 % cream Apply 1 application topically as needed (rash).    . nitroGLYCERIN (NITROSTAT) 0.4 MG SL tablet Place 1 tablet (0.4 mg total) under the tongue every 5 (five) minutes as needed for chest pain. 90 tablet 3  . potassium chloride SA (K-DUR,KLOR-CON) 20  MEQ tablet Take 1 tablet (20 mEq total) by mouth daily. 90 tablet 3  . rivaroxaban (XARELTO) 20 MG TABS tablet Take 1 tablet (20 mg total) by mouth daily. 30 tablet 11  . spironolactone (ALDACTONE) 25 MG tablet Take 1 tablet (25 mg total) by mouth daily. 90 tablet 1   No current facility-administered medications for this visit.      Allergies:   Patient has no known allergies.   Social History   Socioeconomic History  . Marital status: Married    Spouse name: None  . Number of children: None  . Years of education: None  . Highest education level: None  Social Needs  . Financial resource strain: None  . Food insecurity - worry: None  . Food insecurity - inability: None  . Transportation needs - medical: None  . Transportation needs - non-medical: None  Occupational History  . Occupation: Surveyor, quantity: [employer]  Tobacco Use  . Smoking status: Never Smoker  . Smokeless tobacco: Never Used  Substance and Sexual Activity  . Alcohol use: Yes    Alcohol/week: 0.0 oz    Comment: RARE  . Drug use: No  . Sexual activity: No  Other Topics Concern  . None  Social History Hotel manager grad.   Married 40+ years   Scientist, water quality at [employer]     Family History:  Family History  Problem Relation Age of Onset  . Congenital heart disease Son         transposition of great vessels- corrected with  MI  as an adult  . COPD Mother        from long-term smoking  . Colon cancer Father   . Heart disease Father   . Heart attack Father   . Hypertension Father   . Heart disease Brother   . Stroke Neg Hx     ROS:   Please see the history of present illness.  All other systems are reviewed and otherwise negative.    PHYSICAL EXAM:   VS:  BP 124/76   Pulse 61   Ht 5' 1"  (1.549 m)   Wt 214 lb 12 oz (97.4 kg)   SpO2 97%   BMI 40.58 kg/m   BMI: Body mass index is 40.58 kg/m. GEN: Well nourished, well developed obese WF, in no acute distress  HEENT:  normocephalic, atraumatic Neck: no JVD, carotid bruits, or masses Cardiac: RRR; no murmurs, rubs, or gallops, trace pedal edema at ankles with marked varicose veins Respiratory:  clear to auscultation bilaterally, normal work of breathing GI: soft, nontender, nondistended, + BS MS: no deformity or atrophy  Skin: warm and dry, no rash Neuro:  Alert and Oriented x 3, Strength and sensation are intact, follows commands Psych: euthymic mood, full affect  Wt Readings from Last 3 Encounters:  07/29/17 214 lb 12 oz (97.4 kg)  04/03/17 215 lb (97.5 kg)  02/07/17 220 lb 4 oz (  99.9 kg)      Studies/Labs Reviewed:   EKG:  EKG was ordered today and personally reviewed by me and demonstrates atrial fibrillation 68bpm, nonspecific T wave changes, nonacute  Recent Labs: 09/09/2016: TSH 4.800 02/07/2017: ALT 8; Hemoglobin 15.1; Magnesium 2.3; Platelets 260 04/03/2017: BUN 15; Creatinine, Ser 1.08; NT-Pro BNP 1,092; Potassium 4.9; Sodium 141   Lipid Panel    Component Value Date/Time   CHOL 198 03/11/2016 0911   TRIG 129 03/11/2016 0911   HDL 47 03/11/2016 0911   CHOLHDL 4.2 03/11/2016 0911   VLDL 26 03/11/2016 0911   LDLCALC 125 03/11/2016 0911   LDLDIRECT 137.1 03/29/2011 1016    Additional studies/ records that were reviewed today include: Summarized above.    ASSESSMENT & PLAN:   1. Persistent atrial fib - rate controlled on present regimen. Asymptomatic. Continue. She requested more refills on Xarelto. Per our samples department she has met the [place] for allowed samples. The patient apparently previously declined to fill out assistance paperwork. She states it's moreso just an issue with her deductible renewing at the beginning of the year, rather than needing assistance. She will fill as usual. She was given additional samples today to give her time to refill her rx. We sent in refills as well. Will update surveillance CBC and BMET. Given her obesity and persistent atrial fib I have  recommended she consider sleep study. She will let our office know if she wishes to pursue. 2. Chronic combined CHF/presumed NICM - appears euvolemic. Lasix was listed BID but she's now taking only once daily most days. Will update BMET today to help guide definitive input on Lasix, spironolactone and potassium dose. Note prior K was 4.9. Reviewed 2g sodium restriction, 2L fluid restriction, daily weights with patient. 3. Abnormal thyroid function - recheck TSH and free T4 today. 4. Renal insufficiency, suspect CKD II - repeat BMET today.  Disposition: F/u with Dr. Meda Coffee in 6 months.   Medication Adjustments/Labs and Tests Ordered: Current medicines are reviewed at length with the patient today.  Concerns regarding medicines are outlined above. Medication changes, Labs and Tests ordered today are summarized above and listed in the Patient Instructions accessible in Encounters.   Signed, Charlie Pitter, PA-C  07/29/2017 2:41 PM    [facility] [address], [place], [place]  [address] Phone: [phone]; Fax: [phone]

## 2017-07-29 ENCOUNTER — Encounter: Payer: Self-pay | Admitting: Physician Assistant

## 2017-07-29 ENCOUNTER — Ambulatory Visit (INDEPENDENT_AMBULATORY_CARE_PROVIDER_SITE_OTHER): Payer: Medicare Other | Admitting: Physician Assistant

## 2017-07-29 ENCOUNTER — Other Ambulatory Visit: Payer: Self-pay | Admitting: Physician Assistant

## 2017-07-29 VITALS — BP 124/76 | HR 61 | Ht 61.0 in | Wt 214.8 lb

## 2017-07-29 DIAGNOSIS — N182 Chronic kidney disease, stage 2 (mild): Secondary | ICD-10-CM | POA: Diagnosis not present

## 2017-07-29 DIAGNOSIS — R946 Abnormal results of thyroid function studies: Secondary | ICD-10-CM

## 2017-07-29 DIAGNOSIS — I481 Persistent atrial fibrillation: Secondary | ICD-10-CM

## 2017-07-29 DIAGNOSIS — I5042 Chronic combined systolic (congestive) and diastolic (congestive) heart failure: Secondary | ICD-10-CM | POA: Diagnosis not present

## 2017-07-29 DIAGNOSIS — I4819 Other persistent atrial fibrillation: Secondary | ICD-10-CM

## 2017-07-29 MED ORDER — RIVAROXABAN 20 MG PO TABS: 20.0000 mg | ORAL_TABLET | Freq: Every day | ORAL | 11 refills | Status: DC

## 2017-07-29 NOTE — Patient Instructions (Signed)
Medication Instructions:  Your physician recommends that you continue on your current medications as directed. Please refer to the Current Medication list given to you today.   Labwork: TODAY:  BMET, CBC, TSH, & FREE T4  Testing/Procedures: None ordere  Follow-Up: Your physician wants you to follow-up in: [place] DR. Johann Capers will receive a reminder letter in the mail two months in advance. If you don't receive a letter, please call our office to schedule the follow-up appointment.  Any Other Special Instructions Will Be Listed Below (If Applicable). Continue the Xarelto.  But ask your insurance company about Eliquis to see if it would be cheaper for you.  If you need a refill on your cardiac medications before your next appointment, please call your pharmacy.

## 2017-07-30 LAB — BASIC METABOLIC PANEL
BUN/Creatinine Ratio: 14 (ref 12–28)
BUN: 17 mg/dL (ref 8–27)
CO2: 22 mmol/L (ref 20–29)
CREATININE: 1.25 mg/dL — AB (ref 0.57–1.00)
Calcium: 9.5 mg/dL (ref 8.7–10.3)
Chloride: 101 mmol/L (ref 96–106)
GFR calc Af Amer: 50 mL/min/{1.73_m2} — ABNORMAL LOW (ref >59)
GFR, EST NON AFRICAN AMERICAN: 43 mL/min/{1.73_m2} — AB (ref >59)
GLUCOSE: 86 mg/dL (ref 65–99)
Potassium: 5 mmol/L (ref 3.5–5.2)
Sodium: 141 mmol/L (ref 134–144)

## 2017-07-30 LAB — CBC
HEMATOCRIT: 49.3 % — AB (ref 34.0–46.6)
HEMOGLOBIN: 16.8 g/dL — AB (ref 11.1–15.9)
MCH: 32.2 pg (ref 26.6–33.0)
MCHC: 34.1 g/dL (ref 31.5–35.7)
MCV: 94 fL (ref 79–97)
Platelets: 262 10*3/uL (ref 150–379)
RBC: 5.22 x10E6/uL (ref 3.77–5.28)
RDW: 14 % (ref 12.3–15.4)
WBC: 6.4 10*3/uL (ref 3.4–10.8)

## 2017-07-30 LAB — T4, FREE: FREE T4: 1.15 ng/dL (ref 0.82–1.77)

## 2017-07-30 LAB — TSH: TSH: 3.98 u[IU]/mL (ref 0.450–4.500)

## 2017-07-31 ENCOUNTER — Telehealth: Payer: Self-pay | Admitting: *Deleted

## 2017-07-31 MED ORDER — RIVAROXABAN 20 MG PO TABS: 20.0000 mg | ORAL_TABLET | Freq: Every day | ORAL | 11 refills | Status: DC

## 2017-07-31 NOTE — Telephone Encounter (Signed)
-----   Message from Rodman Key, RN sent at 07/31/2017  9:00 AM EST ----- This is Dr. Francesca Oman patient, seen 07/29/17 by Melina Copa PA-C

## 2017-09-29 ENCOUNTER — Telehealth: Payer: Self-pay

## 2017-09-29 NOTE — Telephone Encounter (Signed)
PLEASE NOTE: All timestamps contained within this report are represented as [place] Standard Time. CONFIDENTIALTY NOTICE: This fax transmission is intended only for the addressee. It contains information that is legally privileged, confidential or otherwise protected from use or disclosure. If you are not the intended recipient, you are strictly prohibited from reviewing, disclosing, copying using or disseminating any of this information or taking any action in reliance on or regarding this information. If you have received this fax in error, please notify us immediately by telephone so that we can arrange for its return to [place]. Phone: [phone], Toll-Free: [phone], Fax: [phone] Page: 1 of 2 Call Id: [id number] [place] Patient Name: Natasha Dickson Gender: Female DOB: 1946-02-02 Age: 72 Y 47 M 11 D Return Phone Number: [phone] (Primary) Address: City/State/Zip: [place] [address] Client [place] Night - Client Client Site [place] Physician Renford Dills - MD Contact Type Call Who Is Calling Patient / Member / Family / Caregiver Call Type Triage / Clinical Relationship To Patient Self Return Phone Number [phone] (Primary) Chief Complaint Back Pain - General Reason for Call Symptomatic / Request for [place] states she is having back pain . Can she have something called in for pain ? Translation No Nurse Assessment Nurse: Mills-Hernandez, RN, Izora Gala Date/Time (Eastern Time): 09/27/2017 1:25:57 PM Confirm and document reason for call. If symptomatic, describe symptoms. ---Caller states she is having back pain . Can she have something called in for pain ? She had Oxycodone but is out. Also ran out of her flexeril. Does the patient have any new or worsening symptoms? ---Yes Will a triage  be completed? ---Yes Related visit to physician within the last 2 weeks? ---No Does the PT have any chronic conditions? (i.e. diabetes, asthma, etc.) ---Yes List chronic conditions. ---A-FIB, HTN Is this a behavioral health or substance abuse call? ---No Guidelines Guideline Title Affirmed Question Affirmed Notes Nurse Date/Time (Eastern Time) Back Pain Weakness of a leg or foot (e.g., unable to bear weight, dragging foot) Mills-Hernandez, RN, Izora Gala 09/27/2017 1:28:02 PM Disp. Time Eilene Ghazi Time) Disposition Final User 09/27/2017 1:35:23 PM Go to ED Now (or PCP triage) Yes Mills-Hernandez, RN, Cindee Salt Disagree/Comply Disagree Caller Understands Yes PLEASE NOTE: All timestamps contained within this report are represented as [place] Standard Time. CONFIDENTIALTY NOTICE: This fax transmission is intended only for the addressee. It contains information that is legally privileged, confidential or otherwise protected from use or disclosure. If you are not the intended recipient, you are strictly prohibited from reviewing, disclosing, copying using or disseminating any of this information or taking any action in reliance on or regarding this information. If you have received this fax in error, please notify us immediately by telephone so that we can arrange for its return to [place]. Phone: [phone], Toll-Free: [phone], Fax: [phone] Page: 2 of 2 Call Id: [id number] PreDisposition [place] Advice Given Per Guideline GO TO ED NOW (OR PCP TRIAGE): DRIVING: Another adult should drive. CARE ADVICE given per Back Pain (Adult) guideline. Comments User: Haroldine Laws, RN Date/Time Eilene Ghazi Time): 09/27/2017 1:35:05 PM Caller states that she is not going to go to the ED, she will wait until Monday and phone her doctor. She feels that if she could have something stronger for her back pain that she could work through the weakness in her foot. Referrals GO TO FACILITY  REFUSED

## 2017-09-29 NOTE — Telephone Encounter (Signed)
appt has been scheduled for 4/25

## 2017-09-29 NOTE — Telephone Encounter (Signed)
Pt last saw Dr Damita Dunnings 03/14/16; unable to reach pt at any contact #.

## 2017-10-02 ENCOUNTER — Ambulatory Visit (INDEPENDENT_AMBULATORY_CARE_PROVIDER_SITE_OTHER): Payer: Medicare Other | Admitting: Family Medicine

## 2017-10-02 ENCOUNTER — Encounter: Payer: Self-pay | Admitting: Family Medicine

## 2017-10-02 DIAGNOSIS — M5430 Sciatica, unspecified side: Secondary | ICD-10-CM | POA: Diagnosis not present

## 2017-10-02 MED ORDER — CYCLOBENZAPRINE HCL 10 MG PO TABS: 5.0000 mg | ORAL_TABLET | Freq: Three times a day (TID) | ORAL | 0 refills | Status: DC | PRN

## 2017-10-02 MED ORDER — TRAMADOL HCL 50 MG PO TABS: 50.0000 mg | ORAL_TABLET | Freq: Three times a day (TID) | ORAL | 0 refills | Status: DC | PRN

## 2017-10-02 NOTE — Progress Notes (Signed)
This weekend she was weeding her flower beds, stooping over.  She had trouble standing back up, was able to get back in the house.  Sig pain for about 2 days.  She had leftover flexeril and used one with some relief.  She was nearly running out of medicine.   L lower back pain.  No lateral thigh pain but she has sciatica down the posterior L leg.  She has some L foot numbness.  No R sided numbness.  No falls.  No B/B sx.  No fevers.  No chills.  No trauma o/w.    Meds, vitals, and allergies reviewed.   ROS: Per HPI unless specifically indicated in ROS section   nad ncat IRR ctab abd soft, not ttp Sensation and motor function intact BLE  SLR neg.  L lower back sore at baseline but not ttp.  No rash.

## 2017-10-02 NOTE — Patient Instructions (Addendum)
Likely sciatica.  Use flexeril and heat on your back if you have back spasms.  You can also use ice.  See which feels better.  Take tramadol if needed for pain.   Gently stretch you back and do knee-to-chest stretches in the bed.   Update [place] as needed.  Take care.  Glad to see you.  Schedule a yearly physical when possible.

## 2017-10-03 DIAGNOSIS — M5430 Sciatica, unspecified side: Secondary | ICD-10-CM | POA: Insufficient documentation

## 2017-10-03 NOTE — Assessment & Plan Note (Signed)
Likely sciatica, improved some in the meantime.   Use flexeril and heat on back if you having spasms.  Can also use ice, whichever feels better.  Take tramadol if needed for pain.  Sedation cautions d/w pt.   Gently stretch and do knee-to-chest stretches in the bed.   Update [place] as needed.  She agrees.

## 2017-11-06 ENCOUNTER — Other Ambulatory Visit: Payer: Self-pay | Admitting: Family Medicine

## 2017-11-06 DIAGNOSIS — I428 Other cardiomyopathies: Secondary | ICD-10-CM

## 2017-11-06 DIAGNOSIS — M899 Disorder of bone, unspecified: Secondary | ICD-10-CM

## 2017-11-06 DIAGNOSIS — S72002S Fracture of unspecified part of neck of left femur, sequela: Secondary | ICD-10-CM

## 2017-11-06 DIAGNOSIS — E7800 Pure hypercholesterolemia, unspecified: Secondary | ICD-10-CM

## 2017-11-07 ENCOUNTER — Other Ambulatory Visit: Payer: Medicare Other

## 2017-11-11 ENCOUNTER — Encounter: Payer: Medicare Other | Admitting: Family Medicine

## 2017-12-20 ENCOUNTER — Other Ambulatory Visit: Payer: Self-pay | Admitting: Cardiology

## 2017-12-20 DIAGNOSIS — I5042 Chronic combined systolic (congestive) and diastolic (congestive) heart failure: Secondary | ICD-10-CM

## 2017-12-20 DIAGNOSIS — E7849 Other hyperlipidemia: Secondary | ICD-10-CM

## 2017-12-20 DIAGNOSIS — Z7901 Long term (current) use of anticoagulants: Secondary | ICD-10-CM

## 2017-12-20 DIAGNOSIS — I10 Essential (primary) hypertension: Secondary | ICD-10-CM

## 2018-01-25 ENCOUNTER — Other Ambulatory Visit: Payer: Self-pay | Admitting: Cardiology

## 2018-01-25 DIAGNOSIS — I482 Chronic atrial fibrillation, unspecified: Secondary | ICD-10-CM

## 2018-01-25 DIAGNOSIS — I5041 Acute combined systolic (congestive) and diastolic (congestive) heart failure: Secondary | ICD-10-CM

## 2018-01-25 DIAGNOSIS — I428 Other cardiomyopathies: Secondary | ICD-10-CM

## 2018-01-25 DIAGNOSIS — R0602 Shortness of breath: Secondary | ICD-10-CM

## 2018-01-27 ENCOUNTER — Telehealth: Payer: Self-pay | Admitting: Cardiology

## 2018-01-27 NOTE — Telephone Encounter (Signed)
New Message        *STAT* If patient is at the pharmacy, call can be transferred to refill team.   1. Which medications need to be refilled? (please list name of each medication and dose if known) Metoprolol ER 100  2. Which pharmacy/location (including street and city if local pharmacy) is medication to be sent to? [facility] ([place] [place] - [address]  3. Do they need a 30 day or 90 day supply? [place]

## 2018-01-27 NOTE — Telephone Encounter (Signed)
Outpatient Medication Detail    Disp Refills Start End   metoprolol succinate (TOPROL-XL) 100 MG 24 hr tablet 90 tablet 1 01/27/2018    Sig: TAKE 1 TABLET BY MOUTH ONCE DAILY WITH MEALS OR IMMEDIATELY AFTER A MEAL   Sent to pharmacy as: metoprolol succinate (TOPROL-XL) 100 MG 24 hr tablet   E-Prescribing Status: Receipt confirmed by pharmacy (01/27/2018 11:13 AM EDT)   Associated Diagnoses   Acute combined systolic and diastolic heart failure (HCC)     Chronic atrial fibrillation (HCC)     SOB (shortness of breath)     Nonischemic cardiomyopathy [facility])     Pharmacy   [facility] ([place]), [place] [address]

## 2018-04-15 ENCOUNTER — Telehealth: Payer: Self-pay

## 2018-04-15 NOTE — Telephone Encounter (Signed)
For 3 weeks pt has had prod cough with white phlegm; wheezing on and off; no SOB, fever and no CP.pt does not appear in any distress. Pt has had on and off sharp pain upper rt rib cage that does not last more than 3 mins. Pt had S/T when first got sick 3 wks ago but no S/T now. Pt scheduled appt with Dr Damita Dunnings 04/16/18 at 11:45. If pt condition changes or worsens prior to appt pt will go to [facility] or ED. Pt voiced understanding.

## 2018-04-15 NOTE — Telephone Encounter (Signed)
Thanks. Noted.

## 2018-04-16 ENCOUNTER — Encounter: Payer: Self-pay | Admitting: Family Medicine

## 2018-04-16 ENCOUNTER — Ambulatory Visit (INDEPENDENT_AMBULATORY_CARE_PROVIDER_SITE_OTHER): Payer: Medicare Other | Admitting: Family Medicine

## 2018-04-16 DIAGNOSIS — R05 Cough: Secondary | ICD-10-CM | POA: Diagnosis not present

## 2018-04-16 DIAGNOSIS — R059 Cough, unspecified: Secondary | ICD-10-CM

## 2018-04-16 MED ORDER — AZITHROMYCIN 250 MG PO TABS: ORAL_TABLET | ORAL | 0 refills | Status: DC

## 2018-04-16 MED ORDER — BENZONATATE 200 MG PO CAPS: 200.0000 mg | ORAL_CAPSULE | Freq: Three times a day (TID) | ORAL | 1 refills | Status: DC | PRN

## 2018-04-16 NOTE — Patient Instructions (Signed)
Get well and then get a flu shot.  Use tessalon for the cough.  If you feel worse or have discolored sputum then start zithromax.  Take care.  Glad to see you.

## 2018-04-16 NOTE — Progress Notes (Signed)
Cough.  Started about 1 month ago.  Deep cough.  Worse in the AM, some midday.  Some at night.  More cough with activity.  Some sputum.  No vomiting.  No diarrhea.  She had some wheeze last week, not now.  She thought she may be some better than last week.  No fevers in the last two weeks.  Her R chest wall is sore with reaching up or reaching out- episodic sx, not daily, usually positional.  Some fatigue.    Meds, vitals, and allergies reviewed.   ROS: Per HPI unless specifically indicated in ROS section   nad ncat Nasal exam slightly stuffy Mucous membranes moist, oropharynx without exudates Neck supple, no lymphadenopathy IRR ctab Chest wall not ttp.   Skin well perfused. Extremities without edema.

## 2018-04-19 NOTE — Assessment & Plan Note (Signed)
Nontoxic.  Okay for outpatient follow-up.  She was asking about a flu shot.  Likely reasonable to defer this for now until she is well and then get vaccinated.  In the meantime, use tessalon for the cough.  I suspect she has a resolving viral issue but if she begins to feel worse again in the meantime or has discolored sputum then start Zithromax.  Hold prescription for now.  Update [place] as needed.  She agrees.

## 2018-04-30 ENCOUNTER — Telehealth: Payer: Self-pay

## 2018-04-30 NOTE — Telephone Encounter (Signed)
Left message for patient to call back and schedule AWV with Luevenia Maxin and Physical visit with Dr Damita Dunnings. Last one was on 03/05/16.

## 2018-05-13 DIAGNOSIS — Z23 Encounter for immunization: Secondary | ICD-10-CM | POA: Diagnosis not present

## 2018-05-22 DIAGNOSIS — Z853 Personal history of malignant neoplasm of breast: Secondary | ICD-10-CM | POA: Diagnosis not present

## 2018-05-22 DIAGNOSIS — Z1231 Encounter for screening mammogram for malignant neoplasm of breast: Secondary | ICD-10-CM | POA: Diagnosis not present

## 2018-05-22 LAB — HM MAMMOGRAPHY

## 2018-05-29 ENCOUNTER — Encounter: Payer: Self-pay | Admitting: Cardiology

## 2018-05-29 ENCOUNTER — Encounter

## 2018-05-29 ENCOUNTER — Ambulatory Visit (INDEPENDENT_AMBULATORY_CARE_PROVIDER_SITE_OTHER): Payer: Medicare Other | Admitting: Cardiology

## 2018-05-29 ENCOUNTER — Encounter: Payer: Self-pay | Admitting: Family Medicine

## 2018-05-29 DIAGNOSIS — I4891 Unspecified atrial fibrillation: Secondary | ICD-10-CM

## 2018-05-29 DIAGNOSIS — I10 Essential (primary) hypertension: Secondary | ICD-10-CM

## 2018-05-29 DIAGNOSIS — Z7901 Long term (current) use of anticoagulants: Secondary | ICD-10-CM | POA: Diagnosis not present

## 2018-05-29 DIAGNOSIS — E782 Mixed hyperlipidemia: Secondary | ICD-10-CM

## 2018-05-29 DIAGNOSIS — I5042 Chronic combined systolic (congestive) and diastolic (congestive) heart failure: Secondary | ICD-10-CM | POA: Diagnosis not present

## 2018-05-29 DIAGNOSIS — I482 Chronic atrial fibrillation, unspecified: Secondary | ICD-10-CM | POA: Diagnosis not present

## 2018-05-29 MED ORDER — DILTIAZEM HCL ER COATED BEADS 120 MG PO CP24: 120.0000 mg | ORAL_CAPSULE | ORAL | 3 refills | Status: DC

## 2018-05-29 NOTE — Progress Notes (Signed)
Cardiology Office Note    Date:  05/29/2018   ID:  Natasha Dickson, DOB 1945/11/24, MRN [id number]  PCP:  Tonia Ghent, MD  Cardiologist:  Ena Dawley, MD   No chief complaint on file.  History of Present Illness:  Natasha Dickson is a 72 y.o. female admitted in  2014 with combined systolic and diastolic CHF,  and A-fib with RVR. Her original echocardiogram showed LVEF 30-35% with diffuse hypokinesis and her stress test was negative for ischemia, her cardiomyopathy was believed to be secondary to tachycardia associated with atrial fibrillation. She originally lost significant amount of weight was also diuresed, LVEF improved to normal 50-55% in 2015 but then decreased to 45-50% in 2016. She goes through things with her weight up and down 2030 pounds, she is not very compliant with healthy diet and drinks a lot of soda. Her weight has been as low as 170 pounds and as high as 230 pounds. She was cardioverted twice but went back to atrial fibrillation so currently we are just aiming for rate controlled.   04/03/2017 - two months follow-up, at the last visit she was up 20 pounds and Lasix was increased to 40 mg by mouth twice a day, she has cut down on her sugary drinks and Saturday. Today she states that she feels slightly better however she has persistent mild lower extremity edema and occasional paroxysmal nocturnal dyspnea. 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 She doesn't exercise. She's been compliant with her medication and has no bleeding.  May 29, 2018, the patient is coming after a year, she has been doing well, denies any chest pain or shortness of breath, no orthopnea, paroximal nocturnal dyspnea and only occasional lower extremity edema.  Takes Lasix as needed, she denies any bleeding.  Past Medical History:  Diagnosis Date  . Breast cancer ([place]) 1999   Stage I in left breast  . Chronic combined systolic and diastolic CHF (congestive heart  failure) ([place])    a. 02/2013 Echo: EF 30-35%, diff HK, biatrial enlargement. b. 2D echo 07/2014 showed mildly dilated LV, EF 45-50%, severely dilated left atrium and moderately dilated right atrium.  . CKD (chronic kidney disease), stage II   . Closed fracture of unspecified part of neck of femur 2011   Right.  Soreness when standing. (Dr. Durward Fortes)  . Nonischemic cardiomyopathy ([place])    a. 02/2013 Lexi CL: No ischemia, prob attenuation vs scar, EF 40%-->Med Rx.  . Persistent atrial fibrillation    a. 02/2013: s/p TEE and attempted cardioversion x 2-->failed-->rate controlled with bb/digoxin, Xarelto initiated.    Past Surgical History:  Procedure Laterality Date  . CARDIOVERSION N/A 02/18/2013   Procedure: CARDIOVERSION;  Surgeon: Jolaine Artist, MD;  [facility] ENDOSCOPY;  Service: Cardiovascular;  Laterality: N/A;  . CARDIOVERSION N/A 02/22/2013   Procedure: CARDIOVERSION;  Surgeon: Fay Records, MD;  Location: [place];  Service: Cardiovascular;  Laterality: N/A;  . MASTECTOMY  1999   Left breast with saline implant.  Marland Kitchen PARTIAL HIP ARTHROPLASTY Right   . TEE WITHOUT CARDIOVERSION N/A 02/18/2013   Procedure: TRANSESOPHAGEAL ECHOCARDIOGRAM (TEE);  Surgeon: Jolaine Artist, MD;  Location: [facility] ENDOSCOPY;  Service: Cardiovascular;  Laterality: N/A;  . tubal ligation    . TUBAL LIGATION      Current Medications: Outpatient Medications Prior to Visit  Medication Sig Dispense Refill  . furosemide (LASIX) 40 MG tablet Take 40 mg by mouth daily.    Marland Kitchen  metoprolol succinate (TOPROL-XL) 100 MG 24 hr tablet TAKE 1 TABLET BY MOUTH ONCE DAILY WITH MEALS OR  IMMEDIATELY  AFTER  A  MEAL 90 tablet 1  . nitroGLYCERIN (NITROSTAT) 0.4 MG SL tablet DISSOLVE ONE TABLET UNDER THE TONGUE EVERY 5 MINUTES AS NEEDED FOR CHEST PAIN.  DO NOT EXCEED A TOTAL OF 3 DOSES IN 15 MINUTES 75 tablet 1  . rivaroxaban (XARELTO) 20 MG TABS tablet Take 1 tablet (20 mg total) by mouth daily with supper. 30 tablet 11  .  spironolactone (ALDACTONE) 25 MG tablet TAKE 1 TABLET BY MOUTH ONCE DAILY 90 tablet 2  . traMADol (ULTRAM) 50 MG tablet Take 1 tablet (50 mg total) by mouth every 8 (eight) hours as needed. 15 tablet 0  . diltiazem (CARDIZEM CD) 120 MG 24 hr capsule Take 1 capsule (120 mg total) by mouth every morning. 90 capsule 3  . azithromycin (ZITHROMAX) 250 MG tablet 2 tabs a day for 1 day and then 1 a day for 4 days. (Patient not taking: Reported on 05/29/2018) 6 each 0  . benzonatate (TESSALON) 200 MG capsule Take 1 capsule (200 mg total) by mouth 3 (three) times daily as needed. (Patient not taking: Reported on 05/29/2018) 30 capsule 1  . cyclobenzaprine (FLEXERIL) 10 MG tablet Take 0.5-1 tablets (5-10 mg total) by mouth 3 (three) times daily as needed for muscle spasms. (Patient not taking: Reported on 05/29/2018) 30 tablet 0   No facility-administered medications prior to visit.      Allergies:   Patient has no known allergies.   Social History   Socioeconomic History  . Marital status: Married    Spouse name: Not on file  . Number of children: Not on file  . Years of education: Not on file  . Highest education level: Not on file  Occupational History  . Occupation: Surveyor, quantity: Public house manager  Social Needs  . Financial resource strain: Not on file  . Food insecurity:    Worry: Not on file    Inability: Not on file  . Transportation needs:    Medical: Not on file    Non-medical: Not on file  Tobacco Use  . Smoking status: Never Smoker  . Smokeless tobacco: Never Used  Substance and Sexual Activity  . Alcohol use: Yes    Alcohol/week: 0.0 standard drinks    Comment: RARE  . Drug use: No  . Sexual activity: Never  Lifestyle  . Physical activity:    Days per week: Not on file    Minutes per session: Not on file  . Stress: Not on file  Relationships  . Social connections:    Talks on phone: Not on file    Gets together: Not on file    Attends religious service: Not on  file    Active member of club or organization: Not on file    Attends meetings of clubs or organizations: Not on file    Relationship status: Not on file  Other Topics Concern  . Not on file  Social History Narrative   High School grad.   Married 40+ years   Scientist, water quality at [employer]    Family History:  The patient's family history includes COPD in her mother; Colon cancer in her father; Congenital heart disease in her son; Heart attack in her father; Heart disease in her brother and father; Hypertension in her father.   ROS:   Please see the history of present illness.  ROS All other systems reviewed and are negative.  PHYSICAL EXAM:   VS:  BP 107/82   Pulse 86   Ht 5\' 1"  (1.549 m)   Wt 213 lb 6.4 oz (96.8 kg)   SpO2 96%   BMI 40.32 kg/m    GEN: Well nourished, well developed, in no acute distress  HEENT: normal  Neck: no JVD, carotid bruits, or masses Cardiac: iRRR; no murmurs, rubs, or gallops,no edema  Respiratory:  clear to auscultation bilaterally, normal work of breathing GI: soft, nontender, nondistended, + BS MS: no deformity or atrophy  Skin: warm and dry, no rash Neuro:  Alert and Oriented x 3, Strength and sensation are intact Psych: euthymic mood, full affect  Wt Readings from Last 3 Encounters:  05/29/18 213 lb 6.4 oz (96.8 kg)  04/16/18 211 lb 4 oz (95.8 kg)  10/02/17 211 lb (95.7 kg)    Studies/Labs Reviewed:   EKG:  EKG is ordered today.  The ekg ordered today demonstrates Atrial fibrillation rapid ventricular response, 108 bpm, nonspecific T wave abnormalities. HR has increased since the last visit. This was personally reviewed.  Recent Labs: 07/29/2017: BUN CANCELED; Creatinine, Ser CANCELED; Hemoglobin CANCELED; Platelets CANCELED; Potassium CANCELED; Sodium CANCELED; TSH CANCELED   Lipid Panel    Component Value Date/Time   CHOL 198 03/11/2016 0911   TRIG 129 03/11/2016 0911   HDL 47 03/11/2016 0911   CHOLHDL 4.2 03/11/2016 0911   VLDL 26  03/11/2016 0911   LDLCALC 125 03/11/2016 0911   LDLDIRECT 137.1 03/29/2011 1016    Additional studies/ records that were reviewed today include:   TTE 08/05/2014 - Left ventricle: The cavity size was mildly dilated. Wall thickness was normal. Systolic function was mildly reduced. The estimated ejection fraction was in the range of 45% to 50%. - Left atrium: The atrium was severely dilated. - Right atrium: The atrium was moderately dilated. - Atrial septum: No defect or patent foramen ovale was identified.    ASSESSMENT:    1. Mixed hyperlipidemia   2. Chronic combined systolic and diastolic CHF, NYHA class 2 ([place])   3. Chronic anticoagulation   4. Essential hypertension   5. Atrial fibrillation, unspecified type ([place])   6. Chronic atrial fibrillation     PLAN:  In order of problems listed above:  1. Acute on chronic combined systolic and diastolic CHF, non-ischemic, tachycardia induced - LVEF now back to normal 30--> 50-55% in march 2015 - 45-50% in 08/2014. She appears euvolemic and is careful with the salt intake, will continue the same regimen of Aldactone and Lasix only as needed.  2. Afib RVR: S/p TEE and 2 attempted DCCV on 02/18/13 and 02/22/13. She is asymptomatic, heart rate is well controlled, will continue Xarelto, she has no bleeding.  3.  Hypertension - controlled, change regimen as above  4. Obesity - advised to exercise- walk daily.  Will obtain CMP, BNP, CBC and TSH today.  Medication Adjustments/Labs and Tests Ordered: Current medicines are reviewed at length with the patient today.  Concerns regarding medicines are outlined above.  Medication changes, Labs and Tests ordered today are listed in the Patient Instructions below. Patient Instructions  Medication Instructions:   Your physician recommends that you continue on your current medications as directed. Please refer to the Current Medication list given to you today. ] If you need a refill on your  cardiac medications before your next appointment, please call your pharmacy.     Lab work:  TODAY--CMET, CBC W  DIFF, AND TSH  If you have labs (blood work) drawn today and your tests are completely normal, you will receive your results only by: Marland Kitchen MyChart Message (if you have MyChart) OR . A paper copy in the mail If you have any lab test that is abnormal or we need to change your treatment, we will call you to review the results.    Follow-Up: At [facility], you and your health needs are our priority.  As part of our continuing mission to provide you with exceptional heart care, we have created designated Provider Care Teams.  These Care Teams include your primary Cardiologist (physician) and Advanced Practice Providers (APPs -  Physician Assistants and Nurse Practitioners) who all work together to provide you with the care you need, when you need it. You will need a follow up appointment in 6 months.  Please call our office 2 months in advance to schedule this appointment.  You may see Ena Dawley, MD or one of the following Advanced Practice Providers on your designated Care Team:   [place], PA-C Melina Copa, PA-C . Ermalinda Barrios, PA-C          Signed, Ena Dawley, MD  05/29/2018 3:52 PM    [facility] [place], [place], [place]  [address] Phone: [phone]; Fax: [phone]

## 2018-05-29 NOTE — Patient Instructions (Signed)
Medication Instructions:   Your physician recommends that you continue on your current medications as directed. Please refer to the Current Medication list given to you today. ] If you need a refill on your cardiac medications before your next appointment, please call your pharmacy.     Lab work:  TODAY--CMET, CBC W DIFF, AND TSH  If you have labs (blood work) drawn today and your tests are completely normal, you will receive your results only by: Marland Kitchen MyChart Message (if you have MyChart) OR . A paper copy in the mail If you have any lab test that is abnormal or we need to change your treatment, we will call you to review the results.    Follow-Up: At [facility], you and your health needs are our priority.  As part of our continuing mission to provide you with exceptional heart care, we have created designated Provider Care Teams.  These Care Teams include your primary Cardiologist (physician) and Advanced Practice Providers (APPs -  Physician Assistants and Nurse Practitioners) who all work together to provide you with the care you need, when you need it. You will need a follow up appointment in 6 months.  Please call our office 2 months in advance to schedule this appointment.  You may see Ena Dawley, MD or one of the following Advanced Practice Providers on your designated Care Team:   [place], PA-C Melina Copa, PA-C . Ermalinda Barrios, PA-C

## 2018-05-30 LAB — COMPREHENSIVE METABOLIC PANEL
ALT: 7 IU/L (ref 0–32)
AST: 15 IU/L (ref 0–40)
Albumin/Globulin Ratio: 1.6 (ref 1.2–2.2)
Albumin: 4.3 g/dL (ref 3.5–4.8)
Alkaline Phosphatase: 82 IU/L (ref 39–117)
BUN/Creatinine Ratio: 17 (ref 12–28)
BUN: 22 mg/dL (ref 8–27)
Bilirubin Total: 0.9 mg/dL (ref 0.0–1.2)
CO2: 21 mmol/L (ref 20–29)
Calcium: 9.4 mg/dL (ref 8.7–10.3)
Chloride: 101 mmol/L (ref 96–106)
Creatinine, Ser: 1.26 mg/dL — ABNORMAL HIGH (ref 0.57–1.00)
GFR calc Af Amer: 49 mL/min/{1.73_m2} — ABNORMAL LOW (ref >59)
GFR calc non Af Amer: 43 mL/min/{1.73_m2} — ABNORMAL LOW (ref >59)
Globulin, Total: 2.7 g/dL (ref 1.5–4.5)
Glucose: 87 mg/dL (ref 65–99)
Potassium: 4.3 mmol/L (ref 3.5–5.2)
Sodium: 141 mmol/L (ref 134–144)
Total Protein: 7 g/dL (ref 6.0–8.5)

## 2018-05-30 LAB — CBC WITH DIFFERENTIAL/PLATELET
Basophils Absolute: 0.1 10*3/uL (ref 0.0–0.2)
Basos: 1 %
EOS (ABSOLUTE): 0.2 10*3/uL (ref 0.0–0.4)
Eos: 3 %
Hematocrit: 44.4 % (ref 34.0–46.6)
Hemoglobin: 15.1 g/dL (ref 11.1–15.9)
Immature Grans (Abs): 0.1 10*3/uL (ref 0.0–0.1)
Immature Granulocytes: 1 %
Lymphocytes Absolute: 2.7 10*3/uL (ref 0.7–3.1)
Lymphs: 33 %
MCH: 31.9 pg (ref 26.6–33.0)
MCHC: 34 g/dL (ref 31.5–35.7)
MCV: 94 fL (ref 79–97)
Monocytes Absolute: 1 10*3/uL — ABNORMAL HIGH (ref 0.1–0.9)
Monocytes: 12 %
Neutrophils Absolute: 4.2 10*3/uL (ref 1.4–7.0)
Neutrophils: 50 %
Platelets: 293 10*3/uL (ref 150–450)
RBC: 4.74 x10E6/uL (ref 3.77–5.28)
RDW: 13.6 % (ref 12.3–15.4)
WBC: 8.3 10*3/uL (ref 3.4–10.8)

## 2018-05-30 LAB — TSH: TSH: 3.34 u[IU]/mL (ref 0.450–4.500)

## 2018-08-07 ENCOUNTER — Telehealth: Payer: Self-pay | Admitting: Cardiology

## 2018-08-07 NOTE — Telephone Encounter (Signed)
Spoke with the pt and scheduled her to see Dr Meda Coffee for December 02, 2018 at her 2 pm slot.  Advised the pt to arrive 15 mins prior to that appt.  Pt also stated that she will be due for a refill of her xarelto in one month, and she will call us back when she gets down to a weeks worth. Pt verbalized understanding and agrees with this plan.

## 2018-08-07 NOTE — Telephone Encounter (Signed)
Per pt call - stated second attempt to make and appointment please give her a call back for a June appt.

## 2018-08-23 ENCOUNTER — Other Ambulatory Visit: Payer: Self-pay | Admitting: Cardiology

## 2018-08-23 DIAGNOSIS — I428 Other cardiomyopathies: Secondary | ICD-10-CM

## 2018-08-23 DIAGNOSIS — R0602 Shortness of breath: Secondary | ICD-10-CM

## 2018-08-23 DIAGNOSIS — I482 Chronic atrial fibrillation, unspecified: Secondary | ICD-10-CM

## 2018-08-23 DIAGNOSIS — I5041 Acute combined systolic (congestive) and diastolic (congestive) heart failure: Secondary | ICD-10-CM

## 2018-08-24 ENCOUNTER — Telehealth: Payer: Self-pay | Admitting: Cardiology

## 2018-08-24 NOTE — Telephone Encounter (Signed)
New Message     *STAT* If patient is at the pharmacy, call can be transferred to refill team.   1. Which medications need to be refilled? (please list name of each medication and dose if known) Metoprolol   2. Which pharmacy/location (including street and city if local pharmacy) is medication to be sent to? [facility] on [address]   3. Do they need a 30 day or 90 day supply?  [place]

## 2018-09-23 ENCOUNTER — Telehealth: Payer: Self-pay | Admitting: Cardiology

## 2018-09-23 NOTE — Telephone Encounter (Signed)
Spoke with the pt and informed her that we did not send her anything through her mychart, and I'm unable to find where anyone else in the system sent her a mychart message.  Pt verbalized understanding and gracious for all the assistance provided.

## 2018-09-23 NOTE — Telephone Encounter (Signed)
New Message   Patient would like a nurse to give her a call to go over my chart message she was unable to get.

## 2018-10-22 ENCOUNTER — Other Ambulatory Visit: Payer: Self-pay | Admitting: Physician Assistant

## 2018-10-22 NOTE — Telephone Encounter (Signed)
72yo, 213lbs, Scr 1.26 on 05/29/18, crcl 7ml/min Last OV 05/29/18 Indication afib

## 2018-12-02 ENCOUNTER — Ambulatory Visit: Payer: Medicare Other | Admitting: Cardiology

## 2019-01-22 ENCOUNTER — Other Ambulatory Visit: Payer: Self-pay | Admitting: Cardiology

## 2019-02-02 DIAGNOSIS — Z23 Encounter for immunization: Secondary | ICD-10-CM | POA: Diagnosis not present

## 2019-03-01 ENCOUNTER — Ambulatory Visit (INDEPENDENT_AMBULATORY_CARE_PROVIDER_SITE_OTHER): Payer: Medicare Other | Admitting: Cardiology

## 2019-03-01 ENCOUNTER — Encounter

## 2019-03-01 ENCOUNTER — Encounter: Payer: Self-pay | Admitting: Cardiology

## 2019-03-01 ENCOUNTER — Other Ambulatory Visit: Payer: Self-pay

## 2019-03-01 VITALS — BP 116/68 | HR 86 | Ht 62.0 in | Wt 226.0 lb

## 2019-03-01 DIAGNOSIS — R7989 Other specified abnormal findings of blood chemistry: Secondary | ICD-10-CM | POA: Diagnosis not present

## 2019-03-01 DIAGNOSIS — I4819 Other persistent atrial fibrillation: Secondary | ICD-10-CM

## 2019-03-01 DIAGNOSIS — I5042 Chronic combined systolic (congestive) and diastolic (congestive) heart failure: Secondary | ICD-10-CM | POA: Diagnosis not present

## 2019-03-01 DIAGNOSIS — E782 Mixed hyperlipidemia: Secondary | ICD-10-CM

## 2019-03-01 DIAGNOSIS — I10 Essential (primary) hypertension: Secondary | ICD-10-CM | POA: Diagnosis not present

## 2019-03-01 NOTE — Addendum Note (Signed)
Addended by: Harland German A on: 03/01/2019 03:38 PM   Modules accepted: Orders

## 2019-03-01 NOTE — Patient Instructions (Signed)
Medication Instructions:  Your provider recommends that you continue on your current medications as directed. Please refer to the Current Medication list given to you today.    Labwork: TODAY! CBC, CMET, TSH, BNP  Testing/Procedures: None  Follow-Up: Your provider wants you to follow-up in: 6 months with Dr. Meda Coffee. You will receive a reminder letter in the mail two months in advance. If you don't receive a letter, please call our office to schedule the follow-up appointment.    Any Other Special Instructions Will Be Listed Below (If Applicable).     If you need a refill on your cardiac medications before your next appointment, please call your pharmacy.

## 2019-03-01 NOTE — Progress Notes (Signed)
Cardiology Office Note    Date:  03/01/2019   ID:  Natasha Dickson, DOB 07/22/1945, MRN [id number]  PCP:  Tonia Ghent, MD  Cardiologist:  Ena Dawley, MD   Reason for visit: 1 year follow-up  History of Present Illness:  Natasha Dickson is a 73 y.o. female admitted in  2014 with combined systolic and diastolic CHF,  and A-fib with RVR. Her original echocardiogram showed LVEF 30-35% with diffuse hypokinesis and her stress test was negative for ischemia, her cardiomyopathy was believed to be secondary to tachycardia associated with atrial fibrillation. She originally lost significant amount of weight was also diuresed, LVEF improved to normal 50-55% in 2015 but then decreased to 45-50% in 2016. She goes through things with her weight up and down 2030 pounds, she is not very compliant with healthy diet and drinks a lot of soda. Her weight has been as low as 170 pounds and as high as 230 pounds. She was cardioverted twice but went back to atrial fibrillation so currently we are just aiming for rate controlled.   03/01/2019 -this is 1 year follow-up, the patient states she is doing well, she has retired, she denies any chest pain or shortness of breath, she has no lower extremity edema.  She went back to drinking sodas and has gained 16 pounds since last year.  She denies any orthopnea proximal nocturnal dyspnea or palpitations.  Past Medical History:  Diagnosis Date  . Breast cancer ([place]) 1999   Stage I in left breast  . Chronic combined systolic and diastolic CHF (congestive heart failure) ([place])    a. 02/2013 Echo: EF 30-35%, diff HK, biatrial enlargement. b. 2D echo 07/2014 showed mildly dilated LV, EF 45-50%, severely dilated left atrium and moderately dilated right atrium.  . CKD (chronic kidney disease), stage II   . Closed fracture of unspecified part of neck of femur 2011   Right.  Soreness when standing. (Dr. Durward Fortes)  . Nonischemic cardiomyopathy ([place])    a. 02/2013 Lexi CL: No  ischemia, prob attenuation vs scar, EF 40%-->Med Rx.  . Persistent atrial fibrillation    a. 02/2013: s/p TEE and attempted cardioversion x 2-->failed-->rate controlled with bb/digoxin, Xarelto initiated.    Past Surgical History:  Procedure Laterality Date  . CARDIOVERSION N/A 02/18/2013   Procedure: CARDIOVERSION;  Surgeon: Jolaine Artist, MD;  [facility]COPY;  Service: Cardiovascular;  Laterality: N/A;  . CARDIOVERSION N/A 02/22/2013   Procedure: CARDIOVERSION;  Surgeon: Fay Records, MD;  Location: [place];  Service: Cardiovascular;  Laterality: N/A;  . MASTECTOMY  1999   Left breast with saline implant.  Marland Kitchen PARTIAL HIP ARTHROPLASTY Right   . TEE WITHOUT CARDIOVERSION N/A 02/18/2013   Procedure: TRANSESOPHAGEAL ECHOCARDIOGRAM (TEE);  Surgeon: Jolaine Artist, MD;  Location: [facility] ENDOSCOPY;  Service: Cardiovascular;  Laterality: N/A;  . tubal ligation    . TUBAL LIGATION      Current Medications: Outpatient Medications Prior to Visit  Medication Sig Dispense Refill  . diltiazem (CARDIZEM CD) 120 MG 24 hr capsule Take 1 capsule (120 mg total) by mouth every morning. 90 capsule 3  . furosemide (LASIX) 40 MG tablet Take 40 mg by mouth as needed.    . metoprolol succinate (TOPROL-XL) 100 MG 24 hr tablet TAKE 1 TABLET BY MOUTH ONCE DAILY WITH MEALS OR IMMEDIATELY AFTER A MEAL 90 tablet 1  . nitroGLYCERIN (NITROSTAT) 0.4 MG SL tablet DISSOLVE ONE TABLET UNDER THE TONGUE EVERY 5 MINUTES AS NEEDED  FOR CHEST PAIN.  DO NOT EXCEED A TOTAL OF 3 DOSES IN 15 MINUTES 75 tablet 1  . spironolactone (ALDACTONE) 25 MG tablet Take 1 tablet (25 mg total) by mouth daily. Please keep upcoming appt for future refills. 90 tablet 0  . traMADol (ULTRAM) 50 MG tablet Take 1 tablet (50 mg total) by mouth every 8 (eight) hours as needed. 15 tablet 0  . XARELTO 20 MG TABS tablet Take 1 tablet by mouth once daily 30 tablet 5  . furosemide (LASIX) 40 MG tablet Take 40 mg by mouth daily.     No  facility-administered medications prior to visit.      Allergies:   Patient has no known allergies.   Social History   Socioeconomic History  . Marital status: Married    Spouse name: Not on file  . Number of children: Not on file  . Years of education: Not on file  . Highest education level: Not on file  Occupational History  . Occupation: Surveyor, quantity: Public house manager  Social Needs  . Financial resource strain: Not on file  . Food insecurity    Worry: Not on file    Inability: Not on file  . Transportation needs    Medical: Not on file    Non-medical: Not on file  Tobacco Use  . Smoking status: Never Smoker  . Smokeless tobacco: Never Used  Substance and Sexual Activity  . Alcohol use: Yes    Alcohol/week: 0.0 standard drinks    Comment: RARE  . Drug use: No  . Sexual activity: Never  Lifestyle  . Physical activity    Days per week: Not on file    Minutes per session: Not on file  . Stress: Not on file  Relationships  . Social Herbalist on phone: Not on file    Gets together: Not on file    Attends religious service: Not on file    Active member of club or organization: Not on file    Attends meetings of clubs or organizations: Not on file    Relationship status: Not on file  Other Topics Concern  . Not on file  Social History Narrative   High School grad.   Married 40+ years   Scientist, water quality at [employer]    Family History:  The patient's family history includes COPD in her mother; Colon cancer in her father; Congenital heart disease in her son; Heart attack in her father; Heart disease in her brother and father; Hypertension in her father.   ROS:   Please see the history of present illness.    ROS All other systems reviewed and are negative.  PHYSICAL EXAM:   VS:  BP 116/68   Pulse 86   Ht 5\' 2"  (1.575 m)   Wt 226 lb (102.5 kg)   SpO2 92%   BMI 41.34 kg/m    GEN: Well nourished, well developed, in no acute distress  HEENT: normal   Neck: no JVD, carotid bruits, or masses Cardiac: iRRR; no murmurs, rubs, or gallops,no edema  Respiratory:  clear to auscultation bilaterally, normal work of breathing GI: soft, nontender, nondistended, + BS MS: no deformity or atrophy  Skin: warm and dry, no rash Neuro:  Alert and Oriented x 3, Strength and sensation are intact Psych: euthymic mood, full affect  Wt Readings from Last 3 Encounters:  03/01/19 226 lb (102.5 kg)  05/29/18 213 lb 6.4 oz (96.8 kg)  04/16/18 211  lb 4 oz (95.8 kg)    Studies/Labs Reviewed:   EKG:  EKG is ordered today.  The ekg ordered today demonstrates Atrial fibrillation rapid ventricular response, 108 bpm, nonspecific T wave abnormalities. HR has increased since the last visit. This was personally reviewed.  Recent Labs: 05/29/2018: ALT 7; BUN 22; Creatinine, Ser 1.26; Hemoglobin 15.1; Platelets 293; Potassium 4.3; Sodium 141; TSH 3.340   Lipid Panel    Component Value Date/Time   CHOL 198 03/11/2016 0911   TRIG 129 03/11/2016 0911   HDL 47 03/11/2016 0911   CHOLHDL 4.2 03/11/2016 0911   VLDL 26 03/11/2016 0911   LDLCALC 125 03/11/2016 0911   LDLDIRECT 137.1 03/29/2011 1016    Additional studies/ records that were reviewed today include:   TTE 08/05/2014 - Left ventricle: The cavity size was mildly dilated. Wall thickness was normal. Systolic function was mildly reduced. The estimated ejection fraction was in the range of 45% to 50%. - Left atrium: The atrium was severely dilated. - Right atrium: The atrium was moderately dilated. - Atrial septum: No defect or patent foramen ovale was identified.    ASSESSMENT:    1. Persistent atrial fibrillation   2. Chronic combined systolic and diastolic CHF (congestive heart failure) ([place])   3. Essential hypertension   4. Mixed hyperlipidemia     PLAN:  In order of problems listed above:  1. Chronic combined systolic and diastolic CHF, non-ischemic, tachycardia induced - LVEF now back to  normal 30--> 50-55% in march 2015 - 45-50% in 08/2014. She appears euvolemic, however weight up she is advised to walk regularly in eliminate sodas.  She states she is dependent on them and wants to drink at least 1 a day.   2. Afib RVR: S/p TEE and 2 attempted DCCV on 02/18/13 and 02/22/13. She is asymptomatic, heart rate is well controlled, will continue Xarelto, she has no bleeding.  3.  Hypertension - controlled, change regimen as above  4. Obesity - advised to exercise.  Will obtain CMP, BNP, CBC and TSH today.  Follow-up in 6 months.  Medication Adjustments/Labs and Tests Ordered: Current medicines are reviewed at length with the patient today.  Concerns regarding medicines are outlined above.  Medication changes, Labs and Tests ordered today are listed in the Patient Instructions below. There are no Patient Instructions on file for this visit.   Signed, Ena Dawley, MD  03/01/2019 3:33 PM    [facility] [address], [place], [place]  [address] Phone: [phone]; Fax: [phone]

## 2019-03-02 ENCOUNTER — Telehealth: Payer: Self-pay | Admitting: *Deleted

## 2019-03-02 LAB — CBC WITH DIFFERENTIAL/PLATELET
Basophils Absolute: 0.1 10*3/uL (ref 0.0–0.2)
Basos: 1 %
EOS (ABSOLUTE): 0.2 10*3/uL (ref 0.0–0.4)
Eos: 3 %
Hematocrit: 47.3 % — ABNORMAL HIGH (ref 34.0–46.6)
Hemoglobin: 15.9 g/dL (ref 11.1–15.9)
Immature Grans (Abs): 0.1 10*3/uL (ref 0.0–0.1)
Immature Granulocytes: 1 %
Lymphocytes Absolute: 2 10*3/uL (ref 0.7–3.1)
Lymphs: 29 %
MCH: 31.5 pg (ref 26.6–33.0)
MCHC: 33.6 g/dL (ref 31.5–35.7)
MCV: 94 fL (ref 79–97)
Monocytes Absolute: 0.7 10*3/uL (ref 0.1–0.9)
Monocytes: 10 %
Neutrophils Absolute: 4 10*3/uL (ref 1.4–7.0)
Neutrophils: 56 %
Platelets: 240 10*3/uL (ref 150–450)
RBC: 5.05 x10E6/uL (ref 3.77–5.28)
RDW: 12.9 % (ref 11.7–15.4)
WBC: 7 10*3/uL (ref 3.4–10.8)

## 2019-03-02 LAB — COMPREHENSIVE METABOLIC PANEL
ALT: 9 IU/L (ref 0–32)
AST: 12 IU/L (ref 0–40)
Albumin/Globulin Ratio: 1.4 (ref 1.2–2.2)
Albumin: 4.1 g/dL (ref 3.7–4.7)
Alkaline Phosphatase: 92 IU/L (ref 39–117)
BUN/Creatinine Ratio: 20 (ref 12–28)
BUN: 23 mg/dL (ref 8–27)
Bilirubin Total: 0.5 mg/dL (ref 0.0–1.2)
CO2: 24 mmol/L (ref 20–29)
Calcium: 9.5 mg/dL (ref 8.7–10.3)
Chloride: 104 mmol/L (ref 96–106)
Creatinine, Ser: 1.14 mg/dL — ABNORMAL HIGH (ref 0.57–1.00)
GFR calc Af Amer: 56 mL/min/{1.73_m2} — ABNORMAL LOW (ref >59)
GFR calc non Af Amer: 48 mL/min/{1.73_m2} — ABNORMAL LOW (ref >59)
Globulin, Total: 3 g/dL (ref 1.5–4.5)
Glucose: 79 mg/dL (ref 65–99)
Potassium: 4.8 mmol/L (ref 3.5–5.2)
Sodium: 140 mmol/L (ref 134–144)
Total Protein: 7.1 g/dL (ref 6.0–8.5)

## 2019-03-02 LAB — TSH: TSH: 6.35 u[IU]/mL — ABNORMAL HIGH (ref 0.450–4.500)

## 2019-03-02 LAB — PRO B NATRIURETIC PEPTIDE: NT-Pro BNP: 1319 pg/mL — ABNORMAL HIGH (ref 0–301)

## 2019-03-02 NOTE — Telephone Encounter (Signed)
Called [facility], per Dr Meda Coffee, and added on a free T3 and Free T4.  Spoke with Lab tech and she added those in.  She will fax a form to [place] to fill out and fax back at [phone].

## 2019-03-02 NOTE — Telephone Encounter (Signed)
-----   Message from Dorothy Spark, MD sent at 03/02/2019 11:12 AM EDT ----- please add fT3, fT4

## 2019-03-03 ENCOUNTER — Telehealth: Payer: Self-pay

## 2019-03-03 NOTE — Telephone Encounter (Signed)
The patient has been notified of the result and verbalized understanding.  All questions (if any) were answered. Wilma Flavin, RN 03/03/2019 9:46 AM

## 2019-03-04 ENCOUNTER — Ambulatory Visit: Payer: Medicare Other | Admitting: Cardiology

## 2019-03-10 LAB — SPECIMEN STATUS REPORT

## 2019-03-10 LAB — T3, FREE: T3, Free: 2.9 pg/mL (ref 2.0–4.4)

## 2019-03-10 LAB — T4, FREE: Free T4: 1.21 ng/dL (ref 0.82–1.77)

## 2019-04-21 ENCOUNTER — Other Ambulatory Visit: Payer: Self-pay | Admitting: Cardiology

## 2019-04-21 DIAGNOSIS — I428 Other cardiomyopathies: Secondary | ICD-10-CM

## 2019-04-21 DIAGNOSIS — I5041 Acute combined systolic (congestive) and diastolic (congestive) heart failure: Secondary | ICD-10-CM

## 2019-04-21 DIAGNOSIS — I482 Chronic atrial fibrillation, unspecified: Secondary | ICD-10-CM

## 2019-04-21 DIAGNOSIS — R0602 Shortness of breath: Secondary | ICD-10-CM

## 2019-05-20 ENCOUNTER — Other Ambulatory Visit: Payer: Self-pay | Admitting: Cardiology

## 2019-06-06 ENCOUNTER — Other Ambulatory Visit: Payer: Self-pay | Admitting: Cardiology

## 2019-06-23 ENCOUNTER — Telehealth: Payer: Self-pay | Admitting: Cardiology

## 2019-06-23 NOTE — Telephone Encounter (Signed)
We are recommending the COVID-19 vaccine to all of our patients. Cardiac medications (including blood thinners) should not deter anyone from being vaccinated and there is no need to hold any of those medications prior to vaccine administration.     Currently, there is a hotline to call (active 06/18/19) to schedule vaccination appointments as no walk-ins will be accepted.   Number: [phone]    If you have further questions or concerns about the vaccine process, please visit [email or web address] or contact your primary care physician.  Went over this information with the pt on the phone. Provided resources above to the pt as well. Pt verbalized understanding and gracious for all the assistance provided.

## 2019-06-23 NOTE — Telephone Encounter (Signed)
New Message:       Pt wants to know if it will  Be alright for her to take the COVID Vaccine,  She is on a blood thinner also.k

## 2019-07-21 ENCOUNTER — Other Ambulatory Visit: Payer: Self-pay | Admitting: Cardiology

## 2019-07-21 NOTE — Telephone Encounter (Signed)
Prescription refill request for  Xarelto received.  Last office visit: Meda Coffee 03/01/2019 Scr: 1.14, 03/01/2019 Age: 74 y.o. Weight: 102.5 kg  CrCl: 71 ml/min   Prescription refill sent.

## 2019-08-10 ENCOUNTER — Other Ambulatory Visit: Payer: Self-pay | Admitting: Cardiology

## 2019-08-10 DIAGNOSIS — I10 Essential (primary) hypertension: Secondary | ICD-10-CM

## 2019-08-10 DIAGNOSIS — I5042 Chronic combined systolic (congestive) and diastolic (congestive) heart failure: Secondary | ICD-10-CM

## 2019-08-10 DIAGNOSIS — I4891 Unspecified atrial fibrillation: Secondary | ICD-10-CM

## 2019-08-10 DIAGNOSIS — E782 Mixed hyperlipidemia: Secondary | ICD-10-CM

## 2019-08-10 DIAGNOSIS — I482 Chronic atrial fibrillation, unspecified: Secondary | ICD-10-CM

## 2019-08-10 DIAGNOSIS — Z7901 Long term (current) use of anticoagulants: Secondary | ICD-10-CM

## 2019-09-23 ENCOUNTER — Encounter: Payer: Self-pay | Admitting: Cardiology

## 2019-09-23 ENCOUNTER — Other Ambulatory Visit: Payer: Self-pay

## 2019-09-23 ENCOUNTER — Ambulatory Visit (INDEPENDENT_AMBULATORY_CARE_PROVIDER_SITE_OTHER): Payer: Medicare Other | Admitting: Cardiology

## 2019-09-23 VITALS — BP 118/70 | HR 66 | Ht 62.0 in | Wt 226.8 lb

## 2019-09-23 DIAGNOSIS — I10 Essential (primary) hypertension: Secondary | ICD-10-CM | POA: Diagnosis not present

## 2019-09-23 DIAGNOSIS — I4819 Other persistent atrial fibrillation: Secondary | ICD-10-CM

## 2019-09-23 DIAGNOSIS — I5042 Chronic combined systolic (congestive) and diastolic (congestive) heart failure: Secondary | ICD-10-CM

## 2019-09-23 MED ORDER — SPIRONOLACTONE 25 MG PO TABS: 12.5000 mg | ORAL_TABLET | Freq: Every day | ORAL | 3 refills | Status: DC

## 2019-09-23 MED ORDER — DILTIAZEM HCL ER COATED BEADS 180 MG PO CP24: 180.0000 mg | ORAL_CAPSULE | Freq: Every day | ORAL | 3 refills | Status: DC

## 2019-09-23 NOTE — Patient Instructions (Signed)
Medication Instructions:  Your physician has recommended you make the following change in your medication:  1.) increase Cardizem CD (diltiazem) to 180 mg daily 2.) decrease spironolactone to 12.5 mg daily  *If you need a refill on your cardiac medications before your next appointment, please call your pharmacy*   Lab Work: Today: BMET, BNP, TSH, free T3, free T4  If you have labs (blood work) drawn today and your tests are completely normal, you will receive your results only by: Marland Kitchen MyChart Message (if you have MyChart) OR . A paper copy in the mail If you have any lab test that is abnormal or we need to change your treatment, we will call you to review the results.   Testing/Procedures: none   Follow-Up: At [facility], you and your health needs are our priority.  As part of our continuing mission to provide you with exceptional heart care, we have created designated Provider Care Teams.  These Care Teams include your primary Cardiologist (physician) and Advanced Practice Providers (APPs -  Physician Assistants and Nurse Practitioners) who all work together to provide you with the care you need, when you need it.  Your next appointment:   3 week(s)  The format for your next appointment:   In Person  Provider:   Ena Dawley, MD   Other Instructions

## 2019-09-23 NOTE — Progress Notes (Signed)
Cardiology Office Note    Date:  09/23/2019   ID:  Natasha Dickson, DOB 1946/03/31, MRN [id number]  PCP:  Tonia Ghent, MD  Cardiologist:  Ena Dawley, MD   Reason for visit: 6 months follow-up  History of Present Illness:  Natasha Dickson is a 74 y.o. female admitted in  2014 with combined systolic and diastolic CHF,  and A-fib with RVR. Her original echocardiogram showed LVEF 30-35% with diffuse hypokinesis and her stress test was negative for ischemia, her cardiomyopathy was believed to be secondary to tachycardia associated with atrial fibrillation. She originally lost significant amount of weight was also diuresed, LVEF improved to normal 50-55% in 2015 but then decreased to 45-50% in 2016. She goes through things with her weight up and down 2030 pounds, she is not very compliant with healthy diet and drinks a lot of soda. Her weight has been as low as 170 pounds and as high as 230 pounds. She was cardioverted twice but went back to atrial fibrillation so currently we are just aiming for rate controlled.   03/01/2019 -this is 1 year follow-up, the patient states she is doing well, she has retired, she denies any chest pain or shortness of breath, she has no lower extremity edema.  She went back to drinking sodas and has gained 16 pounds since last year.  She denies any orthopnea proximal nocturnal dyspnea or palpitations.  09/23/2019 -the patient is coming after 6 months, she has noticed worsening dyspnea on exertion, she has to take breaks after doing light work in her garden.  She states she has been very active since she retired and stays at home most of the time.  She has been compliant with her medication and has no side effects denies any bleeding.  She denies any palpitation dizziness or syncope.  Mild lower extremity edema.  She admits that she has been eating a lot of salty food including potato chips.  Past Medical History:  Diagnosis Date  . Breast cancer ([place]) 1999   Stage  I in left breast  . Chronic combined systolic and diastolic CHF (congestive heart failure) ([place])    a. 02/2013 Echo: EF 30-35%, diff HK, biatrial enlargement. b. 2D echo 07/2014 showed mildly dilated LV, EF 45-50%, severely dilated left atrium and moderately dilated right atrium.  . CKD (chronic kidney disease), stage II   . Closed fracture of unspecified part of neck of femur 2011   Right.  Soreness when standing. (Dr. Durward Fortes)  . Nonischemic cardiomyopathy ([place])    a. 02/2013 Lexi CL: No ischemia, prob attenuation vs scar, EF 40%-->Med Rx.  . Persistent atrial fibrillation ([place])    a. 02/2013: s/p TEE and attempted cardioversion x 2-->failed-->rate controlled with bb/digoxin, Xarelto initiated.    Past Surgical History:  Procedure Laterality Date  . CARDIOVERSION N/A 02/18/2013   Procedure: CARDIOVERSION;  Surgeon: Jolaine Artist, MD;  Location: [facility] ENDOSCOPY;  Service: Cardiovascular;  Laterality: N/A;  . CARDIOVERSION N/A 02/22/2013   Procedure: CARDIOVERSION;  Surgeon: Fay Records, MD;  Location: [facility];  Service: Cardiovascular;  Laterality: N/A;  . MASTECTOMY  1999   Left breast with saline implant.  Marland Kitchen PARTIAL HIP ARTHROPLASTY Right   . TEE WITHOUT CARDIOVERSION N/A 02/18/2013   Procedure: TRANSESOPHAGEAL ECHOCARDIOGRAM (TEE);  Surgeon: Jolaine Artist, MD;  Location[facility]COPY;  Service: Cardiovascular;  Laterality: N/A;  . tubal ligation    . TUBAL LIGATION      Current Medications: Outpatient Medications  Prior to Visit  Medication Sig Dispense Refill  . furosemide (LASIX) 40 MG tablet Take 40 mg by mouth as needed.    . metoprolol succinate (TOPROL-XL) 100 MG 24 hr tablet TAKE 1 TABLET BY MOUTH ONCE DAILY WITH MEALS OR  IMMEDIATELY  AFTER  A  MEAL 90 tablet 3  . nitroGLYCERIN (NITROSTAT) 0.4 MG SL tablet DISSOLVE ONE TABLET UNDER THE TONGUE EVERY 5 MINUTES AS NEEDED FOR CHEST PAIN.  DO NOT EXCEED A TOTAL OF 3 DOSES IN 15 MINUTES 75 tablet 1  . traMADol (ULTRAM)  50 MG tablet Take 1 tablet (50 mg total) by mouth every 8 (eight) hours as needed. 15 tablet 0  . XARELTO 20 MG TABS tablet Take 1 tablet by mouth once daily 30 tablet 5  . diltiazem (CARDIZEM CD) 120 MG 24 hr capsule Take 1 capsule by mouth in the morning 90 capsule 3  . spironolactone (ALDACTONE) 25 MG tablet TAKE 1 TABLET BY MOUTH ONCE DAILY(PLEASE KEEP UPCOMING APPT FOR FUTURE REFILLS) 90 tablet 2   No facility-administered medications prior to visit.     Allergies:   Patient has no known allergies.   Social History   Socioeconomic History  . Marital status: Married    Spouse name: Not on file  . Number of children: Not on file  . Years of education: Not on file  . Highest education level: Not on file  Occupational History  . Occupation: Surveyor, quantity: [employer]  Tobacco Use  . Smoking status: Never Smoker  . Smokeless tobacco: Never Used  Substance and Sexual Activity  . Alcohol use: Yes    Alcohol/week: 0.0 standard drinks    Comment: RARE  . Drug use: No  . Sexual activity: Never  Other Topics Concern  . Not on file  Social History Narrative   High School grad.   Married 40+ years   Scientist, water quality at [employer] Strain:   . Difficulty of Paying Living Expenses:   Food Insecurity:   . Worried About Charity fundraiser in the Last Year:   . Arboriculturist in the Last Year:   Transportation Needs:   . Film/video editor (Medical):   Marland Kitchen Lack of Transportation (Non-Medical):   Physical Activity:   . Days of Exercise per Week:   . Minutes of Exercise per Session:   Stress:   . Feeling of Stress :   Social Connections:   . Frequency of Communication with Friends and Family:   . Frequency of Social Gatherings with Friends and Family:   . Attends Religious Services:   . Active Member of Clubs or Organizations:   . Attends Archivist Meetings:   Marland Kitchen Marital Status:     Family History:  The  patient's family history includes COPD in her mother; Colon cancer in her father; Congenital heart disease in her son; Heart attack in her father; Heart disease in her brother and father; Hypertension in her father.   ROS:   Please see the history of present illness.    ROS All other systems reviewed and are negative.  PHYSICAL EXAM:   VS:  BP 118/70   Pulse 66   Ht 5\' 2"  (1.575 m)   Wt 226 lb 12.8 oz (102.9 kg)   SpO2 96%   BMI 41.48 kg/m    GEN: Well nourished, well developed, in no acute distress  HEENT: normal  Neck: no JVD, carotid bruits, or masses Cardiac: iRRR; no murmurs, rubs, or gallops,no edema  Respiratory:  clear to auscultation bilaterally, normal work of breathing GI: soft, nontender, nondistended, + BS MS: no deformity or atrophy  Skin: warm and dry, no rash Neuro:  Alert and Oriented x 3, Strength and sensation are intact Psych: euthymic mood, full affect  Wt Readings from Last 3 Encounters:  09/23/19 226 lb 12.8 oz (102.9 kg)  03/01/19 226 lb (102.5 kg)  05/29/18 213 lb 6.4 oz (96.8 kg)    Studies/Labs Reviewed:   EKG:  EKG is ordered today.  The ekg ordered today demonstrates Atrial fibrillation rapid ventricular response, 108 bpm, nonspecific T wave abnormalities. HR has increased since the last visit. This was personally reviewed.  Recent Labs: 03/01/2019: ALT 9; BUN 23; Creatinine, Ser 1.14; Hemoglobin 15.9; NT-Pro BNP 1,319; Platelets 240; Potassium 4.8; Sodium 140; TSH 6.350   Lipid Panel    Component Value Date/Time   CHOL 198 03/11/2016 0911   TRIG 129 03/11/2016 0911   HDL 47 03/11/2016 0911   CHOLHDL 4.2 03/11/2016 0911   VLDL 26 03/11/2016 0911   LDLCALC 125 03/11/2016 0911   LDLDIRECT 137.1 03/29/2011 1016    Additional studies/ records that were reviewed today include:   TTE 08/05/2014 - Left ventricle: The cavity size was mildly dilated. Wall thickness was normal. Systolic function was mildly reduced. The estimated ejection  fraction was in the range of 45% to 50%. - Left atrium: The atrium was severely dilated. - Right atrium: The atrium was moderately dilated. - Atrial septum: No defect or patent foramen ovale was identified.    ASSESSMENT:    1. Chronic combined systolic and diastolic CHF (congestive heart failure) ([place])   2. Essential hypertension   3. Persistent atrial fibrillation ([facility])     PLAN:  In order of problems listed above:  1. Chronic combined systolic and diastolic CHF, non-ischemic, tachycardia induced - LVEF now back to normal 30--> 50-55% in march 2015 - 45-50% in 08/2014. She has evidence of mild fluid overload with lower extremity edema clear lungs.  She is advised about low-sodium diet and necessity of regular exercise preferably walking.  I will obtain BMP and BNP today, A. fib with RVR might be contributing to her symptoms.   2. Afib RVR: S/p TEE and 2 attempted DCCV on 02/18/13 and 02/22/13.  This has led to CHF in the past, I will increase Cardizem CD from 120 to 180 mg a day.  3.  Hypertension - controlled, because of increasing Cardizem I will have to decrease spironolactone to 12.5 mg daily.  4. Obesity - advised to exercise.  Will obtain CMP, BNP, CBC and TSH, free T3 and free T4 today.  Follow-up in 3 weeks with me..  Medication Adjustments/Labs and Tests Ordered: Current medicines are reviewed at length with the patient today.  Concerns regarding medicines are outlined above.  Medication changes, Labs and Tests ordered today are listed in the Patient Instructions below. Patient Instructions  Medication Instructions:  Your physician has recommended you make the following change in your medication:  1.) increase Cardizem CD (diltiazem) to 180 mg daily 2.) decrease spironolactone to 12.5 mg daily  *If you need a refill on your cardiac medications before your next appointment, please call your pharmacy*   Lab Work: Today: BMET, BNP, TSH, free T3, free T4  If you have  labs (blood work) drawn today and your tests are completely normal, you will receive your  results only by: Marland Kitchen MyChart Message (if you have MyChart) OR . A paper copy in the mail If you have any lab test that is abnormal or we need to change your treatment, we will call you to review the results.   Testing/Procedures: none   Follow-Up: At [facility], you and your health needs are our priority.  As part of our continuing mission to provide you with exceptional heart care, we have created designated Provider Care Teams.  These Care Teams include your primary Cardiologist (physician) and Advanced Practice Providers (APPs -  Physician Assistants and Nurse Practitioners) who all work together to provide you with the care you need, when you need it.  Your next appointment:   3 week(s)  The format for your next appointment:   In Person  Provider:   Ena Dawley, MD   Other Instructions      Signed, Ena Dawley, MD  09/23/2019 10:17 AM    [address], [place], [place]  [id number] Phone: [phone]; Fax: [phone]

## 2019-09-24 LAB — BASIC METABOLIC PANEL
BUN/Creatinine Ratio: 18 (ref 12–28)
BUN: 20 mg/dL (ref 8–27)
CO2: 20 mmol/L (ref 20–29)
Calcium: 9.2 mg/dL (ref 8.7–10.3)
Chloride: 103 mmol/L (ref 96–106)
Creatinine, Ser: 1.12 mg/dL — ABNORMAL HIGH (ref 0.57–1.00)
GFR calc Af Amer: 56 mL/min/{1.73_m2} — ABNORMAL LOW (ref >59)
GFR calc non Af Amer: 49 mL/min/{1.73_m2} — ABNORMAL LOW (ref >59)
Glucose: 95 mg/dL (ref 65–99)
Potassium: 4.7 mmol/L (ref 3.5–5.2)
Sodium: 139 mmol/L (ref 134–144)

## 2019-09-24 LAB — PRO B NATRIURETIC PEPTIDE: NT-Pro BNP: 917 pg/mL — ABNORMAL HIGH (ref 0–301)

## 2019-09-24 LAB — T4, FREE: Free T4: 1.07 ng/dL (ref 0.82–1.77)

## 2019-09-24 LAB — TSH: TSH: 6.55 u[IU]/mL — ABNORMAL HIGH (ref 0.450–4.500)

## 2019-09-24 LAB — T3, FREE: T3, Free: 3 pg/mL (ref 2.0–4.4)

## 2019-10-13 ENCOUNTER — Ambulatory Visit (INDEPENDENT_AMBULATORY_CARE_PROVIDER_SITE_OTHER): Payer: Medicare Other | Admitting: Cardiology

## 2019-10-13 ENCOUNTER — Encounter: Payer: Self-pay | Admitting: Cardiology

## 2019-10-13 ENCOUNTER — Other Ambulatory Visit: Payer: Self-pay

## 2019-10-13 VITALS — BP 118/74 | HR 65 | Ht 62.0 in | Wt 229.6 lb

## 2019-10-13 DIAGNOSIS — I10 Essential (primary) hypertension: Secondary | ICD-10-CM

## 2019-10-13 DIAGNOSIS — I4891 Unspecified atrial fibrillation: Secondary | ICD-10-CM | POA: Diagnosis not present

## 2019-10-13 DIAGNOSIS — N182 Chronic kidney disease, stage 2 (mild): Secondary | ICD-10-CM

## 2019-10-13 DIAGNOSIS — Z7901 Long term (current) use of anticoagulants: Secondary | ICD-10-CM

## 2019-10-13 DIAGNOSIS — I5042 Chronic combined systolic (congestive) and diastolic (congestive) heart failure: Secondary | ICD-10-CM | POA: Diagnosis not present

## 2019-10-13 DIAGNOSIS — E782 Mixed hyperlipidemia: Secondary | ICD-10-CM

## 2019-10-13 MED ORDER — FUROSEMIDE 20 MG PO TABS: 20.0000 mg | ORAL_TABLET | Freq: Every day | ORAL | 2 refills | Status: DC

## 2019-10-13 NOTE — Patient Instructions (Signed)
Medication Instructions:   START TAKING YOUR LASIX (FUROSEMIDE) 20 MG BY MOUTH DAILY  *If you need a refill on your cardiac medications before your next appointment, please call your pharmacy*   Lab Work:  PRIOR TO YOUR 6 MONTH FOLLOW-UP APPOINTMENT WITH DR. NELSON--SCHEDULING WILL ARRANGE THIS CLOSER TO YOUR FOLLOW-UP APPOINTMENT--WE WILL CHECK A CMET, CBC, TSH, PRO-BNP, AND LIPIDS--PLEASE COME FASTING TO THIS LAB APPOINTMENT.  If you have labs (blood work) drawn today and your tests are completely normal, you will receive your results only by: Marland Kitchen MyChart Message (if you have MyChart) OR . A paper copy in the mail If you have any lab test that is abnormal or we need to change your treatment, we will call you to review the results.   Follow-Up: At [facility], you and your health needs are our priority.  As part of our continuing mission to provide you with exceptional heart care, we have created designated Provider Care Teams.  These Care Teams include your primary Cardiologist (physician) and Advanced Practice Providers (APPs -  Physician Assistants and Nurse Practitioners) who all work together to provide you with the care you need, when you need it.  We recommend signing up for the patient portal called "MyChart".  Sign up information is provided on this After Visit Summary.  MyChart is used to connect with patients for Virtual Visits (Telemedicine).  Patients are able to view lab/test results, encounter notes, upcoming appointments, etc.  Non-urgent messages can be sent to your provider as well.   To learn more about what you can do with MyChart, go to [email or web address].    Your next appointment:   6 month(s)--PT WILL NEED LABS PRIOR TO THIS VISIT PER DR. Meda Coffee  The format for your next appointment:   In Person--PT WILL NEED LABS PRIOR TO THIS VISIT PER DR. Meda Coffee  Provider:   Ena Dawley, MD

## 2019-10-13 NOTE — Progress Notes (Signed)
Cardiology Office Note    Date:  10/13/2019   ID:  BERENIZE BARTHA, DOB December 06, 1945, MRN [id number]  PCP:  Tonia Ghent, MD  Cardiologist:  Ena Dawley, MD   Reason for visit: 6 months follow-up  History of Present Illness:  Natasha Dickson is a 74 y.o. female admitted in  2014 with combined systolic and diastolic CHF,  and A-fib with RVR. Her original echocardiogram showed LVEF 30-35% with diffuse hypokinesis and her stress test was negative for ischemia, her cardiomyopathy was believed to be secondary to tachycardia associated with atrial fibrillation. She originally lost significant amount of weight was also diuresed, LVEF improved to normal 50-55% in 2015 but then decreased to 45-50% in 2016. She goes through things with her weight up and down 2030 pounds, she is not very compliant with healthy diet and drinks a lot of soda. Her weight has been as low as 170 pounds and as high as 230 pounds. She was cardioverted twice but went back to atrial fibrillation so currently we are just aiming for rate controlled.   09/23/2019 -the patient is coming after 6 months, she has noticed worsening dyspnea on exertion, she has to take breaks after doing light work in her garden.  She states she has been very active since she retired and stays at home most of the time.  She has been compliant with her medication and has no side effects denies any bleeding.  She denies any palpitation dizziness or syncope.  Mild lower extremity edema.  She admits that she has been eating a lot of salty food including potato chips.  10/13/2019, patient is coming after 4 weeks, she has been feeling better with improved shortness of breath and more energy.  She denies any chest pain, no lower extremity edema only occasional, no dizziness or falls.  She admits to continue drinking sugary sodas about 2 cans a day.  Past Medical History:  Diagnosis Date   Breast cancer ([place]) 1999   Stage I in left breast   Chronic combined  systolic and diastolic CHF (congestive heart failure) ([place])    a. 02/2013 Echo: EF 30-35%, diff HK, biatrial enlargement. b. 2D echo 07/2014 showed mildly dilated LV, EF 45-50%, severely dilated left atrium and moderately dilated right atrium.   CKD (chronic kidney disease), stage II    Closed fracture of unspecified part of neck of femur 2011   Right.  Soreness when standing. (Dr. Durward Fortes)   Nonischemic cardiomyopathy [facility])    a. 02/2013 [facility] CL: No ischemia, prob attenuation vs scar, EF 40%-->Med Rx.   Persistent atrial fibrillation ([place])    a. 02/2013: s/p TEE and attempted cardioversion x 2-->failed-->rate controlled with bb/digoxin, Xarelto initiated.    Past Surgical History:  Procedure Laterality Date   CARDIOVERSION N/A 02/18/2013   Procedure: CARDIOVERSION;  Surgeon: Jolaine Artist, MD;  Location: [facility] ENDOSCOPY;  Service: Cardiovascular;  Laterality: N/A;   CARDIOVERSION N/A 02/22/2013   Procedure: CARDIOVERSION;  Surgeon: Fay Records, MD;  Location[facility] ENDOSCOPY;  Service: Cardiovascular;  Laterality: N/A;   MASTECTOMY  1999   Left breast with saline implant.   PARTIAL HIP ARTHROPLASTY Right    TEE WITHOUT CARDIOVERSION N/A 02/18/2013   Procedure: TRANSESOPHAGEAL ECHOCARDIOGRAM (TEE);  Surgeon: Jolaine Artist, MD;  Location: [facility] ENDOSCOPY;  Service: Cardiovascular;  Laterality: N/A;   tubal ligation     TUBAL LIGATION      Current Medications: Outpatient Medications Prior to Visit  Medication Sig Dispense  Refill   diltiazem (CARDIZEM CD) 180 MG 24 hr capsule Take 1 capsule (180 mg total) by mouth daily. 90 capsule 3   metoprolol succinate (TOPROL-XL) 100 MG 24 hr tablet TAKE 1 TABLET BY MOUTH ONCE DAILY WITH MEALS OR  IMMEDIATELY  AFTER  A  MEAL 90 tablet 3   nitroGLYCERIN (NITROSTAT) 0.4 MG SL tablet DISSOLVE ONE TABLET UNDER THE TONGUE EVERY 5 MINUTES AS NEEDED FOR CHEST PAIN.  DO NOT EXCEED A TOTAL OF 3 DOSES IN 15 MINUTES 75 tablet 1   spironolactone  (ALDACTONE) 25 MG tablet Take 0.5 tablets (12.5 mg total) by mouth daily. 45 tablet 3   traMADol (ULTRAM) 50 MG tablet Take 1 tablet (50 mg total) by mouth every 8 (eight) hours as needed. 15 tablet 0   XARELTO 20 MG TABS tablet Take 1 tablet by mouth once daily 30 tablet 5   furosemide (LASIX) 40 MG tablet Take 40 mg by mouth as needed.     No facility-administered medications prior to visit.     Allergies:   Patient has no known allergies.   Social History   Socioeconomic History   Marital status: Married    Spouse name: Not on file   Number of children: Not on file   Years of education: Not on file   Highest education level: Not on file  Occupational History   Occupation: Scientist, water quality    Employer: [employer]  Tobacco Use   Smoking status: Never Smoker   Smokeless tobacco: Never Used  Substance and Sexual Activity   Alcohol use: Yes    Alcohol/week: 0.0 standard drinks    Comment: RARE   Drug use: No   Sexual activity: Never  Other Topics Concern   Not on file  Social History Narrative   High School grad.   Married 40+ years   Scientist, water quality at [place] Strain:    Difficulty of Paying Living Expenses:   Food Insecurity:    Worried About Charity fundraiser in the Last Year:    Arboriculturist in the Last Year:   Transportation Needs:    Film/video editor (Medical):    Lack of Transportation (Non-Medical):   Physical Activity:    Days of Exercise per Week:    Minutes of Exercise per Session:   Stress:    Feeling of Stress :   Social Connections:    Frequency of Communication with Friends and Family:    Frequency of Social Gatherings with Friends and Family:    Attends Religious Services:    Active Member of Clubs or Organizations:    Attends Music therapist:    Marital Status:     Family History:  The patient's family history includes COPD in her mother; Colon  cancer in her father; Congenital heart disease in her son; Heart attack in her father; Heart disease in her brother and father; Hypertension in her father.   ROS:   Please see the history of present illness.    ROS All other systems reviewed and are negative.  PHYSICAL EXAM:   VS:  BP 118/74    Pulse 65    Ht 5\' 2"  (1.575 m)    Wt 229 lb 9.6 oz (104.1 kg)    SpO2 95%    BMI 41.99 kg/m    GEN: Well nourished, well developed, in no acute distress  HEENT: normal  Neck: no JVD, carotid  bruits, or masses Cardiac: iRRR; no murmurs, rubs, or gallops,no edema  Respiratory:  clear to auscultation bilaterally, normal work of breathing GI: soft, nontender, nondistended, + BS MS: no deformity or atrophy  Skin: warm and dry, no rash Neuro:  Alert and Oriented x 3, Strength and sensation are intact Psych: euthymic mood, full affect  Wt Readings from Last 3 Encounters:  10/13/19 229 lb 9.6 oz (104.1 kg)  09/23/19 226 lb 12.8 oz (102.9 kg)  03/01/19 226 lb (102.5 kg)    Studies/Labs Reviewed:   EKG:  EKG is ordered today.  The ekg ordered today demonstrates Atrial fibrillation rapid ventricular response, 108 bpm, nonspecific T wave abnormalities. HR has increased since the last visit. This was personally reviewed.  Recent Labs: 03/01/2019: ALT 9; Hemoglobin 15.9; Platelets 240 09/23/2019: BUN 20; Creatinine, Ser 1.12; NT-Pro BNP 917; Potassium 4.7; Sodium 139; TSH 6.550   Lipid Panel    Component Value Date/Time   CHOL 198 03/11/2016 0911   TRIG 129 03/11/2016 0911   HDL 47 03/11/2016 0911   CHOLHDL 4.2 03/11/2016 0911   VLDL 26 03/11/2016 0911   LDLCALC 125 03/11/2016 0911   LDLDIRECT 137.1 03/29/2011 1016    Additional studies/ records that were reviewed today include:   TTE 08/05/2014 - Left ventricle: The cavity size was mildly dilated. Wall thickness was normal. Systolic function was mildly reduced. The estimated ejection fraction was in the range of 45% to 50%. - Left  atrium: The atrium was severely dilated. - Right atrium: The atrium was moderately dilated. - Atrial septum: No defect or patent foramen ovale was identified.    ASSESSMENT:    1. Chronic combined systolic and diastolic CHF, NYHA class 2 (HCC)   2. Stage 2 chronic kidney disease   3. Chronic combined systolic and diastolic CHF (congestive heart failure) ([place])   4. Essential hypertension   5. Mixed hyperlipidemia   6. Chronic anticoagulation   7. Atrial fibrillation, unspecified type ([place])   8. Disorder of phosphorus metabolism, unspecified      PLAN:  In order of problems listed above:  1. Chronic combined systolic and diastolic CHF, non-ischemic, tachycardia induced - LVEF now back to normal 30--> 50-55% in march 2015 - 45-50% in 08/2014. Today she is 3 pounds up from the last visit however pills minimally fluid overloaded, BNP on September 23, 2019 was 917, her heart rate is much better controlled now in 60s, I will continue higher dose of Cardizem and add Lasix 20 mg daily, and 40 as needed.   She is again advised about proper diet avoidance of sodas and regular walking.  2. Afib RVR: S/p TEE and 2 attempted DCCV on 02/18/13 and 02/22/13.  This has led to CHF in the past, I will increase Cardizem CD from 120 to 180 mg a day.  Heart rate much improved now 65 bpm.  3.  Hypertension - controlled, because of increasing Cardizem I will have to decrease spironolactone to 12.5 mg daily.  4. Obesity - advised to exercise.  Follow-up in 6 months, we will obtain labs prior to that appointment.  Medication Adjustments/Labs and Tests Ordered: Current medicines are reviewed at length with the patient today.  Concerns regarding medicines are outlined above.  Medication changes, Labs and Tests ordered today are listed in the Patient Instructions below. Patient Instructions  Medication Instructions:   START TAKING YOUR LASIX (FUROSEMIDE) 20 MG BY MOUTH DAILY  *If you need a refill on your cardiac  medications before  your next appointment, please call your pharmacy*   Lab Work:  PRIOR TO YOUR 6 MONTH FOLLOW-UP APPOINTMENT WITH DR. Meda Dudzinski--SCHEDULING WILL ARRANGE THIS CLOSER TO YOUR FOLLOW-UP APPOINTMENT--WE WILL CHECK A CMET, CBC, TSH, PRO-BNP, AND LIPIDS--PLEASE COME FASTING TO THIS LAB APPOINTMENT.  If you have labs (blood work) drawn today and your tests are completely normal, you will receive your results only by:  [place] (if you have MyChart) OR  A paper copy in the mail If you have any lab test that is abnormal or we need to change your treatment, we will call you to review the results.   Follow-Up: At [facility], you and your health needs are our priority.  As part of our continuing mission to provide you with exceptional heart care, we have created designated Provider Care Teams.  These Care Teams include your primary Cardiologist (physician) and Advanced Practice Providers (APPs -  Physician Assistants and Nurse Practitioners) who all work together to provide you with the care you need, when you need it.  We recommend signing up for the patient portal called "MyChart".  Sign up information is provided on this After Visit Summary.  MyChart is used to connect with patients for Virtual Visits (Telemedicine).  Patients are able to view lab/test results, encounter notes, upcoming appointments, etc.  Non-urgent messages can be sent to your provider as well.   To learn more about what you can do with MyChart, go to [email or web address].    Your next appointment:   6 month(s)--PT WILL NEED LABS PRIOR TO THIS VISIT PER DR. Meda Coffee  The format for your next appointment:   In Person--PT WILL NEED LABS PRIOR TO THIS VISIT PER DR. Meda Coffee  Provider:   Ena Dawley, MD        Signed, Ena Dawley, MD  10/13/2019 10:38 AM    [facility] [place], [place], [address]  [address] Phone: [phone]; Fax: [phone]

## 2019-10-14 ENCOUNTER — Other Ambulatory Visit: Payer: Self-pay | Admitting: Cardiology

## 2019-10-14 DIAGNOSIS — Z7901 Long term (current) use of anticoagulants: Secondary | ICD-10-CM

## 2019-10-14 DIAGNOSIS — I10 Essential (primary) hypertension: Secondary | ICD-10-CM

## 2019-10-14 DIAGNOSIS — I5042 Chronic combined systolic (congestive) and diastolic (congestive) heart failure: Secondary | ICD-10-CM

## 2019-10-14 DIAGNOSIS — E7849 Other hyperlipidemia: Secondary | ICD-10-CM

## 2019-12-28 ENCOUNTER — Encounter: Payer: Self-pay | Admitting: Orthopaedic Surgery

## 2019-12-28 ENCOUNTER — Ambulatory Visit (INDEPENDENT_AMBULATORY_CARE_PROVIDER_SITE_OTHER): Payer: Medicare Other

## 2019-12-28 ENCOUNTER — Other Ambulatory Visit: Payer: Self-pay

## 2019-12-28 ENCOUNTER — Ambulatory Visit (INDEPENDENT_AMBULATORY_CARE_PROVIDER_SITE_OTHER): Payer: Medicare Other | Admitting: Orthopaedic Surgery

## 2019-12-28 VITALS — Ht 61.0 in | Wt 227.0 lb

## 2019-12-28 DIAGNOSIS — Z96649 Presence of unspecified artificial hip joint: Secondary | ICD-10-CM

## 2019-12-28 DIAGNOSIS — M25551 Pain in right hip: Secondary | ICD-10-CM

## 2019-12-28 DIAGNOSIS — T8484XA Pain due to internal orthopedic prosthetic devices, implants and grafts, initial encounter: Secondary | ICD-10-CM

## 2019-12-28 NOTE — Progress Notes (Signed)
Office Visit Note   Patient: Natasha Dickson           Date of Birth: 1945-11-24           MRN: [id number] Visit Date: 12/28/2019              Requested by: Tonia Ghent, MD [address] [place],  [place] [address] PCP: Tonia Ghent, MD   Assessment & Plan: Visit Diagnoses:  1. Pain in right hip   2. Pain with hip hemiarthroplasty, initial encounter [facility])     Plan:  #1: At this time our plan is to proceed with a CT of the right hip with Mars technique.  We had the radiologist review the x-ray and this was chosen as the most efficient way to determine whether there is a cyst in the acetabular dome. #2: Follow back up after CT scan.  Follow-Up Instructions: No follow-ups on file.   Face-to-face time spent with patient was greater than 60 minutes.  Greater than 50% of the time was spent in counseling and coordination of care and further diagnostic evaluations.  Orders:  Orders Placed This Encounter  Procedures   XR HIP UNILAT W OR W/O PELVIS 2-3 VIEWS RIGHT   No orders of the defined types were placed in this encounter.     Procedures: No procedures performed   Clinical Data: No additional findings.   Subjective: Chief Complaint  Patient presents with   Right Hip - Pain   HPI Patient presents today for right hip pain. She broke her hip several years ago and had to have surgery to correct it.  This was a bipolar hemiarthroplasty to the right hip.  She states that she improved a lot after surgery, but was never 100%. She states that she was 5 years ago and received a cortisone injection and that seemed to help. In the last two years she has really worsened. Her pain is located in her groin. She has a very difficult time ambulating. No pain radiates down her leg. No numbness or tingling. She takes tylenol for pain.    Review of Systems  Constitutional: Negative for fatigue.  HENT: Negative for ear pain.   Eyes: Negative for pain.  Respiratory:  Negative for shortness of breath.   Cardiovascular: Positive for leg swelling.  Gastrointestinal: Negative for constipation and diarrhea.  Endocrine: Negative for cold intolerance and heat intolerance.  Genitourinary: Negative for difficulty urinating.  Musculoskeletal: Negative for joint swelling.  Skin: Negative for rash.  Allergic/Immunologic: Negative for food allergies.  Neurological: Negative for weakness.  Hematological: Does not bruise/bleed easily.  Psychiatric/Behavioral: Negative for sleep disturbance.     Objective: Vital Signs: Ht 5\' 1"  (1.549 m)    Wt 227 lb (103 kg)    BMI 42.89 kg/m   Physical Exam Constitutional:      Appearance: Normal appearance. She is well-developed. She is obese.  HENT:     Head: Normocephalic.  Eyes:     Pupils: Pupils are equal, round, and reactive to light.  Pulmonary:     Effort: Pulmonary effort is normal.  Skin:    General: Skin is warm and dry.  Neurological:     Mental Status: She is alert and oriented to person, place, and time.  Psychiatric:        Mood and Affect: Mood normal.        Behavior: Behavior normal.        Thought Content: Thought content normal.  Judgment: Judgment normal.     Ortho Exam  Exam today reveals decreased range of motion of the hip especially with internal and external rotation at 90 degrees of flexion.  She does have diffuse tenderness about the hip but this is very difficult to tell secondary to the exogenous obesity.  She did have some tenderness to palpation more in the groin area and on the lateral aspect of the hip.  I can get her to flex to about 100degrees.  Limited internal and external rotation.  Specialty Comments:  No specialty comments available.  Imaging: No results found.   PMFS History:  Current Outpatient Medications  Medication Sig Dispense Refill   diltiazem (CARDIZEM CD) 180 MG 24 hr capsule Take 1 capsule (180 mg total) by mouth daily. 90 capsule 3   furosemide  (LASIX) 20 MG tablet Take 1 tablet (20 mg total) by mouth daily. 90 tablet 2   metoprolol succinate (TOPROL-XL) 100 MG 24 hr tablet TAKE 1 TABLET BY MOUTH ONCE DAILY WITH MEALS OR  IMMEDIATELY  AFTER  A  MEAL 90 tablet 3   nitroGLYCERIN (NITROSTAT) 0.4 MG SL tablet DISSOLVE ONE TABLET UNDER THE TONGUE EVERY 5 MINUTES AS NEEDED FOR CHEST PAIN.  DO NOT EXCEED A TOTAL OF 3 DOSES IN 15 MINUTES 75 tablet 0   spironolactone (ALDACTONE) 25 MG tablet Take 0.5 tablets (12.5 mg total) by mouth daily. 45 tablet 3   traMADol (ULTRAM) 50 MG tablet Take 1 tablet (50 mg total) by mouth every 8 (eight) hours as needed. 15 tablet 0   XARELTO 20 MG TABS tablet Take 1 tablet by mouth once daily 30 tablet 5   No current facility-administered medications for this visit.     , Patient Active Problem List   Diagnosis Date Noted   Pain with hip hemiarthroplasty ([place]) 12/29/2019   Sciatica 10/03/2017   Elevated serum creatinine 02/12/2017   Advance care planning 03/15/2016   Skin lesion 03/15/2016   Fall at home 03/15/2016   Healthcare maintenance 03/15/2016   Cough 03/21/2015   Morbid obesity ([place]) 10/21/2014   Chronic combined systolic and diastolic CHF, NYHA class 2 ([place]) 10/21/2014   CKD (chronic kidney disease) 10/21/2014   Weight loss 07/20/2013   Appetite loss 07/20/2013   Hypokalemia 02/23/2013   Nonischemic cardiomyopathy ([place])    A-fib ([place]) 02/19/2013   Congestive dilated cardiomyopathy ([place]) 02/19/2013   Acute combined systolic and diastolic heart failure ([place]) 02/18/2013   SOB (shortness of breath) 02/15/2013   Hip fracture ([place]) 03/27/2011   Abnormal Pap smear of cervix 03/27/2011   Hyperlipidemia 03/27/2011   Past Medical History:  Diagnosis Date   Breast cancer ([place]) 1999   Stage I in left breast   Chronic combined systolic and diastolic CHF (congestive heart failure) ([place])    a. 02/2013 Echo: EF 30-35%, diff HK, biatrial enlargement. b. 2D echo 07/2014  showed mildly dilated LV, EF 45-50%, severely dilated left atrium and moderately dilated right atrium.   CKD (chronic kidney disease), stage II    Closed fracture of unspecified part of neck of femur 2011   Right.  Soreness when standing. (Dr. Durward Fortes)   Nonischemic cardiomyopathy [facility])    a. 02/2013 Lexi CL: No ischemia, prob attenuation vs scar, EF 40%-->Med Rx.   Persistent atrial fibrillation ([place])    a. 02/2013: s/p TEE and attempted cardioversion x 2-->failed-->rate controlled with bb/digoxin, Xarelto initiated.    Family History  Problem Relation Age of Onset   Congenital heart  disease Son         transposition of great vessels- corrected with  MI  as an adult   COPD Mother        from long-term smoking   Colon cancer Father    Heart disease Father    Heart attack Father    Hypertension Father    Heart disease Brother    Stroke Neg Hx     Past Surgical History:  Procedure Laterality Date   CARDIOVERSION N/A 02/18/2013   Procedure: CARDIOVERSION;  Surgeon: Jolaine Artist, MD;  Location: [facility] ENDOSCOPY;  Service: Cardiovascular;  Laterality: N/A;   CARDIOVERSION N/A 02/22/2013   Procedure: CARDIOVERSION;  Surgeon: Fay Records, MD;  Location: [facility] ENDOSCOPY;  Service: Cardiovascular;  Laterality: N/A;   MASTECTOMY  1999   Left breast with saline implant.   PARTIAL HIP ARTHROPLASTY Right    TEE WITHOUT CARDIOVERSION N/A 02/18/2013   Procedure: TRANSESOPHAGEAL ECHOCARDIOGRAM (TEE);  Surgeon: Jolaine Artist, MD;  Location: [facility] ENDOSCOPY;  Service: Cardiovascular;  Laterality: N/A;   tubal ligation     TUBAL LIGATION     Social History   Occupational History   Occupation: Surveyor, quantity: [employer]  Tobacco Use   Smoking status: Never Smoker   Smokeless tobacco: Never Used  Vaping Use   Vaping Use: Never used  Substance and Sexual Activity   Alcohol use: Yes    Alcohol/week: 0.0 standard drinks    Comment: RARE   Drug use: No    Sexual activity: Never

## 2019-12-29 ENCOUNTER — Encounter: Payer: Self-pay | Admitting: Orthopaedic Surgery

## 2019-12-29 DIAGNOSIS — T8484XA Pain due to internal orthopedic prosthetic devices, implants and grafts, initial encounter: Secondary | ICD-10-CM | POA: Insufficient documentation

## 2019-12-31 ENCOUNTER — Telehealth: Payer: Self-pay | Admitting: Cardiology

## 2019-12-31 ENCOUNTER — Other Ambulatory Visit: Payer: Self-pay

## 2019-12-31 ENCOUNTER — Telehealth: Payer: Self-pay | Admitting: Orthopaedic Surgery

## 2019-12-31 DIAGNOSIS — Z96641 Presence of right artificial hip joint: Secondary | ICD-10-CM

## 2019-12-31 NOTE — Telephone Encounter (Signed)
Spoke with patient. CT scan has been ordered according to dictation. Patient states that Aaron Edelman was suppose to call him back after appointment to discuss whether it would be a CT or MRI ordered and why. I advised patient of what his dictation said.

## 2019-12-31 NOTE — Telephone Encounter (Signed)
  Patient is calling because she is having increasing fatigue since the weather has warmed up. She states that she stays in afib all the time. She has a cyst on her hip and she may need surgery and she wants to speak to the doctor to get her advice.

## 2019-12-31 NOTE — Telephone Encounter (Signed)
Attempted to call the pt back x 2 and phone was busy both times.

## 2019-12-31 NOTE — Telephone Encounter (Signed)
Pts husband Shanon Brow states an MRI or CT was supposed to be set up for the pt and they still have not heard anything. Pt would like a CB   [phone]

## 2020-01-19 ENCOUNTER — Ambulatory Visit
Admission: RE | Admit: 2020-01-19 | Discharge: 2020-01-19 | Disposition: A | Discharge status: Home / Self Care | Payer: Medicare Other | Source: Ambulatory Visit | Attending: Orthopedic Surgery | Admitting: Orthopedic Surgery

## 2020-01-19 DIAGNOSIS — Z96641 Presence of right artificial hip joint: Secondary | ICD-10-CM

## 2020-01-19 DIAGNOSIS — M8588 Other specified disorders of bone density and structure, other site: Secondary | ICD-10-CM | POA: Diagnosis not present

## 2020-01-19 DIAGNOSIS — M25551 Pain in right hip: Secondary | ICD-10-CM | POA: Diagnosis not present

## 2020-01-20 ENCOUNTER — Encounter: Payer: Self-pay | Admitting: Orthopaedic Surgery

## 2020-01-20 ENCOUNTER — Other Ambulatory Visit: Payer: Self-pay | Admitting: Cardiology

## 2020-01-20 ENCOUNTER — Ambulatory Visit (INDEPENDENT_AMBULATORY_CARE_PROVIDER_SITE_OTHER): Payer: Medicare Other | Admitting: Orthopaedic Surgery

## 2020-01-20 ENCOUNTER — Other Ambulatory Visit: Payer: Self-pay

## 2020-01-20 VITALS — Ht 61.0 in | Wt 227.0 lb

## 2020-01-20 DIAGNOSIS — M25551 Pain in right hip: Secondary | ICD-10-CM | POA: Diagnosis not present

## 2020-01-20 NOTE — Telephone Encounter (Signed)
Pt last saw Dr Meda Coffee 10/13/19, last labs 09/23/19 Creat 1.12, age 74, weight 103kg, CrCl 72.74, based on CrCl pt is on the appropriate dosage of Xarelto 20mg  QD.  Will refill rx.

## 2020-01-20 NOTE — Progress Notes (Signed)
Office Visit Note   Patient: Natasha Dickson           Date of Birth: Dec 18, 1945           MRN: [id number] Visit Date: 01/20/2020              Requested by: Tonia Ghent, MD [address] [place],  [place] [address] PCP: Tonia Ghent, MD   Assessment & Plan: Visit Diagnoses:  1. Pain in right hip     Plan: Mrs. Galloway is accompanied by her husband and here for follow-up evaluation of her right hip pain.  She notes that ever since she had a hemiarthroplasty of her right hip several years ago she has been having some trouble.  X-rays did not demonstrate any acute changes.  There is some stress shielding beneath the collar on the prosthesis.  I ordered a CT scan of her hip without contrast.  This reveals no hardware complications, acetabular bony lesions or fluid collections.  Muscles and tendons are grossly intact.  She did have severe osteopenia.  Long discussion with Mrs. Lesniewski and her husband.  I suspect her pain originates from her hip and would like to get a diagnostic injection with Dr. Ernestina Patches.  If this is not helpful I would consider further evaluation of her lumbar spine as she does have a history of sciatica.  It might be that her pain is related to the bipolar arthroplasty with ultimate solution a conversion to total hip replacement.  Office visit over 40 minutes  Follow-Up Instructions: Return in about 1 month (around 02/20/2020).   Orders:  Orders Placed This Encounter  Procedures   Ambulatory referral to Physical Medicine Rehab   No orders of the defined types were placed in this encounter.     Procedures: No procedures performed   Clinical Data: No additional findings.   Subjective: Chief Complaint  Patient presents with   Right Hip - Follow-up    CT review  Patient presents today for follow up on her right hip. She had a CT scan of her hip on 01/19/2020 and is here today for those results. No changes since her last visit.  HPI  Review of  Systems   Objective: Vital Signs: Ht 5\' 1"  (1.549 m)    Wt 227 lb (103 kg)    BMI 42.89 kg/m   Physical Exam Constitutional:      Appearance: She is well-developed.  Eyes:     Pupils: Pupils are equal, round, and reactive to light.  Pulmonary:     Effort: Pulmonary effort is normal.  Skin:    General: Skin is warm and dry.  Neurological:     Mental Status: She is alert and oriented to person, place, and time.  Psychiatric:        Behavior: Behavior normal.     Ortho Exam BMI 43.  Comfortable sitting.  No pain with range of motion of either hip.  Large legs.  Straight leg raise negative bilaterally.  I could not elicit any pain with any motion of her right hip.  She does have some discomfort when she gets up from a sitting position and walks with a slight limp and a short based gait. Specialty Comments:  No specialty comments available.  Imaging: No results found.   PMFS History: Patient Active Problem List   Diagnosis Date Noted   Pain in right hip 01/20/2020   Pain with hip hemiarthroplasty ([place]) 12/29/2019   Sciatica  10/03/2017   Elevated serum creatinine 02/12/2017   Advance care planning 03/15/2016   Skin lesion 03/15/2016   Fall at home 03/15/2016   Healthcare maintenance 03/15/2016   Cough 03/21/2015   Morbid obesity ([place]) 10/21/2014   Chronic combined systolic and diastolic CHF, NYHA class 2 ([place]) 10/21/2014   CKD (chronic kidney disease) 10/21/2014   Weight loss 07/20/2013   Appetite loss 07/20/2013   Hypokalemia 02/23/2013   Nonischemic cardiomyopathy ([place])    A-fib ([place]) 02/19/2013   Congestive dilated cardiomyopathy ([place]) 02/19/2013   Acute combined systolic and diastolic heart failure ([place]) 02/18/2013   SOB (shortness of breath) 02/15/2013   Hip fracture ([place]) 03/27/2011   Abnormal Pap smear of cervix 03/27/2011   Hyperlipidemia 03/27/2011   Past Medical History:  Diagnosis Date   Breast cancer ([place]) 1999   Stage I in  left breast   Chronic combined systolic and diastolic CHF (congestive heart failure) ([place])    a. 02/2013 Echo: EF 30-35%, diff HK, biatrial enlargement. b. 2D echo 07/2014 showed mildly dilated LV, EF 45-50%, severely dilated left atrium and moderately dilated right atrium.   CKD (chronic kidney disease), stage II    Closed fracture of unspecified part of neck of femur 2011   Right.  Soreness when standing. (Dr. Durward Fortes)   Nonischemic cardiomyopathy [facility])    a. 02/2013 [place] CL: No ischemia, prob attenuation vs scar, EF 40%-->Med Rx.   Persistent atrial fibrillation ([place])    a. 02/2013: s/p TEE and attempted cardioversion x 2-->failed-->rate controlled with bb/digoxin, Xarelto initiated.    Family History  Problem Relation Age of Onset   Congenital heart disease Son         transposition of great vessels- corrected with  MI  as an adult   COPD Mother        from long-term smoking   Colon cancer Father    Heart disease Father    Heart attack Father    Hypertension Father    Heart disease Brother    Stroke Neg Hx     Past Surgical History:  Procedure Laterality Date   CARDIOVERSION N/A 02/18/2013   Procedure: CARDIOVERSION;  Surgeon: Jolaine Artist, MD;  Location: [facility] ENDOSCOPY;  Service: Cardiovascular;  Laterality: N/A;   CARDIOVERSION N/A 02/22/2013   Procedure: CARDIOVERSION;  Surgeon: Fay Records, MD;  Location: [facility] ENDOSCOPY;  Service: Cardiovascular;  Laterality: N/A;   MASTECTOMY  1999   Left breast with saline implant.   PARTIAL HIP ARTHROPLASTY Right    TEE WITHOUT CARDIOVERSION N/A 02/18/2013   Procedure: TRANSESOPHAGEAL ECHOCARDIOGRAM (TEE);  Surgeon: Jolaine Artist, MD;  Location[facility] ENDOSCOPY;  Service: Cardiovascular;  Laterality: N/A;   tubal ligation     TUBAL LIGATION     Social History   Occupational History   Occupation: Surveyor, quantity: [employer]  Tobacco Use   Smoking status: Never Smoker   Smokeless tobacco: Never Used   Vaping Use   Vaping Use: Never used  Substance and Sexual Activity   Alcohol use: Yes    Alcohol/week: 0.0 standard drinks    Comment: RARE   Drug use: No   Sexual activity: Never

## 2020-01-24 ENCOUNTER — Telehealth: Payer: Self-pay | Admitting: Orthopaedic Surgery

## 2020-01-24 NOTE — Telephone Encounter (Signed)
Called again to schedule. Again all cannot be completed at this time.

## 2020-01-24 NOTE — Telephone Encounter (Signed)
Patient called requesting an appt sometime this week. I explained Dr. Durward Fortes only has 9/1 or 9/2. Patient states Natasha Dickson is going out of town and need right hip cortisone injection. Can we see if we have a slot for her. Please call patient back at [phone].

## 2020-01-24 NOTE — Telephone Encounter (Signed)
Called patient, bur call cannot be completed at this time.

## 2020-01-24 NOTE — Telephone Encounter (Signed)
Please see below. Thanks!

## 2020-01-25 ENCOUNTER — Telehealth: Payer: Self-pay

## 2020-01-25 NOTE — Telephone Encounter (Signed)
Tried to call patient a third time to schedule. Documented in referral.

## 2020-01-25 NOTE — Telephone Encounter (Signed)
Scheduled

## 2020-01-25 NOTE — Telephone Encounter (Signed)
Patient husband called in wanting to speak about getting sch for injection . Says patient was missing calls . Wants you to contact him in regards of getting her sch.

## 2020-01-27 ENCOUNTER — Ambulatory Visit (INDEPENDENT_AMBULATORY_CARE_PROVIDER_SITE_OTHER): Payer: Medicare Other | Admitting: Physical Medicine and Rehabilitation

## 2020-01-27 ENCOUNTER — Encounter: Payer: Self-pay | Admitting: Physical Medicine and Rehabilitation

## 2020-01-27 ENCOUNTER — Ambulatory Visit: Payer: Self-pay

## 2020-01-27 ENCOUNTER — Other Ambulatory Visit: Payer: Self-pay

## 2020-01-27 DIAGNOSIS — M25551 Pain in right hip: Secondary | ICD-10-CM

## 2020-01-27 MED ORDER — BUPIVACAINE HCL 0.25 % IJ SOLN
4.0000 mL | INTRAMUSCULAR | Status: AC | PRN
  Administered 2020-01-27: 4 mL via INTRA_ARTICULAR

## 2020-01-27 MED ORDER — TRIAMCINOLONE ACETONIDE 40 MG/ML IJ SUSP
60.0000 mg | INTRAMUSCULAR | Status: AC | PRN
  Administered 2020-01-27: 60 mg via INTRA_ARTICULAR

## 2020-01-27 NOTE — Progress Notes (Signed)
Pt state right hip pain that travels to her groin area. Pt state getting up have to walking. Pt state she sit down to makes it feel a little better.  Numeric Pain Rating Scale and Functional Assessment Average Pain 2   In the last MONTH (on 0-10 scale) has pain interfered with the following?  1. General activity like being  able to carry out your everyday physical activities such as walking, climbing stairs, carrying groceries, or moving a chair?  Rating(7)   +Driver, +BT, -Dye Allergies.

## 2020-01-27 NOTE — Progress Notes (Signed)
   Natasha Dickson - 74 y.o. female MRN [id number]  Date of birth: Mar 13, 1946  Office Visit Note: Visit Date: 01/27/2020 PCP: Tonia Ghent, MD Referred by: Tonia Ghent, MD  Subjective: Chief Complaint  Patient presents with  . Right Hip - Pain   HPI:  Natasha Dickson is a 74 y.o. female who comes in today at the request of Dr. Joni Fears for planned Right anesthetic hip arthrogram with fluoroscopic guidance.  The patient has failed conservative care including home exercise, medications, time and activity modification.  This injection will be diagnostic and hopefully therapeutic.  Please see requesting physician notes for further details and justification.   ROS Otherwise per HPI.  Assessment & Plan: Visit Diagnoses:  1. Pain in right hip     Plan: Findings:  Arthrogram demonstrated excellent flow of contrast underneath the ball and around the stem confirming pseudojoint without extravasation or obvious defect.  The patient had some relief of symptoms during the anesthetic phase of the injection.  She did feel like the leg felt loose at that point after the anesthetic part of it.  During the injection itself we instilled about 4 mL of injectate without difficulty but the last 1 mL caused her some concordant pain of the hip joint.    Meds & Orders: No orders of the defined types were placed in this encounter.   Orders Placed This Encounter  Procedures  . Large Joint Inj: R hip joint  . XR C-ARM NO REPORT    Follow-up: Return in about 2 weeks (around 02/10/2020) for Joni Fears, MD.   Procedures: Large Joint Inj: R hip joint on 01/27/2020 10:43 AM Indications: pain and diagnostic evaluation Details: 22 G needle, anterior approach  Arthrogram: Yes  Medications: 4 mL bupivacaine 0.25 %; 60 mg triamcinolone acetonide 40 MG/ML Outcome: tolerated well, no immediate complications  Arthrogram demonstrated excellent flow of contrast underneath the ball and around the stem  confirming pseudojoint without extravasation or obvious defect.  The patient had some relief of symptoms during the anesthetic phase of the injection.  She did feel like the leg felt loose at that point after the anesthetic part of it.  During the injection itself we instilled about 4 mL of injectate without difficulty but the last 1 mL caused her some concordant pain of the hip joint.  Procedure, treatment alternatives, risks and benefits explained, specific risks discussed. Consent was given by the patient. Immediately prior to procedure a time out was called to verify the correct patient, procedure, equipment, support staff and site/side marked as required. Patient was prepped and draped in the usual sterile fashion.      No notes on file   Clinical History: No specialty comments available.     Objective:  VS:  HT:    WT:   BMI:     BP:   HR: bpm  TEMP: ( )  RESP:  Physical Exam   Imaging: XR C-ARM NO REPORT  Result Date: 01/27/2020 Please see Notes tab for imaging impression.

## 2020-02-22 ENCOUNTER — Ambulatory Visit (INDEPENDENT_AMBULATORY_CARE_PROVIDER_SITE_OTHER): Payer: Medicare Other | Admitting: Orthopaedic Surgery

## 2020-02-22 ENCOUNTER — Encounter: Payer: Self-pay | Admitting: Orthopaedic Surgery

## 2020-02-22 ENCOUNTER — Other Ambulatory Visit: Payer: Self-pay

## 2020-02-22 VITALS — Ht 61.0 in | Wt 227.0 lb

## 2020-02-22 DIAGNOSIS — T8484XA Pain due to internal orthopedic prosthetic devices, implants and grafts, initial encounter: Secondary | ICD-10-CM | POA: Diagnosis not present

## 2020-02-22 DIAGNOSIS — Z96641 Presence of right artificial hip joint: Secondary | ICD-10-CM

## 2020-02-22 NOTE — Progress Notes (Signed)
Office Visit Note   Patient: Natasha Dickson           Date of Birth: [birth year]           MRN: [id number] Visit Date: 02/22/2020              Requested by: Tonia Ghent, MD [address] [place],  [place] [address] PCP: Tonia Ghent, MD   Assessment & Plan: Visit Diagnoses:  1. Pain with hip hemiarthroplasty, initial encounter [facility])     Plan: Mrs. Bittick is approximately 1 month status post intra-articular cortisone injection of her right hip by Dr. Ernestina Patches.  She relates that she is feeling much better with significantly less pain.  She still has a little start up discomfort but overall feels like she is more stable and able to sleep and function with less pain.  Long discussion regarding all of the above she certainly can call Dr. Ernestina Patches for follow-up cortisone injections.  She has significant medical comorbidities including issues with her heart and I do not think she is a good surgical candidate at this point to consider conversion of her hemiarthroplasty to a total hip replacement.  I discussed that with both she and her husband . Also had a long discussion regarding weight loss as it will help her right hip pain.  They are going to discuss that with either her cardiologist or her primary care physician as in the past she has lost weight with Lasix  Follow-Up Instructions: Return if symptoms worsen or fail to improve.   Orders:  No orders of the defined types were placed in this encounter.  No orders of the defined types were placed in this encounter.     Procedures: No procedures performed   Clinical Data: No additional findings.   Subjective: Chief Complaint  Patient presents with  . Right Hip - Follow-up  Patient presents today for follow up on her right hip. She received an injection into her right hip with Dr.Newton on 01/27/2020. She states that she noticed a lot of improvement the following day, and still helping. She is not taking anything for pain.    Mr. Colan relates that his wife lost about "80 pounds" when she was in the hospital recently related to "IV Lasix".  The going to discuss that with her primary care physician and her cardiologist and attempt to lose weight  HPI  Review of Systems  Constitutional: Negative for fatigue.  HENT: Negative for ear pain.   Eyes: Negative for pain.  Respiratory: Positive for shortness of breath.   Cardiovascular: Negative for leg swelling.  Gastrointestinal: Negative for constipation and diarrhea.  Endocrine: Negative for cold intolerance and heat intolerance.  Genitourinary: Negative for difficulty urinating.  Musculoskeletal: Negative for joint swelling.  Skin: Negative for rash.  Allergic/Immunologic: Negative for food allergies.  Neurological: Negative for weakness.  Hematological: Does not bruise/bleed easily.  Psychiatric/Behavioral: Negative for sleep disturbance.     Objective: Vital Signs: Ht 5\' 1"  (1.549 m)   Wt 227 lb (103 kg)   BMI 42.89 kg/m   Physical Exam Constitutional:      Appearance: She is well-developed.  Eyes:     Pupils: Pupils are equal, round, and reactive to light.  Pulmonary:     Effort: Pulmonary effort is normal.  Skin:    General: Skin is warm and dry.  Neurological:     Mental Status: She is alert and oriented to person, place, and time.  Psychiatric:        Behavior: Behavior normal.     Ortho Exam awake alert and oriented x3.  Comfortable sitting.  BMI is 43.  Painless range of motion right hip in the sitting position.  When she ambulates she has a little bit of a limp and start up pain but felt better as she walked a few feet.  Not using any ambulatory aid.  Specialty Comments:  No specialty comments available.  Imaging: No results found.   PMFS History: Patient Active Problem List   Diagnosis Date Noted  . Pain in right hip 01/20/2020  . Pain with hip hemiarthroplasty ([place]) 12/29/2019  . Sciatica 10/03/2017  . Elevated serum  creatinine 02/12/2017  . Advance care planning 03/15/2016  . Skin lesion 03/15/2016  . Fall at home 03/15/2016  . Healthcare maintenance 03/15/2016  . Cough 03/21/2015  . Morbid obesity ([place]) 10/21/2014  . Chronic combined systolic and diastolic CHF, NYHA class 2 ([place]) 10/21/2014  . CKD (chronic kidney disease) 10/21/2014  . Weight loss 07/20/2013  . Appetite loss 07/20/2013  . Hypokalemia 02/23/2013  . Nonischemic cardiomyopathy ([place])   . A-fib ([place]) 02/19/2013  . Congestive dilated cardiomyopathy ([place]) 02/19/2013  . Acute combined systolic and diastolic heart failure ([place]) 02/18/2013  . SOB (shortness of breath) 02/15/2013  . Hip fracture ([place]) 03/27/2011  . Abnormal Pap smear of cervix 03/27/2011  . Hyperlipidemia 03/27/2011   Past Medical History:  Diagnosis Date  . Breast cancer ([place]) 1999   Stage I in left breast  . Chronic combined systolic and diastolic CHF (congestive heart failure) ([place])    a. 02/2013 Echo: EF 30-35%, diff HK, biatrial enlargement. b. 2D echo 07/2014 showed mildly dilated LV, EF 45-50%, severely dilated left atrium and moderately dilated right atrium.  . CKD (chronic kidney disease), stage II   . Closed fracture of unspecified part of neck of femur 2011   Right.  Soreness when standing. (Dr. Durward Fortes)  . Nonischemic cardiomyopathy ([place])    a. 02/2013 [place] CL: No ischemia, prob attenuation vs scar, EF 40%-->Med Rx.  . Persistent atrial fibrillation ([place])    a. 02/2013: s/p TEE and attempted cardioversion x 2-->failed-->rate controlled with bb/digoxin, Xarelto initiated.    Family History  Problem Relation Age of Onset  . Congenital heart disease Son         transposition of great vessels- corrected with  MI  as an adult  . COPD Mother        from long-term smoking  . Colon cancer Father   . Heart disease Father   . Heart attack Father   . Hypertension Father   . Heart disease Brother   . Stroke Neg Hx     Past Surgical History:  Procedure  Laterality Date  . CARDIOVERSION N/A 02/18/2013   Procedure: CARDIOVERSION;  Surgeon: Jolaine Artist, MD;  Location: [facility] ENDOSCOPY;  Service: Cardiovascular;  Laterality: N/A;  . CARDIOVERSION N/A 02/22/2013   Procedure: CARDIOVERSION;  Surgeon: Fay Records, MD;  Location: [place] [place];  Service: Cardiovascular;  Laterality: N/A;  . MASTECTOMY  1999   Left breast with saline implant.  [facility] PARTIAL HIP ARTHROPLASTY Right   . TEE WITHOUT CARDIOVERSION N/A 02/18/2013   Procedure: TRANSESOPHAGEAL ECHOCARDIOGRAM (TEE);  Surgeon: Jolaine Artist, MD;  Location: [facility] ENDOSCOPY;  Service: Cardiovascular;  Laterality: N/A;  . tubal ligation    . TUBAL LIGATION     Social History   Occupational History  . Occupation: Scientist, water quality  Employer: [employer]  Tobacco Use  . Smoking status: Never Smoker  . Smokeless tobacco: Never Used  Vaping Use  . Vaping Use: Never used  Substance and Sexual Activity  . Alcohol use: Yes    Alcohol/week: 0.0 standard drinks    Comment: RARE  . Drug use: No  . Sexual activity: Never

## 2020-03-30 ENCOUNTER — Other Ambulatory Visit: Payer: Self-pay | Admitting: Cardiology

## 2020-03-30 NOTE — Telephone Encounter (Signed)
Pt last saw Dr Meda Coffee 10/13/19, last labs 09/23/19 Creat 1.12, age 74, weight 103kg, CrCl 71.66, based on specified criteria pt is on appropriate dosage of Xarelto 20mg  QD.  Will refill rx.

## 2020-05-01 NOTE — Progress Notes (Signed)
CARDIOLOGY OFFICE NOTE  Date:  05/15/2020    Natasha Dickson Date of Birth: April 08, 1946 Medical Record #[id number]  PCP:  Tonia Ghent, MD  Cardiologist:  Meda Coffee  Chief Complaint  Patient presents with  . Follow-up    Seen for Dr. Meda Coffee    History of Present Illness: Natasha Dickson is a 74 y.o. female who presents today for a follow up visit. Seen for Dr. Meda Coffee.  She has a history of combined systolic and diastolic CHF dating back to 2014,  A-fib with RVR. Her original echocardiogram showed LVEF 30-35% with diffuse hypokinesis and her stress test was negative for ischemia.  Cardiomyopathy was believed to be secondary to tachycardia associated with atrial fibrillation. LVEF improved to normal 50-55% in 2015 but then decreased to 45-50% in 2016. Her weight has fluctuated due to diet choices - as low as 170 and as high as 230 - loves soda. Other issues include HTN, HLD and CKD.   Last seen in May - had had some DOE that was improving.    Comes in today. Here alone. Has had recent labs - BNP elevated. She has chronic DOE - this is not worse at all. No chest pain but last week did some heavy cleaning - more than she normally does in regards to activities - did have some aches that were worse with turning and twisting. This resolved. She does not have chest pain with when she walks. Not dizzy. No syncope. She is more worried about the possibility that her husband is not feeling good and that he may need to see pulmonary. She admits she still loves soda - especially Coke and Mtn Dew.   Past Medical History:  Diagnosis Date  . Breast cancer ([place]) 1999   Stage I in left breast  . Chronic combined systolic and diastolic CHF (congestive heart failure) ([place])    a. 02/2013 Echo: EF 30-35%, diff HK, biatrial enlargement. b. 2D echo 07/2014 showed mildly dilated LV, EF 45-50%, severely dilated left atrium and moderately dilated right atrium.  . CKD (chronic kidney disease), stage II   .  Closed fracture of unspecified part of neck of femur 2011   Right.  Soreness when standing. (Dr. Durward Fortes)  . Nonischemic cardiomyopathy ([place])    a. 02/2013 Lexi CL: No ischemia, prob attenuation vs scar, EF 40%-->Med Rx.  . Persistent atrial fibrillation ([place])    a. 02/2013: s/p TEE and attempted cardioversion x 2-->failed-->rate controlled with bb/digoxin, Xarelto initiated.    Past Surgical History:  Procedure Laterality Date  . CARDIOVERSION N/A 02/18/2013   Procedure: CARDIOVERSION;  Surgeon: Jolaine Artist, MD;  Location[facility] ENDOSCOPY;  Service: Cardiovascular;  Laterality: N/A;  . CARDIOVERSION N/A 02/22/2013   Procedure: CARDIOVERSION;  Surgeon: Fay Records, MD;  Location: [place];  Service: Cardiovascular;  Laterality: N/A;  . MASTECTOMY  1999   Left breast with saline implant.  [facility] PARTIAL HIP ARTHROPLASTY Right   . TEE WITHOUT CARDIOVERSION N/A 02/18/2013   Procedure: TRANSESOPHAGEAL ECHOCARDIOGRAM (TEE);  Surgeon: Jolaine Artist, MD;  Location: [facility] ENDOSCOPY;  Service: Cardiovascular;  Laterality: N/A;  . tubal ligation    . TUBAL LIGATION       Medications: Current Meds  Medication Sig  . diltiazem (CARDIZEM CD) 180 MG 24 hr capsule Take 1 capsule (180 mg total) by mouth daily.  . furosemide (LASIX) 20 MG tablet Take 1 tablet (20 mg total) by mouth daily.  . metoprolol succinate (TOPROL-XL)  100 MG 24 hr tablet TAKE 1 TABLET BY MOUTH ONCE DAILY WITH MEALS OR  IMMEDIATELY  AFTER  A  MEAL  . nitroGLYCERIN (NITROSTAT) 0.4 MG SL tablet DISSOLVE ONE TABLET UNDER THE TONGUE EVERY 5 MINUTES AS NEEDED FOR CHEST PAIN.  DO NOT EXCEED A TOTAL OF 3 DOSES IN 15 MINUTES  . spironolactone (ALDACTONE) 25 MG tablet Take 0.5 tablets (12.5 mg total) by mouth daily.  . traMADol (ULTRAM) 50 MG tablet Take 1 tablet (50 mg total) by mouth every 8 (eight) hours as needed.  Alveda Reasons 20 MG TABS tablet Take 1 tablet by mouth once daily     Allergies: No Known Allergies  Social  History: The patient  reports that she has never smoked. She has never used smokeless tobacco. She reports current alcohol use. She reports that she does not use drugs.   Family History: The patient's family history includes COPD in her mother; Colon cancer in her father; Congenital heart disease in her son; Heart attack in her father; Heart disease in her brother and father; Hypertension in her father.   Review of Systems: Please see the history of present illness.   All other systems are reviewed and negative.   Physical Exam: VS:  BP 116/70   Pulse 84   Ht 5\' 1"  (1.549 m)   Wt 227 lb (103 kg)   SpO2 98%   BMI 42.89 kg/m  .  BMI Body mass index is 42.89 kg/m.  Wt Readings from Last 3 Encounters:  05/15/20 227 lb (103 kg)  02/22/20 227 lb (103 kg)  01/20/20 227 lb (103 kg)    General:  Alert and in no acute distress. She is obese.  Her weight today is 227# Cardiac: Irregular irregular rhythm. Her rate is ok. Heart tones are distant. No significant edema.  Respiratory:  Lungs are clear to auscultation bilaterally with normal work of breathing.  GI: Soft and nontender.  MS: No deformity or atrophy. Gait and ROM intact.  Skin: Warm and dry. Color is normal.  Neuro:  Strength and sensation are intact and no gross focal deficits noted.  Psych: Alert, appropriate and with normal affect.   LABORATORY DATA:  EKG:  EKG is ordered today.  Personally reviewed by me. This demonstrates AF with controlled VR of 84.  Lab Results  Component Value Date   WBC 6.7 05/12/2020   HGB 13.9 05/12/2020   HCT 41.5 05/12/2020   PLT 259 05/12/2020   GLUCOSE 94 05/12/2020   CHOL 203 (H) 05/12/2020   TRIG 110 05/12/2020   HDL 51 05/12/2020   LDLDIRECT 137.1 03/29/2011   LDLCALC 132 (H) 05/12/2020   ALT 6 05/12/2020   AST 11 05/12/2020   NA 142 05/12/2020   K 4.3 05/12/2020   CL 107 (H) 05/12/2020   CREATININE 1.16 (H) 05/12/2020   BUN 13 05/12/2020   CO2 22 05/12/2020   TSH 5.140 (H)  05/12/2020   INR 1.31 02/15/2013   HGBA1C 5.7 (H) 02/16/2013     BNP (last 3 results) No results for input(s): BNP in the last 8760 hours.  ProBNP (last 3 results) Recent Labs    09/23/19 1025 05/12/20 1114  PROBNP 917* 1,712*     Other Studies Reviewed Today:  TTE 08/05/2014 - Left ventricle: The cavity size was mildly dilated. Wall thickness was normal. Systolic function was mildly reduced. The estimated ejection fraction was in the range of 45% to 50%. - Left atrium: The atrium was severely  dilated. - Right atrium: The atrium was moderately dilated. - Atrial septum: No defect or patent foramen ovale was identified.     ASSESSMENT:    1. Chronic combined systolic and diastolic HF - last measurement of EF was from 2016 - EF of 45 to 50% - now with elevated BNP but really ok given her age and this was a pro BNP - she can increase her Lasix for 3 days - then back to her regular dose. Needs salt restriction. Her symptoms are stable. No other changes made today.   2. Persistent AF - rate is ok - no change with current regimen.   3. CKD - most recent check was stable.   4. HTN - BP is fine - no changes made today.   5. HLD - not on therapy - she is agreeable to starting Lipitor 10 mg a day - repeat lab in 6 weeks.   6. Obesity - loves soda - tried to be encouraging.   7. Chronic anticoagulation - on Xarelto - no problems noted.   8. Elevated TSH - would defer to her PCP.     Orders Placed This Encounter  Procedures  . EKG 12-Lead     Disposition:   FU with Dr. Meda Coffee in 6 months. Starting statin. Defer management of elevated TSH to her PCP.  Short increase in diuretics. Overall felt to be stable at this time.   Patient is agreeable to this plan and will call if any problems develop in the interim.   SignedTruitt Merle, NP  05/15/2020 11:15 AM  [place] [address] [place], [place]  [address] Phone:  [phone] Fax: [phone]

## 2020-05-12 ENCOUNTER — Other Ambulatory Visit: Payer: Self-pay

## 2020-05-12 ENCOUNTER — Other Ambulatory Visit: Payer: Medicare Other

## 2020-05-12 ENCOUNTER — Other Ambulatory Visit: Payer: Medicare Other | Admitting: *Deleted

## 2020-05-12 DIAGNOSIS — N182 Chronic kidney disease, stage 2 (mild): Secondary | ICD-10-CM

## 2020-05-12 DIAGNOSIS — Z7901 Long term (current) use of anticoagulants: Secondary | ICD-10-CM

## 2020-05-12 DIAGNOSIS — I10 Essential (primary) hypertension: Secondary | ICD-10-CM

## 2020-05-12 DIAGNOSIS — I4891 Unspecified atrial fibrillation: Secondary | ICD-10-CM | POA: Diagnosis not present

## 2020-05-12 DIAGNOSIS — I5042 Chronic combined systolic (congestive) and diastolic (congestive) heart failure: Secondary | ICD-10-CM

## 2020-05-12 DIAGNOSIS — E782 Mixed hyperlipidemia: Secondary | ICD-10-CM | POA: Diagnosis not present

## 2020-05-13 LAB — COMPREHENSIVE METABOLIC PANEL
ALT: 6 IU/L (ref 0–32)
AST: 11 IU/L (ref 0–40)
Albumin/Globulin Ratio: 1.6 (ref 1.2–2.2)
Albumin: 4.1 g/dL (ref 3.7–4.7)
Alkaline Phosphatase: 84 IU/L (ref 44–121)
BUN/Creatinine Ratio: 11 — ABNORMAL LOW (ref 12–28)
BUN: 13 mg/dL (ref 8–27)
Bilirubin Total: 0.8 mg/dL (ref 0.0–1.2)
CO2: 22 mmol/L (ref 20–29)
Calcium: 9.2 mg/dL (ref 8.7–10.3)
Chloride: 107 mmol/L — ABNORMAL HIGH (ref 96–106)
Creatinine, Ser: 1.16 mg/dL — ABNORMAL HIGH (ref 0.57–1.00)
GFR calc Af Amer: 54 mL/min/{1.73_m2} — ABNORMAL LOW (ref >59)
GFR calc non Af Amer: 46 mL/min/{1.73_m2} — ABNORMAL LOW (ref >59)
Globulin, Total: 2.5 g/dL (ref 1.5–4.5)
Glucose: 94 mg/dL (ref 65–99)
Potassium: 4.3 mmol/L (ref 3.5–5.2)
Sodium: 142 mmol/L (ref 134–144)
Total Protein: 6.6 g/dL (ref 6.0–8.5)

## 2020-05-13 LAB — CBC
Hematocrit: 41.5 % (ref 34.0–46.6)
Hemoglobin: 13.9 g/dL (ref 11.1–15.9)
MCH: 31.2 pg (ref 26.6–33.0)
MCHC: 33.5 g/dL (ref 31.5–35.7)
MCV: 93 fL (ref 79–97)
Platelets: 259 10*3/uL (ref 150–450)
RBC: 4.45 x10E6/uL (ref 3.77–5.28)
RDW: 12.3 % (ref 11.7–15.4)
WBC: 6.7 10*3/uL (ref 3.4–10.8)

## 2020-05-13 LAB — LIPID PANEL
Chol/HDL Ratio: 4 ratio (ref 0.0–4.4)
Cholesterol, Total: 203 mg/dL — ABNORMAL HIGH (ref 100–199)
HDL: 51 mg/dL (ref >39)
LDL Chol Calc (NIH): 132 mg/dL — ABNORMAL HIGH (ref 0–99)
Triglycerides: 110 mg/dL (ref 0–149)
VLDL Cholesterol Cal: 20 mg/dL (ref 5–40)

## 2020-05-13 LAB — TSH: TSH: 5.14 u[IU]/mL — ABNORMAL HIGH (ref 0.450–4.500)

## 2020-05-13 LAB — PRO B NATRIURETIC PEPTIDE: NT-Pro BNP: 1712 pg/mL — ABNORMAL HIGH (ref 0–301)

## 2020-05-15 ENCOUNTER — Other Ambulatory Visit: Payer: Self-pay

## 2020-05-15 ENCOUNTER — Other Ambulatory Visit: Payer: Self-pay | Admitting: *Deleted

## 2020-05-15 ENCOUNTER — Ambulatory Visit (INDEPENDENT_AMBULATORY_CARE_PROVIDER_SITE_OTHER): Payer: Medicare Other | Admitting: Nurse Practitioner

## 2020-05-15 ENCOUNTER — Encounter: Payer: Self-pay | Admitting: Nurse Practitioner

## 2020-05-15 VITALS — BP 116/70 | HR 84 | Ht 61.0 in | Wt 227.0 lb

## 2020-05-15 DIAGNOSIS — N182 Chronic kidney disease, stage 2 (mild): Secondary | ICD-10-CM | POA: Diagnosis not present

## 2020-05-15 DIAGNOSIS — I5042 Chronic combined systolic (congestive) and diastolic (congestive) heart failure: Secondary | ICD-10-CM | POA: Diagnosis not present

## 2020-05-15 DIAGNOSIS — Z7901 Long term (current) use of anticoagulants: Secondary | ICD-10-CM | POA: Diagnosis not present

## 2020-05-15 DIAGNOSIS — I10 Essential (primary) hypertension: Secondary | ICD-10-CM

## 2020-05-15 DIAGNOSIS — E782 Mixed hyperlipidemia: Secondary | ICD-10-CM

## 2020-05-15 MED ORDER — ATORVASTATIN CALCIUM 10 MG PO TABS: 10.0000 mg | ORAL_TABLET | Freq: Every day | ORAL | 3 refills | Status: DC

## 2020-05-15 NOTE — Patient Instructions (Addendum)
After Visit Summary:  We will be checking the following labs today - None  Fasting labs in 6 weeks - BMET, lipids and LFTs   Medication Instructions:    Continue with your current medicines. BUT  I am going to start you on low dose statin for your cholesterol - Lipitor 10 mg a day - take each evening. This is at your pharmacy.   Increase your Lasix to 40 mg for 3 days only and then back to 20 mg a day  Talk with Dr. Damita Dunnings about your thyroid level   If you need a refill on your cardiac medications before your next appointment, please call your pharmacy.     Testing/Procedures To Be Arranged:  N/A  Follow-Up:   See Dr. Meda Coffee in 6 months - You will receive a reminder letter in the mail two months in advance. If you don't receive a letter, please call our office to schedule the follow-up appointment.     At [facility], you and your health needs are our priority.  As part of our continuing mission to provide you with exceptional heart care, we have created designated Provider Care Teams.  These Care Teams include your primary Cardiologist (physician) and Advanced Practice Providers (APPs -  Physician Assistants and Nurse Practitioners) who all work together to provide you with the care you need, when you need it.  Special Instructions:  . Stay safe, wash your hands for at least 20 seconds and wear a mask when needed.  . It was good to talk with you today.    Call the [place] office at [phone] if you have any questions, problems or concerns.

## 2020-05-17 ENCOUNTER — Telehealth: Payer: Self-pay | Admitting: Family Medicine

## 2020-05-17 DIAGNOSIS — R7989 Other specified abnormal findings of blood chemistry: Secondary | ICD-10-CM

## 2020-05-17 DIAGNOSIS — E039 Hypothyroidism, unspecified: Secondary | ICD-10-CM

## 2020-05-17 NOTE — Telephone Encounter (Signed)
Needs yearly visit here in 6 months.  Please schedule.  She had abnormal TSh, would recheck with next set of labs here in 6 months.  I put in the order.  Thanks.

## 2020-05-17 NOTE — Telephone Encounter (Signed)
Message sent thru MyChart 

## 2020-06-27 ENCOUNTER — Other Ambulatory Visit: Payer: Medicare Other

## 2020-07-04 ENCOUNTER — Other Ambulatory Visit: Payer: Self-pay

## 2020-07-04 ENCOUNTER — Other Ambulatory Visit: Payer: Medicare Other | Admitting: *Deleted

## 2020-07-04 DIAGNOSIS — E782 Mixed hyperlipidemia: Secondary | ICD-10-CM | POA: Diagnosis not present

## 2020-07-04 DIAGNOSIS — Z23 Encounter for immunization: Secondary | ICD-10-CM | POA: Diagnosis not present

## 2020-07-04 DIAGNOSIS — I5042 Chronic combined systolic (congestive) and diastolic (congestive) heart failure: Secondary | ICD-10-CM

## 2020-07-04 LAB — BASIC METABOLIC PANEL
BUN/Creatinine Ratio: 16 (ref 12–28)
BUN: 18 mg/dL (ref 8–27)
CO2: 23 mmol/L (ref 20–29)
Calcium: 9.6 mg/dL (ref 8.7–10.3)
Chloride: 102 mmol/L (ref 96–106)
Creatinine, Ser: 1.14 mg/dL — ABNORMAL HIGH (ref 0.57–1.00)
GFR calc Af Amer: 55 mL/min/{1.73_m2} — ABNORMAL LOW (ref >59)
GFR calc non Af Amer: 47 mL/min/{1.73_m2} — ABNORMAL LOW (ref >59)
Glucose: 96 mg/dL (ref 65–99)
Potassium: 4.7 mmol/L (ref 3.5–5.2)
Sodium: 138 mmol/L (ref 134–144)

## 2020-07-04 LAB — HEPATIC FUNCTION PANEL
ALT: 10 IU/L (ref 0–32)
AST: 16 IU/L (ref 0–40)
Albumin: 4.4 g/dL (ref 3.7–4.7)
Alkaline Phosphatase: 113 IU/L (ref 44–121)
Bilirubin Total: 1.1 mg/dL (ref 0.0–1.2)
Bilirubin, Direct: 0.25 mg/dL (ref 0.00–0.40)
Total Protein: 7.1 g/dL (ref 6.0–8.5)

## 2020-07-04 LAB — LIPID PANEL
Chol/HDL Ratio: 3.2 ratio (ref 0.0–4.4)
Cholesterol, Total: 149 mg/dL (ref 100–199)
HDL: 47 mg/dL (ref >39)
LDL Chol Calc (NIH): 84 mg/dL (ref 0–99)
Triglycerides: 95 mg/dL (ref 0–149)
VLDL Cholesterol Cal: 18 mg/dL (ref 5–40)

## 2020-08-07 ENCOUNTER — Other Ambulatory Visit: Payer: Self-pay | Admitting: Cardiology

## 2020-08-07 DIAGNOSIS — I5041 Acute combined systolic (congestive) and diastolic (congestive) heart failure: Secondary | ICD-10-CM

## 2020-08-07 DIAGNOSIS — I482 Chronic atrial fibrillation, unspecified: Secondary | ICD-10-CM

## 2020-08-07 DIAGNOSIS — I428 Other cardiomyopathies: Secondary | ICD-10-CM

## 2020-08-07 DIAGNOSIS — R0602 Shortness of breath: Secondary | ICD-10-CM

## 2020-08-09 ENCOUNTER — Telehealth: Payer: Self-pay | Admitting: Cardiology

## 2020-08-09 NOTE — Telephone Encounter (Signed)
*  STAT* If patient is at the pharmacy, call can be transferred to refill team.   1. Which medications need to be refilled? (please list name of each medication and dose if known) metoprolol   2. Which pharmacy/location (including street and city if local pharmacy) is medication to be sent to? [facility]   3. Do they need a 30 day or 90 day supply? [place]

## 2020-08-10 NOTE — Telephone Encounter (Signed)
Pt's medication was already sent to pt's pharmacy as requested on 08/08/20 with refills. Confirmation received.

## 2020-11-20 ENCOUNTER — Ambulatory Visit: Payer: Medicare Other | Admitting: Cardiology

## 2020-11-22 NOTE — Progress Notes (Deleted)
Cardiology Office Note:    Date:  11/22/2020   ID:  Natasha Dickson, DOB [birth year], MRN [id number]  PCP:  Tonia Ghent, MD   [place] Providers Cardiologist:  Ena Dawley, MD (Inactive) {  Referring MD: Tonia Ghent, MD   No chief complaint on file.   History of Present Illness:    Natasha Dickson is a 75 y.o. female with a hx of combined systolic and diastolic heart failure diagnosed in 2014 thought to be tachycardia induced CM in the setting of Afib with RVR, Afib, HTN, HLD, and CKD who was previously followed by Dr. Meda Coffee who now presents to clinic for follow-up.  Per review of the record, the patient has known history of Afib with RVR with TTE showing LVEF 30-35% with diffuse hypokinesis and her stress test was negative for ischemia.  Cardiomyopathy was believed to be secondary to tachycardia associated with atrial fibrillation. LVEF improved to normal 50-55% in 2015 but then decreased to 45-50% in 2016.     Past Medical History:  Diagnosis Date   Breast cancer ([place]) 1999   Stage I in left breast   Chronic combined systolic and diastolic CHF (congestive heart failure) ([place])    a. 02/2013 Echo: EF 30-35%, diff HK, biatrial enlargement. b. 2D echo 07/2014 showed mildly dilated LV, EF 45-50%, severely dilated left atrium and moderately dilated right atrium.   CKD (chronic kidney disease), stage II    Closed fracture of unspecified part of neck of femur 2011   Right.  Soreness when standing. (Dr. Durward Fortes)   Nonischemic cardiomyopathy [facility])    a. 02/2013 Lexi CL: No ischemia, prob attenuation vs scar, EF 40%-->Med Rx.   Persistent atrial fibrillation ([place])    a. 02/2013: s/p TEE and attempted cardioversion x 2-->failed-->rate controlled with bb/digoxin, Xarelto initiated.    Past Surgical History:  Procedure Laterality Date   CARDIOVERSION N/A 02/18/2013   Procedure: CARDIOVERSION;  Surgeon: Jolaine Artist, MD;  Location: [facility] ENDOSCOPY;  Service:  Cardiovascular;  Laterality: N/A;   CARDIOVERSION N/A 02/22/2013   Procedure: CARDIOVERSION;  Surgeon: Fay Records, MD;  Location: [facility] ENDOSCOPY;  Service: Cardiovascular;  Laterality: N/A;   MASTECTOMY  1999   Left breast with saline implant.   PARTIAL HIP ARTHROPLASTY Right    TEE WITHOUT CARDIOVERSION N/A 02/18/2013   Procedure: TRANSESOPHAGEAL ECHOCARDIOGRAM (TEE);  Surgeon: Jolaine Artist, MD;  Location: [facility] ENDOSCOPY;  Service: Cardiovascular;  Laterality: N/A;   tubal ligation     TUBAL LIGATION      Current Medications: No outpatient medications have been marked as taking for the 12/04/20 encounter (Appointment) with Freada Bergeron, MD.     Allergies:   Patient has no known allergies.   Social History   Socioeconomic History   Marital status: Married    Spouse name: Not on file   Number of children: Not on file   Years of education: Not on file   Highest education level: Not on file  Occupational History   Occupation: Scientist, water quality    Employer: [employer]  Tobacco Use   Smoking status: Never   Smokeless tobacco: Never  Vaping Use   Vaping Use: Never used  Substance and Sexual Activity   Alcohol use: Yes    Alcohol/week: 0.0 standard drinks    Comment: RARE   Drug use: No   Sexual activity: Never  Other Topics Concern   Not on file  Social History Narrative   High School grad.  Married 40+ years   Scientist, water quality at [employer] Strain: Not on file  Food Insecurity: Not on file  Transportation Needs: Not on file  Physical Activity: Not on file  Stress: Not on file  Social Connections: Not on file     Family History: The patient's ***family history includes COPD in her mother; Colon cancer in her father; Congenital heart disease in her son; Heart attack in her father; Heart disease in her brother and father; Hypertension in her father. There is no history of Stroke.  ROS:   Please see the history of  present illness.    *** All other systems reviewed and are negative.  EKGs/Labs/Other Studies Reviewed:    The following studies were reviewed today: TTE Aug 11, 2014 - Left ventricle: The cavity size was mildly dilated. Wall   thickness was normal. Systolic function was mildly reduced. The   estimated ejection fraction was in the range of 45% to 50%. - Left atrium: The atrium was severely dilated. - Right atrium: The atrium was moderately dilated. - Atrial septum: No defect or patent foramen ovale was identified.  EKG:  EKG is *** ordered today.  The ekg ordered today demonstrates ***  Recent Labs: 05/12/2020: Hemoglobin 13.9; NT-Pro BNP 1,712; Platelets 259; TSH 5.140 07/04/2020: ALT 10; BUN 18; Creatinine, Ser 1.14; Potassium 4.7; Sodium 138  Recent Lipid Panel    Component Value Date/Time   CHOL 149 07/04/2020 1041   TRIG 95 07/04/2020 1041   HDL 47 07/04/2020 1041   CHOLHDL 3.2 07/04/2020 1041   CHOLHDL 4.2 03/11/2016 0911   VLDL 26 03/11/2016 0911   LDLCALC 84 07/04/2020 1041   LDLDIRECT 137.1 03/29/2011 1016     Risk Assessment/Calculations:   {Does this patient have ATRIAL FIBRILLATION?[phone]}   Physical Exam:    VS:  There were no vitals taken for this visit.    Wt Readings from Last 3 Encounters:  05/15/20 227 lb (103 kg)  02/22/20 227 lb (103 kg)  01/20/20 227 lb (103 kg)     GEN: *** Well nourished, well developed in no acute distress HEENT: Normal NECK: No JVD; No carotid bruits LYMPHATICS: No lymphadenopathy CARDIAC: ***RRR, no murmurs, rubs, gallops RESPIRATORY:  Clear to auscultation without rales, wheezing or rhonchi  ABDOMEN: Soft, non-tender, non-distended MUSCULOSKELETAL:  No edema; No deformity  SKIN: Warm and dry NEUROLOGIC:  Alert and oriented x 3 PSYCHIATRIC:  Normal affect   ASSESSMENT:    No diagnosis found. PLAN:    In order of problems listed above:  #Chronic Combined Systolic and Diastolic HF: Thought to be secondary to  tachy induced CM in the setting of Afib with RVR. LVEF initially 30-35%->45-50%. Currently with NYHA class II symptoms. Euvolemic on exam. -Continue metop 100mg  XL daily -Continue lasix 20mg  prn for LE edema -Increase spironolactone to 25mg  daily  #Persistent Afib: -Continue metop 100mg  XL daily -Continue xarelto 20mg  daily -Continue dilt 180mg  daily  #HTN: -Continue metop 100mg  XL daily -Continue dilt 180mg  daily  #CKD: Cr stable on most recent labs. -Repeat BMET at next week on increased dose of spironolactone  #HLD: -Continue lipitor 10mg  daily    {Are you ordering a CV Procedure (e.g. stress test, cath, DCCV, TEE, etc)?   Press F2        :[id number]}    Medication Adjustments/Labs and Tests Ordered: Current medicines are reviewed at length with the patient today.  Concerns regarding medicines are outlined above.  No  orders of the defined types were placed in this encounter.  No orders of the defined types were placed in this encounter.   There are no Patient Instructions on file for this visit.   Signed, Freada Bergeron, MD  11/22/2020 2:55 PM    [place]

## 2020-12-04 ENCOUNTER — Ambulatory Visit (INDEPENDENT_AMBULATORY_CARE_PROVIDER_SITE_OTHER): Payer: Medicare Other | Admitting: Cardiology

## 2020-12-04 ENCOUNTER — Other Ambulatory Visit: Payer: Self-pay

## 2020-12-04 ENCOUNTER — Encounter: Payer: Self-pay | Admitting: Cardiology

## 2020-12-04 VITALS — BP 141/89 | HR 112 | Ht 61.0 in | Wt 224.2 lb

## 2020-12-04 DIAGNOSIS — I10 Essential (primary) hypertension: Secondary | ICD-10-CM

## 2020-12-04 DIAGNOSIS — E782 Mixed hyperlipidemia: Secondary | ICD-10-CM | POA: Diagnosis not present

## 2020-12-04 DIAGNOSIS — Z79899 Other long term (current) drug therapy: Secondary | ICD-10-CM | POA: Diagnosis not present

## 2020-12-04 DIAGNOSIS — I5042 Chronic combined systolic (congestive) and diastolic (congestive) heart failure: Secondary | ICD-10-CM | POA: Diagnosis not present

## 2020-12-04 DIAGNOSIS — N182 Chronic kidney disease, stage 2 (mild): Secondary | ICD-10-CM

## 2020-12-04 MED ORDER — SPIRONOLACTONE 25 MG PO TABS: 25.0000 mg | ORAL_TABLET | Freq: Every day | ORAL | 2 refills | Status: DC

## 2020-12-04 MED ORDER — RIVAROXABAN 20 MG PO TABS
20.0000 mg | ORAL_TABLET | Freq: Every day | ORAL | 3 refills | Status: DC
Start: 2020-12-04 — End: 2021-04-13

## 2020-12-04 NOTE — Patient Instructions (Signed)
Medication Instructions:   INCREASE YOUR SPIRONOLACTONE TO 25 MG BY MOUTH DAILY  *If you need a refill on your cardiac medications before your next appointment, please call your pharmacy*   Lab Work:  IN ONE WEEK HERE IN THE OFFICE--WE WILL CHECK A BMET AT THAT TIME.  If you have labs (blood work) drawn today and your tests are completely normal, you will receive your results only by: [place] (if you have MyChart) OR A paper copy in the mail If you have any lab test that is abnormal or we need to change your treatment, we will call you to review the results.   Follow-Up:  8 MONTHS IN THE OFFICE WITH DR. Johney Frame OR AN EXTENDER

## 2020-12-04 NOTE — Progress Notes (Signed)
Cardiology Office Note:    Date:  12/04/2020   ID:  Natasha Dickson, DOB 03-27-1946, MRN [id number]  PCP:  Natasha Ghent, MD   [facility] Cardiologist:  Natasha Dawley, MD (Inactive) {  Referring MD: Natasha Ghent, MD   No chief complaint on file.   History of Present Illness:    Natasha Dickson is a 75 y.o. female with a hx of combined systolic and diastolic heart failure diagnosed in 2014 thought to be tachycardia induced CM in the setting of Afib with RVR, Afib, HTN, HLD, and CKD who was previously followed by Dr. Meda Coffee who now presents to clinic for follow-up.  Per review of the record, the patient has known history of Afib with RVR with TTE showing LVEF 30-35% with diffuse hypokinesis and her stress test was negative for ischemia.  Cardiomyopathy was believed to be secondary to tachycardia associated with atrial fibrillation. LVEF improved to normal 50-55% in 2015 but then decreased to 45-50% in 2016.  Today, she is feeling okay overall. She reports that her LE edema "comes and goes," and she Dickson take Lasix if she eats salty foods. Typically she does not wear compression socks. Otherwise, she is doing well. She is constantly in Afib and only notes palpitations with significant exertion, which resolve with rest. Occasionally she feels lightheaded, particularly with sudden movement.   At home, her blood pressure is averaging 120s/80-90s. She remains compliant with Xarelto and has no bleeding issues. Her co-pay has increased but it is still affordable at this time. She confirms no known allergies to medications.  She denies any chest pain, headaches, or syncope. Also has no orthopnea or PND.  Our office follows her kidney function every 6 months.  Past Medical History:  Diagnosis Date   Breast cancer ([place]) 1999   Stage I in left breast   Chronic combined systolic and diastolic CHF (congestive heart failure) ([place])    a. 02/2013 Echo: EF 30-35%, diff HK, biatrial  enlargement. b. 2D echo 07/2014 showed mildly dilated LV, EF 45-50%, severely dilated left atrium and moderately dilated right atrium.   CKD (chronic kidney disease), stage II    Closed fracture of unspecified part of neck of femur 2011   Right.  Soreness when standing. (Dr. Durward Fortes)   Nonischemic cardiomyopathy [facility])    a. 02/2013 Lexi CL: No ischemia, prob attenuation vs scar, EF 40%-->Med Rx.   Persistent atrial fibrillation ([place])    a. 02/2013: s/p TEE and attempted cardioversion x 2-->failed-->rate controlled with bb/digoxin, Xarelto initiated.    Past Surgical History:  Procedure Laterality Date   CARDIOVERSION N/A 02/18/2013   Procedure: CARDIOVERSION;  Surgeon: Jolaine Artist, MD;  Location: [facility] ENDOSCOPY;  Service: Cardiovascular;  Laterality: N/A;   CARDIOVERSION N/A 02/22/2013   Procedure: CARDIOVERSION;  Surgeon: Fay Records, MD;  Location: [facility] ENDOSCOPY;  Service: Cardiovascular;  Laterality: N/A;   MASTECTOMY  1999   Left breast with saline implant.   PARTIAL HIP ARTHROPLASTY Right    TEE WITHOUT CARDIOVERSION N/A 02/18/2013   Procedure: TRANSESOPHAGEAL ECHOCARDIOGRAM (TEE);  Surgeon: Jolaine Artist, MD;  Location: [facility] ENDOSCOPY;  Service: Cardiovascular;  Laterality: N/A;   tubal ligation     TUBAL LIGATION      Current Medications: Current Meds  Medication Sig   diltiazem (CARDIZEM CD) 180 MG 24 hr capsule Take 1 capsule (180 mg total) by mouth daily.   furosemide (LASIX) 20 MG tablet Take 1 tablet (20 mg total) by  mouth daily.   metoprolol succinate (TOPROL-XL) 100 MG 24 hr tablet TAKE 1 TABLET BY MOUTH ONCE DAILY WITH MEALS OR  IMMEDIATELY  AFTER  A  MEAL   nitroGLYCERIN (NITROSTAT) 0.4 MG SL tablet DISSOLVE ONE TABLET UNDER THE TONGUE EVERY 5 MINUTES AS NEEDED FOR CHEST PAIN.  DO NOT EXCEED A TOTAL OF 3 DOSES IN 15 MINUTES   spironolactone (ALDACTONE) 25 MG tablet Take 1 tablet (25 mg total) by mouth daily.   traMADol (ULTRAM) 50 MG tablet Take 1 tablet (50 mg  total) by mouth every 8 (eight) hours as needed.   [DISCONTINUED] spironolactone (ALDACTONE) 25 MG tablet Take 0.5 tablets (12.5 mg total) by mouth daily.   [DISCONTINUED] XARELTO 20 MG TABS tablet Take 1 tablet by mouth once daily     Allergies:   Patient has no known allergies.   Social History   Socioeconomic History   Marital status: Married    Spouse name: Not on file   Number of children: Not on file   Years of education: Not on file   Highest education level: Not on file  Occupational History   Occupation: Scientist, water quality    Employer: [employer]  Tobacco Use   Smoking status: Never   Smokeless tobacco: Never  Vaping Use   Vaping Use: Never used  Substance and Sexual Activity   Alcohol use: Yes    Alcohol/week: 0.0 standard drinks    Comment: RARE   Drug use: No   Sexual activity: Never  Other Topics Concern   Not on file  Social History Narrative   High School grad.   Married 40+ years   Scientist, water quality at [place] Strain: Not on file  Food Insecurity: Not on file  Transportation Needs: Not on file  Physical Activity: Not on file  Stress: Not on file  Social Connections: Not on file     Family History: The patient's family history includes COPD in her mother; Colon cancer in her father; Congenital heart disease in her son; Heart attack in her father; Heart disease in her brother and father; Hypertension in her father. There is no history of Stroke.  ROS:   Review of Systems  Constitutional:  Negative for diaphoresis and malaise/fatigue.  HENT:  Negative for ear pain and nosebleeds.   Eyes:  Negative for pain and redness.  Respiratory:  Positive for shortness of breath. Negative for cough and wheezing.   Cardiovascular:  Positive for palpitations and leg swelling. Negative for chest pain, orthopnea, claudication and PND.  Gastrointestinal:  Negative for abdominal pain and diarrhea.  Genitourinary:  Negative  for flank pain and frequency.  Musculoskeletal:  Negative for joint pain and myalgias.  Neurological:  Positive for dizziness. Negative for headaches.  Endo/Heme/Allergies:  Does not bruise/bleed easily.  Psychiatric/Behavioral:  Negative for depression. The patient is not nervous/anxious.     EKGs/Labs/Other Studies Reviewed:    The following studies were reviewed today: TTE Aug 06, 2014 - Left ventricle: The cavity size was mildly dilated. Wall   thickness was normal. Systolic function was mildly reduced. The   estimated ejection fraction was in the range of 45% to 50%. - Left atrium: The atrium was severely dilated. - Right atrium: The atrium was moderately dilated. - Atrial septum: No defect or patent foramen ovale was identified.  EKG:   12/04/2020: Atrial fibrillation, rate 112  Recent Labs: 05/12/2020: Hemoglobin 13.9; NT-Pro BNP 1,712; Platelets 259; TSH 5.140 07/04/2020: ALT  10; BUN 18; Creatinine, Ser 1.14; Potassium 4.7; Sodium 138  Recent Lipid Panel    Component Value Date/Time   CHOL 149 07/04/2020 1041   TRIG 95 07/04/2020 1041   HDL 47 07/04/2020 1041   CHOLHDL 3.2 07/04/2020 1041   CHOLHDL 4.2 03/11/2016 0911   VLDL 26 03/11/2016 0911   LDLCALC 84 07/04/2020 1041   LDLDIRECT 137.1 03/29/2011 1016     Risk Assessment/Calculations:    CHA2DS2-VASc Score = 5  This indicates a 7.2% annual risk of stroke. The patient's score is based upon: CHF History: Yes HTN History: Yes Diabetes History: No Stroke History: No Vascular Disease History: Yes Age Score: 1 Gender Score: 1     Physical Exam:    VS:  BP (!) 141/89   Pulse (!) 112   Ht 5\' 1"  (1.549 m)   Wt 224 lb 3.2 oz (101.7 kg)   SpO2 97%   BMI 42.36 kg/m     Wt Readings from Last 3 Encounters:  12/04/20 224 lb 3.2 oz (101.7 kg)  05/15/20 227 lb (103 kg)  02/22/20 227 lb (103 kg)     GEN: Comfortable, NAD HEENT: Normal NECK: No JVD; No carotid bruits CARDIAC: RRR, no murmurs, rubs,  gallops RESPIRATORY:  Clear to auscultation without rales, wheezing or rhonchi  ABDOMEN: Soft, non-tender, non-distended MUSCULOSKELETAL:  Trace bilateral LE edema to the mid-shins with chronic venous stasis changes. Warm  SKIN: Warm and dry NEUROLOGIC:  Alert and oriented x 3 PSYCHIATRIC:  Normal affect   ASSESSMENT:    1. Chronic combined systolic and diastolic CHF (congestive heart failure) ([place])   2. Chronic combined systolic and diastolic CHF, NYHA class 2 (HCC)   3. Stage 2 chronic kidney disease   4. Essential hypertension   5. Medication management   6. Mixed hyperlipidemia    PLAN:    In order of problems listed above:  #Chronic Combined Systolic and Diastolic HF: Thought to be secondary to tachy induced CM in the setting of Afib with RVR. LVEF initially 30-35%->45-50%. Currently with NYHA class II symptoms. Mild LE edema on exam which improves with compression socks and lasix.  -Continue metop 100mg  XL daily -Continue lasix 20mg  prn for LE edema -Increase spironolactone to 25mg  daily -Dickson add losartan as tolerated at next visit -Low Na diet -Repeat BMET next week  #Persistent Afib: CHADs-vasc 5. Pursuing rate control. Overall well controlled at home.  -Continue metop 100mg  XL daily -Continue xarelto 20mg  daily -Continue dilt 180mg  daily  #HTN: Well controlled at home running 120/80s. -Continue metop 100mg  XL daily -Continue dilt 180mg  daily -Continue spironolactone 25mg  daily  #CKD Stage II: Cr stable on most recent labs. -Repeat BMET at next week on increased dose of spironolactone  #HLD: -Continue lipitor 10mg  daily  Follow-up in 6-8 months.  Medication Adjustments/Labs and Tests Ordered: Current medicines are reviewed at length with the patient today.  Concerns regarding medicines are outlined above.  Orders Placed This Encounter  Procedures   Basic metabolic panel   EKG 82-UMPN   Meds ordered this encounter  Medications   spironolactone  (ALDACTONE) 25 MG tablet    Sig: Take 1 tablet (25 mg total) by mouth daily.    Dispense:  90 tablet    Refill:  2    Dose increase   rivaroxaban (XARELTO) 20 MG TABS tablet    Sig: Take 1 tablet (20 mg total) by mouth daily.    Dispense:  30 tablet    Refill:  3    Patient Instructions  Medication Instructions:   INCREASE YOUR SPIRONOLACTONE TO 25 MG BY MOUTH DAILY  *If you need a refill on your cardiac medications before your next appointment, please call your pharmacy*   Lab Work:  IN ONE WEEK HERE IN THE OFFICE--WE Dickson CHECK A BMET AT THAT TIME.  If you have labs (blood work) drawn today and your tests are completely normal, you Dickson receive your results only by: [place] (if you have MyChart) OR A paper copy in the mail If you have any lab test that is abnormal or we need to change your treatment, we Dickson call you to review the results.   Follow-Up:  8 MONTHS IN THE OFFICE WITH DR. Johney Frame OR AN EXTENDER     I,Mathew Stumpf,acting as a scribe for Freada Bergeron, MD.,have documented all relevant documentation on the behalf of Freada Bergeron, MD,as directed by  Freada Bergeron, MD while in the presence of Freada Bergeron, MD.  I, Freada Bergeron, MD, have reviewed all documentation for this visit. The documentation on 12/04/20 for the exam, diagnosis, procedures, and orders are all accurate and complete.   Signed, Freada Bergeron, MD  12/04/2020 12:17 PM    [facility]

## 2020-12-13 ENCOUNTER — Telehealth: Payer: Self-pay | Admitting: *Deleted

## 2020-12-13 NOTE — Telephone Encounter (Signed)
Noted. Thanks. I'll defer to patient in the meantime.

## 2020-12-13 NOTE — Telephone Encounter (Signed)
Tried to call patient back to confirm that she is in quarantine and got her voicemail. Left a message for patient to call the office back.

## 2020-12-13 NOTE — Telephone Encounter (Signed)
Patient called stating that she is at the beach and her daughter has done a covid test on her and it is positive. Patient stated that she thinks that she was exposed Saturday. Patient stated that she started with symptoms Sunday. Patient stated that she has had a low grade fever, cough, runny nose, chills and headache. Patient was advised if she wants to consider an antiviral medication we need to get her scheduled for a virtual visit. Patient was advised that we can schedule a virtual visit in the morning with a provider. Patient stated that she doesn't really feel that she needs a virtual visit. Patient denies SOB or difficulty breathing. Patient was advised that she is overdue a physical with Dr. Damita Dunnings. Patient stated that she sees cardiology. Patient was advised that her PCP does require that she see him at least once a year. Patient stated that she will call back and schedule an appointment but she is at the beach now.

## 2020-12-19 ENCOUNTER — Other Ambulatory Visit: Payer: Medicare Other

## 2020-12-20 ENCOUNTER — Other Ambulatory Visit: Payer: Medicare Other | Admitting: *Deleted

## 2020-12-20 ENCOUNTER — Other Ambulatory Visit: Payer: Self-pay

## 2020-12-20 DIAGNOSIS — N182 Chronic kidney disease, stage 2 (mild): Secondary | ICD-10-CM | POA: Diagnosis not present

## 2020-12-20 DIAGNOSIS — I10 Essential (primary) hypertension: Secondary | ICD-10-CM

## 2020-12-20 DIAGNOSIS — I5042 Chronic combined systolic (congestive) and diastolic (congestive) heart failure: Secondary | ICD-10-CM

## 2020-12-20 DIAGNOSIS — Z79899 Other long term (current) drug therapy: Secondary | ICD-10-CM | POA: Diagnosis not present

## 2020-12-20 LAB — BASIC METABOLIC PANEL
BUN/Creatinine Ratio: 14 (ref 12–28)
BUN: 16 mg/dL (ref 8–27)
CO2: 20 mmol/L (ref 20–29)
Calcium: 9 mg/dL (ref 8.7–10.3)
Chloride: 104 mmol/L (ref 96–106)
Creatinine, Ser: 1.15 mg/dL — ABNORMAL HIGH (ref 0.57–1.00)
Glucose: 88 mg/dL (ref 65–99)
Potassium: 4.1 mmol/L (ref 3.5–5.2)
Sodium: 141 mmol/L (ref 134–144)
eGFR: 50 mL/min/{1.73_m2} — ABNORMAL LOW (ref >59)

## 2020-12-25 ENCOUNTER — Ambulatory Visit: Payer: Medicare Other | Admitting: Family Medicine

## 2020-12-25 ENCOUNTER — Telehealth: Payer: Self-pay | Admitting: Family Medicine

## 2020-12-25 NOTE — Telephone Encounter (Signed)
Attempted to reach patient to reschedule appt from today. At check in, pt stated she tested positive for Covid on 7/12. She was sent back to the car and advised we would contact her to reschedule. When I tried to reach her, there was no answer. I left a voicemail asking her to call the office. Pt needs to reschedule after the 10 day mark as long as she is not having any symptoms.

## 2021-01-01 ENCOUNTER — Other Ambulatory Visit: Payer: Self-pay | Admitting: Family Medicine

## 2021-01-01 ENCOUNTER — Ambulatory Visit (INDEPENDENT_AMBULATORY_CARE_PROVIDER_SITE_OTHER): Payer: Medicare Other | Admitting: Family Medicine

## 2021-01-01 ENCOUNTER — Encounter: Payer: Self-pay | Admitting: Family Medicine

## 2021-01-01 ENCOUNTER — Other Ambulatory Visit: Payer: Self-pay

## 2021-01-01 DIAGNOSIS — M545 Low back pain, unspecified: Secondary | ICD-10-CM | POA: Insufficient documentation

## 2021-01-01 MED ORDER — CYCLOBENZAPRINE HCL 10 MG PO TABS: 5.0000 mg | ORAL_TABLET | Freq: Three times a day (TID) | ORAL | 1 refills | Status: DC | PRN

## 2021-01-01 MED ORDER — TRAMADOL HCL 50 MG PO TABS: 50.0000 mg | ORAL_TABLET | Freq: Three times a day (TID) | ORAL | 1 refills | Status: DC | PRN

## 2021-01-01 NOTE — Progress Notes (Signed)
This visit occurred during the SARS-CoV-2 public health emergency.  Safety protocols were in place, including screening questions prior to the visit, additional usage of staff PPE, and extensive cleaning of exam room while observing appropriate contact time as indicated for disinfecting solutions.  Back pain.  Fell at home.  This was 12/22/20.  No LOC.  "For a couple of days it was painful but bearable.  Then is settled more in my R lower back."  Had some leftover flexeril and tramadol, slept better with less pain with use.  No ADE on med.    Mechanism d/w pt, she had gone into bedroom, she bumped into husband and then sat down.  Safe at home.  D/w pt.  He helped her up.  D/w pt about having a fall button.  Information given to patient.  Offered PT, declined by patient.    No radicular pain.  No :FCNAVD. No B/B sx.  No foot drop.    Meds, vitals, and allergies reviewed.   ROS: Per HPI unless specifically indicated in ROS section   GEN: nad, alert and oriented HEENT: ncat NECK: supple w/o LA CV: IRR, not tachy PULM: ctab, no inc wob ABD: soft, +bs EXT: 1+ BLE edema SKIN: no acute rash R SLR neg.   R lower back ttp but no midline pain.  L side not ttp.

## 2021-01-01 NOTE — Patient Instructions (Signed)
Use flexeril and tramadol as needed.  Gently stretch and keep moving. Update me as needed.  Take care.  Glad to see you.

## 2021-01-01 NOTE — Assessment & Plan Note (Signed)
Fall button info given to patient.  Declined PT.  Use flexeril and tramadol as needed.  Gently stretch and keep moving. Update me as needed.  She agrees with plan.  No need for imaging now.  She is improving.  Med cautions d/w pt.

## 2021-01-19 ENCOUNTER — Telehealth: Payer: Self-pay | Admitting: Cardiology

## 2021-01-19 NOTE — Telephone Encounter (Signed)
*  STAT* If patient is at the pharmacy, call can be transferred to refill team.   1. Which medications need to be refilled? (please list name of each medication and dose if known)need a new prescription for Diltiazem **  2. Which pharmacy/location (including street and city if local pharmacy) is medication to be sent to? [place]  3. Do they need a 30 day or 90 day supply? 90 days and refills

## 2021-01-22 MED ORDER — DILTIAZEM HCL ER COATED BEADS 180 MG PO CP24: 180.0000 mg | ORAL_CAPSULE | Freq: Every day | ORAL | 3 refills | Status: DC

## 2021-01-22 NOTE — Telephone Encounter (Signed)
Refill for Diltiazem has been sent to [facility], per pt's request.

## 2021-03-28 DIAGNOSIS — Z1152 Encounter for screening for COVID-19: Secondary | ICD-10-CM | POA: Diagnosis not present

## 2021-04-13 ENCOUNTER — Other Ambulatory Visit: Payer: Self-pay

## 2021-04-13 ENCOUNTER — Other Ambulatory Visit: Payer: Self-pay | Admitting: *Deleted

## 2021-04-13 MED ORDER — RIVAROXABAN 20 MG PO TABS: 20.0000 mg | ORAL_TABLET | Freq: Every day | ORAL | 3 refills | Status: DC

## 2021-04-13 NOTE — Telephone Encounter (Signed)
Prescription refill request for Xarelto received.  Indication: Afib  Last office visit: 12/04/20 Johney Frame)  Weight: 100.7kg Age: 75 Scr: 1.15 (12/20/20) CrCl: 67.79ml/min  Appropriate dose and refill sent to requested pharmacy.

## 2021-05-22 DIAGNOSIS — Z23 Encounter for immunization: Secondary | ICD-10-CM | POA: Diagnosis not present

## 2021-06-10 DIAGNOSIS — C189 Malignant neoplasm of colon, unspecified: Secondary | ICD-10-CM

## 2021-06-10 HISTORY — DX: Malignant neoplasm of colon, unspecified: C18.9

## 2021-08-13 ENCOUNTER — Other Ambulatory Visit: Payer: Self-pay

## 2021-08-13 MED ORDER — ATORVASTATIN CALCIUM 10 MG PO TABS: 10.0000 mg | ORAL_TABLET | Freq: Every day | ORAL | 0 refills | Status: DC

## 2021-09-17 ENCOUNTER — Other Ambulatory Visit: Payer: Self-pay

## 2021-09-17 DIAGNOSIS — R0602 Shortness of breath: Secondary | ICD-10-CM

## 2021-09-17 DIAGNOSIS — I482 Chronic atrial fibrillation, unspecified: Secondary | ICD-10-CM

## 2021-09-17 DIAGNOSIS — I5041 Acute combined systolic (congestive) and diastolic (congestive) heart failure: Secondary | ICD-10-CM

## 2021-09-17 DIAGNOSIS — I428 Other cardiomyopathies: Secondary | ICD-10-CM

## 2021-09-17 MED ORDER — METOPROLOL SUCCINATE ER 100 MG PO TB24
ORAL_TABLET | ORAL | 0 refills | Status: DC
Start: 2021-09-17 — End: 2022-02-26

## 2021-09-28 NOTE — Progress Notes (Deleted)
?Cardiology Office Note:   ? ?Date:  09/28/2021  ? ?ID:  Natasha Dickson, DOB 12-May-1946, MRN [id number] ? ?PCP:  Tonia Ghent, MD ?  ?[facility] ?Cardiologist:  Ena Dawley, MD { ? ?Referring MD: Tonia Ghent, MD  ? ?No chief complaint on file. ? ? ?History of Present Illness:   ? ?Natasha Dickson is a 76 y.o. female with a hx of combined systolic and diastolic heart failure diagnosed in 2014 thought to be tachycardia induced CM in the setting of Afib with RVR, Afib, HTN, HLD, and CKD who was previously followed by Dr. Meda Coffee who now presents to clinic for follow-up. ? ?Per review of the record, the patient has known history of Afib with RVR with TTE showing LVEF 30-35% with diffuse hypokinesis and her stress test was negative for ischemia.  Cardiomyopathy was believed to be secondary to tachycardia associated with atrial fibrillation. LVEF improved to normal 50-55% in 2015 but then decreased to 45-50% in 2016. ? ?Was last seen in clinic on 11/2020 where she was doing well from a CV standpoint. Was tolerating xarelto without issues. ? ?Today, *** ? ?Past Medical History:  ?Diagnosis Date  ? Breast cancer ([place]) 1999  ? Stage I in left breast  ? Chronic combined systolic and diastolic CHF (congestive heart failure) ([place])   ? a. 02/2013 Echo: EF 30-35%, diff HK, biatrial enlargement. b. 2D echo 07/2014 showed mildly dilated LV, EF 45-50%, severely dilated left atrium and moderately dilated right atrium.  ? CKD (chronic kidney disease), stage II   ? Closed fracture of unspecified part of neck of femur 2011  ? Right.  Soreness when standing. (Dr. Durward Fortes)  ? Nonischemic cardiomyopathy ([place])   ? a. 02/2013 Lexi CL: No ischemia, prob attenuation vs scar, EF 40%-->Med Rx.  ? Persistent atrial fibrillation ([place])   ? a. 02/2013: s/p TEE and attempted cardioversion x 2-->failed-->rate controlled with bb/digoxin, Xarelto initiated.  ? ? ?Past Surgical History:  ?Procedure Laterality Date  ? CARDIOVERSION N/A  02/18/2013  ? Procedure: CARDIOVERSION;  Surgeon: Jolaine Artist, MD;  Location: [facility];  Service: Cardiovascular;  Laterality: N/A;  ? CARDIOVERSION N/A 02/22/2013  ? Procedure: CARDIOVERSION;  Surgeon: Fay Records, MD;  Location: [facility];  Service: Cardiovascular;  Laterality: N/A;  ? MASTECTOMY  1999  ? Left breast with saline implant.  ? PARTIAL HIP ARTHROPLASTY Right   ? TEE WITHOUT CARDIOVERSION N/A 02/18/2013  ? Procedure: TRANSESOPHAGEAL ECHOCARDIOGRAM (TEE);  Surgeon: Jolaine Artist, MD;  Location: [facility] ENDOSCOPY;  Service: Cardiovascular;  Laterality: N/A;  ? tubal ligation    ? TUBAL LIGATION    ? ? ?Current Medications: ?No outpatient medications have been marked as taking for the 10/03/21 encounter (Appointment) with Freada Bergeron, MD.  ?  ? ?Allergies:   Patient has no known allergies.  ? ?Social History  ? ?Socioeconomic History  ? Marital status: Married  ?  Spouse name: Not on file  ? Number of children: Not on file  ? Years of education: Not on file  ? Highest education level: Not on file  ?Occupational History  ? Occupation: Scientist, water quality  ?  Employer: [employer]  ?Tobacco Use  ? Smoking status: Never  ? Smokeless tobacco: Never  ?Vaping Use  ? Vaping Use: Never used  ?Substance and Sexual Activity  ? Alcohol use: Yes  ?  Alcohol/week: 0.0 standard drinks  ?  Comment: RARE  ? Drug use: No  ? Sexual  activity: Never  ?Other Topics Concern  ? Not on file  ?Social History Narrative  ? High School grad.  ? Married 40+ years  ? Cashier at [employer]  ? ?Social Determinants of Health  ? ?Financial Resource Strain: Not on file  ?Food Insecurity: Not on file  ?Transportation Needs: Not on file  ?Physical Activity: Not on file  ?Stress: Not on file  ?Social Connections: Not on file  ?  ? ?Family History: ?The patient's family history includes COPD in her mother; Colon cancer in her father; Congenital heart disease in her son; Heart attack in her father; Heart disease in her brother and  father; Hypertension in her father. There is no history of Stroke. ? ?ROS:   ?Review of Systems  ?Constitutional:  Negative for diaphoresis and malaise/fatigue.  ?HENT:  Negative for ear pain and nosebleeds.   ?Eyes:  Negative for pain and redness.  ?Respiratory:  Positive for shortness of breath. Negative for cough and wheezing.   ?Cardiovascular:  Positive for palpitations and leg swelling. Negative for chest pain, orthopnea, claudication and PND.  ?Gastrointestinal:  Negative for abdominal pain and diarrhea.  ?Genitourinary:  Negative for flank pain and frequency.  ?Musculoskeletal:  Negative for joint pain and myalgias.  ?Neurological:  Positive for dizziness. Negative for headaches.  ?Endo/Heme/Allergies:  Does not bruise/bleed easily.  ?Psychiatric/Behavioral:  Negative for depression. The patient is not nervous/anxious.   ? ? ?EKGs/Labs/Other Studies Reviewed:   ? ?The following studies were reviewed today: ?TTE 08/05/2014 ?- Left ventricle: The cavity size was mildly dilated. Wall ?  thickness was normal. Systolic function was mildly reduced. The ?  estimated ejection fraction was in the range of 45% to 50%. ?- Left atrium: The atrium was severely dilated. ?- Right atrium: The atrium was moderately dilated. ?- Atrial septum: No defect or patent foramen ovale was identified. ? ?EKG:   ?12/04/2020: Atrial fibrillation, rate 112 ? ?Recent Labs: ?12/20/2020: BUN 16; Creatinine, Ser 1.15; Potassium 4.1; Sodium 141  ?Recent Lipid Panel ?   ?Component Value Date/Time  ? CHOL 149 07/04/2020 1041  ? TRIG 95 07/04/2020 1041  ? HDL 47 07/04/2020 1041  ? CHOLHDL 3.2 07/04/2020 1041  ? CHOLHDL 4.2 03/11/2016 0911  ? VLDL 26 03/11/2016 0911  ? [place] 84 07/04/2020 1041  ? LDLDIRECT 137.1 03/29/2011 1016  ? ? ? ?Risk Assessment/Calculations:   ? ?CHA2DS2-VASc Score = 5  ?This indicates a 7.2% annual risk of stroke. ?The patient's score is based upon: ?CHF History: 1 ?HTN History: 1 ?Diabetes History: 0 ?Stroke History:  0 ?Vascular Disease History: 1 ?Age Score: 1 ?Gender Score: 1 ?  ? ? ? ?Physical Exam:   ? ?VS:  There were no vitals taken for this visit.   ? ?Wt Readings from Last 3 Encounters:  ?01/01/21 222 lb (100.7 kg)  ?12/04/20 224 lb 3.2 oz (101.7 kg)  ?05/15/20 227 lb (103 kg)  ?  ? ?GEN: Comfortable, NAD ?HEENT: Normal ?NECK: No JVD; No carotid bruits ?CARDIAC: RRR, no murmurs, rubs, gallops ?RESPIRATORY:  Clear to auscultation without rales, wheezing or rhonchi  ?ABDOMEN: Soft, non-tender, non-distended ?MUSCULOSKELETAL:  Trace bilateral LE edema to the mid-shins with chronic venous stasis changes. Warm  ?SKIN: Warm and dry ?NEUROLOGIC:  Alert and oriented x 3 ?PSYCHIATRIC:  Normal affect  ? ?ASSESSMENT:   ? ?No diagnosis found. ? ?PLAN:   ? ?In order of problems listed above: ? ?#Chronic Combined Systolic and Diastolic HF: ?Thought to be secondary to tachy  induced CM in the setting of Afib with RVR. LVEF initially 30-35%->45-50%. Currently with NYHA class II symptoms. Mild LE edema on exam which improves with compression socks and lasix.  ?-Continue metop '100mg'$  XL daily ?-Continue lasix '20mg'$  prn for LE edema ?-Continue spironolactone '25mg'$  daily ?-Will add losartan as tolerated at next visit ?-Low Na diet ?-Repeat BMET next week ? ?#Persistent Afib: ?CHADs-vasc 5. Pursuing rate control. Overall well controlled at home.  ?-Continue metop '100mg'$  XL daily ?-Continue xarelto '20mg'$  daily ?-Continue dilt '180mg'$  daily ? ?#HTN: ?Well controlled at home running 120/80s. ?-Continue metop '100mg'$  XL daily ?-Continue dilt '180mg'$  daily ?-Continue spironolactone '25mg'$  daily ? ?#CKD Stage II: ?Cr stable on most recent labs. ? ?#HLD: ?-Continue lipitor '10mg'$  daily ? ?Follow-up in 6-8 months. ? ?Medication Adjustments/Labs and Tests Ordered: ?Current medicines are reviewed at length with the patient today.  Concerns regarding medicines are outlined above.  ?No orders of the defined types were placed in this encounter. ? ?No orders of the  defined types were placed in this encounter. ? ? ?There are no Patient Instructions on file for this visit. ?  ? ?I,Mathew Stumpf,acting as a scribe for Freada Bergeron, MD.,have documented all relevant doc

## 2021-10-03 ENCOUNTER — Encounter: Payer: Self-pay | Admitting: Cardiology

## 2021-10-03 ENCOUNTER — Ambulatory Visit (INDEPENDENT_AMBULATORY_CARE_PROVIDER_SITE_OTHER): Payer: Medicare Other | Admitting: Cardiology

## 2021-10-03 VITALS — BP 118/70 | HR 109 | Ht 61.0 in | Wt 218.8 lb

## 2021-10-03 DIAGNOSIS — E782 Mixed hyperlipidemia: Secondary | ICD-10-CM | POA: Diagnosis not present

## 2021-10-03 DIAGNOSIS — Z79899 Other long term (current) drug therapy: Secondary | ICD-10-CM

## 2021-10-03 DIAGNOSIS — I10 Essential (primary) hypertension: Secondary | ICD-10-CM | POA: Diagnosis not present

## 2021-10-03 MED ORDER — LOSARTAN POTASSIUM 25 MG PO TABS
12.5000 mg | ORAL_TABLET | Freq: Every day | ORAL | 3 refills | Status: DC
Start: 2021-10-03 — End: 2022-05-24

## 2021-10-03 NOTE — Patient Instructions (Signed)
Medication Instructions:  ? ?START TAKING LOSARTAN 12.5 MG BY MOUTH DAILY IN THE MORNING TIME. ?  ?PLEASE TAKE YOUR SPIRONOLACTONE IN THE MORNING TIME ? ?*If you need a refill on your cardiac medications before your next appointment, please call your pharmacy* ? ? ?Lab Work: ? ?[place] OFFICE--BMET AND LIPIDS--PLEASE COME FASTING  ? ?If you have labs (blood work) drawn today and your tests are completely normal, you will receive your results only by: ?MyChart Message (if you have MyChart) OR ?A paper copy in the mail ?If you have any lab test that is abnormal or we need to change your treatment, we will call you to review the results. ? ? ?Follow-Up: ?At [facility], you and your health needs are our priority.  As part of our continuing mission to provide you with exceptional heart care, we have created designated Provider Care Teams.  These Care Teams include your primary Cardiologist (physician) and Advanced Practice Providers (APPs -  Physician Assistants and Nurse Practitioners) who all work together to provide you with the care you need, when you need it. ? ?We recommend signing up for the patient portal called "MyChart".  Sign up information is provided on this After Visit Summary.  MyChart is used to connect with patients for Virtual Visits (Telemedicine).  Patients are able to view lab/test results, encounter notes, upcoming appointments, etc.  Non-urgent messages can be sent to your provider as well.   ?To learn more about what you can do with MyChart, go to [email or web address].   ? ?Your next appointment:   ?6 month(s) ? ?The format for your next appointment:   ?In Person ? ?Provider:   ?DR. PEMBERTON  ? ?Important Information About Sugar ? ? ? ? ? ? ?

## 2021-10-03 NOTE — Progress Notes (Signed)
?Cardiology Office Note:   ? ?Date:  10/03/2021  ? ?ID:  Natasha Dickson, DOB 20-Mar-1946, MRN [id number] ? ?PCP:  Tonia Ghent, MD ?  ?[facility] Providers ?Cardiologist:  Ena Dawley, MD { ? ?Referring MD: Tonia Ghent, MD  ? ?No chief complaint on file. ? ? ?History of Present Illness:   ? ?Natasha Dickson is a 76 y.o. female with a hx of combined systolic and diastolic heart failure diagnosed in 2014 thought to be tachycardia induced CM in the setting of Afib with RVR, Afib, HTN, HLD, and CKD who was previously followed by Dr. Meda Coffee who now presents to clinic for follow-up. ? ?Per review of the record, the patient has known history of Afib with RVR with TTE showing LVEF 30-35% with diffuse hypokinesis and her stress test was negative for ischemia.  Cardiomyopathy was believed to be secondary to tachycardia associated with atrial fibrillation. LVEF improved to normal 50-55% in 2015 but then decreased to 45-50% in 2016. ? ?Was last seen in clinic on 11/2020 where she was doing well from a CV standpoint. Was tolerating Xarelto without issues. ? ?Today, she is doing well with no major complaints. For activity, she does housework and walks. When walking around the grocery store or the parking lot, she becomes short of breath. She tries to exercise as much as possible. Overall, the breathing is stable.  ? ?She endorses occasional LE edema, especially after eating salty foods. She takes Lasix once a week or when she notices swelling. She denies chest pain, chest pressure, syncope, PND, or orthopnea.  ? ?Recently, she reports a cough which she associates to allergies.  ? ?She is tolerating Xarelto with no bleeding complications. She reports she has taken and tolerated Losartan in the past. She takes all of her medications in the evening after dinner.  ? ?Past Medical History:  ?Diagnosis Date  ? Breast cancer ([place]) 1999  ? Stage I in left breast  ? Chronic combined systolic and diastolic CHF (congestive heart  failure) ([place])   ? a. 02/2013 Echo: EF 30-35%, diff HK, biatrial enlargement. b. 2D echo 07/2014 showed mildly dilated LV, EF 45-50%, severely dilated left atrium and moderately dilated right atrium.  ? CKD (chronic kidney disease), stage II   ? Closed fracture of unspecified part of neck of femur 2011  ? Right.  Soreness when standing. (Dr. Durward Fortes)  ? Nonischemic cardiomyopathy ([place])   ? a. 02/2013 Lexi CL: No ischemia, prob attenuation vs scar, EF 40%-->Med Rx.  ? Persistent atrial fibrillation ([place])   ? a. 02/2013: s/p TEE and attempted cardioversion x 2-->failed-->rate controlled with bb/digoxin, Xarelto initiated.  ? ? ?Past Surgical History:  ?Procedure Laterality Date  ? CARDIOVERSION N/A 02/18/2013  ? Procedure: CARDIOVERSION;  Surgeon: Jolaine Artist, MD;  Location: [place];  Service: Cardiovascular;  Laterality: N/A;  ? CARDIOVERSION N/A 02/22/2013  ? Procedure: CARDIOVERSION;  Surgeon: Fay Records, MD;  Location: [place];  Service: Cardiovascular;  Laterality: N/A;  ? MASTECTOMY  1999  ? Left breast with saline implant.  ? PARTIAL HIP ARTHROPLASTY Right   ? TEE WITHOUT CARDIOVERSION N/A 02/18/2013  ? Procedure: TRANSESOPHAGEAL ECHOCARDIOGRAM (TEE);  Surgeon: Jolaine Artist, MD;  Location: [facility] ENDOSCOPY;  Service: Cardiovascular;  Laterality: N/A;  ? tubal ligation    ? TUBAL LIGATION    ? ? ?Current Medications: ?Current Meds  ?Medication Sig  ? atorvastatin (LIPITOR) 10 MG tablet Take 1 tablet (10 mg total) by mouth  daily. Please keep upcoming appt in April 2023 with Dr. Johney Frame before anymore refills. Thank you  ? cyclobenzaprine (FLEXERIL) 10 MG tablet Take 0.5-1 tablets (5-10 mg total) by mouth 3 (three) times daily as needed for muscle spasms (sedation caution).  ? diltiazem (CARDIZEM CD) 180 MG 24 hr capsule Take 1 capsule (180 mg total) by mouth daily.  ? furosemide (LASIX) 20 MG tablet Take 1 tablet (20 mg total) by mouth daily.  ? losartan (COZAAR) 25 MG tablet Take 0.5 tablets  (12.5 mg total) by mouth daily.  ? metoprolol succinate (TOPROL-XL) 100 MG 24 hr tablet TAKE 1 TABLET BY MOUTH ONCE DAILY WITH MEALS OR  IMMEDIATELY  AFTER  A  MEAL  ? nitroGLYCERIN (NITROSTAT) 0.4 MG SL tablet DISSOLVE ONE TABLET UNDER THE TONGUE EVERY 5 MINUTES AS NEEDED FOR CHEST PAIN.  DO NOT EXCEED A TOTAL OF 3 DOSES IN 15 MINUTES  ? rivaroxaban (XARELTO) 20 MG TABS tablet Take 1 tablet (20 mg total) by mouth daily.  ? spironolactone (ALDACTONE) 25 MG tablet Take 1 tablet (25 mg total) by mouth daily.  ? traMADol (ULTRAM) 50 MG tablet Take 1 tablet (50 mg total) by mouth every 8 (eight) hours as needed.  ?  ? ?Allergies:   Patient has no known allergies.  ? ?Social History  ? ?Socioeconomic History  ? Marital status: Married  ?  Spouse name: Not on file  ? Number of children: Not on file  ? Years of education: Not on file  ? Highest education level: Not on file  ?Occupational History  ? Occupation: Scientist, water quality  ?  Employer: [employer]  ?Tobacco Use  ? Smoking status: Never  ? Smokeless tobacco: Never  ?Vaping Use  ? Vaping Use: Never used  ?Substance and Sexual Activity  ? Alcohol use: Yes  ?  Alcohol/week: 0.0 standard drinks  ?  Comment: RARE  ? Drug use: No  ? Sexual activity: Never  ?Other Topics Concern  ? Not on file  ?Social History Narrative  ? High School grad.  ? Married 40+ years  ? Cashier at [employer]  ? ?Social Determinants of Health  ? ?Financial Resource Strain: Not on file  ?Food Insecurity: Not on file  ?Transportation Needs: Not on file  ?Physical Activity: Not on file  ?Stress: Not on file  ?Social Connections: Not on file  ?  ? ?Family History: ?The patient's family history includes COPD in her mother; Colon cancer in her father; Congenital heart disease in her son; Heart attack in her father; Heart disease in her brother and father; Hypertension in her father. There is no history of Stroke. ? ?ROS:   ?Review of Systems  ?Constitutional:  Negative for diaphoresis and malaise/fatigue.   ?HENT:  Negative for ear pain and nosebleeds.   ?Eyes:  Negative for pain and redness.  ?Respiratory:  Positive for cough and shortness of breath (exertional). Negative for wheezing.   ?Cardiovascular:  Positive for leg swelling (bilateral). Negative for chest pain, palpitations, orthopnea, claudication and PND.  ?Gastrointestinal:  Negative for abdominal pain and diarrhea.  ?Genitourinary:  Negative for flank pain and frequency.  ?Musculoskeletal:  Negative for joint pain and myalgias.  ?Neurological:  Negative for dizziness and headaches.  ?Endo/Heme/Allergies:  Positive for environmental allergies. Does not bruise/bleed easily.  ?Psychiatric/Behavioral:  Negative for depression. The patient is not nervous/anxious.   ? ? ?EKGs/Labs/Other Studies Reviewed:   ? ?The following studies were reviewed today: ?TTE 08/05/2014 ?- Left ventricle: The cavity  size was mildly dilated. Wall ?  thickness was normal. Systolic function was mildly reduced. The ?  estimated ejection fraction was in the range of 45% to 50%. ?- Left atrium: The atrium was severely dilated. ?- Right atrium: The atrium was moderately dilated. ?- Atrial septum: No defect or patent foramen ovale was identified. ? ?EKG:   ?10/03/21: Atrial fibrillation, rate 109 bpm ?12/04/20: Atrial fibrillation, rate 112 ? ?Recent Labs: ?12/20/2020: BUN 16; Creatinine, Ser 1.15; Potassium 4.1; Sodium 141  ?Recent Lipid Panel ?   ?Component Value Date/Time  ? CHOL 149 07/04/2020 1041  ? TRIG 95 07/04/2020 1041  ? HDL 47 07/04/2020 1041  ? CHOLHDL 3.2 07/04/2020 1041  ? CHOLHDL 4.2 03/11/2016 0911  ? VLDL 26 03/11/2016 0911  ? [place] 84 07/04/2020 1041  ? LDLDIRECT 137.1 03/29/2011 1016  ? ? ? ?Risk Assessment/Calculations:   ? ?CHA2DS2-VASc Score = 5  ?This indicates a 7.2% annual risk of stroke. ?The patient's score is based upon: ?CHF History: 1 ?HTN History: 1 ?Diabetes History: 0 ?Stroke History: 0 ?Vascular Disease History: 1 ?Age Score: 1 ?Gender Score: 1 ?   ? ? ? ?Physical Exam:   ? ?VS:  BP 118/70   Pulse (!) 109   Ht '5\' 1"'$  (1.549 m)   Wt 218 lb 12.8 oz (99.2 kg)   SpO2 99%   BMI 41.34 kg/m?    ? ?Wt Readings from Last 3 Encounters:  ?10/03/21 218 lb 12.8 oz (99.2 kg

## 2021-10-10 ENCOUNTER — Other Ambulatory Visit: Payer: Medicare Other

## 2021-10-11 ENCOUNTER — Other Ambulatory Visit: Payer: Medicare Other

## 2021-10-11 DIAGNOSIS — E782 Mixed hyperlipidemia: Secondary | ICD-10-CM | POA: Diagnosis not present

## 2021-10-11 DIAGNOSIS — I10 Essential (primary) hypertension: Secondary | ICD-10-CM | POA: Diagnosis not present

## 2021-10-11 DIAGNOSIS — Z79899 Other long term (current) drug therapy: Secondary | ICD-10-CM | POA: Diagnosis not present

## 2021-10-12 LAB — BASIC METABOLIC PANEL
BUN/Creatinine Ratio: 13 (ref 12–28)
BUN: 14 mg/dL (ref 8–27)
CO2: 19 mmol/L — ABNORMAL LOW (ref 20–29)
Calcium: 9.5 mg/dL (ref 8.7–10.3)
Chloride: 104 mmol/L (ref 96–106)
Creatinine, Ser: 1.11 mg/dL — ABNORMAL HIGH (ref 0.57–1.00)
Glucose: 75 mg/dL (ref 70–99)
Potassium: 4.2 mmol/L (ref 3.5–5.2)
Sodium: 141 mmol/L (ref 134–144)
eGFR: 52 mL/min/{1.73_m2} — ABNORMAL LOW (ref >59)

## 2021-10-12 LAB — LIPID PANEL
Chol/HDL Ratio: 2.7 ratio (ref 0.0–4.4)
Cholesterol, Total: 128 mg/dL (ref 100–199)
HDL: 48 mg/dL (ref >39)
LDL Chol Calc (NIH): 66 mg/dL (ref 0–99)
Triglycerides: 71 mg/dL (ref 0–149)
VLDL Cholesterol Cal: 14 mg/dL (ref 5–40)

## 2021-10-15 ENCOUNTER — Telehealth: Payer: Self-pay | Admitting: Clinical

## 2021-10-15 ENCOUNTER — Telehealth: Payer: Self-pay | Admitting: Cardiology

## 2021-10-15 NOTE — Telephone Encounter (Signed)
Natasha Dickson would like for nurse to call regarding lab results. Natasha Dickson states that she would also like for Dr. Johney Frame to write her a prescription for a muscle relaxer due to her hurting her back. Please advise ?

## 2021-10-15 NOTE — Telephone Encounter (Signed)
Natasha Bergeron, MD  ?10/13/2021  7:51 PM EDT   ?  ?Her kidney function is stable. Electrolytes and cholesterol look great.   ? ?The patient has been notified of the result and verbalized understanding.  All questions (if any) were answered. ?Natasha Iba, RN 10/15/2021 12:00 PM  ? ?Patient has been doing yard work and her back is hurting. She would like a prescription for flexeril. I have advised her to call her PCP. Patient verbalized understanding ?

## 2021-10-24 ENCOUNTER — Ambulatory Visit (INDEPENDENT_AMBULATORY_CARE_PROVIDER_SITE_OTHER): Payer: Medicare Other

## 2021-10-24 VITALS — Ht 61.0 in | Wt 218.0 lb

## 2021-10-24 DIAGNOSIS — Z0000 Encounter for general adult medical examination without abnormal findings: Secondary | ICD-10-CM

## 2021-10-24 NOTE — Patient Instructions (Addendum)
?[name] , ?Thank you for taking time to come for your Medicare Wellness Visit. I appreciate your ongoing commitment to your health goals. Please review the following plan we discussed and let me know if I can assist you in the future.  ? ?These are the goals we discussed: ? Goals   ? ?   Patint stated (pt-stated)   ?   No current goals.  ?  ? ?  ?  ?This is a list of the screening recommended for you and due dates:  ?Health Maintenance  ?Topic Date Due  ? Zoster (Shingles) Vaccine (1 of 2) 01/24/2022*  ? Pneumonia Vaccine (2 - PPSV23 if available, else PCV20) 10/25/2022*  ? Tetanus Vaccine  10/25/2022*  ? Colon Cancer Screening  03/05/2026*  ? Hepatitis C Screening: USPSTF Recommendation to screen - Ages 18-79 yo.  03/05/2026*  ? Flu Shot  01/08/2022  ? DEXA scan (bone density measurement)  Completed  ? HPV Vaccine  Aged Out  ? COVID-19 Vaccine  Discontinued  ?*Topic was postponed. The date shown is not the original due date.  ?  ?Opioid Pain Medicine Management ?Opioids are powerful medicines that are used to treat moderate to severe pain. When used for short periods of time, they can help you to: ?Sleep better. ?Do better in physical or occupational therapy. ?Feel better in the first few days after an injury. ?Recover from surgery. ?Opioids should be taken with the supervision of a trained health care provider. They should be taken for the shortest period of time possible. This is because opioids can be addictive, and the longer you take opioids, the greater your risk of addiction. This addiction can also be called opioid use disorder. ?What are the risks? ?Using opioid pain medicines for longer than 3 days increases your risk of side effects. Side effects include: ?Constipation. ?Nausea and vomiting. ?Breathing difficulties (respiratory depression). ?Drowsiness. ?Confusion. ?Opioid use disorder. ?Itching. ?Taking opioid pain medicine for a long period of time can affect your ability to do daily tasks. It also  puts you at risk for: ?Motor vehicle crashes. ?Depression. ?Suicide. ?Heart attack. ?Overdose, which can be life-threatening. ?What is a pain treatment plan? ?A pain treatment plan is an agreement between you and your health care provider. Pain is unique to each person, and treatments vary depending on your condition. To manage your pain, you and your health care provider need to work together. To help you do this: ?Discuss the goals of your treatment, including how much pain you might expect to have and how you will manage the pain. ?Review the risks and benefits of taking opioid medicines. ?Remember that a good treatment plan uses more than one approach and minimizes the chance of side effects. ?Be honest about the amount of medicines you take and about any drug or alcohol use. ?Get pain medicine prescriptions from only one health care provider. ?Pain can be managed with many types of alternative treatments. Ask your health care provider to refer you to one or more specialists who can help you manage pain through: ?Physical or occupational therapy. ?Counseling (cognitive behavioral therapy). ?Good nutrition. ?Biofeedback. ?Massage. ?Meditation. ?Non-opioid medicine. ?Following a gentle exercise program. ?How to use opioid pain medicine ?Taking medicine ?Take your pain medicine exactly as told by your health care provider. Take it only when you need it. ?If your pain gets less severe, you may take less than your prescribed dose if your health care provider approves. ?If you are not having pain, do nottake  pain medicine unless your health care provider tells you to take it. ?If your pain is severe, do nottry to treat it yourself by taking more pills than instructed on your prescription. Contact your health care provider for help. ?Write down the times when you take your pain medicine. It is easy to become confused while on pain medicine. Writing the time can help you avoid overdose. ?Take other over-the-counter or  prescription medicines only as told by your health care provider. ?Keeping yourself and others safe ? ?While you are taking opioid pain medicine: ?Do not drive, use machinery, or power tools. ?Do not sign legal documents. ?Do not drink alcohol. ?Do not take sleeping pills. ?Do not supervise children by yourself. ?Do not do activities that require climbing or being in high places. ?Do not go to a lake, river, ocean, spa, or swimming pool. ?Do not share your pain medicine with anyone. ?Keep pain medicine in a locked cabinet or in a secure area where pets and children cannot reach it. ?Stopping your use of opioids ?If you have been taking opioid medicine for more than a few weeks, you may need to slowly decrease (taper) how much you take until you stop completely. Tapering your use of opioids can decrease your risk of symptoms of withdrawal, such as: ?Pain and cramping in the abdomen. ?Nausea. ?Sweating. ?Sleepiness. ?Restlessness. ?Uncontrollable shaking (tremors). ?Cravings for the medicine. ?Do not attempt to taper your use of opioids on your own. Talk with your health care provider about how to do this. Your health care provider may prescribe a step-down schedule based on how much medicine you are taking and how long you have been taking it. ?Getting rid of leftover pills ?Do not save any leftover pills. Get rid of leftover pills safely by: ?Taking the medicine to a prescription take-back program. This is usually offered by the county or law enforcement. ?Bringing them to a pharmacy that has a drug disposal container. ?Flushing them down the toilet. Check the label or package insert of your medicine to see whether this is safe to do. ?Throwing them out in the trash. Check the label or package insert of your medicine to see whether this is safe to do. ?If it is safe to throw it out, remove the medicine from the original container, put it into a sealable bag or container, and mix it with used coffee grounds, food  scraps, dirt, or cat litter before putting it in the trash. ?Follow these instructions at home: ?Activity ?Do exercises as told by your health care provider. ?Avoid activities that make your pain worse. ?Return to your normal activities as told by your health care provider. Ask your health care provider what activities are safe for you. ?General instructions ?You may need to take these actions to prevent or treat constipation: ?Drink enough fluid to keep your urine pale yellow. ?Take over-the-counter or prescription medicines. ?Eat foods that are high in fiber, such as beans, whole grains, and fresh fruits and vegetables. ?Limit foods that are high in fat and processed sugars, such as fried or sweet foods. ?Keep all follow-up visits. This is important. ?Where to find support ?If you have been taking opioids for a long time, you may benefit from receiving support for quitting from a local support group or counselor. Ask your health care provider for a referral to these resources in your area. ?Where to find more information ?Centers for Disease Control and Prevention (CDC): http://www.wolf.info/ ?U.[place] Food and Drug Administration (FDA): GuamGaming.ch ?Get help  right away if: ?You may have taken too much of an opioid (overdosed). Common symptoms of an overdose: ?Your breathing is slower or more shallow than normal. ?You have a very slow heartbeat (pulse). ?You have slurred speech. ?You have nausea and vomiting. ?Your pupils become very small. ?You have other potential symptoms: ?You are very confused. ?You faint or feel like you will faint. ?You have cold, clammy skin. ?You have blue lips or fingernails. ?You have thoughts of harming yourself or harming others. ?These symptoms may represent a serious problem that is an emergency. Do not wait to see if the symptoms will go away. Get medical help right away. Call your local emergency services (911 in the [place] Do not drive yourself to the hospital.  ?If you ever feel like you may  hurt yourself or others, or have thoughts about taking your own life, get help right away. Go to your nearest emergency department or: ?Call your local emergency services (911 in the [place] ?Call the [facility]

## 2021-10-24 NOTE — Progress Notes (Signed)
? ?Subjective:  ? Natasha Dickson is a 76 y.o. female who presents for Medicare Annual (Subsequent) preventive examination. ? ?Review of Systems    ?Virtual Visit via Telephone Note ? ?I connected with  Natasha Dickson on 10/24/21 at  3:45 PM EDT by telephone and verified that I am speaking with the correct person using two identifiers. ? ?Location: ?Patient: Home ?Provider: Office ?Persons participating in the virtual visit: patient/Nurse Health Advisor ?  ?I discussed the limitations, risks, security and privacy concerns of performing an evaluation and management service by telephone and the availability of in person appointments. The patient expressed understanding and agreed to proceed. ? ?Interactive audio and video telecommunications were attempted between this nurse and patient, however failed, due to patient having technical difficulties OR patient did not have access to video capability.  We continued and completed visit with audio only. ? ?Some vital signs may be absent or patient reported.  ? ?Natasha Peaches, LPN  ?Cardiac Risk Factors include: advanced age (>70mn, >>1women) ? ?   ?Objective:  ?  ?Today's Vitals  ? 10/24/21 1551  ?Weight: 218 lb (98.9 kg)  ?Height: '5\' 1"'$  (1.549 m)  ? ?Body mass index is 41.19 kg/m?. ? ? ?  10/24/2021  ?  4:01 PM 03/05/2016  ?  3:51 PM 02/16/2013  ? 10:08 AM  ?Advanced Directives  ?Does Patient Have a Medical Advance Directive? No No Patient does not have advance directive  ?Would patient like information on creating a medical advance directive? No - Patient declined No - patient declined information   ? ? ?Current Medications (verified) ?Outpatient Encounter Medications as of 10/24/2021  ?Medication Sig  ? atorvastatin (LIPITOR) 10 MG tablet Take 1 tablet (10 mg total) by mouth daily. Please keep upcoming appt in April 2023 with Dr. PJohney Framebefore anymore refills. Thank you  ? cyclobenzaprine (FLEXERIL) 10 MG tablet Take 0.5-1 tablets (5-10 mg total) by mouth 3 (three)  times daily as needed for muscle spasms (sedation caution).  ? diltiazem (CARDIZEM CD) 180 MG 24 hr capsule Take 1 capsule (180 mg total) by mouth daily.  ? furosemide (LASIX) 20 MG tablet Take 1 tablet (20 mg total) by mouth daily.  ? losartan (COZAAR) 25 MG tablet Take 0.5 tablets (12.5 mg total) by mouth daily.  ? metoprolol succinate (TOPROL-XL) 100 MG 24 hr tablet TAKE 1 TABLET BY MOUTH ONCE DAILY WITH MEALS OR  IMMEDIATELY  AFTER  A  MEAL  ? nitroGLYCERIN (NITROSTAT) 0.4 MG SL tablet DISSOLVE ONE TABLET UNDER THE TONGUE EVERY 5 MINUTES AS NEEDED FOR CHEST PAIN.  DO NOT EXCEED A TOTAL OF 3 DOSES IN 15 MINUTES  ? rivaroxaban (XARELTO) 20 MG TABS tablet Take 1 tablet (20 mg total) by mouth daily.  ? spironolactone (ALDACTONE) 25 MG tablet Take 1 tablet (25 mg total) by mouth daily.  ? traMADol (ULTRAM) 50 MG tablet Take 1 tablet (50 mg total) by mouth every 8 (eight) hours as needed.  ? ?No facility-administered encounter medications on file as of 10/24/2021.  ? ? ?Allergies (verified) ?Patient has no known allergies.  ? ?History: ?Past Medical History:  ?Diagnosis Date  ? Breast [place]  ? Stage I in left breast  ? Chronic combined systolic and diastolic CHF (congestive heart [place]) [place]   ? a. 02/2013 Echo: EF 30-35%, diff HK, biatrial enlargement. b. 2D echo 07/2014 showed mildly dilated LV, EF 45-50%, severely dilated left atrium and moderately dilated right atrium.  ?  CKD (chronic kidney disease), stage II   ? Closed fracture of unspecified part of neck of femur 2011  ? Right.  Soreness when standing. (Dr. Durward Fortes)  ? Nonischemic cardiomyopathy ([place])   ? a. 02/2013 Lexi CL: No ischemia, prob attenuation vs scar, EF 40%-->Med Rx.  ? Persistent atrial fibrillation ([place])   ? a. 02/2013: s/p TEE and attempted cardioversion x 2-->failed-->rate controlled with bb/digoxin, Xarelto initiated.  ? ?Past Surgical History:  ?Procedure Laterality Date  ? CARDIOVERSION N/A 02/18/2013  ? Procedure: CARDIOVERSION;   Surgeon: Jolaine Artist, MD;  Location: [place];  Service: Cardiovascular;  Laterality: N/A;  ? CARDIOVERSION N/A 02/22/2013  ? Procedure: CARDIOVERSION;  Surgeon: Fay Records, MD;  Location: [facility];  Service: Cardiovascular;  Laterality: N/A;  ? MASTECTOMY  1999  ? Left breast with saline implant.  ? PARTIAL HIP ARTHROPLASTY Right   ? TEE WITHOUT CARDIOVERSION N/A 02/18/2013  ? Procedure: TRANSESOPHAGEAL ECHOCARDIOGRAM (TEE);  Surgeon: Jolaine Artist, MD;  [facility]: [facility] ENDOSCOPY;  Service: Cardiovascular;  Laterality: N/A;  ? tubal ligation    ? TUBAL LIGATION    ? ?Family History  ?Problem Relation Age of Onset  ? Congenital heart disease Son   ?      transposition of great vessels- corrected with  MI  as an adult  ? COPD Mother   ?     from long-term smoking  ? Colon cancer Father   ? Heart disease Father   ? Heart attack Father   ? Hypertension Father   ? Heart disease Brother   ? Stroke Neg Hx   ? ?Social History  ? ?Socioeconomic History  ? Marital status: Married  ?  Spouse name: Not on file  ? Number of children: Not on file  ? Years of education: Not on file  ? Highest education level: Not on file  ?Occupational History  ? Occupation: Scientist, water quality  ?  Employer: [employer]  ?Tobacco Use  ? Smoking status: Never  ? Smokeless tobacco: Never  ?Vaping Use  ? Vaping Use: Never used  ?Substance and Sexual Activity  ? Alcohol use: Yes  ?  Alcohol/week: 0.0 standard drinks  ?  Comment: RARE  ? Drug use: No  ? Sexual activity: Never  ?Other Topics Concern  ? Not on file  ?Social History Narrative  ? High School grad.  ? Married 40+ years  ? Cashier at [employer]  ? ?Social Determinants of Health  ? ?Financial Resource Strain: Low Risk   ? Difficulty of Paying Living Expenses: Not hard at all  ?Food Insecurity: No Food Insecurity  ? Worried About Charity fundraiser in the Last Year: Never true  ? Ran Out of Food in the Last Year: Never true  ?Transportation Needs: No Transportation Needs  ? Lack of  Transportation (Medical): No  ? Lack of Transportation (Non-Medical): No  ?Physical Activity: Inactive  ? Days of Exercise per Week: 0 days  ? Minutes of Exercise per Session: 0 min  ?Stress: No Stress Concern Present  ? Feeling of Stress : Not at all  ?Social Connections: Moderately Isolated  ? Frequency of Communication with Friends and Family: More than three times a week  ? Frequency of Social Gatherings with Friends and Family: More than three times a week  ? Attends Religious Services: Never  ? Active Member of Clubs or Organizations: No  ? Attends Archivist Meetings: Never  ? Marital Status: Married  ? ? ? ?  Clinical Intake: ? ?Diabetic?  No ? ? ?Activities of Daily Living ? ?  10/24/2021  ?  3:59 PM 01/01/2021  ?  8:34 AM  ?In your present state of health, do you have any difficulty performing the following activities:  ?Hearing? 0 0  ?Vision? 0 0  ?Difficulty concentrating or making decisions? 0 0  ?Walking or climbing stairs? 0 1  ?Dressing or bathing? 0 0  ?Doing errands, shopping? 0 0  ?Preparing Food and eating ? N   ?Using the Toilet? N   ?In the past six months, have you accidently leaked urine? N   ?Do you have problems with loss of bowel control? N   ?Managing your Medications? N   ?Managing your Finances? N   ?Housekeeping or managing your Housekeeping? N   ? ? ?Patient Care Team: ?Tonia Ghent, MD as PCP - General (Family Medicine) ?Dorothy Spark, MD as PCP - Cardiology (Cardiology) ? ?Indicate any recent Medical Services you may have received from other than [facility] providers in the past year (date may be approximate). ? ?   ?Assessment:  ? This is a routine wellness examination for Natasha Dickson. ? ?Hearing/Vision screen ?Hearing Screening - Comments:: No hearing difficulty ?Vision Screening - Comments:: Wears glasses. [facility] ? ?Dietary issues and exercise activities discussed: ?Exercise limited by: None identified ? ? Goals Addressed   ? ?  ?  ?  ?  ?  ? This  Visit's Progress  ?   Patint stated (pt-stated)     ?   No current goals.  ?  ? ?  ? ?Depression Screen ? ?  10/24/2021  ?  3:57 PM 01/01/2021  ?  8:33 AM 03/05/2016  ?  3:51 PM  ?PHQ 2/9 Scores  ?PHQ - 2 Score

## 2021-11-13 ENCOUNTER — Other Ambulatory Visit: Payer: Self-pay

## 2021-11-13 DIAGNOSIS — I4891 Unspecified atrial fibrillation: Secondary | ICD-10-CM

## 2021-11-13 MED ORDER — RIVAROXABAN 20 MG PO TABS: 20.0000 mg | ORAL_TABLET | Freq: Every day | ORAL | 5 refills | Status: DC

## 2021-11-13 NOTE — Telephone Encounter (Signed)
Xarelto '20mg'$  refill request received. Pt is 76 years old, weight-98.9kg, Crea-1.11 on 10/11/2021, last seen by Dr. Johney Frame on 10/03/2021, Diagnosis-Afib, [place].68m/min; Dose is appropriate based on dosing criteria. Will send in refill to requested pharmacy.

## 2022-01-14 ENCOUNTER — Other Ambulatory Visit: Payer: Self-pay | Admitting: *Deleted

## 2022-01-14 MED ORDER — ATORVASTATIN CALCIUM 10 MG PO TABS: 10.0000 mg | ORAL_TABLET | Freq: Every day | ORAL | 3 refills | Status: DC

## 2022-01-17 ENCOUNTER — Telehealth: Payer: Self-pay | Admitting: Cardiology

## 2022-01-17 NOTE — Telephone Encounter (Signed)
Pt c/o medication issue:  1. Name of Medication: rivaroxaban (XARELTO) 20 MG TABS tablet  2. How are you currently taking this medication (dosage and times per day)? As prescribed   3. Are you having a reaction (difficulty breathing--STAT)? No  4. What is your medication issue? Pt would like a call back to discuss options with this medication due to the price increase.

## 2022-01-18 NOTE — Telephone Encounter (Signed)
**Note De-Identified  Obfuscation** No answer so I left a VM asking the pt to call Jeani Hawking at Dr Jacolyn Reedy office at [facility] at [phone].

## 2022-01-23 NOTE — Telephone Encounter (Signed)
Pt is calling Jeani Hawking Via Prior Auth Nurse back from 8/11. Pt states her Xarelto is costing around $97  a month and last year she paid around $37 a month.  She states Jeani Hawking was going to assist her with getting the cost down or help her apply for pt assistance for this medication. She states she has plenty of Xarelto on hand and will continue taking this.  She states she would like to see if Jeani Hawking could possibly get the cost back down for her.  Pt aware that Jeani Hawking is out of the office this week and will be back next Monday 8/21. She is aware that I will route this message to her to further follow-up and assist the pt next week, when she returns to the office.  Advised the pt that she needs to be on the listen out for her call, so that this matter can get handled in a timely manner.  Pt verbalized understanding and agrees with this plan.  Pt was more than gracious for all the assistance provided and will await Lynn's call.

## 2022-01-23 NOTE — Telephone Encounter (Signed)
Patient is returning Lynn's call. Please advise.

## 2022-01-28 NOTE — Telephone Encounter (Signed)
**Note De-Identified  Obfuscation** The pt and I discussed the [employer] program where she can get her Xarelto for $85/1 month or $240/3 month supply.  She is interested in enrolling so I gave her their phone number and she states that she is calling them this morning to enroll in their Xarelto discount program.  She is aware to call us back if she has any questions or concerns after speaking with Engineer, maintenance. She thanked me for calling her back.

## 2022-02-26 ENCOUNTER — Other Ambulatory Visit: Payer: Self-pay | Admitting: *Deleted

## 2022-02-26 DIAGNOSIS — Z7901 Long term (current) use of anticoagulants: Secondary | ICD-10-CM

## 2022-02-26 DIAGNOSIS — I4891 Unspecified atrial fibrillation: Secondary | ICD-10-CM

## 2022-02-26 DIAGNOSIS — Z79899 Other long term (current) drug therapy: Secondary | ICD-10-CM

## 2022-02-26 DIAGNOSIS — N182 Chronic kidney disease, stage 2 (mild): Secondary | ICD-10-CM

## 2022-02-26 DIAGNOSIS — I5041 Acute combined systolic (congestive) and diastolic (congestive) heart failure: Secondary | ICD-10-CM

## 2022-02-26 DIAGNOSIS — I428 Other cardiomyopathies: Secondary | ICD-10-CM

## 2022-02-26 DIAGNOSIS — I5042 Chronic combined systolic (congestive) and diastolic (congestive) heart failure: Secondary | ICD-10-CM

## 2022-02-26 DIAGNOSIS — E782 Mixed hyperlipidemia: Secondary | ICD-10-CM

## 2022-02-26 DIAGNOSIS — R0602 Shortness of breath: Secondary | ICD-10-CM

## 2022-02-26 DIAGNOSIS — I482 Chronic atrial fibrillation, unspecified: Secondary | ICD-10-CM

## 2022-02-26 DIAGNOSIS — I10 Essential (primary) hypertension: Secondary | ICD-10-CM

## 2022-02-26 MED ORDER — DILTIAZEM HCL ER COATED BEADS 180 MG PO CP24: 180.0000 mg | ORAL_CAPSULE | Freq: Every day | ORAL | 3 refills | Status: DC

## 2022-02-26 MED ORDER — METOPROLOL SUCCINATE ER 100 MG PO TB24: ORAL_TABLET | ORAL | 3 refills | Status: DC

## 2022-02-26 MED ORDER — FUROSEMIDE 20 MG PO TABS: 20.0000 mg | ORAL_TABLET | Freq: Every day | ORAL | 3 refills | Status: DC

## 2022-02-26 MED ORDER — SPIRONOLACTONE 25 MG PO TABS: 25.0000 mg | ORAL_TABLET | Freq: Every day | ORAL | 3 refills | Status: DC

## 2022-03-31 NOTE — Progress Notes (Unsigned)
Cardiology Office Note:    Date:  03/31/2022   ID:  Natasha Dickson, DOB 11/11/1945, MRN [id number]  PCP:  Tonia Ghent, MD   [facility] Providers Cardiologist:  Ena Dawley, MD {  Referring MD: Tonia Ghent, MD   No chief complaint on file.   History of Present Illness:    Natasha Dickson is a 76 y.o. female with a hx of combined systolic and diastolic heart failure diagnosed in 2014 thought to be tachycardia induced CM in the setting of Afib with RVR, Afib, HTN, HLD, and CKD who was previously followed by Dr. Meda Coffee who now presents to clinic for follow-up.  Per review of the record, the patient has known history of Afib with RVR with TTE showing LVEF 30-35% with diffuse hypokinesis and her stress test was negative for ischemia.  Cardiomyopathy was believed to be secondary to tachycardia associated with atrial fibrillation. LVEF improved to normal 50-55% in 2015 but then decreased to 45-50% in 2016.  Was last seen in clinic on 11/2020 where she was doing well from a CV standpoint. Was tolerating Xarelto without issues.  Was last seen in clinic on 10/03/21 where she was doing well from a CV standpoint.  Today, ***  Past Medical History:  Diagnosis Date   Breast cancer ([place]) 1999   Stage I in left breast   Chronic combined systolic and diastolic CHF (congestive heart failure) ([place])    a. 02/2013 Echo: EF 30-35%, diff HK, biatrial enlargement. b. 2D echo 07/2014 showed mildly dilated LV, EF 45-50%, severely dilated left atrium and moderately dilated right atrium.   CKD (chronic kidney disease), stage II    Closed fracture of unspecified part of neck of femur 2011   Right.  Soreness when standing. (Dr. Durward Fortes)   Nonischemic cardiomyopathy [facility])    a. 02/2013 Lexi CL: No ischemia, prob attenuation vs scar, EF 40%-->Med Rx.   Persistent atrial fibrillation ([place])    a. 02/2013: s/p TEE and attempted cardioversion x 2-->failed-->rate controlled with bb/digoxin, Xarelto  initiated.    Past Surgical History:  Procedure Laterality Date   CARDIOVERSION N/A 02/18/2013   Procedure: CARDIOVERSION;  Surgeon: Jolaine Artist, MD;  Location[facility] ENDOSCOPY;  Service: Cardiovascular;  Laterality: N/A;   CARDIOVERSION N/A 02/22/2013   Procedure: CARDIOVERSION;  Surgeon: Fay Records, MD;  Location: [facility] ENDOSCOPY;  Service: Cardiovascular;  Laterality: N/A;   MASTECTOMY  1999   Left breast with saline implant.   PARTIAL HIP ARTHROPLASTY Right    TEE WITHOUT CARDIOVERSION N/A 02/18/2013   Procedure: TRANSESOPHAGEAL ECHOCARDIOGRAM (TEE);  Surgeon: Jolaine Artist, MD;  Location: [facility] ENDOSCOPY;  Service: Cardiovascular;  Laterality: N/A;   tubal ligation     TUBAL LIGATION      Current Medications: No outpatient medications have been marked as taking for the 04/02/22 encounter (Appointment) with Freada Bergeron, MD.     Allergies:   Patient has no known allergies.   Social History   Socioeconomic History   Marital status: Married    Spouse name: Not on file   Number of children: Not on file   Years of education: Not on file   Highest education level: Not on file  Occupational History   Occupation: Scientist, water quality    Employer: [employer]  Tobacco Use   Smoking status: Never   Smokeless tobacco: Never  Vaping Use   Vaping Use: Never used  Substance and Sexual Activity   Alcohol use: Yes  Alcohol/week: 0.0 standard drinks of alcohol    Comment: RARE   Drug use: No   Sexual activity: Never  Other Topics Concern   Not on file  Social History Narrative   High School grad.   Married 40+ years   Scientist, water quality at [employer] Strain: Low Risk  (10/24/2021)   Overall Financial Resource Strain (CARDIA)    Difficulty of Paying Living Expenses: Not hard at all  Food Insecurity: No Food Insecurity (10/24/2021)   Hunger Vital Sign    Worried About Running Out of Food in the Last Year: Never true    Ran Out  of Food in the Last Year: Never true  Transportation Needs: No Transportation Needs (10/24/2021)   PRAPARE - Hydrologist (Medical): No    Lack of Transportation (Non-Medical): No  Physical Activity: Inactive (10/24/2021)   Exercise Vital Sign    Days of Exercise per Week: 0 days    Minutes of Exercise per Session: 0 min  Stress: No Stress Concern Present (10/24/2021)   [place]    Feeling of Stress : Not at all  Social Connections: Moderately Isolated (10/24/2021)   Social Connection and Isolation Panel [NHANES]    Frequency of Communication with Friends and Family: More than three times a week    Frequency of Social Gatherings with Friends and Family: More than three times a week    Attends Religious Services: Never    Marine scientist or Organizations: No    Attends Music therapist: Never    Marital Status: Married     Family History: The patient's family history includes COPD in her mother; Colon cancer in her father; Congenital heart disease in her son; Heart attack in her father; Heart disease in her brother and father; Hypertension in her father. There is no history of Stroke.  ROS:   Review of Systems  Constitutional:  Negative for diaphoresis and malaise/fatigue.  HENT:  Negative for ear pain and nosebleeds.   Eyes:  Negative for pain and redness.  Respiratory:  Positive for cough and shortness of breath (exertional). Negative for wheezing.   Cardiovascular:  Positive for leg swelling (bilateral). Negative for chest pain, palpitations, orthopnea, claudication and PND.  Gastrointestinal:  Negative for abdominal pain and diarrhea.  Genitourinary:  Negative for flank pain and frequency.  Musculoskeletal:  Negative for joint pain and myalgias.  Neurological:  Negative for dizziness and headaches.  Endo/Heme/Allergies:  Positive for environmental allergies. Does not  bruise/bleed easily.  Psychiatric/Behavioral:  Negative for depression. The patient is not nervous/anxious.      EKGs/Labs/Other Studies Reviewed:    The following studies were reviewed today: TTE Aug 10, 2014 - Left ventricle: The cavity size was mildly dilated. Wall   thickness was normal. Systolic function was mildly reduced. The   estimated ejection fraction was in the range of 45% to 50%. - Left atrium: The atrium was severely dilated. - Right atrium: The atrium was moderately dilated. - Atrial septum: No defect or patent foramen ovale was identified.  EKG:   10/03/21: Atrial fibrillation, rate 109 bpm 12/04/20: Atrial fibrillation, rate 112  Recent Labs: 10/11/2021: BUN 14; Creatinine, Ser 1.11; Potassium 4.2; Sodium 141  Recent Lipid Panel    Component Value Date/Time   CHOL 128 10/11/2021 1136   TRIG 71 10/11/2021 1136   HDL 48 10/11/2021 1136   CHOLHDL  2.7 10/11/2021 1136   CHOLHDL 4.2 03/11/2016 0911   VLDL 26 03/11/2016 0911   LDLCALC 66 10/11/2021 1136   LDLDIRECT 137.1 03/29/2011 1016     Risk Assessment/Calculations:    CHA2DS2-VASc Score =    This indicates a  % annual risk of stroke. The patient's score is based upon:        Physical Exam:    VS:  There were no vitals taken for this visit.    Wt Readings from Last 3 Encounters:  10/24/21 218 lb (98.9 kg)  10/03/21 218 lb 12.8 oz (99.2 kg)  01/01/21 222 lb (100.7 kg)     GEN: Comfortable, NAD HEENT: Normal NECK: No JVD; No carotid bruits CARDIAC: Irregularly Irregular, no murmurs, rubs, gallops RESPIRATORY:  Clear to auscultation without rales, wheezing or rhonchi  ABDOMEN: Soft, non-tender, non-distended MUSCULOSKELETAL:  Trace bilateral LE edema to the mid-shins. Warm and dry SKIN: Warm and dry NEUROLOGIC:  Alert and oriented x 3 PSYCHIATRIC:  Normal affect   ASSESSMENT:    No diagnosis found.   PLAN:    In order of problems listed above:  #Chronic Combined Systolic and Diastolic  HF: Thought to be secondary to tachy induced CM in the setting of Afib with RVR. LVEF initially 30-35%->45-50%. Currently with NYHA class II symptoms. Mild LE edema on exam which improves with compression socks and lasix.  -Continue metop '100mg'$  XL daily -Continue lasix '20mg'$  prn for LE edema -Continue spironolactone '25mg'$  daily -Continue losartan 12.'5mg'$  daily -Low Na diet -Repeat TTE  to reassess LVEF***  #Persistent Afib: CHADs-vasc 5. Pursuing rate control. Overall well controlled at home.  -Continue metop '100mg'$  XL daily -Continue xarelto '20mg'$  daily -Continue dilt '180mg'$  daily  #HTN: Well controlled at home running 120/80s. -Continue metop '100mg'$  XL daily -Continue dilt '180mg'$  daily -Continue spironolactone '25mg'$  daily -Continue losartan 12.'5mg'$  daily  #CKD Stage II: Cr stable on most recent labs.  #HLD: -Continue lipitor '10mg'$  daily  Follow-up in 6 months.  Medication Adjustments/Labs and Tests Ordered: Current medicines are reviewed at length with the patient today.  Concerns regarding medicines are outlined above.  No orders of the defined types were placed in this encounter.  No orders of the defined types were placed in this encounter.   There are no Patient Instructions on file for this visit.    I,Mykaella Javier,acting as a scribe for Freada Bergeron, MD.,have documented all relevant documentation on the behalf of Freada Bergeron, MD,as directed by  Freada Bergeron, MD while in the presence of Freada Bergeron, MD.  I, Freada Bergeron, MD, have reviewed all documentation for this visit. The documentation on 03/31/22 for the exam, diagnosis, procedures, and orders are all accurate and complete.   Signed, Freada Bergeron, MD  03/31/2022 7:59 PM    [facility]

## 2022-04-02 ENCOUNTER — Ambulatory Visit: Payer: Medicare Other | Attending: Cardiology | Admitting: Cardiology

## 2022-04-02 ENCOUNTER — Encounter: Payer: Self-pay | Admitting: Cardiology

## 2022-04-02 VITALS — BP 108/65 | HR 90 | Ht 61.0 in | Wt 213.6 lb

## 2022-04-02 DIAGNOSIS — I5042 Chronic combined systolic (congestive) and diastolic (congestive) heart failure: Secondary | ICD-10-CM | POA: Diagnosis not present

## 2022-04-02 DIAGNOSIS — Z7901 Long term (current) use of anticoagulants: Secondary | ICD-10-CM | POA: Insufficient documentation

## 2022-04-02 DIAGNOSIS — I5020 Unspecified systolic (congestive) heart failure: Secondary | ICD-10-CM | POA: Insufficient documentation

## 2022-04-02 DIAGNOSIS — I10 Essential (primary) hypertension: Secondary | ICD-10-CM | POA: Diagnosis not present

## 2022-04-02 DIAGNOSIS — I4821 Permanent atrial fibrillation: Secondary | ICD-10-CM | POA: Diagnosis not present

## 2022-04-02 DIAGNOSIS — N182 Chronic kidney disease, stage 2 (mild): Secondary | ICD-10-CM | POA: Diagnosis not present

## 2022-04-02 DIAGNOSIS — R0609 Other forms of dyspnea: Secondary | ICD-10-CM | POA: Insufficient documentation

## 2022-04-02 DIAGNOSIS — E782 Mixed hyperlipidemia: Secondary | ICD-10-CM | POA: Insufficient documentation

## 2022-04-02 NOTE — Progress Notes (Signed)
Cardiology Office Note:    Date:  04/02/2022   ID:  Natasha Dickson, DOB December 03, 1945, MRN [id number]  PCP:  Tonia Ghent, MD   [facility] Providers Cardiologist:  Ena Dawley, MD {  Referring MD: Tonia Ghent, MD   No chief complaint on file.   History of Present Illness:    Natasha Dickson is a 76 y.o. female with a hx of combined systolic and diastolic heart failure diagnosed in 2014 thought to be tachycardia induced CM in the setting of Afib with RVR, Afib, HTN, HLD, and CKD who was previously followed by Dr. Meda Coffee who now presents to clinic for follow-up.  Per review of the record, the patient has known history of Afib with RVR with TTE showing LVEF 30-35% with diffuse hypokinesis and her stress test was negative for ischemia.  Cardiomyopathy was believed to be secondary to tachycardia associated with atrial fibrillation. LVEF improved to normal 50-55% in 2015 but then decreased to 45-50% in 2016.  Was last seen in clinic on 11/2020 where she was doing well from a CV standpoint. Was tolerating Xarelto without issues.  Was last seen in clinic on 10/03/21 where she was doing well from a CV standpoint.  Today, she is just doing ok today. She does not have as much energy for walking and other activities and has noticed more shortness of breath as well. She is able to do all ADLs and complete her housework but just feels more fatigued. No chest pain, palpitations, orthopnea or worsening LE edema. We discussed the option of stress testing but she prefers to start with an echo first and only proceed with stress testing if EF has changed.   Otherwise, she is doing well. Tolerating medications. No bleeding issues.  She denies any chest pain or peripheral edema. No lightheadedness, headaches, syncope, orthopnea, or PND.   Past Medical History:  Diagnosis Date   Breast cancer ([place]) 1999   Stage I in left breast   Chronic combined systolic and diastolic CHF (congestive heart  failure) ([place])    a. 02/2013 Echo: EF 30-35%, diff HK, biatrial enlargement. b. 2D echo 07/2014 showed mildly dilated LV, EF 45-50%, severely dilated left atrium and moderately dilated right atrium.   CKD (chronic kidney disease), stage II    Closed fracture of unspecified part of neck of femur 2011   Right.  Soreness when standing. (Dr. Durward Fortes)   Nonischemic cardiomyopathy [facility])    a. 02/2013 Lexi CL: No ischemia, prob attenuation vs scar, EF 40%-->Med Rx.   Persistent atrial fibrillation ([place])    a. 02/2013: s/p TEE and attempted cardioversion x 2-->failed-->rate controlled with bb/digoxin, Xarelto initiated.    Past Surgical History:  Procedure Laterality Date   CARDIOVERSION N/A 02/18/2013   Procedure: CARDIOVERSION;  Surgeon: Jolaine Artist, MD;  Location[facility] ENDOSCOPY;  Service: Cardiovascular;  Laterality: N/A;   CARDIOVERSION N/A 02/22/2013   Procedure: CARDIOVERSION;  Surgeon: Fay Records, MD;  Location: [facility] ENDOSCOPY;  Service: Cardiovascular;  Laterality: N/A;   MASTECTOMY  1999   Left breast with saline implant.   PARTIAL HIP ARTHROPLASTY Right    TEE WITHOUT CARDIOVERSION N/A 02/18/2013   Procedure: TRANSESOPHAGEAL ECHOCARDIOGRAM (TEE);  Surgeon: Jolaine Artist, MD;  Location[facility] ENDOSCOPY;  Service: Cardiovascular;  Laterality: N/A;   tubal ligation     TUBAL LIGATION      Current Medications: Current Meds  Medication Sig   atorvastatin (LIPITOR) 10 MG tablet Take 1 tablet (10 mg  total) by mouth daily. Please keep upcoming appt in April 2023 with Dr. Johney Frame before anymore refills. Thank you   cyclobenzaprine (FLEXERIL) 10 MG tablet Take 0.5-1 tablets (5-10 mg total) by mouth 3 (three) times daily as needed for muscle spasms (sedation caution).   diltiazem (CARDIZEM CD) 180 MG 24 hr capsule Take 1 capsule (180 mg total) by mouth daily.   furosemide (LASIX) 20 MG tablet Take 20 mg by mouth as needed for fluid or edema. Pt. Takes 2 tablets 40 mg PRN.   losartan  (COZAAR) 25 MG tablet Take 0.5 tablets (12.5 mg total) by mouth daily.   metoprolol succinate (TOPROL-XL) 100 MG 24 hr tablet TAKE 1 TABLET BY MOUTH ONCE DAILY WITH MEALS OR  IMMEDIATELY  AFTER  A  MEAL   nitroGLYCERIN (NITROSTAT) 0.4 MG SL tablet DISSOLVE ONE TABLET UNDER THE TONGUE EVERY 5 MINUTES AS NEEDED FOR CHEST PAIN.  DO NOT EXCEED A TOTAL OF 3 DOSES IN 15 MINUTES   rivaroxaban (XARELTO) 20 MG TABS tablet Take 1 tablet (20 mg total) by mouth daily.   spironolactone (ALDACTONE) 25 MG tablet Take 1 tablet (25 mg total) by mouth daily.   traMADol (ULTRAM) 50 MG tablet Take 1 tablet (50 mg total) by mouth every 8 (eight) hours as needed.     Allergies:   Patient has no known allergies.   Social History   Socioeconomic History   Marital status: Married    Spouse name: Not on file   Number of children: Not on file   Years of education: Not on file   Highest education level: Not on file  Occupational History   Occupation: Scientist, water quality    Employer: [employer]  Tobacco Use   Smoking status: Never   Smokeless tobacco: Never  Vaping Use   Vaping Use: Never used  Substance and Sexual Activity   Alcohol use: Yes    Alcohol/week: 0.0 standard drinks of alcohol    Comment: RARE   Drug use: No   Sexual activity: Never  Other Topics Concern   Not on file  Social History Narrative   High School grad.   Married 40+ years   Scientist, water quality at [employer] Strain: Low Risk  (10/24/2021)   Overall Financial Resource Strain (CARDIA)    Difficulty of Paying Living Expenses: Not hard at all  Food Insecurity: No Food Insecurity (10/24/2021)   Hunger Vital Sign    Worried About Running Out of Food in the Last Year: Never true    Ran Out of Food in the Last Year: Never true  Transportation Needs: No Transportation Needs (10/24/2021)   PRAPARE - Hydrologist (Medical): No    Lack of Transportation (Non-Medical): No   Physical Activity: Inactive (10/24/2021)   Exercise Vital Sign    Days of Exercise per Week: 0 days    Minutes of Exercise per Session: 0 min  Stress: No Stress Concern Present (10/24/2021)   [place]    Feeling of Stress : Not at all  Social Connections: Moderately Isolated (10/24/2021)   Social Connection and Isolation Panel [NHANES]    Frequency of Communication with Friends and Family: More than three times a week    Frequency of Social Gatherings with Friends and Family: More than three times a week    Attends Religious Services: Never    Marine scientist or  Organizations: No    Attends Archivist Meetings: Never    Marital Status: Married     Family History: The patient's family history includes COPD in her mother; Colon cancer in her father; Congenital heart disease in her son; Heart attack in her father; Heart disease in her brother and father; Hypertension in her father. There is no history of Stroke.  ROS:   Review of Systems  Constitutional:  Positive for malaise/fatigue. Negative for diaphoresis.  HENT:  Negative for ear pain and nosebleeds.   Eyes:  Negative for pain and redness.  Respiratory:  Positive for shortness of breath (exertion). Negative for cough and wheezing.   Cardiovascular:  Positive for palpitations (exertion). Negative for chest pain, orthopnea, claudication and PND.  Gastrointestinal:  Negative for abdominal pain and diarrhea.  Genitourinary:  Negative for flank pain and frequency.  Musculoskeletal:  Negative for joint pain and myalgias.  Neurological:  Negative for dizziness and headaches.  Endo/Heme/Allergies:  Does not bruise/bleed easily.  Psychiatric/Behavioral:  Negative for depression. The patient is not nervous/anxious.      EKGs/Labs/Other Studies Reviewed:    The following studies were reviewed today: TTE 08/06/2014 - Left ventricle: The cavity size was  mildly dilated. Wall   thickness was normal. Systolic function was mildly reduced. The   estimated ejection fraction was in the range of 45% to 50%. - Left atrium: The atrium was severely dilated. - Right atrium: The atrium was moderately dilated. - Atrial septum: No defect or patent foramen ovale was identified.  EKG:  EKG is personally reviewed.  04/01/2022: EKG was not ordered.  10/03/21: Atrial fibrillation, rate 109 bpm 12/04/20: Atrial fibrillation, rate 112  Recent Labs: 10/11/2021: BUN 14; Creatinine, Ser 1.11; Potassium 4.2; Sodium 141  Recent Lipid Panel    Component Value Date/Time   CHOL 128 10/11/2021 1136   TRIG 71 10/11/2021 1136   HDL 48 10/11/2021 1136   CHOLHDL 2.7 10/11/2021 1136   CHOLHDL 4.2 03/11/2016 0911   VLDL 26 03/11/2016 0911   LDLCALC 66 10/11/2021 1136   LDLDIRECT 137.1 03/29/2011 1016     Risk Assessment/Calculations:    CHA2DS2-VASc Score = 5  This indicates a 7.2% annual risk of stroke. The patient's score is based upon: CHF History: 1 HTN History: 1 Diabetes History: 0 Stroke History: 0 Vascular Disease History: 0 Age Score: 2 Gender Score: 1       Physical Exam:    VS:  BP 108/65 (BP Location: Right Arm, Patient Position: Sitting, Cuff Size: Large)   Pulse 90   Ht '5\' 1"'$  (1.549 m)   Wt 213 lb 9.6 oz (96.9 kg)   SpO2 98%   BMI 40.36 kg/m     Wt Readings from Last 3 Encounters:  04/02/22 213 lb 9.6 oz (96.9 kg)  10/24/21 218 lb (98.9 kg)  10/03/21 218 lb 12.8 oz (99.2 kg)     GEN: Comfortable, NAD HEENT: Normal NECK: No JVD; No carotid bruits CARDIAC: Irregularly Irregular, no murmurs, rubs, gallops RESPIRATORY:  Clear to auscultation without rales, wheezing or rhonchi  ABDOMEN: Soft, non-tender, non-distended MUSCULOSKELETAL: Trace bilateral LE edema to the ankles. Warm and dry SKIN: Warm and dry NEUROLOGIC:  Alert and oriented x 3 PSYCHIATRIC:  Normal affect   ASSESSMENT:    1. Systolic heart failure, unspecified  HF chronicity ([place])   2. Mixed hyperlipidemia   3. Essential hypertension   4. Stage 2 chronic kidney disease   5. Chronic combined systolic and diastolic CHF,  NYHA class 2 ([place])   6. Chronic anticoagulation   7. Permanent atrial fibrillation ([place])   8. Dyspnea on exertion     PLAN:    In order of problems listed above:  #Chronic Combined Systolic and Diastolic HF: Thought to be secondary to tachy induced CM in the setting of Afib with RVR. LVEF initially 30-35%->45-50%. Currently with NYHA class II-III symptoms. Will check TTE to reassess EF given slight worsening in energy levels and DOE. Declined stress testing at this time. -Check TTE -Continue metop '100mg'$  XL daily -Continue lasix '20mg'$  prn for LE edema -Continue spironolactone '25mg'$  daily -Continue losartan 12.'5mg'$  daily -Low Na diet  #Persistent Afib: CHADs-vasc 5. Pursuing rate control. Overall well controlled at home.  -Continue metop '100mg'$  XL daily -Continue xarelto '20mg'$  daily -Continue dilt '180mg'$  daily  #DOE: #Fatigue: Chronic but slightly worse than baseline. Discussed option of stress testing, but she wishes to proceed with echo first and only pursue stress testing if EF drops or symptoms progress. Will manage Afib and HF as above. -Manage HF and Afib as above -Check TTE as above -Declined stress testing for now; will pursue pending echo findings or if symptoms progress  #HTN: Well controlled at home running 120/80s. -Continue metop '100mg'$  XL daily -Continue dilt '180mg'$  daily -Continue spironolactone '25mg'$  daily -Continue losartan 12.'5mg'$  daily  #CKD Stage II: Cr stable on most recent labs.  #HLD: -Continue lipitor '10mg'$  daily  Follow-up in 6 months  Medication Adjustments/Labs and Tests Ordered: Current medicines are reviewed at length with the patient today.  Concerns regarding medicines are outlined above.  Orders Placed This Encounter  Procedures   ECHOCARDIOGRAM COMPLETE   No orders of the defined  types were placed in this encounter.   Patient Instructions  Medication Instructions:   Your physician recommends that you continue on your current medications as directed. Please refer to the Current Medication list given to you today.  *If you need a refill on your cardiac medications before your next appointment, please call your pharmacy*   Testing/Procedures:  Your physician has requested that you have an echocardiogram. Echocardiography is a painless test that uses sound waves to create images of your heart. It provides your doctor with information about the size and shape of your heart and how well your heart's chambers and valves are working. This procedure takes approximately one hour. There are no restrictions for this procedure. Please do NOT wear cologne, perfume, aftershave, or lotions (deodorant is allowed). Please arrive 15 minutes prior to your appointment time.    Follow-Up: At [facility], you and your health needs are our priority.  As part of our continuing mission to provide you with exceptional heart care, we have created designated Provider Care Teams.  These Care Teams include your primary Cardiologist (physician) and Advanced Practice Providers (APPs -  Physician Assistants and Nurse Practitioners) who all work together to provide you with the care you need, when you need it.  We recommend signing up for the patient portal called "MyChart".  Sign up information is provided on this After Visit Summary.  MyChart is used to connect with patients for Virtual Visits (Telemedicine).  Patients are able to view lab/test results, encounter notes, upcoming appointments, etc.  Non-urgent messages can be sent to your provider as well.   To learn more about what you can do with MyChart, go to [email or web address].    Your next appointment:   6 month(s)  The format for your next appointment:   In  Person  Provider:   Dr. Johney Frame    Important Information  About Sugar           Burnett Kanaris Ford,acting as a scribe for Freada Bergeron, MD.,have documented all relevant documentation on the behalf of Freada Bergeron, MD,as directed by  Freada Bergeron, MD while in the presence of Freada Bergeron, MD.   I, Freada Bergeron, MD, have reviewed all documentation for this visit. The documentation on 04/02/22 for the exam, diagnosis, procedures, and orders are all accurate and complete.    Signed, Freada Bergeron, MD  04/02/2022 11:57 AM    [place]

## 2022-04-02 NOTE — Patient Instructions (Signed)
Medication Instructions:   Your physician recommends that you continue on your current medications as directed. Please refer to the Current Medication list given to you today.  *If you need a refill on your cardiac medications before your next appointment, please call your pharmacy*   Testing/Procedures:  Your physician has requested that you have an echocardiogram. Echocardiography is a painless test that uses sound waves to create images of your heart. It provides your doctor with information about the size and shape of your heart and how well your heart's chambers and valves are working. This procedure takes approximately one hour. There are no restrictions for this procedure. Please do NOT wear cologne, perfume, aftershave, or lotions (deodorant is allowed). Please arrive 15 minutes prior to your appointment time.    Follow-Up: At [facility], you and your health needs are our priority.  As part of our continuing mission to provide you with exceptional heart care, we have created designated Provider Care Teams.  These Care Teams include your primary Cardiologist (physician) and Advanced Practice Providers (APPs -  Physician Assistants and Nurse Practitioners) who all work together to provide you with the care you need, when you need it.  We recommend signing up for the patient portal called "MyChart".  Sign up information is provided on this After Visit Summary.  MyChart is used to connect with patients for Virtual Visits (Telemedicine).  Patients are able to view lab/test results, encounter notes, upcoming appointments, etc.  Non-urgent messages can be sent to your provider as well.   To learn more about what you can do with MyChart, go to [email or web address].    Your next appointment:   6 month(s)  The format for your next appointment:   In Person  Provider:   Dr. Pemberton   Important Information About Sugar       

## 2022-04-15 ENCOUNTER — Other Ambulatory Visit: Payer: Self-pay

## 2022-04-15 DIAGNOSIS — I4891 Unspecified atrial fibrillation: Secondary | ICD-10-CM

## 2022-04-15 MED ORDER — RIVAROXABAN 20 MG PO TABS: 20.0000 mg | ORAL_TABLET | Freq: Every day | ORAL | 1 refills | Status: DC

## 2022-04-15 NOTE — Telephone Encounter (Signed)
Prescription refill request for Xarelto received.  Indication: Afib  Last office visit: 04/02/22 Natasha Dickson)  Weight: 96.9kg Age: 76 Scr: 1.11 (10/11/21)  CrCl: 65.35m/min  Appropriate dose and refill sent to requested pharmacy.

## 2022-04-16 ENCOUNTER — Ambulatory Visit (HOSPITAL_COMMUNITY): Payer: Medicare Other | Attending: Cardiology

## 2022-04-16 DIAGNOSIS — I5020 Unspecified systolic (congestive) heart failure: Secondary | ICD-10-CM

## 2022-04-16 LAB — ECHOCARDIOGRAM COMPLETE
Area-P 1/2: 4.04 cm2
S' Lateral: 2.4 cm

## 2022-04-17 ENCOUNTER — Telehealth: Payer: Self-pay | Admitting: Cardiology

## 2022-04-17 NOTE — Telephone Encounter (Signed)
Patient was returning call for results. Please advise °

## 2022-04-17 NOTE — Telephone Encounter (Signed)
The patient has been notified of the result and verbalized understanding.  All questions (if any) were answered. Nuala Alpha, LPN [id number] 0:72 PM

## 2022-04-17 NOTE — Telephone Encounter (Signed)
-----   Message from Freada Bergeron, MD sent at 04/17/2022  9:02 AM EST ----- Her echo shows normal pumping function, which is better than prior (this is great news). Her mitral valve leaks a mild amount and her tricuspid valve leaks a mild to moderate amount. This will not cause symptoms but we will monitor this going forward with serial echoes every 2-3 years.

## 2022-04-29 ENCOUNTER — Telehealth: Payer: Self-pay | Admitting: Cardiology

## 2022-04-29 NOTE — Telephone Encounter (Signed)
Addition to previous note:  Patient stated her account number with [facility] is [phone].

## 2022-04-29 NOTE — Telephone Encounter (Signed)
Pt said she called her insurance company and they are working out the bills that [employer] is trying to charge her with for her meds, for she has been using [facility] for sometime and that's where she gets her refills from.  She said her insurance company is working on that and will assist with getting the [facility] bills eliminated.   Informed the pt that I will update [facility] as her main preferred in her chart with [facility] as only secondary for urgent meds that need to be started.  Pt verbalized understanding and agrees with this plan.  Pt was more than gracious for all the assistance provided.

## 2022-04-29 NOTE — Telephone Encounter (Signed)
Patient stated she updated her medical insurance from [place] to [employer].  Patient stated she will need RN Ivy's assistance with [facility] as they are billing her for prescriptions after she changed to [employer].

## 2022-04-29 NOTE — Telephone Encounter (Signed)
Patient called to speak to the nurse, ask that she gives her a call back. Please advise

## 2022-04-29 NOTE — Telephone Encounter (Signed)
Attempted to call the pt back again.  Left her a detailed message informing her that unfortunately, I cannot do anything to assist with [facility] charging her for her prescriptions.  Did advise her that our office can send in any refills she may need to her mail order [facility], but I cannot assist with billing issues through [place] vs mail order, for she will need to call her insurance company about this, for they will be the ones to help her with this matter.   Advised her to call back with any additional questions/concerns regarding message left.  Also advised her if she is needing our office to send in any refills of her cardiac meds to her mail order [facility], then she should have the operator connect her to our refill dept to assist with this matter.

## 2022-04-29 NOTE — Telephone Encounter (Signed)
Left the pt a message to call the office back. 

## 2022-04-29 NOTE — Telephone Encounter (Signed)
Patient was returning call. Please advise ?

## 2022-04-29 NOTE — Telephone Encounter (Signed)
Left the pt a message to call the office back and requested that she provide a detailed message to provide the operator, so that if this is an urgent matter, they can get her to triage to assist with that matter.

## 2022-05-07 ENCOUNTER — Telehealth: Payer: Self-pay | Admitting: Cardiology

## 2022-05-07 NOTE — Telephone Encounter (Signed)
Daughter would like a callback regarding pt's condition. Daughter states that pt has been weak, not eating or drinking. She would also like a copy of pt's last office visit summary. Please advise

## 2022-05-07 NOTE — Telephone Encounter (Signed)
Pts daughter is calling to report that the pt has not eaten or had any water since thanksgiving. She states the only intake of fluid she's had is mountain dew.   Daughter states the pts back and legs hurt, and she is not producing normal amounts of urine, and she is feeling very weak and fatigued.   Daughter states the pt is not taking her medication as she's suppose to, and her dementia seems worse.   Daughter states her color looks terrible as well.    Daughter states she is unsure what she should do for the pt.  Advised the pts daughter to take her to the hospital for further multi-system work-up, testing, and fluids.  Informed her that she very well could have an infection causing these issues, like a UTI.   Daughter states the pt will refuse to go to the ER, so she will reach out to her PCP for an appointment and further treatment, for she states PCP has treated her for this in the past.   Daughter states she will call the PCP now.  Did highly advise her again to take the pt to the hospital for further treatment.  She will seek PCP advise for now.   Off note:  The pts daughter Margaretha Sheffield did request to get the pts medical records from a cardiac standpoint sent to her.  Advised her that I cannot release her records, but I will reach out to our medical records team, to further assist with this need.  She is aware they will touch base with her about this matter.  Daughter agreed to this plan.  Will send this message to our HIM Dept to assist with releasing records and Dr. Johney Frame to make her aware of this plan.

## 2022-05-07 NOTE — Telephone Encounter (Signed)
Zarin, Hagmann - 05/07/2022  9:03 AM Freada Bergeron, MD  Sent: Tue May 07, 2022  1:45 PM  To: Nuala Alpha, LPN         Message  Agree with your plan!

## 2022-05-16 ENCOUNTER — Telehealth: Payer: Self-pay | Admitting: Family Medicine

## 2022-05-16 NOTE — Telephone Encounter (Signed)
I am just now getting to this message. Per message she was only available for 10 minutes after she called this am. Will try to call her in the morning if I am able.

## 2022-05-16 NOTE — Telephone Encounter (Signed)
Patient daughter Natasha Dickson will be with patient tomorrow for her appointment. She has concerns about her mom Natasha Dickson and would like to discuss these issues privately without her mom knowing about it. She doesn't want to break the trust she has with her mom. She is available for the next 10 minutes and after that she will not be able to talk until tomorrow between 8 AM - 10 AM. If she doesn't hear from you all she will just write everything down and hand it to you all tomorrow. She can be reached at [phone]. Thank you!

## 2022-05-17 ENCOUNTER — Ambulatory Visit (INDEPENDENT_AMBULATORY_CARE_PROVIDER_SITE_OTHER): Payer: Medicare Other | Admitting: Family Medicine

## 2022-05-17 ENCOUNTER — Encounter: Payer: Self-pay | Admitting: Family Medicine

## 2022-05-17 VITALS — BP 128/70 | HR 59 | Temp 97.2°F | Ht 61.0 in | Wt 210.0 lb

## 2022-05-17 DIAGNOSIS — R609 Edema, unspecified: Secondary | ICD-10-CM

## 2022-05-17 DIAGNOSIS — Z79899 Other long term (current) drug therapy: Secondary | ICD-10-CM | POA: Diagnosis not present

## 2022-05-17 DIAGNOSIS — L989 Disorder of the skin and subcutaneous tissue, unspecified: Secondary | ICD-10-CM | POA: Diagnosis not present

## 2022-05-17 DIAGNOSIS — I5042 Chronic combined systolic (congestive) and diastolic (congestive) heart failure: Secondary | ICD-10-CM

## 2022-05-17 DIAGNOSIS — M791 Myalgia, unspecified site: Secondary | ICD-10-CM

## 2022-05-17 DIAGNOSIS — R059 Cough, unspecified: Secondary | ICD-10-CM | POA: Diagnosis not present

## 2022-05-17 DIAGNOSIS — I10 Essential (primary) hypertension: Secondary | ICD-10-CM

## 2022-05-17 DIAGNOSIS — N182 Chronic kidney disease, stage 2 (mild): Secondary | ICD-10-CM

## 2022-05-17 DIAGNOSIS — R413 Other amnesia: Secondary | ICD-10-CM | POA: Diagnosis not present

## 2022-05-17 DIAGNOSIS — Z23 Encounter for immunization: Secondary | ICD-10-CM | POA: Diagnosis not present

## 2022-05-17 DIAGNOSIS — R251 Tremor, unspecified: Secondary | ICD-10-CM

## 2022-05-17 MED ORDER — SPIRONOLACTONE 25 MG PO TABS: ORAL_TABLET | ORAL | 3 refills | Status: DC

## 2022-05-17 MED ORDER — ATORVASTATIN CALCIUM 10 MG PO TABS: 10.0000 mg | ORAL_TABLET | Freq: Every day | ORAL | 3 refills | Status: DC

## 2022-05-17 MED ORDER — FUROSEMIDE 20 MG PO TABS: 40.0000 mg | ORAL_TABLET | Freq: Every day | ORAL | Status: DC

## 2022-05-17 NOTE — Progress Notes (Signed)
Here today with daughter.    Fatigue, had echo done.  D/w pt.    Dec mobility possibly due to to back pain and fatigue.  Lower back pain usually, occasionally some in the B scapular area.  Flexeril helped.    Still with sputum/cough.  Since Thanksgiving. No fevers.    Had been off lasix since July, has only used as needed.  Then restarted in the meantime, in last few days.    No resting tremor but noted in R hand tremor with holding a heavier plate.  Family noted patient had some memory changes. Dec in appetite.   Family noticed patient was picking at his skin lesion on the scalp.  Meds, vitals, and allergies reviewed.   ROS: Per HPI unless specifically indicated in ROS section   GEN: nad, alert and oriented HEENT: ncat except for scalp lesion, see below. NECK: supple w/o LA CV: IRR PULM: ctab, no inc wob ABD: soft, +bs EXT: 2+ BLE edema SKIN: Irritated scabbed 12 x 7 mm area on the L anterior scalp.   No resting hand tremor but right hand tremor noted with holding a relatively heavy object and extension (she was tested holding to clip boards in the right hand).  MMSE 29/30.  -1 for recall.

## 2022-05-17 NOTE — Telephone Encounter (Signed)
All concerns were discussed at patients OV.

## 2022-05-17 NOTE — Patient Instructions (Signed)
Hold spironolactone and atorvastatin for now.  Increase lasix to 2 tabs a day.  If significant decrease in swelling, then take 1 tab a day.  Refer to dermatology.  Recheck in 1 week, appointment earlier in AM (before noon if possible). Take care.  Glad to see you.

## 2022-05-19 DIAGNOSIS — R413 Other amnesia: Secondary | ICD-10-CM | POA: Insufficient documentation

## 2022-05-19 DIAGNOSIS — R251 Tremor, unspecified: Secondary | ICD-10-CM | POA: Insufficient documentation

## 2022-05-19 DIAGNOSIS — M791 Myalgia, unspecified site: Secondary | ICD-10-CM | POA: Insufficient documentation

## 2022-05-19 DIAGNOSIS — R609 Edema, unspecified: Secondary | ICD-10-CM | POA: Insufficient documentation

## 2022-05-19 NOTE — Assessment & Plan Note (Signed)
Along with fatigue.  Unclear if any of this is statin related.  Hold atorvastatin in the meantime.

## 2022-05-19 NOTE — Assessment & Plan Note (Signed)
Hold spironolactone. Increase lasix to 2 tabs a day.  If significant decrease in swelling, then take 1 tab a day.  Recheck in 1 week.  Can recheck labs and consider chest x-ray at that point.

## 2022-05-19 NOTE — Assessment & Plan Note (Signed)
MMSE 29/30.  -1 for recall.  We can reassess on follow-up.

## 2022-05-19 NOTE — Assessment & Plan Note (Signed)
No resting tremor.  We can reevaluate on follow-up and collect labs at that point.

## 2022-05-19 NOTE — Assessment & Plan Note (Signed)
Refer to dermatology 

## 2022-05-19 NOTE — Assessment & Plan Note (Signed)
Lungs clear.  Will increase Lasix, see edema discussion.  Consider chest x-ray on follow-up.

## 2022-05-20 ENCOUNTER — Encounter: Payer: Self-pay | Admitting: *Deleted

## 2022-05-24 ENCOUNTER — Ambulatory Visit (INDEPENDENT_AMBULATORY_CARE_PROVIDER_SITE_OTHER)
Admission: RE | Admit: 2022-05-24 | Discharge: 2022-05-24 | Disposition: A | Discharge status: Home / Self Care | Payer: Medicare Other | Source: Ambulatory Visit | Attending: Family Medicine | Admitting: Family Medicine

## 2022-05-24 ENCOUNTER — Encounter: Payer: Self-pay | Admitting: Family Medicine

## 2022-05-24 ENCOUNTER — Ambulatory Visit (INDEPENDENT_AMBULATORY_CARE_PROVIDER_SITE_OTHER): Payer: Medicare Other | Admitting: Family Medicine

## 2022-05-24 VITALS — BP 106/68 | HR 61 | Temp 97.0°F | Ht 61.0 in | Wt 205.0 lb

## 2022-05-24 DIAGNOSIS — R0602 Shortness of breath: Secondary | ICD-10-CM | POA: Diagnosis not present

## 2022-05-24 DIAGNOSIS — R059 Cough, unspecified: Secondary | ICD-10-CM | POA: Diagnosis not present

## 2022-05-24 DIAGNOSIS — E782 Mixed hyperlipidemia: Secondary | ICD-10-CM | POA: Diagnosis not present

## 2022-05-24 DIAGNOSIS — I420 Dilated cardiomyopathy: Secondary | ICD-10-CM

## 2022-05-24 DIAGNOSIS — Z79899 Other long term (current) drug therapy: Secondary | ICD-10-CM | POA: Diagnosis not present

## 2022-05-24 DIAGNOSIS — I10 Essential (primary) hypertension: Secondary | ICD-10-CM | POA: Diagnosis not present

## 2022-05-24 DIAGNOSIS — R413 Other amnesia: Secondary | ICD-10-CM

## 2022-05-24 LAB — CBC WITH DIFFERENTIAL/PLATELET
Basophils Absolute: 0 10*3/uL (ref 0.0–0.1)
Basophils Relative: 0.5 % (ref 0.0–3.0)
Eosinophils Absolute: 0.2 10*3/uL (ref 0.0–0.7)
Eosinophils Relative: 2 % (ref 0.0–5.0)
HCT: 31.2 % — ABNORMAL LOW (ref 36.0–46.0)
Hemoglobin: 9.7 g/dL — ABNORMAL LOW (ref 12.0–15.0)
Lymphocytes Relative: 15.5 % (ref 12.0–46.0)
Lymphs Abs: 1.4 10*3/uL (ref 0.7–4.0)
MCHC: 31 g/dL (ref 30.0–36.0)
MCV: 76 fl — ABNORMAL LOW (ref 78.0–100.0)
Monocytes Absolute: 0.7 10*3/uL (ref 0.1–1.0)
Monocytes Relative: 7.3 % (ref 3.0–12.0)
Neutro Abs: 6.9 10*3/uL (ref 1.4–7.7)
Neutrophils Relative %: 74.7 % (ref 43.0–77.0)
Platelets: 467 10*3/uL — ABNORMAL HIGH (ref 150.0–400.0)
RBC: 4.1 Mil/uL (ref 3.87–5.11)
RDW: 16.7 % — ABNORMAL HIGH (ref 11.5–15.5)
WBC: 9.3 10*3/uL (ref 4.0–10.5)

## 2022-05-24 LAB — LIPID PANEL
Cholesterol: 147 mg/dL (ref 0–200)
HDL: 41.5 mg/dL (ref >39.00)
LDL Cholesterol: 86 mg/dL (ref 0–99)
NonHDL: 105.54
Total CHOL/HDL Ratio: 4
Triglycerides: 98 mg/dL (ref 0.0–149.0)
VLDL: 19.6 mg/dL (ref 0.0–40.0)

## 2022-05-24 LAB — COMPREHENSIVE METABOLIC PANEL
ALT: 5 U/L (ref 0–35)
AST: 10 U/L (ref 0–37)
Albumin: 3.6 g/dL (ref 3.5–5.2)
Alkaline Phosphatase: 94 U/L (ref 39–117)
BUN: 20 mg/dL (ref 6–23)
CO2: 28 mEq/L (ref 19–32)
Calcium: 8.7 mg/dL (ref 8.4–10.5)
Chloride: 98 mEq/L (ref 96–112)
Creatinine, Ser: 1.14 mg/dL (ref 0.40–1.20)
GFR: 46.87 mL/min — ABNORMAL LOW (ref >60.00)
Glucose, Bld: 107 mg/dL — ABNORMAL HIGH (ref 70–99)
Potassium: 3.8 mEq/L (ref 3.5–5.1)
Sodium: 136 mEq/L (ref 135–145)
Total Bilirubin: 0.7 mg/dL (ref 0.2–1.2)
Total Protein: 6.4 g/dL (ref 6.0–8.3)

## 2022-05-24 LAB — TSH: TSH: 3.44 u[IU]/mL (ref 0.35–5.50)

## 2022-05-24 LAB — VITAMIN B12: Vitamin B-12: 166 pg/mL — ABNORMAL LOW (ref 211–911)

## 2022-05-24 LAB — BRAIN NATRIURETIC PEPTIDE: Pro B Natriuretic peptide (BNP): 203 pg/mL — ABNORMAL HIGH (ref 0.0–100.0)

## 2022-05-24 MED ORDER — FUROSEMIDE 20 MG PO TABS: 20.0000 mg | ORAL_TABLET | Freq: Every day | ORAL | Status: DC

## 2022-05-24 MED ORDER — LOSARTAN POTASSIUM 25 MG PO TABS: ORAL_TABLET | ORAL | 3 refills | Status: DC

## 2022-05-24 MED ORDER — CYCLOBENZAPRINE HCL 10 MG PO TABS: 5.0000 mg | ORAL_TABLET | Freq: Three times a day (TID) | ORAL | 1 refills | Status: DC | PRN

## 2022-05-24 NOTE — Progress Notes (Unsigned)
Here with daughter today.  Per daughter, pt got confused, couldn't get the TV turned off in the middle of the night, it woke the daughter up.  Per daughter, longstanding hoarding.    "A little on the weak side" episodically a little lightheaded.  BP 106/68.    Off spironolactone and atorvastatin in the meantime. She wasn't able to increase lasix in the meantime, above 1 tab a day.    Scalp lesion hx d/w pt, with dermatology referral ordered.    See about mychart access.  Need someone to call pt's daughter- [phone].  Patient gave permission.    Cough continues.  Still having pain across the B shoulder blades.  Some chronic SOBOE, ie with walking. Improves with rest.    Meds, vitals, and allergies reviewed.   ROS: Per HPI unless specifically indicated in ROS section   Possible slight dec in BLE, slightly less tight BLE.

## 2022-05-24 NOTE — Patient Instructions (Signed)
Go to the lab on the way out.   If you have mychart we'll likely use that to update you.    Hold losartan and update me about your BP early next week.  Take care.  Glad to see you.

## 2022-05-26 ENCOUNTER — Encounter: Payer: Self-pay | Admitting: Family Medicine

## 2022-05-26 ENCOUNTER — Telehealth: Payer: Self-pay | Admitting: Family Medicine

## 2022-05-26 DIAGNOSIS — D649 Anemia, unspecified: Secondary | ICD-10-CM

## 2022-05-26 DIAGNOSIS — E538 Deficiency of other specified B group vitamins: Secondary | ICD-10-CM

## 2022-05-26 MED ORDER — CYANOCOBALAMIN 1000 MCG/ML IJ SOLN: INTRAMUSCULAR | Status: DC

## 2022-05-26 MED ORDER — CYANOCOBALAMIN 1000 MCG/ML IJ SOLN: INTRAMUSCULAR | 0 refills | Status: DC

## 2022-05-26 NOTE — Assessment & Plan Note (Signed)
Of unclear source.  See notes on labs.  Will examine labs for reversible causes initially. 30 minutes were devoted to patient care in this encounter (this includes time spent reviewing the patient's file/history, interviewing and examining the patient, counseling/reviewing plan with patient).

## 2022-05-26 NOTE — Assessment & Plan Note (Addendum)
Discussed options.  Off spironolactone and atorvastatin in the meantime.  She was not able to increase Lasix.  I asked her to hold losartan and update me about BP early next week.  See notes on labs.  See notes on chest x-ray.

## 2022-05-26 NOTE — Telephone Encounter (Signed)
Please see result note.  I would like this patient's daughter to pick up and IFOB.  Is there any way for an iron/ferritin level to be added onto her previous CBC that was most recently done?  Please let me know.

## 2022-05-27 ENCOUNTER — Other Ambulatory Visit: Payer: Self-pay

## 2022-05-27 ENCOUNTER — Inpatient Hospital Stay (HOSPITAL_COMMUNITY)
Admission: EM | Admit: 2022-05-27 | Discharge: 2022-06-12 | DRG: 329 | Disposition: A | Discharge status: Skilled Nursing Facility | Payer: Medicare Other | Attending: Family Medicine | Admitting: Family Medicine

## 2022-05-27 ENCOUNTER — Emergency Department (HOSPITAL_COMMUNITY): Payer: Medicare Other

## 2022-05-27 ENCOUNTER — Observation Stay (HOSPITAL_COMMUNITY): Payer: Medicare Other

## 2022-05-27 ENCOUNTER — Encounter: Payer: Self-pay | Admitting: Family Medicine

## 2022-05-27 ENCOUNTER — Encounter (HOSPITAL_COMMUNITY): Payer: Self-pay

## 2022-05-27 DIAGNOSIS — E785 Hyperlipidemia, unspecified: Secondary | ICD-10-CM | POA: Diagnosis not present

## 2022-05-27 DIAGNOSIS — Z79899 Other long term (current) drug therapy: Secondary | ICD-10-CM

## 2022-05-27 DIAGNOSIS — D62 Acute posthemorrhagic anemia: Secondary | ICD-10-CM | POA: Diagnosis not present

## 2022-05-27 DIAGNOSIS — K5730 Diverticulosis of large intestine without perforation or abscess without bleeding: Secondary | ICD-10-CM

## 2022-05-27 DIAGNOSIS — I82613 Acute embolism and thrombosis of superficial veins of upper extremity, bilateral: Secondary | ICD-10-CM | POA: Diagnosis not present

## 2022-05-27 DIAGNOSIS — D509 Iron deficiency anemia, unspecified: Secondary | ICD-10-CM

## 2022-05-27 DIAGNOSIS — I428 Other cardiomyopathies: Secondary | ICD-10-CM | POA: Diagnosis present

## 2022-05-27 DIAGNOSIS — E876 Hypokalemia: Secondary | ICD-10-CM | POA: Diagnosis not present

## 2022-05-27 DIAGNOSIS — M6281 Muscle weakness (generalized): Secondary | ICD-10-CM | POA: Diagnosis not present

## 2022-05-27 DIAGNOSIS — K6389 Other specified diseases of intestine: Secondary | ICD-10-CM

## 2022-05-27 DIAGNOSIS — D124 Benign neoplasm of descending colon: Secondary | ICD-10-CM

## 2022-05-27 DIAGNOSIS — R41841 Cognitive communication deficit: Secondary | ICD-10-CM | POA: Diagnosis not present

## 2022-05-27 DIAGNOSIS — K449 Diaphragmatic hernia without obstruction or gangrene: Secondary | ICD-10-CM

## 2022-05-27 DIAGNOSIS — R2681 Unsteadiness on feet: Secondary | ICD-10-CM | POA: Diagnosis not present

## 2022-05-27 DIAGNOSIS — E669 Obesity, unspecified: Secondary | ICD-10-CM | POA: Diagnosis present

## 2022-05-27 DIAGNOSIS — I951 Orthostatic hypotension: Secondary | ICD-10-CM | POA: Diagnosis present

## 2022-05-27 DIAGNOSIS — D6869 Other thrombophilia: Secondary | ICD-10-CM | POA: Diagnosis present

## 2022-05-27 DIAGNOSIS — I10 Essential (primary) hypertension: Secondary | ICD-10-CM

## 2022-05-27 DIAGNOSIS — I130 Hypertensive heart and chronic kidney disease with heart failure and stage 1 through stage 4 chronic kidney disease, or unspecified chronic kidney disease: Secondary | ICD-10-CM | POA: Diagnosis not present

## 2022-05-27 DIAGNOSIS — Z79891 Long term (current) use of opiate analgesic: Secondary | ICD-10-CM

## 2022-05-27 DIAGNOSIS — R195 Other fecal abnormalities: Secondary | ICD-10-CM | POA: Diagnosis not present

## 2022-05-27 DIAGNOSIS — I8002 Phlebitis and thrombophlebitis of superficial vessels of left lower extremity: Secondary | ICD-10-CM | POA: Diagnosis not present

## 2022-05-27 DIAGNOSIS — I700 Atherosclerosis of aorta: Secondary | ICD-10-CM | POA: Diagnosis present

## 2022-05-27 DIAGNOSIS — D125 Benign neoplasm of sigmoid colon: Secondary | ICD-10-CM | POA: Diagnosis not present

## 2022-05-27 DIAGNOSIS — K317 Polyp of stomach and duodenum: Secondary | ICD-10-CM | POA: Diagnosis present

## 2022-05-27 DIAGNOSIS — D519 Vitamin B12 deficiency anemia, unspecified: Secondary | ICD-10-CM | POA: Diagnosis not present

## 2022-05-27 DIAGNOSIS — R053 Chronic cough: Secondary | ICD-10-CM | POA: Diagnosis not present

## 2022-05-27 DIAGNOSIS — K2981 Duodenitis with bleeding: Secondary | ICD-10-CM | POA: Diagnosis not present

## 2022-05-27 DIAGNOSIS — I5030 Unspecified diastolic (congestive) heart failure: Secondary | ICD-10-CM | POA: Insufficient documentation

## 2022-05-27 DIAGNOSIS — D120 Benign neoplasm of cecum: Secondary | ICD-10-CM | POA: Diagnosis not present

## 2022-05-27 DIAGNOSIS — R531 Weakness: Secondary | ICD-10-CM | POA: Diagnosis present

## 2022-05-27 DIAGNOSIS — Z96641 Presence of right artificial hip joint: Secondary | ICD-10-CM | POA: Diagnosis present

## 2022-05-27 DIAGNOSIS — D123 Benign neoplasm of transverse colon: Secondary | ICD-10-CM | POA: Diagnosis not present

## 2022-05-27 DIAGNOSIS — F423 Hoarding disorder: Secondary | ICD-10-CM | POA: Diagnosis present

## 2022-05-27 DIAGNOSIS — Z7401 Bed confinement status: Secondary | ICD-10-CM | POA: Diagnosis not present

## 2022-05-27 DIAGNOSIS — N200 Calculus of kidney: Secondary | ICD-10-CM | POA: Diagnosis not present

## 2022-05-27 DIAGNOSIS — Z9012 Acquired absence of left breast and nipple: Secondary | ICD-10-CM

## 2022-05-27 DIAGNOSIS — I110 Hypertensive heart disease with heart failure: Secondary | ICD-10-CM | POA: Diagnosis not present

## 2022-05-27 DIAGNOSIS — I5032 Chronic diastolic (congestive) heart failure: Secondary | ICD-10-CM | POA: Diagnosis not present

## 2022-05-27 DIAGNOSIS — F0390 Unspecified dementia without behavioral disturbance: Secondary | ICD-10-CM | POA: Diagnosis not present

## 2022-05-27 DIAGNOSIS — E1122 Type 2 diabetes mellitus with diabetic chronic kidney disease: Secondary | ICD-10-CM | POA: Diagnosis present

## 2022-05-27 DIAGNOSIS — J9601 Acute respiratory failure with hypoxia: Secondary | ICD-10-CM | POA: Diagnosis not present

## 2022-05-27 DIAGNOSIS — I5042 Chronic combined systolic (congestive) and diastolic (congestive) heart failure: Secondary | ICD-10-CM | POA: Diagnosis not present

## 2022-05-27 DIAGNOSIS — K2970 Gastritis, unspecified, without bleeding: Secondary | ICD-10-CM | POA: Diagnosis not present

## 2022-05-27 DIAGNOSIS — Z8249 Family history of ischemic heart disease and other diseases of the circulatory system: Secondary | ICD-10-CM

## 2022-05-27 DIAGNOSIS — R2689 Other abnormalities of gait and mobility: Secondary | ICD-10-CM | POA: Diagnosis not present

## 2022-05-27 DIAGNOSIS — I358 Other nonrheumatic aortic valve disorders: Secondary | ICD-10-CM | POA: Diagnosis present

## 2022-05-27 DIAGNOSIS — N1831 Chronic kidney disease, stage 3a: Secondary | ICD-10-CM | POA: Diagnosis present

## 2022-05-27 DIAGNOSIS — Z1152 Encounter for screening for COVID-19: Secondary | ICD-10-CM

## 2022-05-27 DIAGNOSIS — K648 Other hemorrhoids: Secondary | ICD-10-CM | POA: Diagnosis present

## 2022-05-27 DIAGNOSIS — I4891 Unspecified atrial fibrillation: Secondary | ICD-10-CM

## 2022-05-27 DIAGNOSIS — K31A0 Gastric intestinal metaplasia, unspecified: Secondary | ICD-10-CM | POA: Diagnosis not present

## 2022-05-27 DIAGNOSIS — E538 Deficiency of other specified B group vitamins: Secondary | ICD-10-CM | POA: Diagnosis not present

## 2022-05-27 DIAGNOSIS — D500 Iron deficiency anemia secondary to blood loss (chronic): Secondary | ICD-10-CM

## 2022-05-27 DIAGNOSIS — I82A11 Acute embolism and thrombosis of right axillary vein: Secondary | ICD-10-CM | POA: Diagnosis not present

## 2022-05-27 DIAGNOSIS — R918 Other nonspecific abnormal finding of lung field: Secondary | ICD-10-CM | POA: Diagnosis not present

## 2022-05-27 DIAGNOSIS — M7989 Other specified soft tissue disorders: Secondary | ICD-10-CM | POA: Diagnosis not present

## 2022-05-27 DIAGNOSIS — I2510 Atherosclerotic heart disease of native coronary artery without angina pectoris: Secondary | ICD-10-CM | POA: Diagnosis present

## 2022-05-27 DIAGNOSIS — R059 Cough, unspecified: Secondary | ICD-10-CM | POA: Diagnosis not present

## 2022-05-27 DIAGNOSIS — C180 Malignant neoplasm of cecum: Secondary | ICD-10-CM | POA: Diagnosis not present

## 2022-05-27 DIAGNOSIS — R4189 Other symptoms and signs involving cognitive functions and awareness: Secondary | ICD-10-CM | POA: Diagnosis present

## 2022-05-27 DIAGNOSIS — Z7901 Long term (current) use of anticoagulants: Secondary | ICD-10-CM

## 2022-05-27 DIAGNOSIS — I5043 Acute on chronic combined systolic (congestive) and diastolic (congestive) heart failure: Secondary | ICD-10-CM | POA: Diagnosis not present

## 2022-05-27 DIAGNOSIS — I361 Nonrheumatic tricuspid (valve) insufficiency: Secondary | ICD-10-CM | POA: Diagnosis present

## 2022-05-27 DIAGNOSIS — J101 Influenza due to other identified influenza virus with other respiratory manifestations: Secondary | ICD-10-CM | POA: Diagnosis not present

## 2022-05-27 DIAGNOSIS — I509 Heart failure, unspecified: Secondary | ICD-10-CM | POA: Diagnosis not present

## 2022-05-27 DIAGNOSIS — Z853 Personal history of malignant neoplasm of breast: Secondary | ICD-10-CM

## 2022-05-27 DIAGNOSIS — K56609 Unspecified intestinal obstruction, unspecified as to partial versus complete obstruction: Secondary | ICD-10-CM | POA: Diagnosis not present

## 2022-05-27 DIAGNOSIS — I4819 Other persistent atrial fibrillation: Secondary | ICD-10-CM | POA: Diagnosis present

## 2022-05-27 DIAGNOSIS — K635 Polyp of colon: Secondary | ICD-10-CM | POA: Diagnosis not present

## 2022-05-27 DIAGNOSIS — Z800 Family history of malignant neoplasm of digestive organs: Secondary | ICD-10-CM

## 2022-05-27 DIAGNOSIS — R634 Abnormal weight loss: Secondary | ICD-10-CM | POA: Diagnosis not present

## 2022-05-27 DIAGNOSIS — K56699 Other intestinal obstruction unspecified as to partial versus complete obstruction: Secondary | ICD-10-CM | POA: Diagnosis not present

## 2022-05-27 DIAGNOSIS — Z452 Encounter for adjustment and management of vascular access device: Secondary | ICD-10-CM | POA: Diagnosis not present

## 2022-05-27 DIAGNOSIS — Z85828 Personal history of other malignant neoplasm of skin: Secondary | ICD-10-CM

## 2022-05-27 DIAGNOSIS — R4182 Altered mental status, unspecified: Secondary | ICD-10-CM | POA: Diagnosis not present

## 2022-05-27 DIAGNOSIS — R0602 Shortness of breath: Secondary | ICD-10-CM | POA: Diagnosis not present

## 2022-05-27 DIAGNOSIS — R5381 Other malaise: Secondary | ICD-10-CM | POA: Diagnosis not present

## 2022-05-27 DIAGNOSIS — D649 Anemia, unspecified: Secondary | ICD-10-CM | POA: Diagnosis not present

## 2022-05-27 DIAGNOSIS — M549 Dorsalgia, unspecified: Secondary | ICD-10-CM | POA: Diagnosis present

## 2022-05-27 DIAGNOSIS — K567 Ileus, unspecified: Secondary | ICD-10-CM | POA: Diagnosis present

## 2022-05-27 DIAGNOSIS — C189 Malignant neoplasm of colon, unspecified: Secondary | ICD-10-CM | POA: Diagnosis not present

## 2022-05-27 DIAGNOSIS — K319 Disease of stomach and duodenum, unspecified: Secondary | ICD-10-CM | POA: Diagnosis not present

## 2022-05-27 DIAGNOSIS — Z6839 Body mass index (BMI) 39.0-39.9, adult: Secondary | ICD-10-CM

## 2022-05-27 DIAGNOSIS — K9189 Other postprocedural complications and disorders of digestive system: Secondary | ICD-10-CM | POA: Diagnosis not present

## 2022-05-27 DIAGNOSIS — R238 Other skin changes: Secondary | ICD-10-CM | POA: Diagnosis not present

## 2022-05-27 DIAGNOSIS — K2990 Gastroduodenitis, unspecified, without bleeding: Secondary | ICD-10-CM | POA: Diagnosis present

## 2022-05-27 LAB — COMPREHENSIVE METABOLIC PANEL
ALT: 10 U/L (ref 0–44)
AST: 16 U/L (ref 15–41)
Albumin: 2.9 g/dL — ABNORMAL LOW (ref 3.5–5.0)
Alkaline Phosphatase: 84 U/L (ref 38–126)
Anion gap: 13 (ref 5–15)
BUN: 15 mg/dL (ref 8–23)
CO2: 23 mmol/L (ref 22–32)
Calcium: 9.6 mg/dL (ref 8.9–10.3)
Chloride: 100 mmol/L (ref 98–111)
Creatinine, Ser: 1.22 mg/dL — ABNORMAL HIGH (ref 0.44–1.00)
GFR, Estimated: 46 mL/min — ABNORMAL LOW (ref >60)
Glucose, Bld: 105 mg/dL — ABNORMAL HIGH (ref 70–99)
Potassium: 3 mmol/L — ABNORMAL LOW (ref 3.5–5.1)
Sodium: 136 mmol/L (ref 135–145)
Total Bilirubin: 0.7 mg/dL (ref 0.3–1.2)
Total Protein: 6.5 g/dL (ref 6.5–8.1)

## 2022-05-27 LAB — TROPONIN I (HIGH SENSITIVITY)
Troponin I (High Sensitivity): 5 ng/L (ref <18)
Troponin I (High Sensitivity): 5 ng/L (ref <18)

## 2022-05-27 LAB — CBC WITH DIFFERENTIAL/PLATELET
Abs Immature Granulocytes: 0.07 10*3/uL (ref 0.00–0.07)
Basophils Absolute: 0.1 10*3/uL (ref 0.0–0.1)
Basophils Relative: 1 %
Eosinophils Absolute: 0.1 10*3/uL (ref 0.0–0.5)
Eosinophils Relative: 1 %
HCT: 31.2 % — ABNORMAL LOW (ref 36.0–46.0)
Hemoglobin: 9 g/dL — ABNORMAL LOW (ref 12.0–15.0)
Immature Granulocytes: 1 %
Lymphocytes Relative: 19 %
Lymphs Abs: 1.7 10*3/uL (ref 0.7–4.0)
MCH: 23 pg — ABNORMAL LOW (ref 26.0–34.0)
MCHC: 28.8 g/dL — ABNORMAL LOW (ref 30.0–36.0)
MCV: 79.8 fL — ABNORMAL LOW (ref 80.0–100.0)
Monocytes Absolute: 0.8 10*3/uL (ref 0.1–1.0)
Monocytes Relative: 9 %
Neutro Abs: 5.9 10*3/uL (ref 1.7–7.7)
Neutrophils Relative %: 69 %
Platelets: 467 10*3/uL — ABNORMAL HIGH (ref 150–400)
RBC: 3.91 MIL/uL (ref 3.87–5.11)
RDW: 16.5 % — ABNORMAL HIGH (ref 11.5–15.5)
WBC: 8.5 10*3/uL (ref 4.0–10.5)
nRBC: 0 % (ref 0.0–0.2)

## 2022-05-27 LAB — RESP PANEL BY RT-PCR (RSV, FLU A&B, COVID)  RVPGX2
Influenza A by PCR: NEGATIVE
Influenza B by PCR: NEGATIVE
Resp Syncytial Virus by PCR: NEGATIVE
SARS Coronavirus 2 by RT PCR: NEGATIVE

## 2022-05-27 LAB — POC OCCULT BLOOD, ED: Fecal Occult Bld: POSITIVE — AB

## 2022-05-27 LAB — BRAIN NATRIURETIC PEPTIDE: B Natriuretic Peptide: 225.7 pg/mL — ABNORMAL HIGH (ref 0.0–100.0)

## 2022-05-27 MED ORDER — AMIODARONE HCL IN DEXTROSE 360-4.14 MG/200ML-% IV SOLN
60.0000 mg/h | INTRAVENOUS | Status: DC
  Administered 2022-05-27 (×2): 60 mg/h via INTRAVENOUS
  Filled 2022-05-27 (×2): qty 200

## 2022-05-27 MED ORDER — AMIODARONE HCL IN DEXTROSE 360-4.14 MG/200ML-% IV SOLN
30.0000 mg/h | INTRAVENOUS | Status: DC
  Administered 2022-05-27 – 2022-05-31 (×8): 30 mg/h via INTRAVENOUS
  Filled 2022-05-27 (×7): qty 200

## 2022-05-27 MED ORDER — POTASSIUM CHLORIDE CRYS ER 20 MEQ PO TBCR
40.0000 meq | EXTENDED_RELEASE_TABLET | Freq: Once | ORAL | Status: AC
  Administered 2022-05-27: 40 meq via ORAL
  Filled 2022-05-27: qty 2

## 2022-05-27 MED ORDER — ACETAMINOPHEN 650 MG RE SUPP: 650.0000 mg | Freq: Four times a day (QID) | RECTAL | Status: DC | PRN

## 2022-05-27 MED ORDER — POTASSIUM CHLORIDE 10 MEQ/100ML IV SOLN
10.0000 meq | Freq: Once | INTRAVENOUS | Status: AC
  Administered 2022-05-27: 10 meq via INTRAVENOUS
  Filled 2022-05-27: qty 100

## 2022-05-27 MED ORDER — HEPARIN BOLUS VIA INFUSION
2000.0000 [IU] | Freq: Once | INTRAVENOUS | Status: AC
  Administered 2022-05-27: 2000 [IU] via INTRAVENOUS
  Filled 2022-05-27: qty 2000

## 2022-05-27 MED ORDER — CYANOCOBALAMIN 1000 MCG/ML IJ SOLN
1000.0000 ug | Freq: Every day | INTRAMUSCULAR | Status: AC
  Administered 2022-05-27 – 2022-06-02 (×7): 1000 ug via INTRAMUSCULAR
  Filled 2022-05-27 (×8): qty 1

## 2022-05-27 MED ORDER — SODIUM CHLORIDE 0.9 % IV BOLUS
500.0000 mL | Freq: Once | INTRAVENOUS | Status: AC
  Administered 2022-05-27: 500 mL via INTRAVENOUS

## 2022-05-27 MED ORDER — HEPARIN (PORCINE) 25000 UT/250ML-% IV SOLN
1200.0000 [IU]/h | INTRAVENOUS | Status: AC
  Administered 2022-05-27: 1000 [IU]/h via INTRAVENOUS
  Administered 2022-05-28: 1200 [IU]/h via INTRAVENOUS
  Filled 2022-05-27 (×2): qty 250

## 2022-05-27 MED ORDER — ACETAMINOPHEN 325 MG PO TABS
650.0000 mg | ORAL_TABLET | Freq: Four times a day (QID) | ORAL | Status: DC | PRN
  Administered 2022-05-28 – 2022-06-05 (×4): 650 mg via ORAL
  Filled 2022-05-27 (×5): qty 2

## 2022-05-27 MED ORDER — AMIODARONE LOAD VIA INFUSION
150.0000 mg | Freq: Once | INTRAVENOUS | Status: AC
  Administered 2022-05-27: 150 mg via INTRAVENOUS
  Filled 2022-05-27: qty 83.34

## 2022-05-27 NOTE — ED Triage Notes (Signed)
Pt reports generalized weakness for the past week, saw her PCP on Friday and was told to monitor her Bp over the weekend, daughter reports BP this morning was 80/59 on her forearm. Pt denies chest pain or dizziness, c.o chronic cough.pt a.o

## 2022-05-27 NOTE — Telephone Encounter (Signed)
I spoke with Natasha Dickson (DPR signed) and Natasha Dickson joined her sister on the call and all are in agreement that pt should go to ED for eval and possible testing. Pts BP 80/59 and heart rate is elevated. Pt is very weak with h/a, and confusion last night. Natasha Dickson said she will get pt to [facility] ED. Declined 911 at this time but if difficulty in getting pt in car advised Natasha Dickson to call EMS and she voiced understanding. Natasha Dickson request note also sent to Dr Johney Frame in cardiology and I advised I will send this note to Dr Johney Frame but pts family will also contact card. Sending note to Dr Damita Dunnings, Damita Dunnings pool and Dr Gwyndolyn Kaufman. Please see pt message from 05/26/22 and 05/27/22.

## 2022-05-27 NOTE — ED Provider Notes (Signed)
[place] Provider Note   CSN: [id number] Arrival date & time: 05/27/22  1032     History  Chief Complaint  Patient presents with   Weakness    Natasha Dickson is a 76 y.o. female.  Patient presents to the emergency department complaining of worsening weakness and hypotension.  Patient states that for the past week or more she has been feeling increased weakness.  Patient's daughter states that is active and ongoing for approximately 2 to 3 months.  She was seen by her primary care provider on Friday and was told to monitor her blood pressure over the weekend.  Patient had a blood pressure check on her forearm this morning at home and it was noted to be 80/59.  The patient also endorses mild shortness of breath.  She denies new lower extremity swelling, chest pain, abdominal pain, nausea, vomiting, headache.  Patient's daughter states that last night the patient was intermittently confused asking about her husband.  Patient is currently conscious alert and oriented x 4.  EKG upon arrival showed atrial fibrillation with RVR.  Past medical history significant for A-fib, A-fib with RVR, combined systolic and diastolic heart failure, hyperlipidemia, nonischemic cardiomyopathy, hypokalemia, obesity, chronic kidney disease, memory change  HPI     Home Medications Prior to Admission medications   Medication Sig Start Date End Date Taking? Authorizing Provider  atorvastatin (LIPITOR) 10 MG tablet Take 1 tablet (10 mg total) by mouth daily. Held as of 05/17/22 05/17/22   Tonia Ghent, MD  cyanocobalamin (VITAMIN B12) 1000 MCG/ML injection 1000 mcg injected IM weekly x 4 doses then monthly thereafter. 05/26/22   Tonia Ghent, MD  cyclobenzaprine (FLEXERIL) 10 MG tablet Take 0.5-1 tablets (5-10 mg total) by mouth 3 (three) times daily as needed for muscle spasms (sedation caution). 05/24/22   Tonia Ghent, MD  diltiazem (CARDIZEM CD) 180 MG 24 hr  capsule Take 1 capsule (180 mg total) by mouth daily. 02/26/22   Freada Bergeron, MD  furosemide (LASIX) 20 MG tablet Take 1 tablet (20 mg total) by mouth daily. 05/24/22   Tonia Ghent, MD  losartan (COZAAR) 25 MG tablet Hold as of 05/24/22 05/24/22   Tonia Ghent, MD  metoprolol succinate (TOPROL-XL) 100 MG 24 hr tablet TAKE 1 TABLET BY MOUTH ONCE DAILY WITH MEALS OR  IMMEDIATELY  AFTER  A  MEAL 02/26/22   Freada Bergeron, MD  nitroGLYCERIN (NITROSTAT) 0.4 MG SL tablet DISSOLVE ONE TABLET UNDER THE TONGUE EVERY 5 MINUTES AS NEEDED FOR CHEST PAIN.  DO NOT EXCEED A TOTAL OF 3 DOSES IN 15 MINUTES 10/14/19   Dorothy Spark, MD  rivaroxaban (XARELTO) 20 MG TABS tablet Take 1 tablet (20 mg total) by mouth daily. 04/15/22   Freada Bergeron, MD  spironolactone (ALDACTONE) 25 MG tablet Held as of 05/17/22 Patient not taking: Reported on 05/24/2022 05/17/22   Tonia Ghent, MD  traMADol (ULTRAM) 50 MG tablet Take 1 tablet (50 mg total) by mouth every 8 (eight) hours as needed. 01/01/21   Tonia Ghent, MD      Allergies    Patient has no known allergies.    Review of Systems   Review of Systems  Constitutional:  Negative for fever.  Respiratory:  Positive for shortness of breath.   Cardiovascular:  Negative for chest pain and palpitations.  Gastrointestinal:  Negative for abdominal pain, constipation, diarrhea, nausea and vomiting.  Neurological:  Positive for  weakness. Negative for headaches.    Physical Exam Updated Vital Signs BP 108/62   Pulse 84   Temp 98.2 F (36.8 C)   Resp 16   Ht '5\' 1"'$  (1.549 m)   Wt 93 kg   SpO2 98%   BMI 38.74 kg/m  Physical Exam Vitals and nursing note reviewed.  Constitutional:      General: She is not in acute distress.    Appearance: She is well-developed. She is obese.  HENT:     Head: Normocephalic and atraumatic.     Mouth/Throat:     Mouth: Mucous membranes are moist.  Eyes:     Conjunctiva/sclera: Conjunctivae normal.      Pupils: Pupils are equal, round, and reactive to light.  Cardiovascular:     Rate and Rhythm: Regular rhythm. Tachycardia present.     Heart sounds: No murmur heard. Pulmonary:     Effort: Pulmonary effort is normal. No respiratory distress.     Breath sounds: Normal breath sounds.  Abdominal:     Palpations: Abdomen is soft.     Tenderness: There is no abdominal tenderness.  Musculoskeletal:        General: No swelling.     Cervical back: Neck supple.     Right lower leg: No edema.     Left lower leg: No edema.  Skin:    General: Skin is warm and dry.     Capillary Refill: Capillary refill takes less than 2 seconds.  Neurological:     General: No focal deficit present.     Mental Status: She is alert.  Psychiatric:        Mood and Affect: Mood normal.     ED Results / Procedures / Treatments   Labs (all labs ordered are listed, but only abnormal results are displayed) Labs Reviewed  COMPREHENSIVE METABOLIC PANEL - Abnormal; Notable for the following components:      Result Value   Potassium 3.0 (*)    Glucose, Bld 105 (*)    Creatinine, Ser 1.22 (*)    Albumin 2.9 (*)    GFR, Estimated 46 (*)    All other components within normal limits  CBC WITH DIFFERENTIAL/PLATELET - Abnormal; Notable for the following components:   Hemoglobin 9.0 (*)    HCT 31.2 (*)    MCV 79.8 (*)    MCH 23.0 (*)    MCHC 28.8 (*)    RDW 16.5 (*)    Platelets 467 (*)    All other components within normal limits  POC OCCULT BLOOD, ED - Abnormal; Notable for the following components:   Fecal Occult Bld POSITIVE (*)    All other components within normal limits  RESP PANEL BY RT-PCR (RSV, FLU A&B, COVID)  RVPGX2  BRAIN NATRIURETIC PEPTIDE  OCCULT BLOOD X 1 CARD TO LAB, STOOL  TROPONIN I (HIGH SENSITIVITY)  TROPONIN I (HIGH SENSITIVITY)    EKG EKG Interpretation  Date/Time:  Monday May 27 2022 11:11:08 EST Ventricular Rate:  142 PR Interval:    QRS Duration: 76 QT  Interval:  300 QTC Calculation: 461 R Axis:   76 Text Interpretation: Atrial fibrillation with rapid ventricular response ST & T wave abnormality, consider inferior ischemia ST & T wave abnormality, consider anterolateral ischemia Abnormal ECG When compared with ECG of 15-Feb-2013 12:24, PREVIOUS ECG IS PRESENT when compared to prior, simialar afib with RVR. No STEMI Confirmed by Antony Blackbird [phone]) on 05/27/2022 11:54:28 AM  Radiology DG Chest 1 View  Result Date: 05/27/2022 CLINICAL DATA:  Malaise symptoms for 1 week. EXAM: CHEST  1 VIEW COMPARISON:  None Available. FINDINGS: The heart size and mediastinal contours are within normal limits. Both lungs are clear. The visualized skeletal structures are unremarkable. IMPRESSION: No active disease. Electronically Signed   By: Nolon Nations M.D.   On: 05/27/2022 11:50    Procedures Procedures    Medications Ordered in ED Medications  potassium chloride 10 mEq in 100 mL IVPB (has no administration in time range)  potassium chloride SA (KLOR-CON M) CR tablet 40 mEq (has no administration in time range)  sodium chloride 0.9 % bolus 500 mL (0 mLs Intravenous Stopped 05/27/22 1259)    ED Course/ Medical Decision Making/ A&P                           Medical Decision Making Amount and/or Complexity of Data Reviewed Labs: ordered.   This patient presents to the ED for concern of weakness, this involves an extensive number of treatment options, and is a complaint that carries with it a high risk of complications and morbidity.  The differential diagnosis includes dysrhythmia, hypotension, dehydration, CVA, and others   Co morbidities that complicate the patient evaluation  History of combined chronic systolic and diastolic heart failure, atrial fibrillation on Xarelto   Additional history obtained:  Additional history obtained from patient's daughter External records from outside source obtained and reviewed including primary  care notes from December 15.  Patient evaluated for memory changes and also told to monitor blood pressure over the weekend.  Telephone encounters noted over the weekend with reported blood pressures ranging from 84/61 to 92/70.   Lab Tests:  I Ordered, and personally interpreted labs.  The pertinent results include: Initial troponin 5, potassium 3, creatinine 1.22, hemoglobin 9.0, positive fecal occult blood test, negative respiratory panel; BNP and repeat troponin pending   Imaging Studies ordered:  I ordered imaging studies including chest x-ray I independently visualized and interpreted imaging which showed no active disease I agree with the radiologist interpretation   Cardiac Monitoring: / EKG:  The patient was maintained on a cardiac monitor.  I personally viewed and interpreted the cardiac monitored which showed an underlying rhythm of: A-fib with RVR   Consultations Obtained:  I requested consultation with the cardiology PA, Lily Kocher,  and discussed lab and imaging findings as well as pertinent plan - they recommend: Plan on consulting outpatient  I requested consultation with the hospitalist.   Problem List / ED Course / Critical interventions / Medication management   I ordered medication including normal saline for hypotension and tachycardia; potassium for hypokalemia Reevaluation of the patient after these medicines showed that the patient stayed the same I have reviewed the patients home medicines and have made adjustments as needed    Test / Admission - Considered:  The patient continues to intermittently go into A-fib with RVR.  The patient's heart rate seems to increase significantly with any activity.  The patient ambulated to the restroom with a heart rate went from approximately 100 to 140 bpm.  The hypotension appears to be resolved at this time.  Patient would benefit from admission for further evaluation and management.  Cardiology to consult on  patient during admission.  Patient         Final Clinical Impression(s) / ED Diagnoses Final diagnoses:  Weakness  Atrial fibrillation with RVR (HCC)  Fecal occult blood test positive  Hypokalemia    Rx / DC Orders ED Discharge Orders     None         Ronny Bacon 05/27/22 1451    Tegeler, Gwenyth Allegra, MD 05/27/22 [phone]

## 2022-05-27 NOTE — ED Provider Triage Note (Signed)
Emergency Medicine Provider Triage Evaluation Note  Natasha Dickson , a 76 y.o. female  was evaluated in triage.  Pt complains of generalized weakness, lightheadedness, sob on exertion for several moths, worse over the weekend. BP taken at home by her daughter was running in the 90's this weekend thought they were using an arm cuff on her forearm.  Review of Systems  Positive: Malaise  Negative: Chest pain, fever, cough  Physical Exam  There were no vitals taken for this visit. Gen:   Awake, no distress   Resp:  Normal effort  MSK:   Moves extremities without difficulty  Other:    Medical Decision Making  Medically screening exam initiated at 11:11 AM.  Appropriate orders placed.  Natasha Dickson was informed that the remainder of the evaluation will be completed by another provider, this initial triage assessment does not replace that evaluation, and the importance of remaining in the ED until their evaluation is complete.     Gwenevere Abbot, [place] 05/27/22 1113

## 2022-05-27 NOTE — Telephone Encounter (Signed)
See other mychart messages; patient has been triaged

## 2022-05-27 NOTE — Telephone Encounter (Signed)
I saw the mychart messages this AM.  Agree with ER.  Thanks.

## 2022-05-27 NOTE — Consult Note (Signed)
Cardiology Consultation   Patient ID: Natasha Dickson MRN: [id number]; DOB: July 02, 1945  Admit date: 05/27/2022 Date of Consult: 05/27/2022  PCP:  Tonia Ghent, MD   [place] Providers Cardiologist:  Freada Bergeron, MD      Patient Profile:   Natasha Dickson is a 76 y.o. female with a hx of combined systolic and diastolic HF thought to be tachycardia induced CM in the setting of Aib with RVR, persistent Afib on rate control strategy, HTN, HLD and CKD who is being seen 05/27/2022 for the evaluation of Afib at the request of Dr. Florene Glen.  History of Present Illness:   [name] is a 76 year old female with history of chronic combined systolic and diastolic HF thought to be due to tachycardia induced CM with initial EF 30-35% in 2014 in the setting of Afib with RVR. Stress test at that time negative for ischemia. Most recent TTE in 04/16/22 showed LVEF 55-60%, normal RV, mild LAE, mild MR, RAP 70mHg. She was last seen in clinic by me on 04/02/22 where she was feeling low energy with more fatigue. She declined stress testing at that time but was amenable to TTE which was unrevealing as detailed above.  The patient presents to the ER today with worsening weakness and low blood pressure found to be in Afib with RVR with HR 110-130s for which Cardiology was consulted.  Blood pressure 100s/60s. Labs notable for K 3.0, Cr 1.22, BNP 203, trop 5, hemoglobin 9.0. B12 low at 166. Fecal occult blood positive. ECG with Afib with RVR. CXR unremarkable.  Currently, the patient states she has been feeling more fatigued/weak for the past several weeks. Has had ongoing dyspnea on exertion. Denies any symptoms of bleeding with no BRBPR, melena or hematuria. No orthopnea, PND or LE edema. Her spironolactone and losartan were recently stopped by PCP due to concern for soft blood pressure. Her statin was also stopped after an episode of pain across her shoulders.    Past Medical History:   Diagnosis Date   [place]   Stage I in left breast   Chronic combined systolic and diastolic CHF (congestive heart [place]) [place]    a. 02/2013 Echo: EF 30-35%, diff HK, biatrial enlargement. b. 2D echo 07/2014 showed mildly dilated LV, EF 45-50%, severely dilated left atrium and moderately dilated right atrium.   CKD (chronic kidney disease), stage II    Closed fracture of unspecified part of neck of femur 2011   Right.  Soreness when standing. (Dr. WDurward Fortes   Nonischemic cardiomyopathy ([facility]    a. 02/2013 Lexi CL: No ischemia, prob attenuation vs scar, EF 40%-->Med Rx.   [place]istent [place]    a. 02/2013: s/p TEE and attempted cardioversion x 2-->failed-->rate controlled with bb/digoxin, Xarelto initiated.    Past Surgical History:  Procedure Laterality Date   CARDIOVERSION N/A 02/18/2013   Procedure: CARDIOVERSION;  Surgeon: DJolaine Artist MD;  [facility];  Service: Cardiovascular;  Laterality: N/A;   CARDIOVERSION N/A 02/22/2013   Procedure: CARDIOVERSION;  Surgeon: PFay Records MD;  [facility];  Service: Cardiovascular;  Laterality: N/A;   MASTECTOMY  1999   Left breast with saline implant.   PARTIAL HIP ARTHROPLASTY Right    TEE WITHOUT CARDIOVERSION N/A 02/18/2013   Procedure: TRANSESOPHAGEAL ECHOCARDIOGRAM (TEE);  Surgeon: DJolaine Artist MD;  [facility];  Service: Cardiovascular;  Laterality: N/A;   tubal ligation     TUBAL LIGATION  Home Medications:  Prior to Admission medications   Medication Sig Start Date End Date Taking? Authorizing Provider  atorvastatin (LIPITOR) 10 MG tablet Take 1 tablet (10 mg total) by mouth daily. Held as of 05/17/22 05/17/22   Tonia Ghent, MD  cyanocobalamin (VITAMIN B12) 1000 MCG/ML injection 1000 mcg injected IM weekly x 4 doses then monthly thereafter. 05/26/22   Tonia Ghent, MD  cyclobenzaprine (FLEXERIL) 10 MG tablet Take 0.5-1 tablets (5-10 mg  total) by mouth 3 (three) times daily as needed for muscle spasms (sedation caution). 05/24/22   Tonia Ghent, MD  diltiazem (CARDIZEM CD) 180 MG 24 hr capsule Take 1 capsule (180 mg total) by mouth daily. 02/26/22   Freada Bergeron, MD  furosemide (LASIX) 20 MG tablet Take 1 tablet (20 mg total) by mouth daily. 05/24/22   Tonia Ghent, MD  losartan (COZAAR) 25 MG tablet Hold as of 05/24/22 05/24/22   Tonia Ghent, MD  metoprolol succinate (TOPROL-XL) 100 MG 24 hr tablet TAKE 1 TABLET BY MOUTH ONCE DAILY WITH MEALS OR  IMMEDIATELY  AFTER  A  MEAL 02/26/22   Freada Bergeron, MD  nitroGLYCERIN (NITROSTAT) 0.4 MG SL tablet DISSOLVE ONE TABLET UNDER THE TONGUE EVERY 5 MINUTES AS NEEDED FOR CHEST PAIN.  DO NOT EXCEED A TOTAL OF 3 DOSES IN 15 MINUTES 10/14/19   Dorothy Spark, MD  rivaroxaban (XARELTO) 20 MG TABS tablet Take 1 tablet (20 mg total) by mouth daily. 04/15/22   Freada Bergeron, MD  spironolactone (ALDACTONE) 25 MG tablet Held as of 05/17/22 Patient not taking: Reported on 05/24/2022 05/17/22   Tonia Ghent, MD  traMADol (ULTRAM) 50 MG tablet Take 1 tablet (50 mg total) by mouth every 8 (eight) hours as needed. 01/01/21   Tonia Ghent, MD    Inpatient Medications: Scheduled Meds:  potassium chloride  40 mEq Oral Once   Continuous Infusions:  potassium chloride     PRN Meds:   Allergies:   No Known Allergies  Social History:   Social History   Socioeconomic History   Marital status: Married    Spouse name: Not on file   Number of children: Not on file   Years of education: Not on file   Highest education level: Not on file  Occupational History   Occupation: Scientist, water quality    Employer: [employer]  Tobacco Use   Smoking status: Never   Smokeless tobacco: Never  Vaping Use   Vaping Use: Never used  Substance and Sexual Activity   Alcohol use: Yes    Alcohol/week: 0.0 standard drinks of alcohol    Comment: RARE   Drug use: No   Sexual  activity: Never  Other Topics Concern   Not on file  Social History Narrative   High School grad.   Married 40+ years   Scientist, water quality at [employer] Strain: Low Risk  (10/24/2021)   Overall Financial Resource Strain (CARDIA)    Difficulty of Paying Living Expenses: Not hard at all  Food Insecurity: No Food Insecurity (10/24/2021)   Hunger Vital Sign    Worried About Running Out of Food in the Last Year: Never true    Ran Out of Food in the Last Year: Never true  Transportation Needs: No Transportation Needs (10/24/2021)   PRAPARE - Hydrologist (Medical): No    Lack of Transportation (Non-Medical): No  Physical  Activity: Inactive (10/24/2021)   Exercise Vital Sign    Days of Exercise per Week: 0 days    Minutes of Exercise per Session: 0 min  Stress: No Stress Concern Present (10/24/2021)   [place]    Feeling of Stress : Not at all  Social Connections: Moderately Isolated (10/24/2021)   Social Connection and Isolation Panel [NHANES]    Frequency of Communication with Friends and Family: More than three times a week    Frequency of Social Gatherings with Friends and Family: More than three times a week    Attends Religious Services: Never    Marine scientist or Organizations: No    Attends Archivist Meetings: Never    Marital Status: Married  Human resources officer Violence: Not At Risk (10/24/2021)   Humiliation, Afraid, Rape, and Kick questionnaire    Fear of Current or Ex-Partner: No    Emotionally Abused: No    Physically Abused: No    Sexually Abused: No    Family History:    Family History  Problem Relation Age of Onset   Congenital heart disease Son         transposition of great vessels- corrected with  MI  as an adult   COPD Mother        from long-term smoking   Colon cancer Father    Heart disease Father     Heart attack Father    Hypertension Father    Heart disease Brother    Stroke Neg Hx      ROS:  Please see the history of present illness.  All other ROS reviewed and negative.     Physical Exam/Data:   Vitals:   05/27/22 1230 05/27/22 1245 05/27/22 1315 05/27/22 1330  BP: 106/72 (!) 115/56 94/79 108/62  Pulse:  98 100 84  Resp: (!) '24 18 14 16  '$ Temp:      SpO2:  99% 99% 98%  Weight:      Height:       No intake or output data in the 24 hours ending 05/27/22 1609    05/27/2022   11:21 AM 05/24/2022   11:26 AM 05/17/2022   12:38 PM  Last 3 Weights  Weight (lbs) 205 lb 0.4 oz 205 lb 210 lb  Weight (kg) 93 kg 92.987 kg 95.255 kg     Body mass index is 38.74 kg/m.  General:  Comfortable, NAD HEENT: normal Neck: no JVD Vascular: No carotid bruits; Distal pulses 2+ bilaterally Cardiac:  normal S1, S2; RRR; no murmur  Lungs:  clear to auscultation bilaterally, no wheezing, rhonchi or rales  Abd: soft, nontender, no hepatomegaly  Ext: no edema Musculoskeletal:  No deformities, BUE and BLE strength normal and equal Skin: warm and dry  Neuro:  CNs 2-12 intact, no focal abnormalities noted Psych:  Normal affect   EKG:  The EKG was personally reviewed and demonstrates:  Afib with HR 110 Telemetry:  Telemetry was personally reviewed and demonstrates:  Afib with HR 100-120s  Relevant CV Studies: TTE 04/16/22: IMPRESSIONS     1. Left ventricular ejection fraction, by estimation, is 55 to 60%. The  left ventricle has normal function. The left ventricle has no regional  wall motion abnormalities. Left ventricular diastolic parameters are  indeterminate.   2. Right ventricular systolic function is normal. The right ventricular  size is normal. There is normal pulmonary artery systolic pressure.   3. Left atrial size  was mildly dilated.   4. The mitral valve is normal in structure. Mild mitral valve  regurgitation. No evidence of mitral stenosis.   5. Tricuspid valve  regurgitation is mild to moderate.   6. The aortic valve is tricuspid. Aortic valve regurgitation is not  visualized. Aortic valve sclerosis is present, with no evidence of aortic  valve stenosis.   7. The inferior vena cava is normal in size with greater than 50%  respiratory variability, suggesting right atrial pressure of 3 mmHg.   Laboratory Data:  High Sensitivity Troponin:   Recent Labs  Lab 05/27/22 1143  TROPONINIHS 5     Chemistry Recent Labs  Lab 05/24/22 1155 05/27/22 1143  NA 136 136  K 3.8 3.0*  CL 98 100  CO2 28 23  GLUCOSE 107* 105*  BUN 20 15  CREATININE 1.14 1.22*  CALCIUM 8.7 9.6  GFRNONAA  --  46*  ANIONGAP  --  13    Recent Labs  Lab 05/24/22 1155 05/27/22 1143  PROT 6.4 6.5  ALBUMIN 3.6 2.9*  AST 10 16  ALT 5 10  ALKPHOS 94 84  BILITOT 0.7 0.7   Lipids  Recent Labs  Lab 05/24/22 1155  CHOL 147  TRIG 98.0  HDL 41.50  LDLCALC 86  CHOLHDL 4    Hematology Recent Labs  Lab 05/24/22 1155 05/27/22 1143  WBC 9.3 8.5  RBC 4.10 3.91  HGB 9.7* 9.0*  HCT 31.2* 31.2*  MCV 76.0* 79.8*  MCH  --  23.0*  MCHC 31.0 28.8*  RDW 16.7* 16.5*  PLT 467.0* 467*   Thyroid  Recent Labs  Lab 05/24/22 1155  TSH 3.44    BNP Recent Labs  Lab 05/24/22 1155  PROBNP 203.0*    DDimer No results for input(s): "DDIMER" in the last 168 hours.   Radiology/Studies:  DG Chest 1 View  Result Date: 05/27/2022 CLINICAL DATA:  Malaise symptoms for 1 week. EXAM: CHEST  1 VIEW COMPARISON:  None Available. FINDINGS: The heart size and mediastinal contours are within normal limits. Both lungs are clear. The visualized skeletal structures are unremarkable. IMPRESSION: No active disease. Electronically Signed   By: Nolon Nations M.D.   On: 05/27/2022 11:50   DG Chest 2 View  Result Date: 05/26/2022 CLINICAL DATA:  Cough for years. EXAM: CHEST - 2 VIEW COMPARISON:  February 16, 2013 FINDINGS: Stable cardiomegaly. The hila and mediastinum are normal. No  pneumothorax. No nodules or masses. No focal infiltrates. No other acute abnormalities. IMPRESSION: No active cardiopulmonary disease. Electronically Signed   By: Dorise Bullion III M.D.   On: 05/26/2022 13:48     Assessment and Plan:   #Persistent Afib: CHADs-vasc 5. Has known chronic Afib for which she has been maintained on rate control strategy. Presented on this admission with increasing weakness and SOB found to be in Afib with RVR with HR 110-130s. BP soft and cannot tolerate home metop/dilt at this time. Will start IV amiodarone for now and transition back to PO metop as able. She is notably anemic but unclear duration with no melena or BRBPR. Will trial heparin gtt and monitor response. Likely plan to transition to apixaban on discharge.  -Start amiodarone bolus +gtt given soft blood pressures -Start heparin gtt and monitor for bleeding -Likely plan for apixaban over xarelto if tolerates [facility]  #Microcytic Anemia: #Low B12: Unclear duration. HgB 9 on admission and was previously 13 in 2021. Fecal occult positive but no melena or BRBPR. Work-up per primary  and GI. Will trial heparin gtt to see if she tolerates given HgB has been relatively stable since 12/15 with no symptoms. -Work-up per primary and GI -Monitor Hgb with trial of heparin gtt  #Generalized weakness/fatigue: Possibly related to underlying anemia that acutely worsened in the setting of Afib with RVR. Concern she has had an overall general decline as well. Will manage Afib and anemia as above. Plan to consult PT/OT as well.  #Chronic Combined Systolic and Diastolic HF with Improved EF: EF dropped to 30-35% 2014  thought to be secondary to tachy induced CM in the setting of Afib with RVR. Improved back to 55-60% in 04/2022. Currently euvolemic on exam. Holding GDMT due to soft blood pressures. -Hold home metop, spiro and losartan due to soft blood pressures -Monitor I/Is and daily weights -Low Na diet  #HTN: Now with low  blood pressure.  -Holding home meds in the setting of soft blood pressures  #CKD Stage II: Monitor.   #HLD: -Continue lipitor '10mg'$  daily   Risk Assessment/Risk Scores:        New York Heart Association (NYHA) Functional Class NYHA Class III  CHA2DS2-VASc Score = 5   This indicates a 7.2% annual risk of stroke. The patient's score is based upon: CHF History: 1 HTN History: 1 Diabetes History: 0 Stroke History: 0 Vascular Disease History: 0 Age Score: 2 Gender Score: 1         For questions or updates, please contact [place] Please consult [email or web address] for contact info under    Signed, Freada Bergeron, MD  05/27/2022 4:09 PM

## 2022-05-27 NOTE — Progress Notes (Signed)
Rounding Note    Patient Name: Natasha Dickson Date of Encounter: 05/27/2022  [place] Cardiologist: Freada Bergeron, MD ***  Subjective   ***  Inpatient Medications    Scheduled Meds:  amiodarone  150 mg Intravenous Once   cyanocobalamin  1,000 mcg Intramuscular Daily   heparin  2,000 Units Intravenous Once   Continuous Infusions:  amiodarone     Followed by   amiodarone     heparin     potassium chloride 10 mEq (05/27/22 1747)   PRN Meds:    Vital Signs    Vitals:   05/27/22 1315 05/27/22 1330 05/27/22 1611 05/27/22 1736  BP: 94/79 108/62    Pulse: 100 84    Resp: 14 16    Temp:   98 F (36.7 C)   SpO2: 99% 98%  99%  Weight:      Height:       No intake or output data in the 24 hours ending 05/27/22 1817    05/27/2022   11:21 AM 05/24/2022   11:26 AM 05/17/2022   12:38 PM  Last 3 Weights  Weight (lbs) 205 lb 0.4 oz 205 lb 210 lb  Weight (kg) 93 kg 92.987 kg 95.255 kg      Telemetry    *** - Personally Reviewed  ECG    *** - Personally Reviewed  Physical Exam  *** GEN: No acute distress.   Neck: No JVD Cardiac: RRR, no murmurs, rubs, or gallops.  Respiratory: Clear to auscultation bilaterally. GI: Soft, nontender, non-distended  MS: No edema; No deformity. Neuro:  Nonfocal  Psych: Normal affect   Labs    High Sensitivity Troponin:   Recent Labs  Lab 05/27/22 1143  TROPONINIHS 5     Chemistry Recent Labs  Lab 05/24/22 1155 05/27/22 1143  NA 136 136  K 3.8 3.0*  CL 98 100  CO2 28 23  GLUCOSE 107* 105*  BUN 20 15  CREATININE 1.14 1.22*  CALCIUM 8.7 9.6  PROT 6.4 6.5  ALBUMIN 3.6 2.9*  AST 10 16  ALT 5 10  ALKPHOS 94 84  BILITOT 0.7 0.7  GFRNONAA  --  46*  ANIONGAP  --  13    Lipids  Recent Labs  Lab 05/24/22 1155  CHOL 147  TRIG 98.0  HDL 41.50  LDLCALC 86  CHOLHDL 4    Hematology Recent Labs  Lab 05/24/22 1155 05/27/22 1143  WBC 9.3 8.5  RBC 4.10 3.91  HGB 9.7* 9.0*  HCT 31.2*  31.2*  MCV 76.0* 79.8*  MCH  --  23.0*  MCHC 31.0 28.8*  RDW 16.7* 16.5*  PLT 467.0* 467*   Thyroid  Recent Labs  Lab 05/24/22 1155  TSH 3.44    BNP Recent Labs  Lab 05/24/22 1155  PROBNP 203.0*    DDimer No results for input(s): "DDIMER" in the last 168 hours.   Radiology    DG Chest 1 View  Result Date: 05/27/2022 CLINICAL DATA:  Malaise symptoms for 1 week. EXAM: CHEST  1 VIEW COMPARISON:  None Available. FINDINGS: The heart size and mediastinal contours are within normal limits. Both lungs are clear. The visualized skeletal structures are unremarkable. IMPRESSION: No active disease. Electronically Signed   By: Nolon Nations M.D.   On: 05/27/2022 11:50    Cardiac Studies   TTE 04/16/22: IMPRESSIONS     1. Left ventricular ejection fraction, by estimation, is 55 to 60%. The  left ventricle has normal  function. The left ventricle has no regional  wall motion abnormalities. Left ventricular diastolic parameters are  indeterminate.   2. Right ventricular systolic function is normal. The right ventricular  size is normal. There is normal pulmonary artery systolic pressure.   3. Left atrial size was mildly dilated.   4. The mitral valve is normal in structure. Mild mitral valve  regurgitation. No evidence of mitral stenosis.   5. Tricuspid valve regurgitation is mild to moderate.   6. The aortic valve is tricuspid. Aortic valve regurgitation is not  visualized. Aortic valve sclerosis is present, with no evidence of aortic  valve stenosis.   7. The inferior vena cava is normal in size with greater than 50%  respiratory variability, suggesting right atrial pressure of 3 mmHg.   Patient Profile     76 y.o. female  with a hx of combined systolic and diastolic HF thought to be tachycardia induced CM in the setting of Aib with RVR, persistent Afib on rate control strategy, HTN, HLD and CKD who is being seen 05/27/2022 for the evaluation of Afib at the request of Dr.  Florene Glen.   Assessment & Plan    #Persistent Afib: CHADs-vasc 5. Has known chronic Afib for which she has been maintained on rate control strategy. Presented on this admission with increasing weakness and SOB found to be in Afib with RVR with HR 110-130s. BP soft and cannot tolerate home metop/dilt at this time. Will start IV amiodarone for now and transition back to PO metop as able. She is notably anemic but unclear duration with no melena or BRBPR. Will trial heparin gtt and monitor response. Likely plan to transition to apixaban on discharge.  -Start amiodarone bolus +gtt given soft blood pressures -Start heparin gtt and monitor for bleeding -Likely plan for apixaban over xarelto if tolerates [facility]   #Microcytic Anemia: #Low B12: Unclear duration. HgB 9 on admission and was previously 13 in 2021. Fecal occult positive but no melena or BRBPR. Work-up per primary and GI. Will trial heparin gtt to see if she tolerates given HgB has been relatively stable since 12/15 with no symptoms. -Work-up per primary and GI -Monitor Hgb with trial of heparin gtt   #Generalized weakness/fatigue: Possibly related to underlying anemia that acutely worsened in the setting of Afib with RVR. Concern she has had an overall general decline as well. Will manage Afib and anemia as above. Plan to consult PT/OT as well.   #Chronic Combined Systolic and Diastolic HF with Improved EF: EF dropped to 30-35% 2014  thought to be secondary to tachy induced CM in the setting of Afib with RVR. Improved back to 55-60% in 04/2022. Currently euvolemic on exam. Holding GDMT due to soft blood pressures. -Hold home metop, spiro and losartan due to soft blood pressures -Monitor I/Is and daily weights -Low Na diet   #HTN: Now with low blood pressure.  -Holding home meds in the setting of soft blood pressures   #CKD Stage II: Monitor.   #HLD: -Continue lipitor '10mg'$  daily {Are we signing off today?:[id number]}  For questions or  updates, please contact [place] Please consult [email or web address] for contact info under        Signed, Freada Bergeron, MD  05/27/2022, 6:17 PM

## 2022-05-27 NOTE — Progress Notes (Signed)
ANTICOAGULATION CONSULT NOTE - Initial Consult  Pharmacy Consult for heparin  Indication: atrial fibrillation  No Known Allergies  Patient Measurements: Height: '5\' 1"'$  (154.9 cm) Weight: 93 kg (205 lb 0.4 oz) IBW/kg (Calculated) : 47.8 Heparin Dosing Weight: 69.7kg   Vital Signs: Temp: 98 F (36.7 C) (12/18 1611) BP: 108/62 (12/18 1330) Pulse Rate: 84 (12/18 1330)  Labs: Recent Labs    05/27/22 1143  HGB 9.0*  HCT 31.2*  PLT 467*  CREATININE 1.22*  TROPONINIHS 5    Estimated Creatinine Clearance: 40.8 mL/min (A) (by C-G formula based on SCr of 1.22 mg/dL (H)).   Medical History: Past Medical History:  Diagnosis Date   Breast cancer ([place]) 1999   Stage I in left breast   Chronic combined systolic and diastolic CHF (congestive heart failure) ([place])    a. 02/2013 Echo: EF 30-35%, diff HK, biatrial enlargement. b. 2D echo 07/2014 showed mildly dilated LV, EF 45-50%, severely dilated left atrium and moderately dilated right atrium.   CKD (chronic kidney disease), stage II    Closed fracture of unspecified part of neck of femur 2011   Right.  Soreness when standing. (Dr. Durward Fortes)   Nonischemic cardiomyopathy [facility])    a. 02/2013 Lexi CL: No ischemia, prob attenuation vs scar, EF 40%-->Med Rx.   Persistent atrial fibrillation ([place])    a. 02/2013: s/p TEE and attempted cardioversion x 2-->failed-->rate controlled with bb/digoxin, Xarelto initiated.    Assessment: Patient admitted with afib. Not on anticoagulation PTA. CHADs-vasc 5. Plan to transition to Eliquis per cardiology notes.   CBC stable but does have downward trend. Pharmacy consulted to dose heparin.    Goal of Therapy:  Heparin level 0.3-0.7 units/ml Monitor platelets by anticoagulation protocol: Yes   Plan:  Give 2000 units bolus x 1 Start heparin infusion at 1000 units/hr Check anti-Xa level in 8 hours and daily while on heparin Continue to monitor H&H and platelets  Esmeralda Arthur, PharmD, BCCCP   05/27/2022,5:56 PM

## 2022-05-27 NOTE — ED Notes (Signed)
Pt family assisting pt to rest room. Pt c/o lightheadedness. Pt assisted to sit into regular chair and assisted onto ER stretcher. PT hr 130's upon arriving back into the room. Pt placed back on continuous monitoring. Side rails up. Pt dizziness improved with rest.

## 2022-05-27 NOTE — H&P (Signed)
History and Physical    [name] WFU:[id number] DOB: 02-03-1946 DOA: 05/27/2022  PCP: Tonia Ghent, MD  Patient coming from: home  I have personally briefly reviewed patient's old medical records in [place]  Chief Complaint: weakness  HPI: Natasha Dickson is [name] 76 y.o. female with medical history significant of atrial fibrillation on xarelto, systolic HF (recovered EF), non ischemic cardiomyopathy, hypertension and multiple other medical issues here with progressive generalized weakness.  Story varies [name] bit when talking to patient vs her daughter.  Patient notes worsening weakness over the past few weeks.  She denies fevers, chills, night sweats.  Notes some decreased appetite as well as weight loss.  Yesterday, she felt lightheaded and weak and called for help.  She didn't fall, but her family had to help her down the the couch.  The daughter notes [name] longer time frame for her progressive weakness.  She notes weakness over the past few months.  If the patient is up more than 5-10 min, she can barely get back to the sofa to sit down.  She lays on the couch and gets up to go to the restroom, but doesn't do much else.  She fell last year, but hasn't fallen since until the recent near fall yesterday.  Daughter thinks things got worse around thanksgiving when the patient began complaining of pain between her shoulder blades.  It lasted around 20 min, comes and goes.  Her daughter also notes intermittent confusion which is mild over the past year or 2.  They followed up with the PCP over the last few days to work up these issues.  She was directed to the ED in the setting of the development of low blood pressures.     Review of Systems: As per HPI otherwise all other systems reviewed and are negative.  Past Medical History:  Diagnosis Date   Breast cancer ([place]) 1999   Stage I in left breast   Chronic combined systolic and diastolic CHF (congestive heart failure) ([place])    [name]. 02/2013  Echo: EF 30-35%, diff HK, biatrial enlargement. b. 2D echo 07/2014 showed mildly dilated LV, EF 45-50%, severely dilated left atrium and moderately dilated right atrium.   CKD (chronic kidney disease), stage II    Closed fracture of unspecified part of neck of femur 2011   Right.  Soreness when standing. (Dr. Durward Fortes)   Nonischemic cardiomyopathy [facility])    Cathren Sween. 02/2013 Lex[facility] CL: No ischemia, prob attenuation vs scar, EF 40%-->Med Rx.   Persistent atrial fibrillation ([place])    [name]. 02/2013: s/p TEE and attempted cardioversion x 2-->failed-->rate controlled with bb/digoxin, Xarelto initiated.    Past Surgical History:  Procedure Laterality Date   CARDIOVERSION N/Joeleen Wortley 02/18/2013   Procedure: CARDIOVERSION;  Surgeon: Jolaine Artist, MD;  Location: [facility] ENDOSCOPY;  Service: Cardiovascular;  Laterality: N/Aspasia Rude;   CARDIOVERSION N/Keiland Pickering 02/22/2013   Procedure: CARDIOVERSION;  Surgeon: Fay Records, MD;  Location: [facility] ENDOSCOPY;  Service: Cardiovascular;  Laterality: N/Tiffanyann Deroo;   MASTECTOMY  1999   Left breast with saline implant.   PARTIAL HIP ARTHROPLASTY Right    TEE WITHOUT CARDIOVERSION N/[name] 02/18/2013   Procedure: TRANSESOPHAGEAL ECHOCARDIOGRAM (TEE);  Surgeon: Jolaine Artist, MD;  Location: [facility] ENDOSCOPY;  Service: Cardiovascular;  Laterality: N/Manali Mcelmurry;   tubal ligation     TUBAL LIGATION      Social History  reports that she has never smoked. She has never used smokeless tobacco. She reports current alcohol use. She reports  that she does not use drugs.  No Known Allergies  Family History  Problem Relation Age of Onset   Congenital heart disease Son         transposition of great vessels- corrected with  MI  as an adult   COPD Mother        from long-term smoking   Colon cancer Father    Heart disease Father    Heart attack Father    Hypertension Father    Heart disease Brother    Stroke Neg Hx     Prior to Admission medications   Medication Sig Start Date End Date Taking? Authorizing Provider   atorvastatin (LIPITOR) 10 MG tablet Take 1 tablet (10 mg total) by mouth daily. Held as of 05/17/22 05/17/22   Tonia Ghent, MD  cyanocobalamin (VITAMIN B12) 1000 MCG/ML injection 1000 mcg injected IM weekly x 4 doses then monthly thereafter. 05/26/22   Tonia Ghent, MD  cyclobenzaprine (FLEXERIL) 10 MG tablet Take 0.5-1 tablets (5-10 mg total) by mouth 3 (three) times daily as needed for muscle spasms (sedation caution). 05/24/22   Tonia Ghent, MD  diltiazem (CARDIZEM CD) 180 MG 24 hr capsule Take 1 capsule (180 mg total) by mouth daily. 02/26/22   Freada Bergeron, MD  furosemide (LASIX) 20 MG tablet Take 1 tablet (20 mg total) by mouth daily. 05/24/22   Tonia Ghent, MD  losartan (COZAAR) 25 MG tablet Hold as of 05/24/22 05/24/22   Tonia Ghent, MD  metoprolol succinate (TOPROL-XL) 100 MG 24 hr tablet TAKE 1 TABLET BY MOUTH ONCE DAILY WITH MEALS OR  IMMEDIATELY  AFTER  Arnitra Sokoloski  MEAL 02/26/22   Freada Bergeron, MD  nitroGLYCERIN (NITROSTAT) 0.4 MG SL tablet DISSOLVE ONE TABLET UNDER THE TONGUE EVERY 5 MINUTES AS NEEDED FOR CHEST PAIN.  DO NOT EXCEED Mckenley Birenbaum TOTAL OF 3 DOSES IN 15 MINUTES 10/14/19   Dorothy Spark, MD  rivaroxaban (XARELTO) 20 MG TABS tablet Take 1 tablet (20 mg total) by mouth daily. 04/15/22   Freada Bergeron, MD  spironolactone (ALDACTONE) 25 MG tablet Held as of 05/17/22 Patient not taking: Reported on 05/24/2022 05/17/22   Tonia Ghent, MD  traMADol (ULTRAM) 50 MG tablet Take 1 tablet (50 mg total) by mouth every 8 (eight) hours as needed. 01/01/21   Tonia Ghent, MD    Physical Exam: Vitals:   05/27/22 1736 05/27/22 1800 05/27/22 1828 05/27/22 2016  BP:   102/60   Pulse:  (!) 119 (!) 131   Resp:  18 20   Temp:    98 F (36.7 C)  TempSrc:    Oral  SpO2: 99% 100% 100%   Weight:      Height:        Constitutional: NAD, calm, comfortable Vitals:   05/27/22 1736 05/27/22 1800 05/27/22 1828 05/27/22 2016  BP:   102/60   Pulse:  (!) 119  (!) 131   Resp:  18 20   Temp:    98 F (36.7 C)  TempSrc:    Oral  SpO2: 99% 100% 100%   Weight:      Height:       Eyes: PERRL, lids and conjunctivae normal ENMT: Mucous membranes are moist. Neck: normal, supple Respiratory: unlabored Cardiovascular: irregularly irregular, tachycardic  Abdomen: s/nt/nd Musculoskeletal: no clubbing / cyanosis. No joint deformity upper and lower extremities. Good ROM, no contractures. Normal muscle tone.  Skin: no rashes, lesions, ulcers. No induration  Neurologic: CN 2-12 grossly intact. Moving all extremities with equal strength.  Psychiatric: Normal judgment and insight. Alert and oriented x 3. Normal mood.   Labs on Admission: I have personally reviewed following labs and imaging studies  CBC: Recent Labs  Lab 05/24/22 1155 05/27/22 1143  WBC 9.3 8.5  NEUTROABS 6.9 5.9  HGB 9.7* 9.0*  HCT 31.2* 31.2*  MCV 76.0* 79.8*  PLT 467.0* 467*    Basic Metabolic Panel: Recent Labs  Lab 05/24/22 1155 05/27/22 1143  NA 136 136  K 3.8 3.0*  CL 98 100  CO2 28 23  GLUCOSE 107* 105*  BUN 20 15  CREATININE 1.14 1.22*  CALCIUM 8.7 9.6    GFR: Estimated Creatinine Clearance: 40.8 mL/min (Olivier Frayre) (by C-G formula based on SCr of 1.22 mg/dL (H)).  Liver Function Tests: Recent Labs  Lab 05/24/22 1155 05/27/22 1143  AST 10 16  ALT 5 10  ALKPHOS 94 84  BILITOT 0.7 0.7  PROT 6.4 6.5  ALBUMIN 3.6 2.9*    Urine analysis:    Component Value Date/Time   COLORURINE YELLOW 02/12/2010 1904   APPEARANCEUR CLOUDY (Reesha Debes) 02/12/2010 1904   LABSPEC 1.021 02/12/2010 1904   PHURINE 8.5 (H) 02/12/2010 1904   GLUCOSEU NEGATIVE 02/12/2010 1904   HGBUR NEGATIVE 02/12/2010 1904   BILIRUBINUR NEGATIVE 02/12/2010 1904   KETONESUR TRACE (Peggy Monk) 02/12/2010 1904   PROTEINUR 30 (Lener Ventresca) 02/12/2010 1904   UROBILINOGEN 1.0 02/12/2010 1904   NITRITE NEGATIVE 02/12/2010 1904   LEUKOCYTESUR NEGATIVE 02/12/2010 1904    Radiological Exams on Admission: DG Chest 1  View  Result Date: 05/27/2022 CLINICAL DATA:  Malaise symptoms for 1 week. EXAM: CHEST  1 VIEW COMPARISON:  None Available. FINDINGS: The heart size and mediastinal contours are within normal limits. Both lungs are clear. The visualized skeletal structures are unremarkable. IMPRESSION: No active disease. Electronically Signed   By: Nolon Nations M.D.   On: 05/27/2022 11:50    EKG: Independently reviewed. Atrial fibrillation  Assessment/Plan Principal Problem:   Atrial fibrillation with RVR (HCC) Active Problems:   B12 deficiency   Weakness   (HFpEF) heart failure with preserved ejection fraction (HCC)   Assessment and Plan: Generalized Weakness  Subacute to chronic Ddx is broad, but includes most likely related to anemia, afib with RVR, b12 deficiency.   Will treat RVR, appreciate cardiology assistance Follow anemia, transfuse for downtrending or symptomatic anemia Treat b12 deficiency. TSH was wnl on 12/15. Follow inflammatory markers given c/o shoulder/back pain (consider PMR?) With chronic cough, weight loss, poor appetite, will follow CT chest Will need PT/OT evals, will need additional workup and follow up of weakness outpatient   Atrial Fibrillation with RVR Amiodarone per cards Xarelto on hold for now Heparin gtt for now while trending anemia Appreciate cardiology assistance This could be contributing to above  Microcytic Anemia  Vitamin B12 Deficiency Unclear recent baseline, last normal Hb was 2021, but none since then On 12/15 was 9.7.  About the same today, 9. Has been taking xarelto consistently.  Had positive FOBT, but no bloody or black stool. Given need for anticoagulation with afib, positive FOBT, will ask GI to comment on potential inpatient scope Will monitor on heparin gtt for now and see if she tolerates this B12 deficiency -> replace Suspect she has iron def, follow iron panel Follow folate level   Cognitive Deficit Intermittent confusion and  memory issues over past year or so Does have B12 def, will treat this Needs additional follow up  with neurology outpatient to work up dementia No imaging needed at this time  Back Pain Following inflammatory markers No complaints at this time, will follow  Hypokalemia Replace and follow  Follow mag  History of Systolic Heart Failure Recent echo with recovered EF Mildly elevated BNP, not grossly overloaded on exam Hold lasix for now given concern for weakness, etc  Hypertension Follow orthostatics as BP on low side outpatient Hold spironolactone, metoprolol, losartan, diltiazem  HLD Statin on hold due to concern for muscle aches Follow CK  Obesity Body mass index is 38.74 kg/m.    DVT prophylaxis: Heparin gtt  Code Status:   Full, discussed with patient and daughter  Family Communication:  daughter  Disposition Plan:   Patient is from:  home  Anticipated DC to:  home  Anticipated DC date:  Pending improvement  Anticipated DC barriers: Further imrpovemetn  Consults called:  GI, cards  Admission status:  obs   Severity of Illness: The appropriate patient status for this patient is OBSERVATION. Observation status is judged to be reasonable and necessary in order to provide the required intensity of service to ensure the patient's safety. The patient's presenting symptoms, physical exam findings, and initial radiographic and laboratory data in the context of their medical condition is felt to place them at decreased risk for further clinical deterioration. Furthermore, it is anticipated that the patient will be medically stable for discharge from the hospital within 2 midnights of admission.     Fayrene Helper MD [facility]  How to contact the [facility] Attending or Consulting provider [place] or covering provider during after hours [place], for this patient?   Check the care team in [facility] and look for Neveyah Garzon) attending/consulting TR[facility] provider listed and b) the [facility] team  listed Log into [email or web address] and use [place]'s universal password to access. If you do not have the password, please contact the hospital operator. Locate the [facility] provider you are looking for under [facility] and page to [place] number that you can be directly reached. If you still have difficulty reaching the provider, please page the [facility] (Director on Call) for the Hospitalists listed on amion for assistance.  05/27/2022, 9:33 PM

## 2022-05-28 DIAGNOSIS — R531 Weakness: Secondary | ICD-10-CM | POA: Diagnosis not present

## 2022-05-28 DIAGNOSIS — D649 Anemia, unspecified: Secondary | ICD-10-CM

## 2022-05-28 DIAGNOSIS — R195 Other fecal abnormalities: Secondary | ICD-10-CM

## 2022-05-28 DIAGNOSIS — E538 Deficiency of other specified B group vitamins: Secondary | ICD-10-CM | POA: Diagnosis not present

## 2022-05-28 DIAGNOSIS — I4891 Unspecified atrial fibrillation: Secondary | ICD-10-CM

## 2022-05-28 DIAGNOSIS — D509 Iron deficiency anemia, unspecified: Secondary | ICD-10-CM | POA: Diagnosis not present

## 2022-05-28 DIAGNOSIS — I5032 Chronic diastolic (congestive) heart failure: Secondary | ICD-10-CM | POA: Diagnosis not present

## 2022-05-28 LAB — CBC
HCT: 27.2 % — ABNORMAL LOW (ref 36.0–46.0)
Hemoglobin: 8 g/dL — ABNORMAL LOW (ref 12.0–15.0)
MCH: 23.3 pg — ABNORMAL LOW (ref 26.0–34.0)
MCHC: 29.4 g/dL — ABNORMAL LOW (ref 30.0–36.0)
MCV: 79.1 fL — ABNORMAL LOW (ref 80.0–100.0)
Platelets: 403 10*3/uL — ABNORMAL HIGH (ref 150–400)
RBC: 3.44 MIL/uL — ABNORMAL LOW (ref 3.87–5.11)
RDW: 16.7 % — ABNORMAL HIGH (ref 11.5–15.5)
WBC: 7.6 10*3/uL (ref 4.0–10.5)
nRBC: 0 % (ref 0.0–0.2)

## 2022-05-28 LAB — COMPREHENSIVE METABOLIC PANEL
ALT: 7 U/L (ref 0–44)
AST: 17 U/L (ref 15–41)
Albumin: 2.6 g/dL — ABNORMAL LOW (ref 3.5–5.0)
Alkaline Phosphatase: 76 U/L (ref 38–126)
Anion gap: 10 (ref 5–15)
BUN: 13 mg/dL (ref 8–23)
CO2: 21 mmol/L — ABNORMAL LOW (ref 22–32)
Calcium: 8.7 mg/dL — ABNORMAL LOW (ref 8.9–10.3)
Chloride: 105 mmol/L (ref 98–111)
Creatinine, Ser: 1.13 mg/dL — ABNORMAL HIGH (ref 0.44–1.00)
GFR, Estimated: 50 mL/min — ABNORMAL LOW (ref >60)
Glucose, Bld: 98 mg/dL (ref 70–99)
Potassium: 4.4 mmol/L (ref 3.5–5.1)
Sodium: 136 mmol/L (ref 135–145)
Total Bilirubin: 0.6 mg/dL (ref 0.3–1.2)
Total Protein: 5.6 g/dL — ABNORMAL LOW (ref 6.5–8.1)

## 2022-05-28 LAB — HEMOGLOBIN AND HEMATOCRIT, BLOOD
HCT: 30.9 % — ABNORMAL LOW (ref 36.0–46.0)
HCT: 30.6 % — ABNORMAL LOW (ref 36.0–46.0)
Hemoglobin: 9 g/dL — ABNORMAL LOW (ref 12.0–15.0)
Hemoglobin: 9.3 g/dL — ABNORMAL LOW (ref 12.0–15.0)

## 2022-05-28 LAB — IRON AND TIBC
Iron: 15 ug/dL — ABNORMAL LOW (ref 28–170)
Saturation Ratios: 3 % — ABNORMAL LOW (ref 10.4–31.8)
TIBC: 535 ug/dL — ABNORMAL HIGH (ref 250–450)
UIBC: 520 ug/dL

## 2022-05-28 LAB — MAGNESIUM: Magnesium: 2 mg/dL (ref 1.7–2.4)

## 2022-05-28 LAB — FERRITIN: Ferritin: 7 ng/mL — ABNORMAL LOW (ref 11–307)

## 2022-05-28 LAB — CK: Total CK: 34 U/L — ABNORMAL LOW (ref 38–234)

## 2022-05-28 LAB — HEPARIN LEVEL (UNFRACTIONATED)
Heparin Unfractionated: 0.45 IU/mL (ref 0.30–0.70)
Heparin Unfractionated: 0.37 IU/mL (ref 0.30–0.70)
Heparin Unfractionated: 0.45 IU/mL (ref 0.30–0.70)

## 2022-05-28 LAB — SEDIMENTATION RATE: Sed Rate: 57 mm/hr — ABNORMAL HIGH (ref 0–22)

## 2022-05-28 LAB — C-REACTIVE PROTEIN: CRP: <0.5 mg/dL (ref <1.0)

## 2022-05-28 LAB — FOLATE: Folate: 7.3 ng/mL (ref >5.9)

## 2022-05-28 LAB — APTT: aPTT: 54 seconds — ABNORMAL HIGH (ref 24–36)

## 2022-05-28 MED ORDER — AMIODARONE IV BOLUS ONLY 150 MG/100ML
150.0000 mg | Freq: Once | INTRAVENOUS | Status: AC
  Administered 2022-05-28: 150 mg via INTRAVENOUS
  Filled 2022-05-28: qty 100

## 2022-05-28 MED ORDER — PEG-KCL-NACL-NASULF-NA ASC-C 100 G PO SOLR
0.5000 | Freq: Once | ORAL | Status: AC
  Administered 2022-05-28: 100 g via ORAL
  Filled 2022-05-28: qty 1

## 2022-05-28 MED ORDER — FOLIC ACID 1 MG PO TABS
1.0000 mg | ORAL_TABLET | Freq: Every day | ORAL | Status: DC
  Administered 2022-05-28 – 2022-06-12 (×16): 1 mg via ORAL
  Filled 2022-05-28 (×16): qty 1

## 2022-05-28 MED ORDER — METOCLOPRAMIDE HCL 5 MG/ML IJ SOLN
10.0000 mg | Freq: Four times a day (QID) | INTRAMUSCULAR | Status: AC
  Administered 2022-05-28: 10 mg via INTRAVENOUS
  Filled 2022-05-28: qty 2

## 2022-05-28 MED ORDER — PEG-KCL-NACL-NASULF-NA ASC-C 100 G PO SOLR: 1.0000 | Freq: Once | ORAL | Status: DC

## 2022-05-28 MED ORDER — SODIUM CHLORIDE 0.9 % IV SOLN
250.0000 mg | Freq: Every day | INTRAVENOUS | Status: DC
  Administered 2022-05-28: 250 mg via INTRAVENOUS
  Filled 2022-05-28 (×3): qty 20

## 2022-05-28 MED ORDER — FERROUS SULFATE 325 (65 FE) MG PO TABS
325.0000 mg | ORAL_TABLET | ORAL | Status: DC
  Administered 2022-05-28 – 2022-06-01 (×3): 325 mg via ORAL
  Filled 2022-05-28 (×4): qty 1

## 2022-05-28 MED ORDER — METOPROLOL TARTRATE 12.5 MG HALF TABLET
12.5000 mg | ORAL_TABLET | Freq: Two times a day (BID) | ORAL | Status: DC
  Administered 2022-05-28 (×2): 12.5 mg via ORAL
  Filled 2022-05-28 (×2): qty 1

## 2022-05-28 MED ORDER — BISACODYL 5 MG PO TBEC
20.0000 mg | DELAYED_RELEASE_TABLET | Freq: Once | ORAL | Status: AC
  Administered 2022-05-28: 20 mg via ORAL
  Filled 2022-05-28: qty 4

## 2022-05-28 NOTE — Progress Notes (Signed)
ANTICOAGULATION CONSULT NOTE  Pharmacy Consult for heparin  Indication: atrial fibrillation  No Known Allergies  Patient Measurements: Height: '5\' 1"'$  (154.9 cm) Weight: 93 kg (205 lb 0.4 oz) IBW/kg (Calculated) : 47.8 Heparin Dosing Weight: 69.7kg   Vital Signs: Temp: 97.8 F (36.6 C) (12/19 1242) Temp Source: Oral (12/19 1242) BP: 128/76 (12/19 1242) Pulse Rate: 116 (12/19 1242)  Labs: Recent Labs    05/27/22 1143 05/27/22 1809 05/28/22 0120 05/28/22 0425 05/28/22 0918 05/28/22 1225  HGB 9.0*  --  8.0*  --  9.0*  --   HCT 31.2*  --  27.2*  --  30.9*  --   PLT 467*  --  403*  --   --   --   APTT  --   --   --   --   --  54*  HEPARINUNFRC  --   --  0.45 0.37  --  0.45  CREATININE 1.22*  --  1.13*  --   --   --   CKTOTAL  --   --  34*  --   --   --   TROPONINIHS 5 5  --   --   --   --      Estimated Creatinine Clearance: 44.1 mL/min (A) (by C-G formula based on SCr of 1.13 mg/dL (H)).   Medical History: Past Medical History:  Diagnosis Date   Breast cancer ([place]) 1999   Stage I in left breast   Chronic combined systolic and diastolic CHF (congestive heart failure) ([place])    a. 02/2013 Echo: EF 30-35%, diff HK, biatrial enlargement. b. 2D echo 07/2014 showed mildly dilated LV, EF 45-50%, severely dilated left atrium and moderately dilated right atrium.   CKD (chronic kidney disease), stage II    Closed fracture of unspecified part of neck of femur 2011   Right.  Soreness when standing. (Dr. Durward Fortes)   Nonischemic cardiomyopathy [facility])    a. 02/2013 Lexi CL: No ischemia, prob attenuation vs scar, EF 40%-->Med Rx.   Persistent atrial fibrillation ([place])    a. 02/2013: s/p TEE and attempted cardioversion x 2-->failed-->rate controlled with bb/digoxin, Xarelto initiated.    Assessment: 19 yoF admitted with AF RVR. Pt on Xarelto PTA for anticoagulation, on hold for IV heparin.  Heparin level is therapeutic but likely falsely elevated by DOAC use, aPTT is slightly below  goal at 54 seconds.   Goal of Therapy:  Heparin level 0.3-0.7 units/ml aPTT 66-102 seconds Monitor platelets by anticoagulation protocol: Yes   Plan:  Increase heparin to 1200 units/h Recheck heparin level and aPTT with morning labs  Arrie Senate, PharmD, BCPS, [facility] Clinical Pharmacist [phone] Please check AMION for all [place] numbers 05/28/2022

## 2022-05-28 NOTE — ED Notes (Signed)
ED TO INPATIENT HANDOFF REPORT  ED Nurse Name and Phone #: Anderson Malta [phone]  S Name/Age/Gender Natasha Dickson 76 y.o. female Room/Bed: 009C/009C  Code Status   Code Status: Full Code  Home/SNF/Other Home Patient oriented to: self, place, time, and situation Is this baseline? Yes   Triage Complete: Triage complete  Chief Complaint Atrial fibrillation with RVR ([place]) [I48.91] Weakness [R53.1]  Triage Note Pt reports generalized weakness for the past week, saw her PCP on Friday and was told to monitor her Bp over the weekend, daughter reports BP this morning was 80/59 on her forearm. Pt denies chest pain or dizziness, c.o chronic cough.pt a.o   Allergies No Known Allergies  Level of Care/Admitting Diagnosis ED Disposition     ED Disposition  Admit   Condition  --   Comment  Hospital Area: [place] [[id number]]  Level of Care: Progressive [102]  Admit to Progressive based on following criteria: CARDIOVASCULAR & THORACIC of moderate stability with acute coronary syndrome symptoms/low risk myocardial infarction/hypertensive urgency/arrhythmias/heart failure potentially compromising stability and stable post cardiovascular intervention patients.  May admit patient to [name] or [facility] if equivalent level of care is available:: No  Covid Evaluation: Asymptomatic - no recent exposure (last 10 days) testing not required  Diagnosis: Weakness [132440]  Admitting Physician: Elodia Florence [phone]  Attending Physician: Cephus Slater, Lamonte Sakai [[id number]]  Certification:: I certify this patient will need inpatient services for at least 2 midnights  Estimated Length of Stay: 2          B Medical/Surgery History Past Medical History:  Diagnosis Date   Breast cancer ([place]) 1999   Stage I in left breast   Chronic combined systolic and diastolic CHF (congestive heart failure) ([place])    a. 02/2013 Echo: EF 30-35%, diff HK, biatrial enlargement. b. 2D  echo 07/2014 showed mildly dilated LV, EF 45-50%, severely dilated left atrium and moderately dilated right atrium.   CKD (chronic kidney disease), stage II    Closed fracture of unspecified part of neck of femur 2011   Right.  Soreness when standing. (Dr. Durward [facility])   Nonischemic cardiomyopathy [facility])    a. 02/2013 [facility] CL: No ischemia, prob attenuation vs scar, EF 40%-->Med Rx.   Persistent atrial fibrillation ([place])    a. 02/2013: s/p TEE and attempted cardioversion x 2-->failed-->rate controlled with bb/digoxin, Xarelto initiated.   Past Surgical History:  Procedure Laterality Date   CARDIOVERSION N/A 02/18/2013   Procedure: CARDIOVERSION;  Surgeon: Jolaine Artist, MD;  Location: [facility] ENDOSCOPY;  Service: Cardiovascular;  Laterality: N/A;   CARDIOVERSION N/A 02/22/2013   Procedure: CARDIOVERSION;  Surgeon: Fay Records, MD;  Location: [facility] ENDOSCOPY;  Service: Cardiovascular;  Laterality: N/A;   MASTECTOMY  1999   Left breast with saline implant.   PARTIAL HIP ARTHROPLASTY Right    TEE WITHOUT CARDIOVERSION N/A 02/18/2013   Procedure: TRANSESOPHAGEAL ECHOCARDIOGRAM (TEE);  Surgeon: Jolaine Artist, MD;  Location: [facility] ENDOSCOPY;  Service: Cardiovascular;  Laterality: N/A;   tubal ligation     TUBAL LIGATION       A IV Location/Drains/Wounds Patient Lines/Drains/Airways Status     Active Line/Drains/Airways     Name Placement date Placement time Site Days   Peripheral IV 05/27/22 20 G 1.16" Anterior;Left Forearm 05/27/22  1223  Forearm  1   Peripheral IV 05/27/22 22 G Anterior;Right Hand 05/27/22  2016  Hand  1  Intake/Output Last 24 hours  Intake/Output Summary (Last 24 hours) at 05/28/2022 3762 Last data filed at 05/27/2022 1901 Gross per 24 hour  Intake 100 ml  Output --  Net 100 ml    Labs/Imaging Results for orders placed or performed during the hospital encounter of 05/27/22 (from the past 48 hour(s))  Resp panel by RT-PCR (RSV, Flu A&B, Covid) Anterior  Nasal Swab     Status: None   Collection Time: 05/27/22 11:15 AM   Specimen: Anterior Nasal Swab  Result Value Ref Range   SARS Coronavirus 2 by RT PCR NEGATIVE NEGATIVE    Comment: (NOTE) SARS-CoV-2 target nucleic acids are NOT DETECTED.  The SARS-CoV-2 RNA is generally detectable in upper respiratory specimens during the acute phase of infection. The lowest concentration of SARS-CoV-2 viral copies this assay can detect is 138 copies/mL. A negative result does not preclude SARS-Cov-2 infection and should not be used as the sole basis for treatment or other patient management decisions. A negative result may occur with  improper specimen collection/handling, submission of specimen other than nasopharyngeal swab, presence of viral mutation(s) within the areas targeted by this assay, and inadequate number of viral copies(<138 copies/mL). A negative result must be combined with clinical observations, patient history, and epidemiological information. The expected result is Negative.  Fact Sheet for Patients:  EntrepreneurPulse.com.au  Fact Sheet for Healthcare Providers:  IncredibleEmployment.be  This test is no t yet approved or cleared by the [place] FDA and  has been authorized for detection and/or diagnosis of SARS-CoV-2 by FDA under an Emergency Use Authorization (EUA). This EUA will remain  in effect (meaning this test can be used) for the duration of the COVID-19 declaration under Section 564(b)(1) of the Act, 21 U.S.C.section 360bbb-3(b)(1), unless the authorization is terminated  or revoked sooner.       Influenza A by PCR NEGATIVE NEGATIVE   Influenza B by PCR NEGATIVE NEGATIVE    Comment: (NOTE) The Xpert Xpress SARS-CoV-2/FLU/RSV plus assay is intended as an aid in the diagnosis of influenza from Nasopharyngeal swab specimens and should not be used as a sole basis for treatment. Nasal washings and aspirates are unacceptable  for Xpert Xpress SARS-CoV-2/FLU/RSV testing.  Fact Sheet for Patients: EntrepreneurPulse.com.au  Fact Sheet for Healthcare Providers: IncredibleEmployment.be  This test is not yet approved or cleared by the [place] FDA and has been authorized for detection and/or diagnosis of SARS-CoV-2 by FDA under an Emergency Use Authorization (EUA). This EUA will remain in effect (meaning this test can be used) for the duration of the COVID-19 declaration under Section 564(b)(1) of the Act, 21 U.S.C. section 360bbb-3(b)(1), unless the authorization is terminated or revoked.     Resp Syncytial Virus by PCR NEGATIVE NEGATIVE    Comment: (NOTE) Fact Sheet for Patients: EntrepreneurPulse.com.au  Fact Sheet for Healthcare Providers: IncredibleEmployment.be  This test is not yet approved or cleared by the [place] FDA and has been authorized for detection and/or diagnosis of SARS-CoV-2 by FDA under an Emergency Use Authorization (EUA). This EUA will remain in effect (meaning this test can be used) for the duration of the COVID-19 declaration under Section 564(b)(1) of the Act, 21 U.S.C. section 360bbb-3(b)(1), unless the authorization is terminated or revoked.  Performed at [facility], [place] [address] [place], [place] [address]   Comprehensive metabolic panel     Status: Abnormal   Collection Time: 05/27/22 11:43 AM  Result Value Ref Range   Sodium 136 135 - 145 mmol/L  Potassium 3.0 (L) 3.5 - 5.1 mmol/L   Chloride 100 98 - 111 mmol/L   CO2 23 22 - 32 mmol/L   Glucose, Bld 105 (H) 70 - 99 mg/dL    Comment: Glucose reference range applies only to samples taken after fasting for at least 8 hours.   BUN 15 8 - 23 mg/dL   Creatinine, Ser 1.22 (H) 0.44 - 1.00 mg/dL   Calcium 9.6 8.9 - 10.3 mg/dL   Total Protein 6.5 6.5 - 8.1 g/dL   Albumin 2.9 (L) 3.5 - 5.0 g/dL   AST 16 15 - 41 U/L   ALT 10 0 - 44  U/L   Alkaline Phosphatase 84 38 - 126 U/L   Total Bilirubin 0.7 0.3 - 1.2 mg/dL   GFR, Estimated 46 (L) >60 mL/min    Comment: (NOTE) Calculated using the CKD-EPI Creatinine Equation (2021)    Anion gap 13 5 - 15    Comment: Performed at [facility] [address], [place], [place] [address]  CBC with Differential/Platelet     Status: Abnormal   Collection Time: 05/27/22 11:43 AM  Result Value Ref Range   WBC 8.5 4.0 - 10.5 K/uL   RBC 3.91 3.87 - 5.11 MIL/uL   Hemoglobin 9.0 (L) 12.0 - 15.0 g/dL   HCT 31.2 (L) 36.0 - 46.0 %   MCV 79.8 (L) 80.0 - 100.0 fL   MCH 23.0 (L) 26.0 - 34.0 pg   MCHC 28.8 (L) 30.0 - 36.0 g/dL   RDW 16.5 (H) 11.5 - 15.5 %   Platelets 467 (H) 150 - 400 K/uL   nRBC 0.0 0.0 - 0.2 %   Neutrophils Relative % 69 %   Neutro Abs 5.9 1.7 - 7.7 K/uL   Lymphocytes Relative 19 %   Lymphs Abs 1.7 0.7 - 4.0 K/uL   Monocytes Relative 9 %   Monocytes Absolute 0.8 0.1 - 1.0 K/uL   Eosinophils Relative 1 %   Eosinophils Absolute 0.1 0.0 - 0.5 K/uL   Basophils Relative 1 %   Basophils Absolute 0.1 0.0 - 0.1 K/uL   Immature Granulocytes 1 %   Abs Immature Granulocytes 0.07 0.00 - 0.07 K/uL    Comment: Performed at [facility], [place] [address] [place], [place] [address]  Troponin I (High Sensitivity)     Status: None   Collection Time: 05/27/22 11:43 AM  Result Value Ref Range   Troponin I (High Sensitivity) 5 <18 ng/L    Comment: (NOTE) Elevated high sensitivity troponin I (hsTnI) values and significant  changes across serial measurements may suggest ACS but many other  chronic and acute conditions are known to elevate hsTnI results.  Refer to the "Links" section for chest pain algorithms and additional  guidance. Performed at [facility], [place] [address] [place], [place] [address]   POC occult blood, ED     Status: Abnormal   Collection Time: 05/27/22  2:02 PM  Result Value Ref Range   Fecal Occult Bld POSITIVE (A) NEGATIVE  Brain  natriuretic peptide     Status: Abnormal   Collection Time: 05/27/22  6:09 PM  Result Value Ref Range   B Natriuretic Peptide 225.7 (H) 0.0 - 100.0 pg/mL    Comment: Performed at [facility] [address] [place],  [address]  Troponin I (High Sensitivity)     Status: None   Collection Time: 05/27/22  6:09 PM  Result Value Ref Range  Troponin I (High Sensitivity) 5 <18 ng/L    Comment: (NOTE) Elevated high sensitivity troponin I (hsTnI) values and significant  changes across serial measurements may suggest ACS but many other  chronic and acute conditions are known to elevate hsTnI results.  Refer to the "Links" section for chest pain algorithms and additional  guidance. Performed at [facility], [place] [address] [place], [place] [address]   Folate     Status: None   Collection Time: 05/27/22  6:09 PM  Result Value Ref Range   Folate 7.3 >5.9 ng/mL    Comment: Performed at [facility], [place] [address] [place], [place] [address]  Magnesium     Status: None   Collection Time: 05/28/22  1:20 AM  Result Value Ref Range   Magnesium 2.0 1.7 - 2.4 mg/dL    Comment: Performed at [facility] [address], [place], [place] [address]  Sedimentation rate     Status: Abnormal   Collection Time: 05/28/22  1:20 AM  Result Value Ref Range   Sed Rate 57 (H) 0 - 22 mm/hr    Comment: Performed at [facility] [address], [place], [place] [address]  CK     Status: Abnormal   Collection Time: 05/28/22  1:20 AM  Result Value Ref Range   Total CK 34 (L) 38 - 234 U/L    Comment: Performed at [facility], [place] [address] [place], [place] [address]  CBC     Status: Abnormal   Collection Time: 05/28/22  1:20 AM  Result Value Ref Range   WBC 7.6 4.0 - 10.5 K/uL   RBC 3.44 (L) 3.87 - 5.11 MIL/uL   Hemoglobin 8.0 (L) 12.0 - 15.0 g/dL   HCT 27.2 (L) 36.0 - 46.0 %   MCV 79.1 (L) 80.0 - 100.0 fL   MCH 23.3 (L) 26.0 - 34.0 pg   MCHC 29.4 (L) 30.0  - 36.0 g/dL   RDW 16.7 (H) 11.5 - 15.5 %   Platelets 403 (H) 150 - 400 K/uL   nRBC 0.0 0.0 - 0.2 %    Comment: Performed at [facility] [address], [place], [place] [address]  Heparin level (unfractionated)     Status: None   Collection Time: 05/28/22  1:20 AM  Result Value Ref Range   Heparin Unfractionated 0.45 0.30 - 0.70 IU/mL    Comment: (NOTE) The clinical reportable range upper limit is being lowered to >1.10 to align with the FDA approved guidance for the current laboratory assay.  If heparin results are below expected values, and patient dosage has  been confirmed, suggest follow up testing of antithrombin III levels. Performed at [facility], [place] [address] [place], [place] [address]   Comprehensive metabolic panel     Status: Abnormal   Collection Time: 05/28/22  1:20 AM  Result Value Ref Range   Sodium 136 135 - 145 mmol/L   Potassium 4.4 3.5 - 5.1 mmol/L   Chloride 105 98 - 111 mmol/L   CO2 21 (L) 22 - 32 mmol/L   Glucose, Bld 98 70 - 99 mg/dL    Comment: Glucose reference range applies only to samples taken after fasting for at least 8 hours.   BUN 13 8 - 23 mg/dL   Creatinine, Ser 1.13 (H) 0.44 - 1.00 mg/dL   Calcium 8.7 (L) 8.9 - 10.3 mg/dL   Total Protein 5.6 (L) 6.5 -  8.1 g/dL   Albumin 2.6 (L) 3.5 - 5.0 g/dL   AST 17 15 - 41 U/L   ALT 7 0 - 44 U/L   Alkaline Phosphatase 76 38 - 126 U/L   Total Bilirubin 0.6 0.3 - 1.2 mg/dL   GFR, Estimated 50 (L) >60 mL/min    Comment: (NOTE) Calculated using the CKD-EPI Creatinine Equation (2021)    Anion gap 10 5 - 15    Comment: Performed at [facility] [address], [place], [place] [address]  Heparin level (unfractionated)     Status: None   Collection Time: 05/28/22  4:25 AM  Result Value Ref Range   Heparin Unfractionated 0.37 0.30 - 0.70 IU/mL    Comment: (NOTE) The clinical reportable range upper limit is being lowered to >1.10 to align with the FDA approved guidance for the  current laboratory assay.  If heparin results are below expected values, and patient dosage has  been confirmed, suggest follow up testing of antithrombin III levels. Performed at [facility], [place] [address], [place], [place] [address]    CT CHEST WO CONTRAST  Result Date: 05/27/2022 CLINICAL DATA:  Chronic/persisting cough. EXAM: CT CHEST WITHOUT CONTRAST TECHNIQUE: Multidetector CT imaging of the chest was performed following the standard protocol without IV contrast. RADIATION DOSE REDUCTION: This exam was performed according to the departmental dose-optimization program which includes automated exposure control, adjustment of the mA and/or kV according to patient size and/or use of iterative reconstruction technique. COMPARISON:  None Available. FINDINGS: Cardiovascular: The heart is enlarged and there is no pericardial effusion. Three-vessel coronary artery calcifications are noted. There is atherosclerotic calcification of the aorta without evidence of aneurysm. The pulmonary trunk is normal in caliber. Mediastinum/Nodes: No mediastinal or axillary lymphadenopathy. Evaluation of the hila is limited due to lack of IV contrast. The trachea and esophagus are within normal limits. There is a 1.2 cm nodule in the thyroid isthmus. There is a small hiatal hernia. Lungs/Pleura: Mild apical pleural scarring is present bilaterally. Mild atelectasis or scarring is noted bilaterally. No consolidation, effusion, or pneumothorax. Upper Abdomen: No acute abnormality. Musculoskeletal: A left breast implant is noted. Degenerative changes are present in the thoracic spine. IMPRESSION: 1. No acute process. 2. Small hiatal hernia. 3. Aortic atherosclerosis and coronary artery calcifications. Electronically Signed   By: Brett Fairy M.D.   On: 05/27/2022 21:45   DG Chest 1 View  Result Date: 05/27/2022 CLINICAL DATA:  Malaise symptoms for 1 week. EXAM: CHEST  1 VIEW COMPARISON:  None Available. FINDINGS:  The heart size and mediastinal contours are within normal limits. Both lungs are clear. The visualized skeletal structures are unremarkable. IMPRESSION: No active disease. Electronically Signed   By: Nolon Nations M.D.   On: 05/27/2022 11:50    Pending Labs Unresulted Labs (From admission, onward)     Start     Ordered   05/28/22 0500  Heparin level (unfractionated)  Daily,   R     See Hyperspace for full Linked Orders Report.   05/27/22 1800   05/28/22 0500  CBC  Daily,   R     See Hyperspace for full Linked Orders Report.   05/27/22 1800   05/28/22 0500  C-reactive protein  Once,   AD        05/28/22 0500   05/28/22 0500  Iron and TIBC  Once,   AD        05/28/22 0500   05/28/22 0500  Ferritin  Once,   R        05/28/22 0500   05/27/22 1351  Occult blood card to lab, stool  Once,   URGENT        05/27/22 1350            Vitals/Pain Today's Vitals   05/28/22 0600 05/28/22 0615 05/28/22 0630 05/28/22 0645  BP: (!) 112/57 110/79 (!) 118/58 107/60  Pulse: (!) 111 96 95 (!) 106  Resp: '12 13 15 20  '$ Temp:      TempSrc:      SpO2: 100% 99% 100% 100%  Weight:      Height:      PainSc:        Isolation Precautions No active isolations  Medications Medications  cyanocobalamin (VITAMIN B12) injection 1,000 mcg (1,000 mcg Intramuscular Given 05/27/22 2017)  acetaminophen (TYLENOL) tablet 650 mg (650 mg Oral Given 05/28/22 0250)    Or  acetaminophen (TYLENOL) suppository 650 mg ( Rectal See Alternative 05/28/22 0250)  amiodarone (NEXTERONE) 1.8 mg/mL load via infusion 150 mg (150 mg Intravenous Bolus from Bag 05/27/22 1942)    Followed by  amiodarone (NEXTERONE PREMIX) 360-4.14 MG/200ML-% (1.8 mg/mL) IV infusion (0 mg/hr Intravenous Stopped 05/27/22 2334)    Followed by  amiodarone (NEXTERONE PREMIX) 360-4.14 MG/200ML-% (1.8 mg/mL) IV infusion (30 mg/hr Intravenous Rate/Dose Verify 05/28/22 0708)  heparin ADULT infusion 100 units/mL (25000 units/287m) (1,000 Units/hr  Intravenous Rate/Dose Verify 05/28/22 0707)  sodium chloride 0.9 % bolus 500 mL (0 mLs Intravenous Stopped 05/27/22 1259)  potassium chloride 10 mEq in 100 mL IVPB (0 mEq Intravenous Stopped 05/27/22 1901)  potassium chloride SA (KLOR-CON M) CR tablet 40 mEq (40 mEq Oral Given 05/27/22 1738)  heparin bolus via infusion 2,000 Units (2,000 Units Intravenous Bolus from Bag 05/27/22 2018)  potassium chloride SA (KLOR-CON M) CR tablet 40 mEq (40 mEq Oral Given 05/27/22 2139)    Mobility walks with person assist Low fall risk   R Recommendations: See Admitting Provider Note  Report given to:   Additional Notes: amiodarone '@16'$ .692mhr, heparin 1081mr. HX: Afib, CKD, CHF. Had + fecal occult, RA, PT/OT consults

## 2022-05-28 NOTE — Consult Note (Addendum)
Attending physician's note   I have taken a history, reviewed the chart, and examined the patient. I performed a substantive portion of this encounter, including complete performance of at least one of the key components, in conjunction with the APP. I agree with the APP's note, impression, and recommendations with my edits.   76 year old female with medical history as outlined below, to include history of atrial fibrillation (on Xarelto), CHF (EF 55-60%), CM, admitted with A-fib with RVR.  GI service consulted due to microcytic anemia and heme positive stool.  She denies any overt bleeding.  Does have weakness, lightheadedness which prompted PCM to check labs on 12/15, notable for the following: - H/H 9.7/31, MCV/RDW 76/16.7.  B12 166  Labs on admission notable for the following: - H/H 8/27.2 (downtrending) with MCV/RDW 79/16.7 - Folate 7.3 - Iron panel pending - ESR 57.  CRP pending - Normal renal function - CT chest: Small hiatal hernia, otherwise no acute CP disease  Last available labs for comparison were 05/2020 with H/H 14/41.5 and MCV/RDW 93/12.  No previous EGD or colonoscopy.  Family history notable for father with CRC.  Cardiology service consulted for management of A-fib with RVR.  Stopped Xarelto and started on heparin GTT, started amiodarone and metoprolol.  Per Cardiology note, okay to proceed with EGD/colonoscopy from CV standpoint.  1) Microcytic anemia 2) Heme positive stool 3) Chronic anticoagulation 4) Family history of colon cancer (father) 37) B12 deficiency - Clears today with bowel prep this evening - EGD/colonoscopy tomorrow for diagnostic and potentially therapeutic intent pending CV status - Will follow-up on pending iron studies - Started on B12 - Hold heparin 4 hours prior to procedures - Continue serial CBC checks with blood products as needed per protocol  6)  A-fib with RVR 7) CHF 8) Cardiomyopathy - Management per Cardiology consult service - Per notes, likely plan for apixaban over Xarelto at discharge  I discussed the risks, benefits, alternatives of EGD and colonoscopy with the patient today, and she wishes to proceed  [name], [place] [phone] office          [facility] Consult: 8:11 AM 05/28/2022  LOS: 1 day    Referring Provider: Dr. Melven Sartorius Primary Care Physician:  Tonia Ghent, MD Primary Gastroenterologist: None, never    Reason for Consultation:  Microcytic anemia.  FOBT +.  Needs Xarelto.     HPI: Natasha Dickson is a 76 y.o. female. PMH CHF.  Non-ischemic CM.  LVEF 55 to 60%.  Afib.  Chronic xarelto.  CKD 2. Breast Ca 2001.  L mastectomy, saline implant.  Basal cell Ca on scalp 2017.  Hoarding disorder.  Previously declined sugg for screening colonoscopy.    For several months patient's had anorexia and decreased p.o. intake.  At office visit last week current weight reflected about a 15 pound weight loss over 1 year.  Patient denies nausea, vomiting, dysphagia, abdominal pain.  Suffers from heartburn several times a week, Tums always resolves the problem.  Stools are [place] and occur every other day, her pattern for many years.  She has never seen melena, bleeding per rectum, change in stool caliber or consistency. Seen at PCP on 12/15 for eval of confusion.  Co weakness, lightheadedness, cough.  Chronic DOE.   Labs that day returned w microcytic anemia (see below).  Plan was to check anemia studies, FOBT. Yesterday dtrs reached out to PCP for pt's wweakness, headache, confusion. BP 80/59 at home.  Sent to ED.  SBPs 90s to 1teens.  A fib w rates up to 130.  No fever.  No hypoxia.   Covid 19 negative.   Hgb 9.7.Marland Kitchen 8 over last 5 days.  Last comp of 13.9 2 yrs ago.  MCV 79 Iron studies pndg.   B12 low.  Folate ok.   BNP 203 4 d ago.   CT chest: small HH.  No acute cardio-pulm  process.  Aortic atherosclerosis, coronary calcifications.    Lives w her husband.  Stayed last couple of nights w dtr as husband has poor hearing and family wanted pt monitored more closely.  Retired from working in an IT consultant.  Rare ETOH, 1 bottle wine lasts at least 1 month.  Never smoker.   Father had colon cancer, died w heart dz.  COPD in mom.  Heart dz in brother.  Congenital heart dz in son.    Past Medical History:  Diagnosis Date   Breast cancer ([place]) 1999   Stage I in left breast   Chronic combined systolic and diastolic CHF (congestive heart failure) ([place])    a. 02/2013 Echo: EF 30-35%, diff HK, biatrial enlargement. b. 2D echo 07/2014 showed mildly dilated LV, EF 45-50%, severely dilated left atrium and moderately dilated right atrium.   CKD (chronic kidney disease), stage II    Closed fracture of unspecified part of neck of femur 2011   Right.  Soreness when standing. (Dr. Durward Fortes)   Nonischemic cardiomyopathy [facility])    a. 02/2013 [facility] CL: No ischemia, prob attenuation vs scar, EF 40%-->Med Rx.   Persistent atrial fibrillation ([place])    a. 02/2013: s/p TEE and attempted cardioversion x 2-->failed-->rate controlled with bb/digoxin, Xarelto initiated.    Past Surgical History:  Procedure Laterality Date   CARDIOVERSION N/A 02/18/2013   Procedure: CARDIOVERSION;  Surgeon: Jolaine Artist, MD;  Location: [facility] ENDOSCOPY;  Service: Cardiovascular;  Laterality: N/A;   CARDIOVERSION N/A 02/22/2013   Procedure: CARDIOVERSION;  Surgeon: Fay Records, MD;  Location: [facility] ENDOSCOPY;  Service: Cardiovascular;  Laterality: N/A;   MASTECTOMY  1999   Left breast with saline implant.   PARTIAL HIP ARTHROPLASTY Right    TEE WITHOUT CARDIOVERSION N/A 02/18/2013   Procedure: TRANSESOPHAGEAL ECHOCARDIOGRAM (TEE);  Surgeon: Jolaine Artist, MD;  Location: [facility] ENDOSCOPY;  Service: Cardiovascular;  Laterality: N/A;   tubal ligation     TUBAL LIGATION      Prior to Admission medications    Medication Sig Start Date End Date Taking? Authorizing Provider  atorvastatin (LIPITOR) 10 MG tablet Take 1 tablet (10 mg total) by mouth daily. Held as of 05/17/22 05/17/22   Tonia Ghent, MD  cyanocobalamin (VITAMIN B12) 1000 MCG/ML injection 1000 mcg injected IM weekly x 4 doses then monthly thereafter. 05/26/22   Tonia Ghent, MD  cyclobenzaprine (FLEXERIL) 10 MG tablet Take 0.5-1 tablets (5-10 mg total) by mouth 3 (three) times daily as needed for muscle spasms (sedation caution). 05/24/22   Tonia Ghent, MD  diltiazem (CARDIZEM CD) 180 MG 24 hr capsule Take 1 capsule (180 mg total) by mouth daily. 02/26/22   Freada Bergeron, MD  furosemide (LASIX) 20 MG tablet Take 1 tablet (20 mg total) by mouth daily. 05/24/22   Tonia Ghent, MD  losartan (COZAAR) 25 MG tablet Hold as of 05/24/22 05/24/22   Tonia Ghent, MD  metoprolol succinate (TOPROL-XL) 100 MG 24 hr tablet TAKE 1 TABLET BY MOUTH ONCE DAILY WITH MEALS OR  IMMEDIATELY  AFTER  A  MEAL 02/26/22   Freada Bergeron, MD  nitroGLYCERIN (NITROSTAT) 0.4 MG SL tablet DISSOLVE ONE TABLET UNDER THE TONGUE EVERY 5 MINUTES AS NEEDED FOR CHEST PAIN.  DO NOT EXCEED A TOTAL OF 3 DOSES IN 15 MINUTES 10/14/19   Dorothy Spark, MD  rivaroxaban (XARELTO) 20 MG TABS tablet Take 1 tablet (20 mg total) by mouth daily. 04/15/22   Freada Bergeron, MD  spironolactone (ALDACTONE) 25 MG tablet Held as of 05/17/22 Patient not taking: Reported on 05/24/2022 05/17/22   Tonia Ghent, MD  traMADol (ULTRAM) 50 MG tablet Take 1 tablet (50 mg total) by mouth every 8 (eight) hours as needed. 01/01/21   Tonia Ghent, MD    Scheduled Meds:  cyanocobalamin  1,000 mcg Intramuscular Daily   Infusions:  amiodarone 30 mg/hr (05/28/22 0809)   heparin 1,000 Units/hr (05/28/22 0707)   PRN Meds: acetaminophen **OR** acetaminophen   Allergies as of 05/27/2022   (No Known Allergies)    Family History  Problem Relation Age of Onset    Congenital heart disease Son         transposition of great vessels- corrected with  MI  as an adult   COPD Mother        from long-term smoking   Colon cancer Father    Heart disease Father    Heart attack Father    Hypertension Father    Heart disease Brother    Stroke Neg Hx     Social History   Socioeconomic History   Marital status: Married    Spouse name: Not on file   Number of children: Not on file   Years of education: Not on file   Highest education level: Not on file  Occupational History   Occupation: Surveyor, quantity: [employer]  Tobacco Use   Smoking status: Never   Smokeless tobacco: Never  Vaping Use   Vaping Use: Never used  Substance and Sexual Activity   Alcohol use: Yes    Alcohol/week: 0.0 standard drinks of alcohol    Comment: RARE   Drug use: No   Sexual activity: Never  Other Topics Concern   Not on file  Social History Narrative   High School grad.   Married 40+ years   Scientist, water quality at [place] Strain: Low Risk  (10/24/2021)   Overall Financial Resource Strain (CARDIA)    Difficulty of Paying Living Expenses: Not hard at all  Food Insecurity: No Food Insecurity (10/24/2021)   Hunger Vital Sign    Worried About Running Out of Food in the Last Year: Never true    Ran Out of Food in the Last Year: Never true  Transportation Needs: No Transportation Needs (10/24/2021)   PRAPARE - Hydrologist (Medical): No    Lack of Transportation (Non-Medical): No  Physical Activity: Inactive (10/24/2021)   Exercise Vital Sign    Days of Exercise per Week: 0 days    Minutes of Exercise per Session: 0 min  Stress: No Stress Concern Present (10/24/2021)   [place]    Feeling of Stress : Not at all  Social Connections: Moderately Isolated (10/24/2021)   Social Connection and Isolation Panel [NHANES]     Frequency of Communication with Friends and Family: More than three times a week    Frequency of  Social Gatherings with Friends and Family: More than three times a week    Attends Religious Services: Never    Marine scientist or Organizations: No    Attends Archivist Meetings: Never    Marital Status: Married  Human resources officer Violence: Not At Risk (10/24/2021)   Humiliation, Afraid, Rape, and Kick questionnaire    Fear of Current or Ex-Partner: No    Emotionally Abused: No    Physically Abused: No    Sexually Abused: No    REVIEW OF SYSTEMS: Constitutional:  fatigue, weakness.   ENT:  No nose bleeds Pulm: Chronic dry cough since her diagnosis with A-fib. CV:  No angina no LE edema.  GU:  No hematuria, no frequency GI: See HPI Heme: Denies excessive or unusual bleeding or bruising. Transfusions: None. Neuro:  No peripheral tingling or numbness.  Mild dizziness.  No presyncope or syncope. Derm:  No itching, no rash or sores.  Endocrine:  No sweats or chills.  No polyuria or dysuria Immunization: Reviewed. Travel: Not queried.   PHYSICAL EXAM: Vital signs in last 24 hours: Vitals:   05/28/22 0630 05/28/22 0645  BP: (!) 118/58 107/60  Pulse: 95 (!) 106  Resp: 15 20  Temp:    SpO2: 100% 100%   Wt Readings from Last 3 Encounters:  05/27/22 93 kg  05/24/22 93 kg  05/17/22 95.3 kg    General: Overweight, alert, comfortable.  Somewhat pale but does not look acutely ill. Head: No signs of head trauma.  No facial swelling or asymmetry Eyes: Conjunctiva mildly pale.  EOMI Ears: No obvious hearing deficit Nose: No congestion or discharge Mouth: Tongue midline.  Mucosa moist, pink, clear.  Good dentition. Neck: No JVD, no masses, no thyromegaly Lungs: No labored breathing or cough.  Lung fields clear bilaterally. Heart: Irregularly irregular.  Rate in the low 100s.  No MRG.  S1, S2 present. Abdomen: Mildly obese, soft.  No distention.  Some fullness but no  distinct hepatomegaly in the right upper quadrant.  Exam is nontender.  No hernias, bruits..   Rectal: Deferred. Musc/Skeltl: No joint redness, swelling or gross deformity.  No sarcopenia. Extremities: No CCE Neurologic: Pleasant, calm.  Oriented x 3.  Good historian.  Moves all 4 limbs without tremor or gross deficit, formal strength testing not pursued. Skin: No telangiectasia, purpura, bruising, suspicious lesions or rash. Nodes: No cervical adenopathy Psych: Calm, cooperative, pleasant.  Intake/Output from previous day: 12/18 0701 - 12/19 0700 In: 100 [IV Piggyback:100] Out: -  Intake/Output this shift: No intake/output data recorded.  LAB RESULTS: Recent Labs    05/27/22 1143 05/28/22 0120  WBC 8.5 7.6  HGB 9.0* 8.0*  HCT 31.2* 27.2*  PLT 467* 403*   BMET Lab Results  Component Value Date   NA 136 05/28/2022   NA 136 05/27/2022   NA 136 05/24/2022   K 4.4 05/28/2022   K 3.0 (L) 05/27/2022   K 3.8 05/24/2022   CL 105 05/28/2022   CL 100 05/27/2022   CL 98 05/24/2022   CO2 21 (L) 05/28/2022   CO2 23 05/27/2022   CO2 28 05/24/2022   GLUCOSE 98 05/28/2022   GLUCOSE 105 (H) 05/27/2022   GLUCOSE 107 (H) 05/24/2022   BUN 13 05/28/2022   BUN 15 05/27/2022   BUN 20 05/24/2022   CREATININE 1.13 (H) 05/28/2022   CREATININE 1.22 (H) 05/27/2022   CREATININE 1.14 05/24/2022   CALCIUM 8.7 (L) 05/28/2022   CALCIUM 9.6 05/27/2022  CALCIUM 8.7 05/24/2022   LFT Recent Labs    05/27/22 1143 05/28/22 0120  PROT 6.5 5.6*  ALBUMIN 2.9* 2.6*  AST 16 17  ALT 10 7  ALKPHOS 84 76  BILITOT 0.7 0.6   PT/INR Lab Results  Component Value Date   INR 1.31 02/15/2013   INR 1.75 (H) 02/16/2010   INR 1.32 02/15/2010   Hepatitis Panel No results for input(s): "HEPBSAG", "HCVAB", "HEPAIGM", "HEPBIGM" in the last 72 hours. C-Diff No components found for: "CDIFF" Lipase  No results found for: "LIPASE"  Drugs of Abuse  No results found for: "LABOPIA", "COCAINSCRNUR",  "LABBENZ", "AMPHETMU", "THCU", "LABBARB"   RADIOLOGY STUDIES: CT CHEST WO CONTRAST  Result Date: 05/27/2022 CLINICAL DATA:  Chronic/persisting cough. EXAM: CT CHEST WITHOUT CONTRAST TECHNIQUE: Multidetector CT imaging of the chest was performed following the standard protocol without IV contrast. RADIATION DOSE REDUCTION: This exam was performed according to the departmental dose-optimization program which includes automated exposure control, adjustment of the mA and/or kV according to patient size and/or use of iterative reconstruction technique. COMPARISON:  None Available. FINDINGS: Cardiovascular: The heart is enlarged and there is no pericardial effusion. Three-vessel coronary artery calcifications are noted. There is atherosclerotic calcification of the aorta without evidence of aneurysm. The pulmonary trunk is normal in caliber. Mediastinum/Nodes: No mediastinal or axillary lymphadenopathy. Evaluation of the hila is limited due to lack of IV contrast. The trachea and esophagus are within normal limits. There is a 1.2 cm nodule in the thyroid isthmus. There is a small hiatal hernia. Lungs/Pleura: Mild apical pleural scarring is present bilaterally. Mild atelectasis or scarring is noted bilaterally. No consolidation, effusion, or pneumothorax. Upper Abdomen: No acute abnormality. Musculoskeletal: A left breast implant is noted. Degenerative changes are present in the thoracic spine. IMPRESSION: 1. No acute process. 2. Small hiatal hernia. 3. Aortic atherosclerosis and coronary artery calcifications. Electronically Signed   By: Brett Fairy M.D.   On: 05/27/2022 21:45   DG Chest 1 View  Result Date: 05/27/2022 CLINICAL DATA:  Malaise symptoms for 1 week. EXAM: CHEST  1 VIEW COMPARISON:  None Available. FINDINGS: The heart size and mediastinal contours are within normal limits. Both lungs are clear. The visualized skeletal structures are unremarkable. IMPRESSION: No active disease. Electronically  Signed   By: Nolon Nations M.D.   On: 05/27/2022 11:50      IMPRESSION:     Microcytic anemia.  B12 low and injection B12 now in place.  Folate ok.  Waiting on iron studies.  No transfusions yet.      FOBT positive    Non-ischemic CM, diastoli/systolic.    LVEF 55 to 60%.      Afib.  Presenting w rapid rate in setting of anemia.  Chronic Xarelto.  Last dode 12/17.  Currently on hold w IV heparin in place.      PLAN:     Colon, EGD once cardiology clears her for procedures.  Could pursue this as soon as tomorrow.  In anticipation of possible procedures tomorrow, switching diet to clear liquids.  Risks, benefits, prep discussed with patient and she is agreeable to proceed.    Await results of iron studies.     Azucena Freed  05/28/2022, 8:11 AM Phone [phone]

## 2022-05-28 NOTE — H&P (View-Only) (Signed)
Attending physician's note   I have taken a history, reviewed the chart, and examined the patient. I performed a substantive portion of this encounter, including complete performance of at least one of the key components, in conjunction with the APP. I agree with the APP's note, impression, and recommendations with my edits.   76 year old female with medical history as outlined below, to include history of atrial fibrillation (on Xarelto), CHF (EF 55-60%), CM, admitted with A-fib with RVR.  GI service consulted due to microcytic anemia and heme positive stool.  She denies any overt bleeding.  Does have weakness, lightheadedness which prompted PCM to check labs on 12/15, notable for the following: - H/H 9.7/31, MCV/RDW 76/16.7.  B12 166  Labs on admission notable for the following: - H/H 8/27.2 (downtrending) with MCV/RDW 79/16.7 - Folate 7.3 - Iron panel pending - ESR 57.  CRP pending - Normal renal function - CT chest: Small hiatal hernia, otherwise no acute CP disease  Last available labs for comparison were 05/2020 with H/H 14/41.5 and MCV/RDW 93/12.  No previous EGD or colonoscopy.  Family history notable for father with CRC.  Cardiology service consulted for management of A-fib with RVR.  Stopped Xarelto and started on heparin GTT, started amiodarone and metoprolol.  Per Cardiology note, okay to proceed with EGD/colonoscopy from CV standpoint.  1) Microcytic anemia 2) Heme positive stool 3) Chronic anticoagulation 4) Family history of colon cancer (father) 37) B12 deficiency - Clears today with bowel prep this evening - EGD/colonoscopy tomorrow for diagnostic and potentially therapeutic intent pending CV status - Will follow-up on pending iron studies - Started on B12 - Hold heparin 4 hours prior to procedures - Continue serial CBC checks with blood products as needed per protocol  6)  A-fib with RVR 7) CHF 8) Cardiomyopathy - Management per Cardiology consult service - Per notes, likely plan for apixaban over Xarelto at discharge  I discussed the risks, benefits, alternatives of EGD and colonoscopy with the patient today, and she wishes to proceed  [name], [place] [phone] office          [facility] Consult: 8:11 AM 05/28/2022  LOS: 1 day    Referring Provider: Dr. Melven Sartorius Primary Care Physician:  Tonia Ghent, MD Primary Gastroenterologist: None, never    Reason for Consultation:  Microcytic anemia.  FOBT +.  Needs Xarelto.     HPI: TASHAI CATINO is a 76 y.o. female. PMH CHF.  Non-ischemic CM.  LVEF 55 to 60%.  Afib.  Chronic xarelto.  CKD 2. Breast Ca 2001.  L mastectomy, saline implant.  Basal cell Ca on scalp 2017.  Hoarding disorder.  Previously declined sugg for screening colonoscopy.    For several months patient's had anorexia and decreased p.o. intake.  At office visit last week current weight reflected about a 15 pound weight loss over 1 year.  Patient denies nausea, vomiting, dysphagia, abdominal pain.  Suffers from heartburn several times a week, Tums always resolves the problem.  Stools are [place] and occur every other day, her pattern for many years.  She has never seen melena, bleeding per rectum, change in stool caliber or consistency. Seen at PCP on 12/15 for eval of confusion.  Co weakness, lightheadedness, cough.  Chronic DOE.   Labs that day returned w microcytic anemia (see below).  Plan was to check anemia studies, FOBT. Yesterday dtrs reached out to PCP for pt's wweakness, headache, confusion. BP 80/59 at home.  Sent to ED.  SBPs 90s to 1teens.  A fib w rates up to 130.  No fever.  No hypoxia.   Covid 19 negative.   Hgb 9.7.Marland Kitchen 8 over last 5 days.  Last comp of 13.9 2 yrs ago.  MCV 79 Iron studies pndg.   B12 low.  Folate ok.   BNP 203 4 d ago.   CT chest: small HH.  No acute cardio-pulm  process.  Aortic atherosclerosis, coronary calcifications.    Lives w her husband.  Stayed last couple of nights w dtr as husband has poor hearing and family wanted pt monitored more closely.  Retired from working in an IT consultant.  Rare ETOH, 1 bottle wine lasts at least 1 month.  Never smoker.   Father had colon cancer, died w heart dz.  COPD in mom.  Heart dz in brother.  Congenital heart dz in son.    Past Medical History:  Diagnosis Date   Breast cancer ([place]) 1999   Stage I in left breast   Chronic combined systolic and diastolic CHF (congestive heart failure) ([place])    a. 02/2013 Echo: EF 30-35%, diff HK, biatrial enlargement. b. 2D echo 07/2014 showed mildly dilated LV, EF 45-50%, severely dilated left atrium and moderately dilated right atrium.   CKD (chronic kidney disease), stage II    Closed fracture of unspecified part of neck of femur 2011   Right.  Soreness when standing. (Dr. Durward Fortes)   Nonischemic cardiomyopathy [facility])    a. 02/2013 [facility] CL: No ischemia, prob attenuation vs scar, EF 40%-->Med Rx.   Persistent atrial fibrillation ([place])    a. 02/2013: s/p TEE and attempted cardioversion x 2-->failed-->rate controlled with bb/digoxin, Xarelto initiated.    Past Surgical History:  Procedure Laterality Date   CARDIOVERSION N/A 02/18/2013   Procedure: CARDIOVERSION;  Surgeon: Jolaine Artist, MD;  Location: [facility] ENDOSCOPY;  Service: Cardiovascular;  Laterality: N/A;   CARDIOVERSION N/A 02/22/2013   Procedure: CARDIOVERSION;  Surgeon: Fay Records, MD;  Location: [facility] ENDOSCOPY;  Service: Cardiovascular;  Laterality: N/A;   MASTECTOMY  1999   Left breast with saline implant.   PARTIAL HIP ARTHROPLASTY Right    TEE WITHOUT CARDIOVERSION N/A 02/18/2013   Procedure: TRANSESOPHAGEAL ECHOCARDIOGRAM (TEE);  Surgeon: Jolaine Artist, MD;  Location: [facility] ENDOSCOPY;  Service: Cardiovascular;  Laterality: N/A;   tubal ligation     TUBAL LIGATION      Prior to Admission medications    Medication Sig Start Date End Date Taking? Authorizing Provider  atorvastatin (LIPITOR) 10 MG tablet Take 1 tablet (10 mg total) by mouth daily. Held as of 05/17/22 05/17/22   Tonia Ghent, MD  cyanocobalamin (VITAMIN B12) 1000 MCG/ML injection 1000 mcg injected IM weekly x 4 doses then monthly thereafter. 05/26/22   Tonia Ghent, MD  cyclobenzaprine (FLEXERIL) 10 MG tablet Take 0.5-1 tablets (5-10 mg total) by mouth 3 (three) times daily as needed for muscle spasms (sedation caution). 05/24/22   Tonia Ghent, MD  diltiazem (CARDIZEM CD) 180 MG 24 hr capsule Take 1 capsule (180 mg total) by mouth daily. 02/26/22   Freada Bergeron, MD  furosemide (LASIX) 20 MG tablet Take 1 tablet (20 mg total) by mouth daily. 05/24/22   Tonia Ghent, MD  losartan (COZAAR) 25 MG tablet Hold as of 05/24/22 05/24/22   Tonia Ghent, MD  metoprolol succinate (TOPROL-XL) 100 MG 24 hr tablet TAKE 1 TABLET BY MOUTH ONCE DAILY WITH MEALS OR  IMMEDIATELY  AFTER  A  MEAL 02/26/22   Freada Bergeron, MD  nitroGLYCERIN (NITROSTAT) 0.4 MG SL tablet DISSOLVE ONE TABLET UNDER THE TONGUE EVERY 5 MINUTES AS NEEDED FOR CHEST PAIN.  DO NOT EXCEED A TOTAL OF 3 DOSES IN 15 MINUTES 10/14/19   Dorothy Spark, MD  rivaroxaban (XARELTO) 20 MG TABS tablet Take 1 tablet (20 mg total) by mouth daily. 04/15/22   Freada Bergeron, MD  spironolactone (ALDACTONE) 25 MG tablet Held as of 05/17/22 Patient not taking: Reported on 05/24/2022 05/17/22   Tonia Ghent, MD  traMADol (ULTRAM) 50 MG tablet Take 1 tablet (50 mg total) by mouth every 8 (eight) hours as needed. 01/01/21   Tonia Ghent, MD    Scheduled Meds:  cyanocobalamin  1,000 mcg Intramuscular Daily   Infusions:  amiodarone 30 mg/hr (05/28/22 0809)   heparin 1,000 Units/hr (05/28/22 0707)   PRN Meds: acetaminophen **OR** acetaminophen   Allergies as of 05/27/2022   (No Known Allergies)    Family History  Problem Relation Age of Onset    Congenital heart disease Son         transposition of great vessels- corrected with  MI  as an adult   COPD Mother        from long-term smoking   Colon cancer Father    Heart disease Father    Heart attack Father    Hypertension Father    Heart disease Brother    Stroke Neg Hx     Social History   Socioeconomic History   Marital status: Married    Spouse name: Not on file   Number of children: Not on file   Years of education: Not on file   Highest education level: Not on file  Occupational History   Occupation: Surveyor, quantity: [employer]  Tobacco Use   Smoking status: Never   Smokeless tobacco: Never  Vaping Use   Vaping Use: Never used  Substance and Sexual Activity   Alcohol use: Yes    Alcohol/week: 0.0 standard drinks of alcohol    Comment: RARE   Drug use: No   Sexual activity: Never  Other Topics Concern   Not on file  Social History Narrative   High School grad.   Married 40+ years   Scientist, water quality at [employer] Strain: Low Risk  (10/24/2021)   Overall Financial Resource Strain (CARDIA)    Difficulty of Paying Living Expenses: Not hard at all  Food Insecurity: No Food Insecurity (10/24/2021)   Hunger Vital Sign    Worried About Running Out of Food in the Last Year: Never true    Ran Out of Food in the Last Year: Never true  Transportation Needs: No Transportation Needs (10/24/2021)   PRAPARE - Hydrologist (Medical): No    Lack of Transportation (Non-Medical): No  Physical Activity: Inactive (10/24/2021)   Exercise Vital Sign    Days of Exercise per Week: 0 days    Minutes of Exercise per Session: 0 min  Stress: No Stress Concern Present (10/24/2021)   [place]    Feeling of Stress : Not at all  Social Connections: Moderately Isolated (10/24/2021)   Social Connection and Isolation Panel [NHANES]     Frequency of Communication with Friends and Family: More than three times a week    Frequency of  Social Gatherings with Friends and Family: More than three times a week    Attends Religious Services: Never    Marine scientist or Organizations: No    Attends Archivist Meetings: Never    Marital Status: Married  Human resources officer Violence: Not At Risk (10/24/2021)   Humiliation, Afraid, Rape, and Kick questionnaire    Fear of Current or Ex-Partner: No    Emotionally Abused: No    Physically Abused: No    Sexually Abused: No    REVIEW OF SYSTEMS: Constitutional:  fatigue, weakness.   ENT:  No nose bleeds Pulm: Chronic dry cough since her diagnosis with A-fib. CV:  No angina no LE edema.  GU:  No hematuria, no frequency GI: See HPI Heme: Denies excessive or unusual bleeding or bruising. Transfusions: None. Neuro:  No peripheral tingling or numbness.  Mild dizziness.  No presyncope or syncope. Derm:  No itching, no rash or sores.  Endocrine:  No sweats or chills.  No polyuria or dysuria Immunization: Reviewed. Travel: Not queried.   PHYSICAL EXAM: Vital signs in last 24 hours: Vitals:   05/28/22 0630 05/28/22 0645  BP: (!) 118/58 107/60  Pulse: 95 (!) 106  Resp: 15 20  Temp:    SpO2: 100% 100%   Wt Readings from Last 3 Encounters:  05/27/22 93 kg  05/24/22 93 kg  05/17/22 95.3 kg    General: Overweight, alert, comfortable.  Somewhat pale but does not look acutely ill. Head: No signs of head trauma.  No facial swelling or asymmetry Eyes: Conjunctiva mildly pale.  EOMI Ears: No obvious hearing deficit Nose: No congestion or discharge Mouth: Tongue midline.  Mucosa moist, pink, clear.  Good dentition. Neck: No JVD, no masses, no thyromegaly Lungs: No labored breathing or cough.  Lung fields clear bilaterally. Heart: Irregularly irregular.  Rate in the low 100s.  No MRG.  S1, S2 present. Abdomen: Mildly obese, soft.  No distention.  Some fullness but no  distinct hepatomegaly in the right upper quadrant.  Exam is nontender.  No hernias, bruits..   Rectal: Deferred. Musc/Skeltl: No joint redness, swelling or gross deformity.  No sarcopenia. Extremities: No CCE Neurologic: Pleasant, calm.  Oriented x 3.  Good historian.  Moves all 4 limbs without tremor or gross deficit, formal strength testing not pursued. Skin: No telangiectasia, purpura, bruising, suspicious lesions or rash. Nodes: No cervical adenopathy Psych: Calm, cooperative, pleasant.  Intake/Output from previous day: 12/18 0701 - 12/19 0700 In: 100 [IV Piggyback:100] Out: -  Intake/Output this shift: No intake/output data recorded.  LAB RESULTS: Recent Labs    05/27/22 1143 05/28/22 0120  WBC 8.5 7.6  HGB 9.0* 8.0*  HCT 31.2* 27.2*  PLT 467* 403*   BMET Lab Results  Component Value Date   NA 136 05/28/2022   NA 136 05/27/2022   NA 136 05/24/2022   K 4.4 05/28/2022   K 3.0 (L) 05/27/2022   K 3.8 05/24/2022   CL 105 05/28/2022   CL 100 05/27/2022   CL 98 05/24/2022   CO2 21 (L) 05/28/2022   CO2 23 05/27/2022   CO2 28 05/24/2022   GLUCOSE 98 05/28/2022   GLUCOSE 105 (H) 05/27/2022   GLUCOSE 107 (H) 05/24/2022   BUN 13 05/28/2022   BUN 15 05/27/2022   BUN 20 05/24/2022   CREATININE 1.13 (H) 05/28/2022   CREATININE 1.22 (H) 05/27/2022   CREATININE 1.14 05/24/2022   CALCIUM 8.7 (L) 05/28/2022   CALCIUM 9.6 05/27/2022  CALCIUM 8.7 05/24/2022   LFT Recent Labs    05/27/22 1143 05/28/22 0120  PROT 6.5 5.6*  ALBUMIN 2.9* 2.6*  AST 16 17  ALT 10 7  ALKPHOS 84 76  BILITOT 0.7 0.6   PT/INR Lab Results  Component Value Date   INR 1.31 02/15/2013   INR 1.75 (H) 02/16/2010   INR 1.32 02/15/2010   Hepatitis Panel No results for input(s): "HEPBSAG", "HCVAB", "HEPAIGM", "HEPBIGM" in the last 72 hours. C-Diff No components found for: "CDIFF" Lipase  No results found for: "LIPASE"  Drugs of Abuse  No results found for: "LABOPIA", "COCAINSCRNUR",  "LABBENZ", "AMPHETMU", "THCU", "LABBARB"   RADIOLOGY STUDIES: CT CHEST WO CONTRAST  Result Date: 05/27/2022 CLINICAL DATA:  Chronic/persisting cough. EXAM: CT CHEST WITHOUT CONTRAST TECHNIQUE: Multidetector CT imaging of the chest was performed following the standard protocol without IV contrast. RADIATION DOSE REDUCTION: This exam was performed according to the departmental dose-optimization program which includes automated exposure control, adjustment of the mA and/or kV according to patient size and/or use of iterative reconstruction technique. COMPARISON:  None Available. FINDINGS: Cardiovascular: The heart is enlarged and there is no pericardial effusion. Three-vessel coronary artery calcifications are noted. There is atherosclerotic calcification of the aorta without evidence of aneurysm. The pulmonary trunk is normal in caliber. Mediastinum/Nodes: No mediastinal or axillary lymphadenopathy. Evaluation of the hila is limited due to lack of IV contrast. The trachea and esophagus are within normal limits. There is a 1.2 cm nodule in the thyroid isthmus. There is a small hiatal hernia. Lungs/Pleura: Mild apical pleural scarring is present bilaterally. Mild atelectasis or scarring is noted bilaterally. No consolidation, effusion, or pneumothorax. Upper Abdomen: No acute abnormality. Musculoskeletal: A left breast implant is noted. Degenerative changes are present in the thoracic spine. IMPRESSION: 1. No acute process. 2. Small hiatal hernia. 3. Aortic atherosclerosis and coronary artery calcifications. Electronically Signed   By: Brett Fairy M.D.   On: 05/27/2022 21:45   DG Chest 1 View  Result Date: 05/27/2022 CLINICAL DATA:  Malaise symptoms for 1 week. EXAM: CHEST  1 VIEW COMPARISON:  None Available. FINDINGS: The heart size and mediastinal contours are within normal limits. Both lungs are clear. The visualized skeletal structures are unremarkable. IMPRESSION: No active disease. Electronically  Signed   By: Nolon Nations M.D.   On: 05/27/2022 11:50      IMPRESSION:     Microcytic anemia.  B12 low and injection B12 now in place.  Folate ok.  Waiting on iron studies.  No transfusions yet.      FOBT positive    Non-ischemic CM, diastoli/systolic.    LVEF 55 to 60%.      Afib.  Presenting w rapid rate in setting of anemia.  Chronic Xarelto.  Last dode 12/17.  Currently on hold w IV heparin in place.      PLAN:     Colon, EGD once cardiology clears her for procedures.  Could pursue this as soon as tomorrow.  In anticipation of possible procedures tomorrow, switching diet to clear liquids.  Risks, benefits, prep discussed with patient and she is agreeable to proceed.    Await results of iron studies.     Azucena Freed  05/28/2022, 8:11 AM Phone [phone]

## 2022-05-28 NOTE — Progress Notes (Signed)
PROGRESS NOTE    BEXLEIGH THERIAULT  DGL:[id number] DOB: December 02, 1945 DOA: 05/27/2022 PCP: Tonia Ghent, MD  Chief Complaint  Patient presents with   Weakness    Brief Narrative:  CHENEY GOSCH is Justis Dupas 76 y.o. female with medical history significant of atrial fibrillation on xarelto, systolic HF (recovered EF), non ischemic cardiomyopathy, hypertension and multiple other medical issues here with progressive generalized weakness.  Found to have new anemia and + FOBT as well as Lovelace Cerveny fib with RVR.  Plan for EGD/colonoscopy 12/20.    See below for additional details  Assessment & Plan:   Principal Problem:   Atrial fibrillation with RVR ([place]) Active Problems:   B12 deficiency   Weakness   (HFpEF) heart failure with preserved ejection fraction (HCC)   Microcytic anemia   Fecal occult blood test positive  Generalized Weakness  Subacute to chronic Ddx is broad, but includes most likely related to anemia, afib with RVR, b12 deficiency.   Will treat RVR, appreciate cardiology assistance Follow anemia, transfuse for downtrending or symptomatic anemia Treat b12 deficiency. TSH was wnl on 12/15. With chronic cough, weight loss, poor appetite, will follow CT chest (no acute process) Will need PT/OT evals, will need additional workup and follow up of weakness outpatient    Atrial Fibrillation with RVR Amiodarone per cards Start metoprolol Xarelto on hold for now -> cards planning for eliquis at discharge most likely Heparin gtt for now while trending anemia Appreciate cardiology assistance This could be contributing to above   Microcytic Anemia  Iron Deficiency Anemia  Vitamin B12 Deficiency  Heme Positive Stool Unclear recent baseline, last normal Hb was 2021, but none since then Hb relatively stable  Has been taking xarelto consistently.  Had positive FOBT, but no bloody or black stool. Given need for anticoagulation with afib, positive FOBT, will ask GI to comment on potential  inpatient scope GI planning EGD/colonoscopy 12/20, appreciate assistance Tolerating heparin, continue for now B12 deficiency -> replace Iron def as well -> IV iron ordered Follow folate level wnl   Cognitive Deficit Intermittent confusion and memory issues over past year or so Does have B12 def, will treat this Needs additional follow up with neurology outpatient to work up dementia No imaging needed at this time   Back Pain Following inflammatory markers -> CRP wnl, sed rate mildly elevated when correcting for age No complaints at this time, will follow Follow outpatient    Hypokalemia Replace and follow  Follow mag   History of Systolic Heart Failure Recent echo with recovered EF Mildly elevated BNP, not grossly overloaded on exam Hold lasix for now given concern for weakness, etc   Hypertension Follow orthostatics as BP on low side outpatient Hold spironolactone, metoprolol, losartan, diltiazem   HLD Statin on hold due to concern for muscle aches -> I think this can probably be resumed eventually as lower suspicion her statin causing this Follow CK - low   Obesity Body mass index is 38.74 kg/m.      DVT prophylaxis: heparin gtt Code Status: full Family Communication: none at bedside Disposition:   Status is: Inpatient Remains inpatient appropriate because: none   Consultants:  GI cardiology  Procedures:  none  Antimicrobials:  Anti-infectives (From admission, onward)    None       Subjective: No complaints  Objective: Vitals:   05/28/22 1242 05/28/22 1554 05/28/22 1700 05/28/22 1957  BP: 128/76 (!) 127/55 137/80 107/66  Pulse: (!) 116 (!) 106  (!) 102  Resp: _0 Temp: 97.8 F (36.6 C) 97.9 F (36.6 C)  98.6 F (37 C)  TempSrc: Oral Oral  Oral  SpO2: 98% 98%  96%  Weight:      Height:        Intake/Output Summary (Last 24 hours) at 05/28/2022 2028 Last data filed at 05/28/2022 1759 Gross per 24 hour  Intake --  Output  350 ml  Net -350 ml   Filed Weights   05/27/22 1121  Weight: 93 kg    Examination:  General: No acute distress. Cardiovascular: irregularly irregular, tachy Lungs: Clear to auscultation bilaterally  Abdomen: Soft, nontender, nondistended  Neurological: Alert and oriented 3. Moves all extremities 4 with equal strength. Cranial nerves II through XII grossly intact. Extremities: No clubbing or cyanosis. No edema  Data Reviewed: I have personally reviewed following labs and imaging studies  CBC: Recent Labs  Lab 05/24/22 1155 05/27/22 1143 05/28/22 0120 05/28/22 0918 05/28/22 1944  WBC 9.3 8.5 7.6  --   --   NEUTROABS 6.9 5.9  --   --   --   HGB 9.7* 9.0* 8.0* 9.0* 9.3*  HCT 31.2* 31.2* 27.2* 30.9* 30.6*  MCV 76.0* 79.8* 79.1*  --   --   PLT 467.0* 467* 403*  --   --     Basic Metabolic Panel: Recent Labs  Lab 05/24/22 1155 05/27/22 1143 05/28/22 0120  NA 136 136 136  K 3.8 3.0* 4.4  CL 98 100 105  CO2 28 23 21*  GLUCOSE 107* 105* 98  BUN _1 CREATININE 1.14 1.22* 1.13*  CALCIUM 8.7 9.6 8.7*  MG  --   --  2.0    GFR: Estimated Creatinine Clearance: 44.1 mL/min (Jujuan Dugo) (by C-G formula based on SCr of 1.13 mg/dL (H)).  Liver Function Tests: Recent Labs  Lab 05/24/22 1155 05/27/22 1143 05/28/22 0120  AST _2 ALT _3 ALKPHOS 94 84 76  BILITOT 0.7 0.7 0.6  PROT 6.4 6.5 5.6*  ALBUMIN 3.6 2.9* 2.6*    CBG: No results for input(s): "GLUCAP" in the last 168 hours.   Recent Results (from the past 240 hour(s))  Resp panel by RT-PCR (RSV, Flu Bernell Haynie&B, Covid) Anterior Nasal Swab     Status: None   Collection Time: 05/27/22 11:15 AM   Specimen: Anterior Nasal Swab  Result Value Ref Range Status   SARS Coronavirus 2 by RT PCR NEGATIVE NEGATIVE Final    Comment: (NOTE) SARS-CoV-2 target nucleic acids are NOT DETECTED.  The SARS-CoV-2 RNA is generally detectable in upper respiratory specimens during the acute phase of infection. The  lowest concentration of SARS-CoV-2 viral copies this assay can detect is 138 copies/mL. Sharel Behne negative result does not preclude SARS-Cov-2 infection and should not be used as the sole basis for treatment or other patient management decisions. Sharni Negron negative result may occur with  improper specimen collection/handling, submission of specimen other than nasopharyngeal swab, presence of viral mutation(s) within the areas targeted by this assay, and inadequate number of viral copies(<138 copies/mL). Estephani Popper negative result must be combined with clinical observations, patient history, and epidemiological information. The expected result is Negative.  Fact Sheet for Patients:  EntrepreneurPulse.com.au  Fact Sheet for Healthcare Providers:  IncredibleEmployment.be  This test is no t yet approved or cleared by the [place] FDA and  has been authorized for detection and/or diagnosis of SARS-CoV-2 by FDA under an Emergency Use Authorization (EUA). This EUA  will remain  in effect (meaning this test can be used) for the duration of the COVID-19 declaration under Section 564(b)(1) of the Act, 21 U.S.C.section 360bbb-3(b)(1), unless the authorization is terminated  or revoked sooner.       Influenza Glady Ouderkirk by PCR NEGATIVE NEGATIVE Final   Influenza B by PCR NEGATIVE NEGATIVE Final    Comment: (NOTE) The Xpert Xpress SARS-CoV-2/FLU/RSV plus assay is intended as an aid in the diagnosis of influenza from Nasopharyngeal swab specimens and should not be used as Maday Guarino sole basis for treatment. Nasal washings and aspirates are unacceptable for Xpert Xpress SARS-CoV-2/FLU/RSV testing.  Fact Sheet for Patients: EntrepreneurPulse.com.au  Fact Sheet for Healthcare Providers: IncredibleEmployment.be  This test is not yet approved or cleared by the [place] FDA and has been authorized for detection and/or diagnosis of SARS-CoV-2 by FDA under  an Emergency Use Authorization (EUA). This EUA will remain in effect (meaning this test can be used) for the duration of the COVID-19 declaration under Section 564(b)(1) of the Act, 21 U.S.C. section 360bbb-3(b)(1), unless the authorization is terminated or revoked.     Resp Syncytial Virus by PCR NEGATIVE NEGATIVE Final    Comment: (NOTE) Fact Sheet for Patients: EntrepreneurPulse.com.au  Fact Sheet for Healthcare Providers: IncredibleEmployment.be  This test is not yet approved or cleared by the [place] FDA and has been authorized for detection and/or diagnosis of SARS-CoV-2 by FDA under an Emergency Use Authorization (EUA). This EUA will remain in effect (meaning this test can be used) for the duration of the COVID-19 declaration under Section 564(b)(1) of the Act, 21 U.S.C. section 360bbb-3(b)(1), unless the authorization is terminated or revoked.  Performed at [facility], [place] [address], [place], [place] [address]          Radiology Studies: CT CHEST WO CONTRAST  Result Date: 05/27/2022 CLINICAL DATA:  Chronic/persisting cough. EXAM: CT CHEST WITHOUT CONTRAST TECHNIQUE: Multidetector CT imaging of the chest was performed following the standard protocol without IV contrast. RADIATION DOSE REDUCTION: This exam was performed according to the departmental dose-optimization program which includes automated exposure control, adjustment of the mA and/or kV according to patient size and/or use of iterative reconstruction technique. COMPARISON:  None Available. FINDINGS: Cardiovascular: The heart is enlarged and there is no pericardial effusion. Three-vessel coronary artery calcifications are noted. There is atherosclerotic calcification of the aorta without evidence of aneurysm. The pulmonary trunk is normal in caliber. Mediastinum/Nodes: No mediastinal or axillary lymphadenopathy. Evaluation of the hila is limited due to lack of IV  contrast. The trachea and esophagus are within normal limits. There is Lottie Sigman 1.2 cm nodule in the thyroid isthmus. There is Mackey Varricchio small hiatal hernia. Lungs/Pleura: Mild apical pleural scarring is present bilaterally. Mild atelectasis or scarring is noted bilaterally. No consolidation, effusion, or pneumothorax. Upper Abdomen: No acute abnormality. Musculoskeletal: Kaleah Hagemeister left breast implant is noted. Degenerative changes are present in the thoracic spine. IMPRESSION: 1. No acute process. 2. Small hiatal hernia. 3. Aortic atherosclerosis and coronary artery calcifications. Electronically Signed   By: Brett Fairy M.D.   On: 05/27/2022 21:45   DG Chest 1 View  Result Date: 05/27/2022 CLINICAL DATA:  Malaise symptoms for 1 week. EXAM: CHEST  1 VIEW COMPARISON:  None Available. FINDINGS: The heart size and mediastinal contours are within normal limits. Both lungs are clear. The visualized skeletal structures are unremarkable. IMPRESSION: No active disease. Electronically Signed   By: Nolon Nations M.D.   On: 05/27/2022 11:50  Scheduled Meds:  cyanocobalamin  1,000 mcg Intramuscular Daily   metoCLOPramide (REGLAN) injection  10 mg Intravenous Q6H   metoprolol tartrate  12.5 mg Oral BID   peg 3350 powder  0.5 kit Oral Once   Continuous Infusions:  amiodarone 30 mg/hr (05/28/22 2007)   heparin 1,200 Units/hr (05/28/22 2006)     LOS: 1 day    Time spent: over 30 min    Fayrene Helper, MD [facility]   To contact the attending provider between 7A-7P or the covering provider during after hours 7P-7A, please log into the web site [email or web address] and access using universal  password for that web site. If you do not have the password, please call the hospital operator.  05/28/2022, 8:28 PM

## 2022-05-28 NOTE — Care Management (Signed)
  Transition of Care [facility]) Screening Note   Patient Details  Name: Natasha Dickson Date of Birth: 08/28/1945   [facility]) CM/SW Contact:    Bethena Roys, RN Phone Number: 05/28/2022, 3:37 PM    Transition of Care Department [facility]) has reviewed the patient and no TOC needs have been identified at this time. Per notes, plan for colonoscopy 05-29-22. We will continue to monitor patient advancement through interdisciplinary progression rounds. If new patient transition needs arise, please place a TOC consult.

## 2022-05-28 NOTE — Progress Notes (Signed)
[place] for heparin  Indication: atrial fibrillation Brief A/P: Heparin level within goal range Continue Heparin at current rate   No Known Allergies  Patient Measurements: Height: '5\' 1"'$  (154.9 cm) Weight: 93 kg (205 lb 0.4 oz) IBW/kg (Calculated) : 47.8 Heparin Dosing Weight: 69.7kg   Vital Signs: Temp: 98.2 F (36.8 C) (12/18 2344) Temp Source: Oral (12/18 2344) BP: 115/55 (12/19 0145) Pulse Rate: 98 (12/19 0145)  Labs: Recent Labs    05/27/22 1143 05/27/22 1809 05/28/22 0120  HGB 9.0*  --  8.0*  HCT 31.2*  --  27.2*  PLT 467*  --  403*  HEPARINUNFRC  --   --  0.45  CREATININE 1.22*  --   --   TROPONINIHS 5 5  --      Estimated Creatinine Clearance: 40.8 mL/min (A) (by C-G formula based on SCr of 1.22 mg/dL (H)).  Assessment: 76 y.o. female with Afib for heparin  Goal of Therapy:  Heparin level 0.3-0.7 units/ml Monitor platelets by anticoagulation protocol: Yes   Plan:  Continue Heparin at current rate   Phillis Knack, PharmD, BCPS  05/28/2022,1:56 AM

## 2022-05-28 NOTE — Progress Notes (Signed)
Rounding Note    Patient Name: Natasha Dickson Date of Encounter: 05/29/2022  [facility] Cardiologist: Freada Bergeron, MD   Subjective   Feels okay this morning. No chest pain or SOB. Planned for EGD/colo today.  Remains in Afib with HR 110-120s Hemoglobin stable 8.5  Inpatient Medications    Scheduled Meds:  cyanocobalamin  1,000 mcg Intramuscular Daily   ferrous sulfate  325 mg Oral QODAY   folic acid  1 mg Oral Daily   metoprolol tartrate  12.5 mg Oral BID   Continuous Infusions:  amiodarone 30 mg/hr (05/29/22 0822)   ferric gluconate (FERRLECIT) IVPB 250 mg (05/28/22 2220)   PRN Meds:    Vital Signs    Vitals:   05/29/22 0345 05/29/22 0630 05/29/22 0734 05/29/22 0823  BP:  115/65 108/72 101/67  Pulse:  (!) 103 (!) 107 (!) 118  Resp:  '18 18 18  '$ Temp: 98.4 F (36.9 C) 98.1 F (36.7 C) (!) 97.1 F (36.2 C) 98 F (36.7 C)  TempSrc: Oral Oral Temporal   SpO2:  99% 100% 100%  Weight:      Height:        Intake/Output Summary (Last 24 hours) at 05/29/2022 0847 Last data filed at 05/29/2022 0000 Gross per 24 hour  Intake --  Output 600 ml  Net -600 ml      05/27/2022   11:21 AM 05/24/2022   11:26 AM 05/17/2022   12:38 PM  Last 3 Weights  Weight (lbs) 205 lb 0.4 oz 205 lb 210 lb  Weight (kg) 93 kg 92.987 kg 95.255 kg      Telemetry    Afib with RVR with HR 110-130s - Personally Reviewed  ECG    No new tracing- Personally Reviewed  Physical Exam   GEN: No acute distress.  Laying in bed Neck: No JVD Cardiac: Tachycardic, irregular, no murmurs Respiratory: Clear to auscultation bilaterally. GI: Soft, nontender, non-distended  MS: No edema, warm Neuro:  Nonfocal  Psych: Normal affect   Labs    High Sensitivity Troponin:   Recent Labs  Lab 05/27/22 1143 05/27/22 1809  TROPONINIHS 5 5     Chemistry Recent Labs  Lab 05/24/22 1155 05/27/22 1143 05/28/22 0120  NA 136 136 136  K 3.8 3.0* 4.4  CL 98 100 105  CO2  28 23 21*  GLUCOSE 107* 105* 98  BUN '20 15 13  '$ CREATININE 1.14 1.22* 1.13*  CALCIUM 8.7 9.6 8.7*  MG  --   --  2.0  PROT 6.4 6.5 5.6*  ALBUMIN 3.6 2.9* 2.6*  AST '10 16 17  '$ ALT '5 10 7  '$ ALKPHOS 94 84 76  BILITOT 0.7 0.7 0.6  GFRNONAA  --  46* 50*  ANIONGAP  --  13 10    Lipids  Recent Labs  Lab 05/24/22 1155  CHOL 147  TRIG 98.0  HDL 41.50  LDLCALC 86  CHOLHDL 4    Hematology Recent Labs  Lab 05/27/22 1143 05/28/22 0120 05/28/22 0918 05/28/22 1944 05/29/22 0144  WBC 8.5 7.6  --   --  10.9*  RBC 3.91 3.44*  --   --  3.67*  HGB 9.0* 8.0* 9.0* 9.3* 8.5*  HCT 31.2* 27.2* 30.9* 30.6* 28.9*  MCV 79.8* 79.1*  --   --  78.7*  MCH 23.0* 23.3*  --   --  23.2*  MCHC 28.8* 29.4*  --   --  29.4*  RDW 16.5* 16.7*  --   --  16.7*  PLT 467* 403*  --   --  451*   Thyroid  Recent Labs  Lab 05/24/22 1155  TSH 3.44    BNP Recent Labs  Lab 05/24/22 1155 05/27/22 1809  BNP  --  225.7*  PROBNP 203.0*  --     DDimer No results for input(s): "DDIMER" in the last 168 hours.   Radiology    CT CHEST WO CONTRAST  Result Date: 05/27/2022 CLINICAL DATA:  Chronic/persisting cough. EXAM: CT CHEST WITHOUT CONTRAST TECHNIQUE: Multidetector CT imaging of the chest was performed following the standard protocol without IV contrast. RADIATION DOSE REDUCTION: This exam was performed according to the departmental dose-optimization program which includes automated exposure control, adjustment of the mA and/or kV according to patient size and/or use of iterative reconstruction technique. COMPARISON:  None Available. FINDINGS: Cardiovascular: The heart is enlarged and there is no pericardial effusion. Three-vessel coronary artery calcifications are noted. There is atherosclerotic calcification of the aorta without evidence of aneurysm. The pulmonary trunk is normal in caliber. Mediastinum/Nodes: No mediastinal or axillary lymphadenopathy. Evaluation of the hila is limited due to lack of IV  contrast. The trachea and esophagus are within normal limits. There is a 1.2 cm nodule in the thyroid isthmus. There is a small hiatal hernia. Lungs/Pleura: Mild apical pleural scarring is present bilaterally. Mild atelectasis or scarring is noted bilaterally. No consolidation, effusion, or pneumothorax. Upper Abdomen: No acute abnormality. Musculoskeletal: A left breast implant is noted. Degenerative changes are present in the thoracic spine. IMPRESSION: 1. No acute process. 2. Small hiatal hernia. 3. Aortic atherosclerosis and coronary artery calcifications. Electronically Signed   By: Brett Fairy M.D.   On: 05/27/2022 21:45   DG Chest 1 View  Result Date: 05/27/2022 CLINICAL DATA:  Malaise symptoms for 1 week. EXAM: CHEST  1 VIEW COMPARISON:  None Available. FINDINGS: The heart size and mediastinal contours are within normal limits. Both lungs are clear. The visualized skeletal structures are unremarkable. IMPRESSION: No active disease. Electronically Signed   By: Nolon Nations M.D.   On: 05/27/2022 11:50    Cardiac Studies   TTE 04/16/22: IMPRESSIONS     1. Left ventricular ejection fraction, by estimation, is 55 to 60%. The  left ventricle has normal function. The left ventricle has no regional  wall motion abnormalities. Left ventricular diastolic parameters are  indeterminate.   2. Right ventricular systolic function is normal. The right ventricular  size is normal. There is normal pulmonary artery systolic pressure.   3. Left atrial size was mildly dilated.   4. The mitral valve is normal in structure. Mild mitral valve  regurgitation. No evidence of mitral stenosis.   5. Tricuspid valve regurgitation is mild to moderate.   6. The aortic valve is tricuspid. Aortic valve regurgitation is not  visualized. Aortic valve sclerosis is present, with no evidence of aortic  valve stenosis.   7. The inferior vena cava is normal in size with greater than 50%  respiratory variability,  suggesting right atrial pressure of 3 mmHg.   Patient Profile     76 y.o. female  with a hx of combined systolic and diastolic HF thought to be tachycardia induced CM in the setting of Aib with RVR, persistent Afib on rate control strategy, HTN, HLD and CKD who is being seen 05/27/2022 for the evaluation of Afib at the request of Dr. Florene Glen.   Assessment & Plan    #Persistent Afib: CHADs-vasc 5. Has known chronic Afib for which she  has been maintained on rate control strategy. Presented on this admission with increasing weakness and SOB found to be in Afib with RVR with HR 110-130s. BP soft on admission and could not tolerate nodal agents and therefor amiodarone was started. She was also anemic with positive stool guaiac but no melena or BRBPR. Xarelto was held and heparin was started.  -Continue amiodarone gtt -Will dig load for additional rate control -Increase metop to '25mg'$  BID given improvement in blood pressure -Continue heparin gtt for now -Likely plan for apixaban over xarelto on discharge   #Microcytic Anemia: #Low B12: Unclear duration. HgB 9 on admission and was previously 13 in 2021. Fecal occult positive but no melena or BRBPR. Plan for EGD/colo with GI for further work-up. -Plan for EGD/Colo; okay for procedure from CV perspective -Continue close trending of hemoglobin while on heparin   #Generalized weakness/fatigue: Possibly related to underlying anemia that acutely worsened in the setting of Afib with RVR. Concern she has had an overall general decline as well. Will manage Afib and anemia as above. PT/OT also consulted   #Chronic Combined Systolic and Diastolic HF with Improved EF: EF dropped to 30-35% 2014  thought to be secondary to tachy induced CM in the setting of Afib with RVR. Improved back to 55-60% in 04/2022. Currently euvolemic on exam. Holding GDMT due to soft blood pressures. -Continue metop '25mg'$  BID -Will add back GDMT as able pending blood  pressures -Monitor I/Os and daily weights -Low Na diet   #HTN: Now with low blood pressure.  -Holding home meds in the setting of soft blood pressures   #CKD Stage II: Monitor.   #HLD: -Continue lipitor '10mg'$  daily      For questions or updates, please contact [place] Please consult [email or web address] for contact info under        Signed, Freada Bergeron, MD  05/29/2022, 8:47 AM

## 2022-05-29 ENCOUNTER — Encounter (HOSPITAL_COMMUNITY)
Admission: EM | Disposition: A | Discharge status: Skilled Nursing Facility | Payer: Self-pay | Source: Home / Self Care | Attending: Family Medicine

## 2022-05-29 ENCOUNTER — Inpatient Hospital Stay (HOSPITAL_COMMUNITY): Payer: Medicare Other | Admitting: Anesthesiology

## 2022-05-29 ENCOUNTER — Inpatient Hospital Stay (HOSPITAL_COMMUNITY): Payer: Medicare Other

## 2022-05-29 ENCOUNTER — Encounter (HOSPITAL_COMMUNITY): Payer: Self-pay | Admitting: Family Medicine

## 2022-05-29 DIAGNOSIS — K5730 Diverticulosis of large intestine without perforation or abscess without bleeding: Secondary | ICD-10-CM

## 2022-05-29 DIAGNOSIS — D124 Benign neoplasm of descending colon: Secondary | ICD-10-CM | POA: Diagnosis not present

## 2022-05-29 DIAGNOSIS — C180 Malignant neoplasm of cecum: Secondary | ICD-10-CM | POA: Diagnosis not present

## 2022-05-29 DIAGNOSIS — I509 Heart failure, unspecified: Secondary | ICD-10-CM

## 2022-05-29 DIAGNOSIS — R195 Other fecal abnormalities: Secondary | ICD-10-CM | POA: Diagnosis not present

## 2022-05-29 DIAGNOSIS — D649 Anemia, unspecified: Secondary | ICD-10-CM

## 2022-05-29 DIAGNOSIS — K449 Diaphragmatic hernia without obstruction or gangrene: Secondary | ICD-10-CM

## 2022-05-29 DIAGNOSIS — K635 Polyp of colon: Secondary | ICD-10-CM

## 2022-05-29 DIAGNOSIS — K317 Polyp of stomach and duodenum: Secondary | ICD-10-CM | POA: Diagnosis not present

## 2022-05-29 DIAGNOSIS — D125 Benign neoplasm of sigmoid colon: Secondary | ICD-10-CM

## 2022-05-29 DIAGNOSIS — I5032 Chronic diastolic (congestive) heart failure: Secondary | ICD-10-CM | POA: Diagnosis not present

## 2022-05-29 DIAGNOSIS — K6389 Other specified diseases of intestine: Secondary | ICD-10-CM

## 2022-05-29 DIAGNOSIS — I4891 Unspecified atrial fibrillation: Secondary | ICD-10-CM | POA: Diagnosis not present

## 2022-05-29 DIAGNOSIS — D509 Iron deficiency anemia, unspecified: Secondary | ICD-10-CM | POA: Diagnosis not present

## 2022-05-29 DIAGNOSIS — D123 Benign neoplasm of transverse colon: Secondary | ICD-10-CM | POA: Diagnosis not present

## 2022-05-29 DIAGNOSIS — E538 Deficiency of other specified B group vitamins: Secondary | ICD-10-CM | POA: Diagnosis not present

## 2022-05-29 DIAGNOSIS — K2970 Gastritis, unspecified, without bleeding: Secondary | ICD-10-CM | POA: Diagnosis not present

## 2022-05-29 DIAGNOSIS — D500 Iron deficiency anemia secondary to blood loss (chronic): Secondary | ICD-10-CM

## 2022-05-29 HISTORY — PX: BIOPSY: SHX5522

## 2022-05-29 HISTORY — PX: POLYPECTOMY: SHX5525

## 2022-05-29 HISTORY — PX: SUBMUCOSAL TATTOO INJECTION: SHX6856

## 2022-05-29 HISTORY — PX: COLONOSCOPY WITH PROPOFOL: SHX5780

## 2022-05-29 HISTORY — PX: ESOPHAGOGASTRODUODENOSCOPY (EGD) WITH PROPOFOL: SHX5813

## 2022-05-29 LAB — CBC
HCT: 28.9 % — ABNORMAL LOW (ref 36.0–46.0)
Hemoglobin: 8.5 g/dL — ABNORMAL LOW (ref 12.0–15.0)
MCH: 23.2 pg — ABNORMAL LOW (ref 26.0–34.0)
MCHC: 29.4 g/dL — ABNORMAL LOW (ref 30.0–36.0)
MCV: 78.7 fL — ABNORMAL LOW (ref 80.0–100.0)
Platelets: 451 10*3/uL — ABNORMAL HIGH (ref 150–400)
RBC: 3.67 MIL/uL — ABNORMAL LOW (ref 3.87–5.11)
RDW: 16.7 % — ABNORMAL HIGH (ref 11.5–15.5)
WBC: 10.9 10*3/uL — ABNORMAL HIGH (ref 4.0–10.5)
nRBC: 0 % (ref 0.0–0.2)

## 2022-05-29 LAB — HEPARIN LEVEL (UNFRACTIONATED)
Heparin Unfractionated: 1.02 IU/mL — ABNORMAL HIGH (ref 0.30–0.70)
Heparin Unfractionated: 0.24 IU/mL — ABNORMAL LOW (ref 0.30–0.70)

## 2022-05-29 LAB — APTT
aPTT: 172 seconds (ref 24–36)
aPTT: 68 seconds — ABNORMAL HIGH (ref 24–36)

## 2022-05-29 SURGERY — COLONOSCOPY WITH PROPOFOL
Anesthesia: Monitor Anesthesia Care

## 2022-05-29 MED ORDER — DIGOXIN 0.25 MG/ML IJ SOLN
0.5000 mg | Freq: Once | INTRAMUSCULAR | Status: AC
  Administered 2022-05-29: 0.5 mg via INTRAVENOUS
  Filled 2022-05-29: qty 2

## 2022-05-29 MED ORDER — HEPARIN (PORCINE) 25000 UT/250ML-% IV SOLN
1100.0000 [IU]/h | INTRAVENOUS | Status: AC
  Administered 2022-05-29 – 2022-05-30 (×2): 1100 [IU]/h via INTRAVENOUS
  Filled 2022-05-29: qty 250

## 2022-05-29 MED ORDER — PROPOFOL 10 MG/ML IV BOLUS
INTRAVENOUS | Status: DC | PRN
  Administered 2022-05-29 (×2): 20 mg via INTRAVENOUS

## 2022-05-29 MED ORDER — PROPOFOL 500 MG/50ML IV EMUL
INTRAVENOUS | Status: DC | PRN
  Administered 2022-05-29: 100 ug/kg/min via INTRAVENOUS

## 2022-05-29 MED ORDER — DIGOXIN 0.25 MG/ML IJ SOLN
0.2500 mg | Freq: Once | INTRAMUSCULAR | Status: AC
  Administered 2022-05-29: 0.25 mg via INTRAVENOUS
  Filled 2022-05-29: qty 1

## 2022-05-29 MED ORDER — METOPROLOL TARTRATE 25 MG PO TABS
25.0000 mg | ORAL_TABLET | Freq: Two times a day (BID) | ORAL | Status: DC
  Administered 2022-05-29: 25 mg via ORAL
  Filled 2022-05-29 (×2): qty 1

## 2022-05-29 MED ORDER — IOHEXOL 350 MG/ML SOLN
100.0000 mL | Freq: Once | INTRAVENOUS | Status: AC | PRN
  Administered 2022-05-29: 100 mL via INTRAVENOUS

## 2022-05-29 MED ORDER — IOHEXOL 9 MG/ML PO SOLN
500.0000 mL | ORAL | Status: AC
  Administered 2022-05-29 (×2): 500 mL via ORAL

## 2022-05-29 MED ORDER — LACTATED RINGERS IV SOLN
INTRAVENOUS | Status: AC | PRN
  Administered 2022-05-29: 1000 mL via INTRAVENOUS

## 2022-05-29 SURGICAL SUPPLY — 25 items

## 2022-05-29 NOTE — Progress Notes (Signed)
Rounding Note    Patient Name: Natasha Dickson Date of Encounter: 05/30/2022  [facility] Cardiologist: Freada Bergeron, MD   Subjective   EGD/Colo showed obstructing mass at the cecum now planned for surgery evaluation.  States she feels okay this morning. No chest pain or SOB. States she wants surgery as she just wants to start to feel better.  Inpatient Medications    Scheduled Meds:  cyanocobalamin  1,000 mcg Intramuscular Daily   ferrous sulfate  325 mg Oral QODAY   folic acid  1 mg Oral Daily   metoprolol tartrate  12.5 mg Oral BID   Continuous Infusions:  amiodarone 30 mg/hr (05/30/22 0853)   ferric gluconate (FERRLECIT) IVPB 250 mg (05/28/22 2220)   heparin 1,100 Units/hr (05/30/22 0236)   PRN Meds:    Vital Signs    Vitals:   05/29/22 2137 05/29/22 2223 05/30/22 0622 05/30/22 0846  BP: (!) 90/56 (!) 96/50 (!) 104/45 (!) 106/52  Pulse:   94 90  Resp:   18   Temp:   99.1 F (37.3 C)   TempSrc:   Axillary   SpO2:   94%   Weight:      Height:        Intake/Output Summary (Last 24 hours) at 05/30/2022 0932 Last data filed at 05/29/2022 2200 Gross per 24 hour  Intake 1258.19 ml  Output --  Net 1258.19 ml      05/27/2022   11:21 AM 05/24/2022   11:26 AM 05/17/2022   12:38 PM  Last 3 Weights  Weight (lbs) 205 lb 0.4 oz 205 lb 210 lb  Weight (kg) 93 kg 92.987 kg 95.255 kg      Telemetry    Afib with HR 60-90s - Personally Reviewed  ECG    No new tracing today- Personally Reviewed  Physical Exam   GEN: No acute distress.  Laying in bed comfortable Neck: No JVD Cardiac: Irregularly irregular, no murmurs Respiratory: Clear to auscultation bilaterally. GI: Soft, nontender, non-distended  MS: No edema, warm Neuro:  Nonfocal  Psych: Normal affect   Labs    High Sensitivity Troponin:   Recent Labs  Lab 05/27/22 1143 05/27/22 1809  TROPONINIHS 5 5     Chemistry Recent Labs  Lab 05/24/22 1155 05/27/22 1143  05/28/22 0120 05/30/22 0227  NA 136 136 136 134*  K 3.8 3.0* 4.4 4.2  CL 98 100 105 107  CO2 28 23 21* 19*  GLUCOSE 107* 105* 98 79  BUN '20 15 13 '$ 7*  CREATININE 1.14 1.22* 1.13* 1.10*  CALCIUM 8.7 9.6 8.7* 8.5*  MG  --   --  2.0  --   PROT 6.4 6.5 5.6*  --   ALBUMIN 3.6 2.9* 2.6*  --   AST '10 16 17  '$ --   ALT '5 10 7  '$ --   ALKPHOS 94 84 76  --   BILITOT 0.7 0.7 0.6  --   GFRNONAA  --  46* 50* 52*  ANIONGAP  --  '13 10 8    '$ Lipids  Recent Labs  Lab 05/24/22 1155  CHOL 147  TRIG 98.0  HDL 41.50  LDLCALC 86  CHOLHDL 4    Hematology Recent Labs  Lab 05/28/22 0120 05/28/22 0918 05/28/22 1944 05/29/22 0144 05/30/22 0227  WBC 7.6  --   --  10.9* 10.1  RBC 3.44*  --   --  3.67* 3.57*  HGB 8.0*   < > 9.3* 8.5* 8.2*  HCT 27.2*   < > 30.6* 28.9* 27.8*  MCV 79.1*  --   --  78.7* 77.9*  MCH 23.3*  --   --  23.2* 23.0*  MCHC 29.4*  --   --  29.4* 29.5*  RDW 16.7*  --   --  16.7* 16.8*  PLT 403*  --   --  451* 425*   < > = values in this interval not displayed.   Thyroid  Recent Labs  Lab 05/24/22 1155  TSH 3.44    BNP Recent Labs  Lab 05/24/22 1155 05/27/22 1809  BNP  --  225.7*  PROBNP 203.0*  --     DDimer No results for input(s): "DDIMER" in the last 168 hours.   Radiology    CT ABDOMEN PELVIS W CONTRAST  Result Date: 05/29/2022 CLINICAL DATA:  Colon carcinoma staging EXAM: CT ABDOMEN AND PELVIS WITH CONTRAST TECHNIQUE: Multidetector CT imaging of the abdomen and pelvis was performed using the standard protocol following bolus administration of intravenous contrast. RADIATION DOSE REDUCTION: This exam was performed according to the departmental dose-optimization program which includes automated exposure control, adjustment of the mA and/or kV according to patient size and/or use of iterative reconstruction technique. CONTRAST:  13m OMNIPAQUE IOHEXOL 350 MG/ML SOLN COMPARISON:  None Available. FINDINGS: Lower chest: Heart is enlarged in size. Scattered  coronary artery calcifications are seen. There are small linear densities in the lower lung fields suggesting minimal scarring or subsegmental atelectasis. There is no focal consolidation in the lower lung fields. There is reconstruction prosthesis in left breast. Hepatobiliary: No focal abnormalities are seen in liver. There is no dilation of bile ducts. There are large gallbladder stones. There is no wall thickening in gallbladder. There is no fluid around the gallbladder. Pancreas: No focal abnormalities are seen. Spleen: Unremarkable. Adrenals/Urinary Tract: There is mild hyperplasia of adrenals without discrete nodules. There is no hydronephrosis. There is 5 mm calculus in the lower pole right kidney. Ureters are not dilated. Urinary bladder is unremarkable. Stomach/Bowel: Small hiatal hernia is seen. Stomach is not distended. There is no significant small bowel dilation. Appendix is not seen. There is 4.5 x 2.6 cm asymmetric mass lesion in the cecum close to the ileocolic junction. This lesion has to be considered a malignant neoplasm. There is mild asymmetric wall thickening in hepatic flexure measuring 2.2 cm in length and 6 mm in thickness. Scattered diverticula are seen in colon without signs of focal acute diverticulitis. There is no pericolic stranding. Vascular/Lymphatic: Scattered arterial calcifications are seen in aorta and its major branches. There are few subcentimeter mesenteric nodes. In view of the mass seen in cecum, follow-up PET-CT may be considered for further characterization of the slightly enlarged mesenteric lymph nodes. Reproductive: Unremarkable. Other: There is no ascites or pneumoperitoneum. Umbilical hernia containing fat is seen. Maximum diameter of hernial sac measures 5.8 cm. Musculoskeletal: There is 50% decrease in height of body of L1 vertebra, most likely old fracture. There is mild retropulsion in the upper endplate of body of L1 vertebra. There is 30% decrease in height of  upper endplate of body of L3 vertebra. This may be recent or old. There is previous right hip arthroplasty. IMPRESSION: There is 4.5 x 2.6 cm asymmetric wall thickening in cecum close to the ileocecal junction. This lesion has to be considered a malignant neoplasm. There are few subcentimeter mesenteric lymph nodes which may suggest incidental benign reactive hyperplasia. In view of the mass lesion in the cecum, PET-CT may be  considered for further characterization of mesenteric nodes. There is small focus of asymmetric wall thickening in hepatic flexure which may be an artifact due to incomplete distention or suggest neoplastic process. Please correlate with endoscopic findings. No focal abnormalities are seen in liver. 50% decrease in height of body of L1 vertebra most likely is old compression fracture. There is 30% decrease in height of upper endplate of body of L3 vertebra which may be recent or old fracture. If there are focal symptoms, follow-up MRI may be considered. There is no evidence of intestinal obstruction or pneumoperitoneum. There is no hydronephrosis. Large gallbladder stones without signs of acute cholecystitis or dilation of bile ducts. Small hiatal hernia. Diverticulosis of colon. Nonobstructing right renal calculus. There is moderate sized umbilical hernia containing fat. Aortic arteriosclerosis. Coronary artery calcifications are seen. Other findings as described in the body of the report. Electronically Signed   By: Elmer Picker M.D.   On: 05/29/2022 20:08    Cardiac Studies   TTE 04/16/22: IMPRESSIONS     1. Left ventricular ejection fraction, by estimation, is 55 to 60%. The  left ventricle has normal function. The left ventricle has no regional  wall motion abnormalities. Left ventricular diastolic parameters are  indeterminate.   2. Right ventricular systolic function is normal. The right ventricular  size is normal. There is normal pulmonary artery systolic pressure.    3. Left atrial size was mildly dilated.   4. The mitral valve is normal in structure. Mild mitral valve  regurgitation. No evidence of mitral stenosis.   5. Tricuspid valve regurgitation is mild to moderate.   6. The aortic valve is tricuspid. Aortic valve regurgitation is not  visualized. Aortic valve sclerosis is present, with no evidence of aortic  valve stenosis.   7. The inferior vena cava is normal in size with greater than 50%  respiratory variability, suggesting right atrial pressure of 3 mmHg.   Patient Profile     76 y.o. female  with a hx of combined systolic and diastolic HF thought to be tachycardia induced CM in the setting of Aib with RVR, persistent Afib on rate control strategy, HTN, HLD and CKD who presented to the ER with progressive weakness found to be anemic and in Afib with RVR for which Cardiology was consulted. Underwent EGD/Colo on 05/29/22 which revealed obstructing colonic mass.   Assessment & Plan    #Obstructing Mass at the Cecum: Noted on colonoscopy yesterday. Now awaiting surgical evaluation. Revised cardiac risk index 10.1% for major CV event. She is clinically compensated from a HF standpoint and has no known obstructive CAD. Would not pursue ischemic evaluation due to lack of symptoms and would not change management as patient needs definitive treatment of her colonic mass and is high risk for DAPT due to anemia.  -Management per surgery -No further CV work-up needed prior to OR; revised cardiac risk index 10.1%  #Persistent Afib: CHADs-vasc 5. Has known chronic Afib for which she has been maintained on rate control strategy. Presented on this admission with increasing weakness and SOB found to be in Afib with RVR with HR 110-130s. BP soft on admission and could not tolerate nodal agents and therefor amiodarone was started. She was also anemic with positive stool guaiac now found to have obstructing mass on colonoscopy. Xarelto was held and heparin was  started.  -HR much better controlled; will continue amiodarone gtt for today and transition to PO amiodarone tomorrow -Continue metop 12.'5mg'$  BID -Continue heparin gtt for  now -Likely plan for apixaban over xarelto on discharge   #Microcytic Anemia: #Low B12: HgB 9 on admission and was previously 13 in 2021. Fecal occult positive and found to have obstructing colonic mass on colonoscopy as above. Tolerating heparin as above. -Management of colonic mass per surgery -Tolerating heparin; will trend H/H closely   #Generalized weakness/fatigue: Likely related to anemia and underlying malignancy as above.   #Chronic Combined Systolic and Diastolic HF with Improved EF: EF dropped to 30-35% 2014  thought to be secondary to tachy induced CM in the setting of Afib with RVR. Improved back to 55-60% in 04/2022. Currently euvolemic on exam. Holding GDMT due to soft blood pressures. -Continue metop 12.'5mg'$  BID -Will add back GDMT as able pending blood pressures -Monitor I/Os and daily weights -Low Na diet   #HTN: Now with low blood pressure.  -Holding home meds in the setting of soft blood pressures   #CKD Stage II: Monitor.   #HLD: -Continue lipitor '10mg'$  daily      For questions or updates, please contact [place] Please consult [email or web address] for contact info under        Signed, Freada Bergeron, MD  05/30/2022, 9:32 AM

## 2022-05-29 NOTE — Progress Notes (Signed)
PT Cancellation Note  Patient Details Name: Natasha Dickson MRN: [id number] DOB: 1945-09-21   Cancelled Treatment:    Reason Eval/Treat Not Completed: (P) Patient at procedure or test/unavailable Pt off floor for colonoscopy. PT will follow back this afternoon for treatment as able.  Reggie Bise B. Migdalia Dk PT, DPT Acute Rehabilitation Services Please use secure chat or  Call Office [phone]    [place] 05/29/2022, 7:26 AM

## 2022-05-29 NOTE — Progress Notes (Addendum)
OT Cancellation Note  Patient Details Name: ZAMARIYAH FURUKAWA MRN: [id number] DOB: 05-29-1946   Cancelled Treatment:    Reason Eval/Treat Not Completed: Patient at procedure or test/ unavailable (colonoscopy - OT eval to f/u when available)  Addendum 1425: Pt politely declined, wanting to rest after a busy day, agreeable to participate tomorrow. OT to continue efforts.   Elliot Cousin 05/29/2022, 7:30 AM

## 2022-05-29 NOTE — Progress Notes (Addendum)
ANTICOAGULATION CONSULT NOTE  Pharmacy Consult for heparin  Indication: atrial fibrillation  No Known Allergies  Patient Measurements: Height: '5\' 1"'$  (154.9 cm) Weight: 93 kg (205 lb 0.4 oz) IBW/kg (Calculated) : 47.8 Heparin Dosing Weight: 69.7kg   Vital Signs: Temp: 97.2 F (36.2 C) (12/20 1250) Temp Source: Temporal (12/20 1102) BP: 111/65 (12/20 1310) Pulse Rate: 96 (12/20 1310)  Labs: Recent Labs    05/27/22 1143 05/27/22 1143 05/27/22 1809 05/28/22 0120 05/28/22 0425 05/28/22 0918 05/28/22 1225 05/28/22 1944 05/29/22 0144  HGB 9.0*  --   --  8.0*  --  9.0*  --  9.3* 8.5*  HCT 31.2*  --   --  27.2*  --  30.9*  --  30.6* 28.9*  PLT 467*  --   --  403*  --   --   --   --  451*  APTT  --   --   --   --   --   --  54*  --  172*  HEPARINUNFRC  --    < >  --  0.45 0.37  --  0.45  --  1.02*  CREATININE 1.22*  --   --  1.13*  --   --   --   --   --   CKTOTAL  --   --   --  34*  --   --   --   --   --   TROPONINIHS 5  --  5  --   --   --   --   --   --    < > = values in this interval not displayed.     Estimated Creatinine Clearance: 44.1 mL/min (A) (by C-G formula based on SCr of 1.13 mg/dL (H)).   Medical History: Past Medical History:  Diagnosis Date   Breast cancer ([place]) 1999   Stage I in left breast   Chronic combined systolic and diastolic CHF (congestive heart failure) ([place])    a. 02/2013 Echo: EF 30-35%, diff HK, biatrial enlargement. b. 2D echo 07/2014 showed mildly dilated LV, EF 45-50%, severely dilated left atrium and moderately dilated right atrium.   CKD (chronic kidney disease), stage II    Closed fracture of unspecified part of neck of femur 2011   Right.  Soreness when standing. (Dr. Durward Fortes)   Nonischemic cardiomyopathy [facility])    a. 02/2013 [facility] CL: No ischemia, prob attenuation vs scar, EF 40%-->Med Rx.   Persistent atrial fibrillation ([place])    a. 02/2013: s/p TEE and attempted cardioversion x 2-->failed-->rate controlled with bb/digoxin,  Xarelto initiated.    Assessment: 47 yoF admitted with AF RVR. Pt on Xarelto PTA for anticoagulation, on hold for IV heparin.  Heparin held this am for EGD/colonoscopy. Ok to resume postop per GI. aPTT and heparin level both elevated this am so will resume at reduced dose - appears they were correlating and we can check heparin levels alone but will verify on recheck.   Goal of Therapy:  Heparin level 0.3-0.7 units/ml aPTT 66-102 seconds Monitor platelets by anticoagulation protocol: Yes   Plan:  Resume heparin no bolus 1100 units/h Check heparin level and aPTT in 6h  Arrie Senate, PharmD, [place], [facility] Clinical Pharmacist Please check AMION for all [place] numbers 05/29/2022

## 2022-05-29 NOTE — Transfer of Care (Signed)
Immediate Anesthesia Transfer of Care Note  Patient: MANHA AMATO  Procedure(s) Performed: COLONOSCOPY WITH PROPOFOL ESOPHAGOGASTRODUODENOSCOPY (EGD) WITH PROPOFOL BIOPSY POLYPECTOMY SUBMUCOSAL TATTOO INJECTION  Patient Location: Endoscopy Unit  Anesthesia Type:MAC  Level of Consciousness: awake and alert   Airway & Oxygen Therapy: Patient connected to nasal cannula oxygen  Post-op Assessment: Report given to RN and Post -op Vital signs reviewed and stable  Post vital signs: Reviewed and stable  Last Vitals:  Vitals Value Taken Time  BP 91/67 05/29/22 1250  Temp 36.2 C 05/29/22 1250  Pulse 122 05/29/22 1253  Resp 28 05/29/22 1253  SpO2 85 % 05/29/22 1253  Vitals shown include unvalidated device data.  Last Pain:  Vitals:   05/29/22 1250  TempSrc:   PainSc: 0-No pain         Complications: No notable events documented.

## 2022-05-29 NOTE — Consult Note (Signed)
Natasha Dickson 25-Mar-1946  [id number].    Requesting MD: Dr. Vance Gather Chief Complaint/Reason for Consult: Obstructing colon mass  HPI: Natasha Dickson is a 76 y.o. female with a hx of A. Fib on Xarelto (last dose 12/18, currently on heparin gtt), CHF (30-35% 2014, now 55-60% in 04/2022), HTN, HLD, CKD2 who presented to the ED with hypotension. Hx provided by chart review and patient. Patient reports since October she has been having worsening weakness, fatigue, decreased appetite, and weight loss (at least 10lbs). Denies n/v or abdominal pain. Patient had low bp at home and came to the ED for evaluation where she was found to be in A. Fib with RVR. Cards was consulted and she was admitted by [facility]. Labs were significant for anemia. Prior to admission she was having [place], formed stools every other day without hematochezia or melena but did have heme positive guaiac on admission. She underwent workup by GI and was found to have completely obstructing tumor int the cecum and at the ileocecal valve, along with polyps of the transverse, descending, and sigmoid colon. We were asked to see. CEA pending. CT A/P pending. CT chest w/o evidence of mets. To note, patient was able to tolerate prep (Moviprep) before colonoscopy without vomitng and was having bm's.   Patient reports she lives with her husband. Sometimes stays with her daughter. She retired. Never smoker. Family hx of colon cancer - father. Prior abdominal surgeries include tubal ligation. She reports at baseline she normally walks without assistive device. Her fatigue/weakness and doe since October has worsened where she can only walk short distances (from her bed to the bathroom) without having to stop and break.   Colonoscopy 12/20 Report - Preparation of the colon was fair, but adequate for the purposes of this study. - Malignant completely obstructing tumor in the cecum and at the ileocecal valve. Biopsied. Tattooed. - Two 4 to 5 mm polyps in  the descending colon and in the transverse colon, removed with a cold snare. Resected and retrieved. - One 8 mm polyp in the sigmoid colon, removed with a cold snare. Resected and retrieved. - Diverticulosis in the sigmoid colon. - Non-bleeding internal hemorrhoids. - Stool in the sigmoid colon and in the descending colon.  EGD Report 12/20 - Normal esophagus. - 3 cm hiatal hernia. - Gastritis. Biopsied. - Normal mucosa was found in the gastric fundus and in the gastric body. Biopsied. - A few gastric polyps. - Normal examined duodenum. Biopsied.  ROS: ROS As above, see hpi  Family History  Problem Relation Age of Onset   Congenital heart disease Son         transposition of great vessels- corrected with  MI  as Dickson adult   COPD Mother        from long-term smoking   Colon cancer Father    Heart disease Father    Heart attack Father    Hypertension Father    Heart disease Brother    Stroke Neg Hx     Past Medical History:  Diagnosis Date   Breast cancer ([place]) 1999   Stage I in left breast   Chronic combined systolic and diastolic CHF (congestive heart failure) ([place])    a. 02/2013 Echo: EF 30-35%, diff HK, biatrial enlargement. b. 2D echo 07/2014 showed mildly dilated LV, EF 45-50%, severely dilated left atrium and moderately dilated right atrium.   CKD (chronic kidney disease), stage II    Closed fracture of unspecified  part of neck of femur 2011   Right.  Soreness when standing. (Dr. Durward Fortes)   Nonischemic cardiomyopathy [facility])    a. 02/2013 Lexi CL: No ischemia, prob attenuation vs scar, EF 40%-->Med Rx.   Persistent atrial fibrillation ([place])    a. 02/2013: s/p TEE and attempted cardioversion x 2-->failed-->rate controlled with bb/digoxin, Xarelto initiated.    Past Surgical History:  Procedure Laterality Date   CARDIOVERSION N/A 02/18/2013   Procedure: CARDIOVERSION;  Surgeon: Jolaine Artist, MD;  Location: [facility] ENDOSCOPY;  Service: Cardiovascular;  Laterality: N/A;    CARDIOVERSION N/A 02/22/2013   Procedure: CARDIOVERSION;  Surgeon: Fay Records, MD;  Location: [facility] ENDOSCOPY;  Service: Cardiovascular;  Laterality: N/A;   MASTECTOMY  1999   Left breast with saline implant.   PARTIAL HIP ARTHROPLASTY Right    TEE WITHOUT CARDIOVERSION N/A 02/18/2013   Procedure: TRANSESOPHAGEAL ECHOCARDIOGRAM (TEE);  Surgeon: Jolaine Artist, MD;  Location: [facility] ENDOSCOPY;  Service: Cardiovascular;  Laterality: N/A;   tubal ligation     TUBAL LIGATION      Social History:  reports that she has never smoked. She has never used smokeless tobacco. She reports current alcohol use. She reports that she does not use drugs.  Allergies: No Known Allergies  Medications Prior to Admission  Medication Sig Dispense Refill   cyanocobalamin (VITAMIN B12) 1000 MCG/ML injection 1000 mcg injected IM weekly x 4 doses then monthly thereafter.     cyclobenzaprine (FLEXERIL) 10 MG tablet Take 0.5-1 tablets (5-10 mg total) by mouth 3 (three) times daily as needed for muscle spasms (sedation caution). 30 tablet 1   diltiazem (CARDIZEM CD) 180 MG 24 hr capsule Take 1 capsule (180 mg total) by mouth daily. 90 capsule 3   furosemide (LASIX) 20 MG tablet Take 1 tablet (20 mg total) by mouth daily. 30 tablet    metoprolol succinate (TOPROL-XL) 100 MG 24 hr tablet TAKE 1 TABLET BY MOUTH ONCE DAILY WITH MEALS OR  IMMEDIATELY  AFTER  A  MEAL (Patient taking differently: Take 100 mg by mouth daily. TAKE 1 TABLET BY MOUTH ONCE DAILY WITH MEALS OR  IMMEDIATELY  AFTER  A  MEAL) 90 tablet 3   nitroGLYCERIN (NITROSTAT) 0.4 MG SL tablet DISSOLVE ONE TABLET UNDER THE TONGUE EVERY 5 MINUTES AS NEEDED FOR CHEST PAIN.  DO NOT EXCEED A TOTAL OF 3 DOSES IN 15 MINUTES (Patient taking differently: Place 0.4 mg under the tongue every 5 (five) minutes as needed for chest pain.) 75 tablet 0   rivaroxaban (XARELTO) 20 MG TABS tablet Take 1 tablet (20 mg total) by mouth daily. 90 tablet 1   atorvastatin (LIPITOR) 10 MG  tablet Take 1 tablet (10 mg total) by mouth daily. Held as of 05/17/22 90 tablet 3   losartan (COZAAR) 25 MG tablet Hold as of 05/24/22 (Patient taking differently: Take 25 mg by mouth daily. Hold as of 05/24/22) 45 tablet 3   spironolactone (ALDACTONE) 25 MG tablet Held as of 05/17/22 (Patient taking differently: Take 25 mg by mouth daily. Held as of 05/17/22) 90 tablet 3   traMADol (ULTRAM) 50 MG tablet Take 1 tablet (50 mg total) by mouth every 8 (eight) hours as needed. (Patient not taking: Reported on 05/28/2022) 30 tablet 1     Physical Exam: Blood pressure 111/65, pulse 96, temperature (!) 97.2 F (36.2 C), resp. rate 20, height '5\' 1"'$  (1.549 m), weight 93 kg, SpO2 96 %. General: pleasant, elderly female who is laying in bed in NAD  HEENT: head is normocephalic, atraumatic.  Sclera are noninjected.  PERRL.  Ears and nose without any masses or lesions.  Mouth is pink and moist. Dentition fair Heart: Irregular, irregular  Lungs: CTAB, no wheezes, rhonchi, or rales noted.  Respiratory effort nonlabored Abd:  Soft, ND, she reports very mild ttp of the RLQ. There is no rigidity or guarding. +BS. Suspect a small umbilical hernia.  MS: no BUE or BLE edema Skin: warm and dry  Psych: A&Ox4 with Dickson appropriate affect Neuro: cranial nerves grossly intact, able speech, thought process intact, answers questions appropriately, moves all extremities, gait not assessed   Results for orders placed or performed during the hospital encounter of 05/27/22 (from the past 48 hour(s))  POC occult blood, ED     Status: Abnormal   Collection Time: 05/27/22  2:02 PM  Result Value Ref Range   Fecal Occult Bld POSITIVE (A) NEGATIVE  Brain natriuretic peptide     Status: Abnormal   Collection Time: 05/27/22  6:09 PM  Result Value Ref Range   B Natriuretic Peptide 225.7 (H) 0.0 - 100.0 pg/mL    Comment: Performed at [facility] [address], [place], [place] [address]  Troponin I (High Sensitivity)      Status: None   Collection Time: 05/27/22  6:09 PM  Result Value Ref Range   Troponin I (High Sensitivity) 5 <18 ng/L    Comment: (NOTE) Elevated high sensitivity troponin I (hsTnI) values and significant  changes across serial measurements may suggest ACS but many other  chronic and acute conditions are known to elevate hsTnI results.  Refer to the "Links" section for chest pain algorithms and additional  guidance. Performed at [facility], [place] [address], [place], [place] [address]   Folate     Status: None   Collection Time: 05/27/22  6:09 PM  Result Value Ref Range   Folate 7.3 >5.9 ng/mL    Comment: Performed at [facility], [place] [address] [place], [place] [address]  Magnesium     Status: None   Collection Time: 05/28/22  1:20 AM  Result Value Ref Range   Magnesium 2.0 1.7 - 2.4 mg/dL    Comment: Performed at [facility] [address], [place], [place] [address]  Sedimentation rate     Status: Abnormal   Collection Time: 05/28/22  1:20 AM  Result Value Ref Range   Sed Rate 57 (H) 0 - 22 mm/hr    Comment: Performed at [facility] [address] [place], [place] [address]  CK     Status: Abnormal   Collection Time: 05/28/22  1:20 AM  Result Value Ref Range   Total CK 34 (L) 38 - 234 U/L    Comment: Performed at [facility], [place] [address], [place], [place] [address]  CBC     Status: Abnormal   Collection Time: 05/28/22  1:20 AM  Result Value Ref Range   WBC 7.6 4.0 - 10.5 K/uL   RBC 3.44 (L) 3.87 - 5.11 MIL/uL   Hemoglobin 8.0 (L) 12.0 - 15.0 g/dL   HCT 27.2 (L) 36.0 - 46.0 %   MCV 79.1 (L) 80.0 - 100.0 fL   MCH 23.3 (L) 26.0 - 34.0 pg   MCHC 29.4 (L) 30.0 - 36.0 g/dL   RDW 16.7 (H) 11.5 - 15.5 %   Platelets 403 (H) 150 - 400 K/uL   nRBC 0.0 0.0 - 0.2 %  Comment: Performed at [facility], [place] [address], [place], [place] [address]  Heparin level (unfractionated)     Status: None   Collection Time: 05/28/22  1:20 AM   Result Value Ref Range   Heparin Unfractionated 0.45 0.30 - 0.70 IU/mL    Comment: (NOTE) The clinical reportable range upper limit is being lowered to >1.10 to align with the FDA approved guidance for the current laboratory assay.  If heparin results are below expected values, and patient dosage has  been confirmed, suggest follow up testing of antithrombin III levels. Performed at [facility], [place] [address], [place], [place] [address]   Comprehensive metabolic panel     Status: Abnormal   Collection Time: 05/28/22  1:20 AM  Result Value Ref Range   Sodium 136 135 - 145 mmol/L   Potassium 4.4 3.5 - 5.1 mmol/L   Chloride 105 98 - 111 mmol/L   CO2 21 (L) 22 - 32 mmol/L   Glucose, Bld 98 70 - 99 mg/dL    Comment: Glucose reference range applies only to samples taken after fasting for at least 8 hours.   BUN 13 8 - 23 mg/dL   Creatinine, Ser 1.13 (H) 0.44 - 1.00 mg/dL   Calcium 8.7 (L) 8.9 - 10.3 mg/dL   Total Protein 5.6 (L) 6.5 - 8.1 g/dL   Albumin 2.6 (L) 3.5 - 5.0 g/dL   AST 17 15 - 41 U/L   ALT 7 0 - 44 U/L   Alkaline Phosphatase 76 38 - 126 U/L   Total Bilirubin 0.6 0.3 - 1.2 mg/dL   GFR, Estimated 50 (L) >60 mL/min    Comment: (NOTE) Calculated using the CKD-EPI Creatinine Equation (2021)    Anion gap 10 5 - 15    Comment: Performed at [facility], [place] [address], [place], [place] [address]  Heparin level (unfractionated)     Status: None   Collection Time: 05/28/22  4:25 AM  Result Value Ref Range   Heparin Unfractionated 0.37 0.30 - 0.70 IU/mL    Comment: (NOTE) The clinical reportable range upper limit is being lowered to >1.10 to align with the FDA approved guidance for the current laboratory assay.  If heparin results are below expected values, and patient dosage has  been confirmed, suggest follow up testing of antithrombin III levels. Performed at [facility], [place] [address], [place], [place] [address]   Hemoglobin and hematocrit,  blood     Status: Abnormal   Collection Time: 05/28/22  9:18 AM  Result Value Ref Range   Hemoglobin 9.0 (L) 12.0 - 15.0 g/dL   HCT 30.9 (L) 36.0 - 46.0 %    Comment: Performed at [facility], [place] [address] [place], [place] [address]  C-reactive protein     Status: None   Collection Time: 05/28/22  9:19 AM  Result Value Ref Range   CRP <0.5 <1.0 mg/dL    Comment: Performed at [facility] [address], [place], [place] [address]  Iron and TIBC     Status: Abnormal   Collection Time: 05/28/22  9:19 AM  Result Value Ref Range   Iron 15 (L) 28 - 170 ug/dL   TIBC 535 (H) 250 - 450 ug/dL   Saturation Ratios 3 (L) 10.4 - 31.8 %   UIBC 520 ug/dL    Comment: Performed at [facility], [place] [address] [place], [place] [address]  Ferritin  Status: Abnormal   Collection Time: 05/28/22  9:19 AM  Result Value Ref Range   Ferritin 7 (L) 11 - 307 ng/mL    Comment: Performed at [facility], [place] [address] [place], [place] [address]  APTT     Status: Abnormal   Collection Time: 05/28/22 12:25 PM  Result Value Ref Range   aPTT 54 (H) 24 - 36 seconds    Comment:        IF BASELINE aPTT IS ELEVATED, SUGGEST PATIENT RISK ASSESSMENT BE USED TO DETERMINE APPROPRIATE ANTICOAGULANT THERAPY. Performed at [facility], [place] [address] [place], [place] [address]   Heparin level (unfractionated)     Status: None   Collection Time: 05/28/22 12:25 PM  Result Value Ref Range   Heparin Unfractionated 0.45 0.30 - 0.70 IU/mL    Comment: (NOTE) The clinical reportable range upper limit is being lowered to >1.10 to align with the FDA approved guidance for the current laboratory assay.  If heparin results are below expected values, and patient dosage has  been confirmed, suggest follow up testing of antithrombin III levels. Performed at [facility], [place] [address] [place], [place] [address]   Hemoglobin and hematocrit, blood     Status: Abnormal    Collection Time: 05/28/22  7:44 PM  Result Value Ref Range   Hemoglobin 9.3 (L) 12.0 - 15.0 g/dL   HCT 30.6 (L) 36.0 - 46.0 %    Comment: Performed at [facility], [place] [address] [place], [place] [address]  Heparin level (unfractionated)     Status: Abnormal   Collection Time: 05/29/22  1:44 AM  Result Value Ref Range   Heparin Unfractionated 1.02 (H) 0.30 - 0.70 IU/mL    Comment: (NOTE) The clinical reportable range upper limit is being lowered to >1.10 to align with the FDA approved guidance for the current laboratory assay.  If heparin results are below expected values, and patient dosage has  been confirmed, suggest follow up testing of antithrombin III levels. Performed at [facility], [place] [address] [place], [place] [address]   CBC     Status: Abnormal   Collection Time: 05/29/22  1:44 AM  Result Value Ref Range   WBC 10.9 (H) 4.0 - 10.5 K/uL   RBC 3.67 (L) 3.87 - 5.11 MIL/uL   Hemoglobin 8.5 (L) 12.0 - 15.0 g/dL   HCT 28.9 (L) 36.0 - 46.0 %   MCV 78.7 (L) 80.0 - 100.0 fL   MCH 23.2 (L) 26.0 - 34.0 pg   MCHC 29.4 (L) 30.0 - 36.0 g/dL   RDW 16.7 (H) 11.5 - 15.5 %   Platelets 451 (H) 150 - 400 K/uL   nRBC 0.0 0.0 - 0.2 %    Comment: Performed at [facility], [place] [address], [place], [place] [address]  APTT     Status: Abnormal   Collection Time: 05/29/22  1:44 AM  Result Value Ref Range   aPTT 172 (HH) 24 - 36 seconds    Comment:        IF BASELINE aPTT IS ELEVATED, SUGGEST PATIENT RISK ASSESSMENT BE USED TO DETERMINE APPROPRIATE ANTICOAGULANT THERAPY. REPEATED TO VERIFY CRITICAL RESULT CALLED TO, READ BACK BY AND VERIFIED WITH: J. FLETCHER, RN AT 0500 ON 05/29/22 BY H. HOWARD. Performed at [facility], [place] [address], [place], [place] [address]    CT CHEST WO CONTRAST  Result Date: 05/27/2022 CLINICAL DATA:  Chronic/persisting  cough. EXAM: CT CHEST WITHOUT CONTRAST TECHNIQUE: Multidetector CT imaging of the chest was performed  following the standard protocol without IV contrast. RADIATION DOSE REDUCTION: This exam was performed according to the departmental dose-optimization program which includes automated exposure control, adjustment of the mA and/or kV according to patient size and/or use of iterative reconstruction technique. COMPARISON:  None Available. FINDINGS: Cardiovascular: The heart is enlarged and there is no pericardial effusion. Three-vessel coronary artery calcifications are noted. There is atherosclerotic calcification of the aorta without evidence of aneurysm. The pulmonary trunk is normal in caliber. Mediastinum/Nodes: No mediastinal or axillary lymphadenopathy. Evaluation of the hila is limited due to lack of IV contrast. The trachea and esophagus are within normal limits. There is a 1.2 cm nodule in the thyroid isthmus. There is a small hiatal hernia. Lungs/Pleura: Mild apical pleural scarring is present bilaterally. Mild atelectasis or scarring is noted bilaterally. No consolidation, effusion, or pneumothorax. Upper Abdomen: No acute abnormality. Musculoskeletal: A left breast implant is noted. Degenerative changes are present in the thoracic spine. IMPRESSION: 1. No acute process. 2. Small hiatal hernia. 3. Aortic atherosclerosis and coronary artery calcifications. Electronically Signed   By: Brett Fairy M.D.   On: 05/27/2022 21:45    Anti-infectives (From admission, onward)    None       Assessment/Plan Colon Mass Patient has been seen and examined. Vitals, labs, imaging, I/O, and notes reviewed. This is a 76 y.o. female who presented to the ED with fatigue, weakness, poor po intake, weight loss. She was found to be anemic with guaiac positive stools. Underwent Colonoscopy today that showed completely obstructing tumor in the cecum and at the ileocecal valve, along with polyps of the transverse, descending, and sigmoid colon. Further w/u including a CEA and CT A/P are pending. CT chest done earlier in  admission w/o evidence of mets.  Ideally would like to see path results from Polyps removed in the transverse, descending, and sigmoid colon (along with from the obstructing mass) to determine the extent of Colectomy the patient will need. She clinically does not appear completely obstructed at this time and her hgb is overall stable. Would allow cld but would not advance past this. Would ask Cardiology to see if they can optimize the patient and give her a better idea of her cardiac risk for surgery.  I asked the patient if there was any family she would like me to contact. She asked for me to contact her daughter Margaretha [place]. I tried to call her while I was in the patients room without answer. The patient was A&O x 4, answering questions appropriately and said she did not wish for me to contact her husband at this time.  I discussed with the patient that we will need to await pathology before knowing the extent of Colectomy that is needed. We discussed potential risks of surgery including but are not limited to anesthesia (MI, CVA, prolonged intubation, aspiration, death), pain, bleeding, infection, scarring, hernia, damage to surrounding structures (blood vessels/nerves/viscus/organs/ureter), ileus, leak from anastomosis, possible need for stoma/ileostomy and DVT/PE. We also discussed typical post-operative care including the possible need for rehab/snf if necessary. The patient did ask that when we are able to get in contact with her family that we review above with her/them again. We will follow with you.   FEN - Okay for CLD VTE - SCDs, heparin gtt ID - None  A. Fib on Xarelto (last dose 12/18, currently on heparin gtt) CHF (30-35% 2014, now 55-60% in  04/2022) HTN HLD CKD2   Alferd Apa, [facility] 05/29/2022, 1:27 PM Please see Amion for pager number during day hours 7:00am-4:30pm

## 2022-05-29 NOTE — Progress Notes (Signed)
Patient did well overnight without any acute events.  Tolerated bowel prep without issue.  Patient arrived in Endoscopy unit this morning for EGD/colonoscopy.  Unfortunately heparin GTT was never held.  Discussed with the patient, and will hold heparin GTT now and plan for EGD/colonoscopy in about 4 hours to allow for more therapeutic capabilities.  Will return patient to the floor in the meantime.  Heparin stopped in Endo, please do not restart when she returns to the floor.

## 2022-05-29 NOTE — Progress Notes (Signed)
[facility] PROGRESS NOTE  Natasha Dickson (DOB: 02-Jul-1945) HYH:[id number] PCP: Tonia Ghent, MD  Brief Narrative: Natasha Dickson is a 76 y.o. female with medical history significant of atrial fibrillation on xarelto, systolic HF (recovered EF), non ischemic cardiomyopathy, hypertension and multiple other medical issues here with progressive generalized weakness.  Found to have new anemia and + FOBT as well as AFib with RVR. She was admitted with cardiology consultations. GI took for EGD/colonoscopy 12/20 which showed a "malignant completely obstructing tumor in the cecum and at the ileocecal valve," as well as polyps in the transverse, descending, and sigmoid colon which were also sent to pathology. CT abd/pelvis and surgery consultation pending.    Subjective: No new complaints.   Objective: BP (!) 110/56   Pulse (!) 115   Temp (!) 97.2 F (36.2 C)   Resp 20   Ht '5\' 1"'$  (1.549 m)   Wt 93 kg   SpO2 96%   BMI 38.74 kg/m   Gen: Elderly female in no acute distress Pulm: Nonlabored, clear, mildly tachypneic  CV: Rapid irreg, no MRG, No pitting edema GI: Soft, NT, ND, +BS  Neuro: Alert and oriented. No new focal deficits. Ext: Warm, no deformities Skin: No rashes, lesions or ulcers on visualized skin   Assessment & Plan: Principal Problem:   Atrial fibrillation with RVR (HCC) Active Problems:   B12 deficiency   Weakness   (HFpEF) heart failure with preserved ejection fraction (HCC)   Microcytic anemia   Fecal occult blood test positive   Colonic mass   Iron deficiency anemia due to chronic blood loss   Diverticulosis of colon without hemorrhage   Adenomatous polyp of transverse colon   Adenomatous polyp of descending colon   Adenomatous polyp of sigmoid colon   Gastritis and gastroduodenitis   Hiatal hernia  Obstructing colon mass at cecum:  - CLD per surgery. Cardiology is following currently and can weigh in on cardiac risk stratification/optimization.  - CT  abd/pelvis staging scan ordered. No mets on CT chest earlier in hospitalization.  - Will check CEA with AM labs  Chronic combined HFrEF with recovered EF, HTN: Appears roughly euvolemic.  - Metoprolol per cardiology - Soft BP limiting other GDMT at this time. Holding home BP meds.   Persistent AFib with RVR:  - Ok to continue heparin per consultants at this time. CHA2DS2-VASc score is 5  - Increased metoprolol to '25mg'$  BID with increased BP room.  - Digoxin per cardiology.  - Continue amiodarone gtt.   Symptomatic iron deficiency anemia: Ferritin is 7, iron 15, 3% sat, TIBC up to 535. Suspect thrombocytosis is reactive to this. - IV iron. - Monitor CBC, transfuse prn  Vitamin B12 deficiency: Level is 166. - IM supplement for now.   HLD: CK low.  - Statin  Stage IIIa CKD: Based on available CrCl values.  - Avoid nephrotoxins as able. Needs contrast with staging scans. Holding lasix for now.  Obesity: Body mass index is 38.74 kg/m.   Hypokalemia: Resolved  [name], MD [facility] [email or web address] 05/29/2022, 6:24 PM

## 2022-05-29 NOTE — Telephone Encounter (Signed)
Please defer this request.  She has been admitted in the meantime.  Thanks.

## 2022-05-29 NOTE — Op Note (Signed)
[facility] Patient Name: Natasha Dickson Procedure Date : 05/29/2022 MRN: [id number] Attending MD:   , MD, [id number] Date of Birth: 12/24/1945 CSN: [id number] Age: 76 Admit Type: Outpatient Procedure:                Colonoscopy w/ biopsy, snare polypectomy, tattoo                            placement Indications:              Heme positive stool, Iron deficiency anemia                           76-year-old female with history of atrial                            fibrillation (on Xarelto), CHF (EF 55-60%), CM,                            admitted with A-fib with RVR. GI service consulted                            due to microcytic anemia and heme positive stool.                            She denies any overt bleeding.                           H/H 8.5/29 from baseline of 14/41 in 2021. Labs                            confirmatory of iron deficiency with ferritin 7,                            iron 15, TIBC 535, sat 3%. Will benefit from IV                            iron during this hospital course. Normal CRP but                            elevated ESR 57. B12 deficient at 166, and was                            started on B12 yesterday. Folate low normal at 7.3,                            and was started on folic acid 1 mg/day yesterday as                            well.                           Has now been off heparin for 4+ hours. Ready to                              proceed with EGD/colonoscopy for diagnostic and                            therapeutic intent. Providers:                 , MD, Cassidy Grevelding, Technician Referring MD:              Medicines:                Monitored Anesthesia Care Complications:            No immediate complications. Estimated Blood Loss:     Estimated blood loss was minimal. Procedure:                Pre-Anesthesia Assessment:                           - Prior to the procedure, a History and Physical                             was performed, and patient medications and                            allergies were reviewed. The patient's tolerance of                            previous anesthesia was also reviewed. The risks                            and benefits of the procedure and the sedation                            options and risks were discussed with the patient.                            All questions were answered, and informed consent                            was obtained. Prior Anticoagulants: The patient has                            taken heparin, last dose was day of procedure (4                            hour hold). ASA Grade Assessment: III - A patient                            with severe systemic disease. After reviewing the                            risks and benefits, the patient was deemed in                            satisfactory condition to undergo the procedure.                             After obtaining informed consent, the colonoscope                            was passed under direct vision. Throughout the                            procedure, the patient's blood pressure, pulse, and                            oxygen saturations were monitored continuously. The                            CF-HQ190L ([id number] Olympus coloscope was                            introduced through the anus and advanced to the the                            cecum, identified by the ileocecal valve. The                            colonoscopy was performed without difficulty. The                            patient tolerated the procedure well. The quality                            of the bowel preparation was adequate and fair. The                            ileocecal valve and the rectum were photographed. Scope In: 12:16:00 PM Scope Out: 12:36:03 PM Scope Withdrawal Time: 0 hours 15 minutes 11 seconds  Total Procedure Duration: 0 hours 20 minutes 3 seconds  Findings:       The perianal and digital rectal examinations were normal.      A frond-like/villous and ulcerated completely obstructing large mass was       found in the cecum and at the ileocecal valve. Difficult to ascertain if       this was eminating from the ileum and into the colon, or vice versa.       Mild contact oozing was present. This was biopsied with a cold forceps       for histology. Area 2 cm distal to the mass was tattooed on the       mesenteric and anti-mesenteric sides with an injection of 2 mL of Spot       (carbon black). Estimated blood loss was minimal.      Two sessile polyps were found in the descending colon and transverse       colon. The polyps were 4 to 5 mm in size. These polyps were removed with       a cold snare. Resection and retrieval were complete. Estimated blood       loss was minimal.      An 8 mm polyp was found in the sigmoid colon. The polyp was sessile. The       polyp was removed   with a cold snare. Resection and retrieval were       complete. Estimated blood loss was minimal.      Multiple large-mouthed and small-mouthed diverticula were found in the       sigmoid colon.      Non-bleeding internal hemorrhoids were found during retroflexion. The       hemorrhoids were small.      A moderate amount of stool was found throughout the colon, with highest       density in the sigmoid colon, descending colon, and rectum, interfering       with visualization. Lavage of the area was performed using copious       amounts of tap water, resulting in clearance with fair visualization. Impression:               - Preparation of the colon was fair, but adequate                            for the purposes of this study.                           - Malignant completely obstructing tumor in the                            cecum and at the ileocecal valve. Biopsied.                            Tattooed.                           - Two 4 to 5 mm polyps in the descending colon and                             in the transverse colon, removed with a cold snare.                            Resected and retrieved.                           - One 8 mm polyp in the sigmoid colon, removed with                            a cold snare. Resected and retrieved.                           - Diverticulosis in the sigmoid colon.                           - Non-bleeding internal hemorrhoids.                           - Stool in the sigmoid colon and in the descending                            colon. Recommendation:           - Return patient to hospital ward for  ongoing care.                           - Clear liquid diet today pending surgical                            evaluation. If no plan for inpatient surgery, will                            advance diet.                           - Continue present medications.                           - Await pathology results.                           - Consult inpatient General Surgery/Colorectal                            Surgery today.                           - CT abdomen/pelvis for staging. Completed CT chest                            on 05/27/2022.                           - CEA.                           - Ok to resume heparin gtt.                           - IV iron on this admission.                           - Continue B12 and folate.                           - Continue serial CBC checks. Procedure Code(s):        --- Professional ---                           45385, Colonoscopy, flexible; with removal of                            tumor(s), polyp(s), or other lesion(s) by snare                            technique                           45380, 59, Colonoscopy, flexible; with biopsy,                              single or multiple                           45381, Colonoscopy, flexible; with directed                            submucosal injection(s), any substance Diagnosis Code(s):        --- Professional ---                            K64.8, Other hemorrhoids                           C18.0, Malignant neoplasm of cecum                           K56.691, Other complete intestinal obstruction                           D12.4, Benign neoplasm of descending colon                           D12.3, Benign neoplasm of transverse colon (hepatic                            flexure or splenic flexure)                           D12.5, Benign neoplasm of sigmoid colon                           R19.5, Other fecal abnormalities                           D50.9, Iron deficiency anemia, unspecified                           K57.30, Diverticulosis of large intestine without                            perforation or abscess without bleeding CPT copyright 2022 American Medical Association. All rights reserved. The codes documented in this report are preliminary and upon coder review may  be revised to meet current compliance requirements.  , MD 05/29/2022 1:03:01 PM Number of Addenda: 0 

## 2022-05-29 NOTE — Anesthesia Preprocedure Evaluation (Addendum)
Anesthesia Evaluation  Patient identified by MRN, date of birth, ID band Patient awake    Reviewed: Allergy & Precautions, NPO status , Patient's Chart, lab work & pertinent test results  Airway Mallampati: II  TM Distance: >3 FB Neck ROM: Full    Dental  (+) Teeth Intact, Dental Advisory Given   Pulmonary neg pulmonary ROS   breath sounds clear to auscultation       Cardiovascular +CHF  + dysrhythmias Atrial Fibrillation  Rhythm:Regular Rate:Normal  Echo: 1. Left ventricular ejection fraction, by estimation, is 55 to 60%. The  left ventricle has normal function. The left ventricle has no regional  wall motion abnormalities. Left ventricular diastolic parameters are  indeterminate.   2. Right ventricular systolic function is normal. The right ventricular  size is normal. There is normal pulmonary artery systolic pressure.   3. Left atrial size was mildly dilated.   4. The mitral valve is normal in structure. Mild mitral valve  regurgitation. No evidence of mitral stenosis.   5. Tricuspid valve regurgitation is mild to moderate.   6. The aortic valve is tricuspid. Aortic valve regurgitation is not  visualized. Aortic valve sclerosis is present, with no evidence of aortic  valve stenosis.   7. The inferior vena cava is normal in size with greater than 50%  respiratory variability, suggesting right atrial pressure of 3 mmHg.     Neuro/Psych  Neuromuscular disease  negative psych ROS   GI/Hepatic negative GI ROS, Neg liver ROS,,,  Endo/Other  negative endocrine ROS    Renal/GU Renal disease     Musculoskeletal negative musculoskeletal ROS (+)    Abdominal   Peds  Hematology negative hematology ROS (+)   Anesthesia Other Findings   Reproductive/Obstetrics                             Anesthesia Physical Anesthesia Plan  ASA: 3  Anesthesia Plan: MAC   Post-op Pain Management: Minimal  or no pain anticipated   Induction: Intravenous  PONV Risk Score and Plan: 0 and Propofol infusion  Airway Management Planned: Natural Airway and Simple Face Mask  Additional Equipment: None  Intra-op Plan:   Post-operative Plan:   Informed Consent: I have reviewed the patients History and Physical, chart, labs and discussed the procedure including the risks, benefits and alternatives for the proposed anesthesia with the patient or authorized representative who has indicated his/her understanding and acceptance.       Plan Discussed with: CRNA  Anesthesia Plan Comments:         Anesthesia Quick Evaluation

## 2022-05-29 NOTE — Progress Notes (Signed)
Mobility Specialist - Progress Note   05/29/22 1144  Mobility  Activity Off unit   Pt currently off unit for procedure. Will follow up if time permits.   Franki Monte  Mobility Specialist Please contact via Solicitor or Rehab office at [phone]

## 2022-05-29 NOTE — Anesthesia Procedure Notes (Signed)
Procedure Name: MAC Date/Time: 05/29/2022 11:54 AM  Performed by: Eligha Bridegroom, CRNAPre-anesthesia Checklist: Patient identified, Emergency Drugs available, Patient being monitored, Suction available and Timeout performed Patient Re-evaluated:Patient Re-evaluated prior to induction Oxygen Delivery Method: Nasal cannula Preoxygenation: Pre-oxygenation with 100% oxygen Induction Type: IV induction

## 2022-05-29 NOTE — Progress Notes (Signed)
PT Cancellation Note  Patient Details Name: Natasha Dickson MRN: [id number] DOB: [birth year]   Cancelled Treatment:    Reason Eval/Treat Not Completed: (P) Patient at procedure or test/unavailable Pt unable to get colonoscopy this morning and has returned to OR for colonoscopy this afternoon. PT will follow back for Evaluation tomorrow. [name] B. Migdalia Dk PT, DPT Acute Rehabilitation Services Please use secure chat or  Call Office [phone]    [place] 05/29/2022, 11:50 AM

## 2022-05-29 NOTE — Plan of Care (Signed)

## 2022-05-29 NOTE — Progress Notes (Signed)
Pt transported to endo preop for egd/colonoscopy. Dr. Bryan Lemma informed heparin drip was not stopped prior to procedures. Per MD, stop heparin 4 hours prior to procedures. Heparin stopped and 6E RN made aware. Pt transported back to [place] and updated on plan of care to delay procedures 4 hours.

## 2022-05-29 NOTE — Op Note (Addendum)
[facility] Patient Name: Natasha Dickson Procedure Date : 05/29/2022 MRN: [id number] Attending MD: Gerrit Heck , MD, [id number] Date of Birth: [birth year] CSN: [id number] Age: 76 Admit Type: Outpatient Procedure:                Upper GI endoscopy w/ biopsy Indications:              Iron deficiency anemia, , Heme positive stool,                            Symptomatic microcytic anemia, B12 deficiency                           76 year old female with history of atrial                            fibrillation (on Xarelto), CHF (EF 55-60%), CM,                            admitted with A-fib with RVR. GI service consulted                            due to microcytic anemia and heme positive stool.                            She denies any overt bleeding.                           H/H 8.5/29 from baseline of 14/41 in 2021. Labs                            confirmatory of iron deficiency with ferritin 7,                            iron 15, TIBC 535, sat 3%. Will benefit from IV                            iron during this hospital course. Normal CRP but                            elevated ESR 57. B12 deficient at 166, and was                            started on B12 yesterday. Folate low normal at 7.3,                            and was started on folic acid 1 mg/day yesterday as                            well.                           Has now been off heparin for 4+ hours. Ready to  proceed with EGD/colonoscopy for diagnostic and                            therapeutic intent. Providers:                Gerrit Heck, MD, Gloris Ham, Technician Referring MD:              Medicines:                Monitored Anesthesia Care Complications:            No immediate complications. Estimated Blood Loss:     Estimated blood loss was minimal. Procedure:                Pre-Anesthesia Assessment:                           - Prior to the procedure, a  History and Physical                            was performed, and patient medications and                            allergies were reviewed. The patient's tolerance of                            previous anesthesia was also reviewed. The risks                            and benefits of the procedure and the sedation                            options and risks were discussed with the patient.                            All questions were answered, and informed consent                            was obtained. Prior Anticoagulants: The patient has                            taken heparin, last dose was day of procedure (4                            hour hold). ASA Grade Assessment: III - A patient                            with severe systemic disease. After reviewing the                            risks and benefits, the patient was deemed in                            satisfactory condition to undergo the procedure.  After obtaining informed consent, the endoscope was                            passed under direct vision. Throughout the                            procedure, the patient's blood pressure, pulse, and                            oxygen saturations were monitored continuously. The                            [id number]-[id number] ([id number] Olympus endoscope was introduced                            through the mouth, and advanced to the second part                            of duodenum. The upper GI endoscopy was                            accomplished without difficulty. The patient                            tolerated the procedure well. Scope In: Scope Out: Findings:      The examined esophagus was normal.      A 3 cm hiatal hernia was present.      Localized minimal inflammation characterized by congestion (edema) and       erythema was found in the gastric antrum. Biopsies were taken with a       cold forceps for histology. Estimated blood loss was  minimal.      Normal mucosa was found in the gastric fundus and in the gastric body.       Biopsies were taken with a cold forceps for Helicobacter pylori testing.       Estimated blood loss was minimal.      A few small sessile polyps with no bleeding were found in the gastric       body.      The examined duodenum was normal. Biopsies were taken with a cold       forceps for histology. Estimated blood loss was minimal. Impression:               - Normal esophagus.                           - 3 cm hiatal hernia.                           - Gastritis. Biopsied.                           - Normal mucosa was found in the gastric fundus and                            in the gastric body. Biopsied.                           -  A few gastric polyps.                           - Normal examined duodenum. Biopsied. Recommendation:           - Perform a colonoscopy today.                           - Will follow-up on pathology results.                           - Additional accommodations pending colonoscopy                            findings. Procedure Code(s):        --- Professional ---                           [phone], Esophagogastroduodenoscopy, flexible,                            transoral; with biopsy, single or multiple Diagnosis Code(s):        --- Professional ---                           K44.9, Diaphragmatic hernia without obstruction or                            gangrene                           K29.70, Gastritis, unspecified, without bleeding                           K31.7, Polyp of stomach and duodenum                           D50.9, Iron deficiency anemia, unspecified                           R19.5, Other fecal abnormalities CPT copyright 2022 American Medical Association. All rights reserved. The codes documented in this report are preliminary and upon coder review may  be revised to meet current compliance requirements. Gerrit Heck, MD 05/29/2022 12:49:50 PM Number  of Addenda: 0

## 2022-05-29 NOTE — Progress Notes (Addendum)
Dear Doctor: This patient has been identified as a candidate for PICC/CVC for the following reason (s): IV therapy over 48 hours, drug extravasation potential with tissue necrosis (KCL, Dilantin, Dopamine, CaCl, MgSO4, chemo vesicant), poor veins/poor circulatory system (CHF, COPD, emphysema, diabetes, steroid use, IV drug abuse, etc.), restarts due to phlebitis and infiltration in 24 hours, and incompatible drugs (aminophyllin, TPN, heparin, given with an antibiotic) If you agree, please write an order for the indicated device.   Thank you for supporting the early vascular access assessment program.

## 2022-05-29 NOTE — Interval H&P Note (Signed)
History and Physical Interval Note:  H/H largely stable at 8.5/29.  Labs confirmatory of iron deficiency with ferritin 7, iron 15, TIBC 535, sat 3%.  Will benefit from IV iron during this hospital course.  Normal CRP but elevated ESR 57.  B12 deficient at 166, and was started on B12 yesterday.  Folate low normal at 7.3, and was started on folic acid 1 mg/day yesterday as well.  Has now been off heparin for 4+ hours.  Ready to proceed with EGD/colonoscopy for diagnostic and therapeutic intent.    05/29/2022 11:30 AM  Natasha Dickson  has presented today for surgery, with the diagnosis of anemia.  fobt +.  The various methods of treatment have been discussed with the patient and family. After consideration of risks, benefits and other options for treatment, the patient has consented to  Procedure(s): COLONOSCOPY WITH PROPOFOL (N/A) ESOPHAGOGASTRODUODENOSCOPY (EGD) WITH PROPOFOL (N/A) as a surgical intervention.  The patient's history has been reviewed, patient examined, no change in status, stable for surgery.  I have reviewed the patient's chart and labs.  Questions were answered to the patient's satisfaction.     Dominic Pea [employer] [place]

## 2022-05-30 DIAGNOSIS — K6389 Other specified diseases of intestine: Secondary | ICD-10-CM | POA: Diagnosis not present

## 2022-05-30 DIAGNOSIS — D125 Benign neoplasm of sigmoid colon: Secondary | ICD-10-CM | POA: Diagnosis not present

## 2022-05-30 DIAGNOSIS — I5032 Chronic diastolic (congestive) heart failure: Secondary | ICD-10-CM | POA: Diagnosis not present

## 2022-05-30 DIAGNOSIS — R195 Other fecal abnormalities: Secondary | ICD-10-CM | POA: Diagnosis not present

## 2022-05-30 DIAGNOSIS — I4891 Unspecified atrial fibrillation: Secondary | ICD-10-CM | POA: Diagnosis not present

## 2022-05-30 DIAGNOSIS — D124 Benign neoplasm of descending colon: Secondary | ICD-10-CM | POA: Diagnosis not present

## 2022-05-30 DIAGNOSIS — E538 Deficiency of other specified B group vitamins: Secondary | ICD-10-CM | POA: Diagnosis not present

## 2022-05-30 LAB — CBC
HCT: 27.8 % — ABNORMAL LOW (ref 36.0–46.0)
Hemoglobin: 8.2 g/dL — ABNORMAL LOW (ref 12.0–15.0)
MCH: 23 pg — ABNORMAL LOW (ref 26.0–34.0)
MCHC: 29.5 g/dL — ABNORMAL LOW (ref 30.0–36.0)
MCV: 77.9 fL — ABNORMAL LOW (ref 80.0–100.0)
Platelets: 425 10*3/uL — ABNORMAL HIGH (ref 150–400)
RBC: 3.57 MIL/uL — ABNORMAL LOW (ref 3.87–5.11)
RDW: 16.8 % — ABNORMAL HIGH (ref 11.5–15.5)
WBC: 10.1 10*3/uL (ref 4.0–10.5)
nRBC: 0.3 % — ABNORMAL HIGH (ref 0.0–0.2)

## 2022-05-30 LAB — BASIC METABOLIC PANEL
Anion gap: 8 (ref 5–15)
BUN: 7 mg/dL — ABNORMAL LOW (ref 8–23)
CO2: 19 mmol/L — ABNORMAL LOW (ref 22–32)
Calcium: 8.5 mg/dL — ABNORMAL LOW (ref 8.9–10.3)
Chloride: 107 mmol/L (ref 98–111)
Creatinine, Ser: 1.1 mg/dL — ABNORMAL HIGH (ref 0.44–1.00)
GFR, Estimated: 52 mL/min — ABNORMAL LOW (ref >60)
Glucose, Bld: 79 mg/dL (ref 70–99)
Potassium: 4.2 mmol/L (ref 3.5–5.1)
Sodium: 134 mmol/L — ABNORMAL LOW (ref 135–145)

## 2022-05-30 LAB — HEPARIN LEVEL (UNFRACTIONATED): Heparin Unfractionated: 0.47 IU/mL (ref 0.30–0.70)

## 2022-05-30 MED ORDER — CHLORHEXIDINE GLUCONATE CLOTH 2 % EX PADS
6.0000 | MEDICATED_PAD | Freq: Once | CUTANEOUS | Status: AC
  Administered 2022-05-31: 6 via TOPICAL

## 2022-05-30 MED ORDER — ENSURE PRE-SURGERY PO LIQD
592.0000 mL | Freq: Once | ORAL | Status: AC
  Administered 2022-05-30: 592 mL via ORAL
  Filled 2022-05-30: qty 592

## 2022-05-30 MED ORDER — ENSURE PRE-SURGERY PO LIQD
296.0000 mL | Freq: Once | ORAL | Status: DC
  Filled 2022-05-30: qty 296

## 2022-05-30 MED ORDER — SODIUM CHLORIDE 0.9 % IV SOLN: 2.0000 g | INTRAVENOUS | Status: AC

## 2022-05-30 MED ORDER — CHLORHEXIDINE GLUCONATE CLOTH 2 % EX PADS
6.0000 | MEDICATED_PAD | Freq: Once | CUTANEOUS | Status: AC
  Administered 2022-05-30: 6 via TOPICAL

## 2022-05-30 MED ORDER — ATORVASTATIN CALCIUM 10 MG PO TABS
10.0000 mg | ORAL_TABLET | Freq: Every day | ORAL | Status: DC
  Administered 2022-05-30 – 2022-06-12 (×14): 10 mg via ORAL
  Filled 2022-05-30 (×14): qty 1

## 2022-05-30 MED ORDER — METOPROLOL TARTRATE 12.5 MG HALF TABLET
12.5000 mg | ORAL_TABLET | Freq: Two times a day (BID) | ORAL | Status: DC
  Administered 2022-05-30: 12.5 mg via ORAL
  Filled 2022-05-30: qty 1

## 2022-05-30 MED ORDER — SODIUM CHLORIDE 0.9 % IV SOLN: INTRAVENOUS | Status: AC

## 2022-05-30 MED ORDER — METRONIDAZOLE 500 MG PO TABS
1000.0000 mg | ORAL_TABLET | ORAL | Status: AC
  Administered 2022-05-30: 1000 mg via ORAL
  Filled 2022-05-30: qty 2

## 2022-05-30 MED ORDER — SODIUM CHLORIDE 0.9 % IV SOLN: INTRAVENOUS | Status: DC

## 2022-05-30 MED ORDER — NEOMYCIN SULFATE 500 MG PO TABS
1000.0000 mg | ORAL_TABLET | ORAL | Status: AC
  Administered 2022-05-30: 1000 mg via ORAL
  Filled 2022-05-30 (×2): qty 2

## 2022-05-30 NOTE — Progress Notes (Signed)
I was able to reach family via phone this evening after numerous unsuccessful attempts thus far as previously noted. Discussed over speaker phone with Jeannie Done. Reviewed that it was her preference we contact her daughter, Margaretha Sheffield. She has made it clear to [place] that this was the person she would like to make decisions in the event she were unable to do so herself. I offered to speak with other family members but she has made it clear to [place] that she wished for [place] to speak directly with Margaretha Sheffield.  This evening, we reviewed her workup to date including diagnostic studies to evaluate her symptomatic iron deficiency anemia. We discussed the findings on colonoscopy with Dr. Bryan Lemma including the large mass in her ileocecal region endosopically and now pathologically consistent with a colon cancer. We discussed the most likely source at present for her iron deficiency anemia being this mass in her cecum. We discussed the CT scans completed as part of her workup which have not shown any evidence of metastatic disease. We discussed the evaluation to date completed by the internal medicine and cardiology teams. We discussed her being 'cleared' from a cardiology perspective for a planned abdominal procedure without other things they would recommend to optimize.   We discussed what the surgical plan would involve including laparoscopic right hemicolectomy.  We were then made aware that this afternoon with therapy in particular she has had intermittent hypotension. Per nursing, medicine was made aware and fluids provided. Her blood pressure improved and they are monitoring. Explained that this occurred after morning rounds and we were unaware but agreed with it being addressed. I also asked nursing to alert medicine and cardiology to her response.  It is the family's wishes to postpone the originally planned surgery tomorrow and reconsider over the next week while she remains inpatient. They would like to further  discuss with cardiology, medicine and monitor her response. They understand the potential risk for clinical decompensation, ongoing bleeding from her colon cancer.  I also reviewed our surgical practice's acute care surgery model and explained that they would likely see multiple surgeons from our practice over the coming days/week. Our service will certainly remain involved in her care in the interim and available to answer questions while more decisions amongst the family are made.   Nadeen Landau, MD [facility], [facility]

## 2022-05-30 NOTE — Anesthesia Postprocedure Evaluation (Signed)
Anesthesia Post Note  Patient: Natasha Dickson  Procedure(s) Performed: COLONOSCOPY WITH PROPOFOL ESOPHAGOGASTRODUODENOSCOPY (EGD) WITH PROPOFOL BIOPSY POLYPECTOMY SUBMUCOSAL TATTOO INJECTION     Patient location during evaluation: PACU Anesthesia Type: MAC Level of consciousness: awake and alert Pain management: pain level controlled Vital Signs Assessment: post-procedure vital signs reviewed and stable Respiratory status: spontaneous breathing, nonlabored ventilation, respiratory function stable and patient connected to nasal cannula oxygen Cardiovascular status: stable and blood pressure returned to baseline Postop Assessment: no apparent nausea or vomiting Anesthetic complications: no   No notable events documented.              Effie Berkshire

## 2022-05-30 NOTE — Progress Notes (Addendum)
ANTICOAGULATION CONSULT NOTE  Pharmacy Consult for heparin  Indication: atrial fibrillation  No Known Allergies  Patient Measurements: Height: '5\' 1"'$  (154.9 cm) Weight: 93 kg (205 lb 0.4 oz) IBW/kg (Calculated) : 47.8 Heparin Dosing Weight: 69.7kg   Vital Signs: Temp: 99.1 F (37.3 C) (12/21 0622) Temp Source: Axillary (12/21 0622) BP: 104/45 (12/21 0622) Pulse Rate: 94 (12/21 0622)  Labs: Recent Labs    05/27/22 1143 05/27/22 1809 05/28/22 0120 05/28/22 0425 05/28/22 1225 05/28/22 1944 05/29/22 0144 05/29/22 2207 05/30/22 0227  HGB 9.0*  --  8.0*   < >  --  9.3* 8.5*  --  8.2*  HCT 31.2*  --  27.2*   < >  --  30.6* 28.9*  --  27.8*  PLT 467*  --  403*  --   --   --  451*  --  425*  APTT  --   --   --   --  54*  --  172* 68*  --   HEPARINUNFRC  --   --  0.45   < > 0.45  --  1.02* 0.24* 0.47  CREATININE 1.22*  --  1.13*  --   --   --   --   --  1.10*  CKTOTAL  --   --  34*  --   --   --   --   --   --   TROPONINIHS 5 5  --   --   --   --   --   --   --    < > = values in this interval not displayed.     Estimated Creatinine Clearance: 45.3 mL/min (A) (by C-G formula based on SCr of 1.1 mg/dL (H)).   Medical History: Past Medical History:  Diagnosis Date   Breast cancer ([place]) 1999   Stage I in left breast   Chronic combined systolic and diastolic CHF (congestive heart failure) ([place])    a. 02/2013 Echo: EF 30-35%, diff HK, biatrial enlargement. b. 2D echo 07/2014 showed mildly dilated LV, EF 45-50%, severely dilated left atrium and moderately dilated right atrium.   CKD (chronic kidney disease), stage II    Closed fracture of unspecified part of neck of femur 2011   Right.  Soreness when standing. (Dr. Durward Fortes)   Nonischemic cardiomyopathy [facility])    a. 02/2013 Lexi CL: No ischemia, prob attenuation vs scar, EF 40%-->Med Rx.   Persistent atrial fibrillation ([place])    a. 02/2013: s/p TEE and attempted cardioversion x 2-->failed-->rate controlled with bb/digoxin,  Xarelto initiated.    Assessment: 16 yoF admitted with AF RVR. Pt on Xarelto PTA for anticoagulation, on hold for IV heparin.  Heparin level is therapeutic and correlating with aPTTs, CBC stable.  Goal of Therapy:  Heparin level 0.3-0.7 units/ml Monitor platelets by anticoagulation protocol: Yes   Plan:  Continue heparin 1100 units/h Daily heparin level and CBC  ADDENDUM heparin to stop at midnight tonight, stop time placed.  Arrie Senate, PharmD, BCPS, [facility] Clinical Pharmacist Please check AMION for all [place] numbers 05/30/2022

## 2022-05-30 NOTE — Progress Notes (Signed)
While working with PT/OT this am, pt's blood pressure dropped from 106/50 at 1033 to 67/56 at 1041 when standing. Her only complaint was "being a little dizzy". With sitting for around 10 minutes, pt's BP came up to 90/53. At 1131 in the chair blood pressure was 71/41, asymptomatic, and cardiology started continuous IV fluids. Last BP in bed at 1830 was 112/33.

## 2022-05-30 NOTE — Progress Notes (Signed)
ANTICOAGULATION CONSULT NOTE - Follow Up Consult  Pharmacy Consult for heparin Indication: atrial fibrillation  Labs: Recent Labs    05/27/22 1143 05/27/22 1809 05/28/22 0120 05/28/22 0425 05/28/22 0918 05/28/22 1225 05/28/22 1944 05/29/22 0144 05/29/22 2207  HGB 9.0*  --  8.0*  --  9.0*  --  9.3* 8.5*  --   HCT 31.2*  --  27.2*  --  30.9*  --  30.6* 28.9*  --   PLT 467*  --  403*  --   --   --   --  451*  --   APTT  --   --   --   --   --  54*  --  172* 68*  HEPARINUNFRC  --   --  0.45   < >  --  0.45  --  1.02* 0.24*  CREATININE 1.22*  --  1.13*  --   --   --   --   --   --   CKTOTAL  --   --  34*  --   --   --   --   --   --   TROPONINIHS 5 5  --   --   --   --   --   --   --    < > = values in this interval not displayed.    Assessment/Plan:  76yo female subtherapeutic on heparin after resumed but may need more time to accumulate. Will continue infusion at current rate of 1100 units/hr and check level with am labs.     Wynona Neat, PharmD, BCPS  05/30/2022,12:29 AM

## 2022-05-30 NOTE — Progress Notes (Signed)
[facility] Progress Note  1 Day Post-Op  Subjective: CC-  Up in chair, working with PT.  Denies any current abdominal pain, nausea, vomiting. She does report RLQ abdominal pain at times. No flatus or BM. Denies any nausea or vomiting and she is tolerating clear liquids.  Objective: Vital signs in last 24 hours: Temp:  [97.2 F (36.2 C)-99.1 F (37.3 C)] 99.1 F (37.3 C) (12/21 0622) Pulse Rate:  [61-122] 61 (12/21 0910) Resp:  [12-28] 12 (12/21 0910) BP: (67-137)/(30-94) 71/41 (12/21 1131) SpO2:  [85 %-98 %] 98 % (12/21 0910) Last BM Date : 05/29/22  Intake/Output from previous day: 12/20 0701 - 12/21 0700 In: 1258.2 [P.O.:120; I.V.:868.2; IV Piggyback:270] Out: -  Intake/Output this shift: Total I/O In: -  Out: 300 [Urine:300]  PE: Gen:  Alert, NAD, pleasant HEENT: EOM's intact, pupils equal and round Card:  RRR Pulm: rate and effort normal Abd: obese, soft, ND, NT  Lab Results:  Recent Labs    05/29/22 0144 05/30/22 0227  WBC 10.9* 10.1  HGB 8.5* 8.2*  HCT 28.9* 27.8*  PLT 451* 425*   BMET Recent Labs    05/28/22 0120 05/30/22 0227  NA 136 134*  K 4.4 4.2  CL 105 107  CO2 21* 19*  GLUCOSE 98 79  BUN 13 7*  CREATININE 1.13* 1.10*  CALCIUM 8.7* 8.5*   PT/INR No results for input(s): "LABPROT", "INR" in the last 72 hours. CMP     Component Value Date/Time   NA 134 (L) 05/30/2022 0227   NA 141 10/11/2021 1136   K 4.2 05/30/2022 0227   CL 107 05/30/2022 0227   CO2 19 (L) 05/30/2022 0227   GLUCOSE 79 05/30/2022 0227   BUN 7 (L) 05/30/2022 0227   BUN 14 10/11/2021 1136   CREATININE 1.10 (H) 05/30/2022 0227   CREATININE 0.99 03/11/2016 0911   CALCIUM 8.5 (L) 05/30/2022 0227   PROT 5.6 (L) 05/28/2022 0120   PROT 7.1 07/04/2020 1041   ALBUMIN 2.6 (L) 05/28/2022 0120   ALBUMIN 4.4 07/04/2020 1041   AST 17 05/28/2022 0120   ALT 7 05/28/2022 0120   ALKPHOS 76 05/28/2022 0120   BILITOT 0.6 05/28/2022 0120   BILITOT 1.1 07/04/2020  1041   GFRNONAA 52 (L) 05/30/2022 0227   GFRAA 55 (L) 07/04/2020 1041   Lipase  No results found for: "LIPASE"     Studies/Results: CT ABDOMEN PELVIS W CONTRAST  Result Date: 05/29/2022 CLINICAL DATA:  Colon carcinoma staging EXAM: CT ABDOMEN AND PELVIS WITH CONTRAST TECHNIQUE: Multidetector CT imaging of the abdomen and pelvis was performed using the standard protocol following bolus administration of intravenous contrast. RADIATION DOSE REDUCTION: This exam was performed according to the departmental dose-optimization program which includes automated exposure control, adjustment of the mA and/or kV according to patient size and/or use of iterative reconstruction technique. CONTRAST:  143m OMNIPAQUE IOHEXOL 350 MG/ML SOLN COMPARISON:  None Available. FINDINGS: Lower chest: Heart is enlarged in size. Scattered coronary artery calcifications are seen. There are small linear densities in the lower lung fields suggesting minimal scarring or subsegmental atelectasis. There is no focal consolidation in the lower lung fields. There is reconstruction prosthesis in left breast. Hepatobiliary: No focal abnormalities are seen in liver. There is no dilation of bile ducts. There are large gallbladder stones. There is no wall thickening in gallbladder. There is no fluid around the gallbladder. Pancreas: No focal abnormalities are seen. Spleen: Unremarkable. Adrenals/Urinary Tract: There is mild hyperplasia of adrenals without  discrete nodules. There is no hydronephrosis. There is 5 mm calculus in the lower pole right kidney. Ureters are not dilated. Urinary bladder is unremarkable. Stomach/Bowel: Small hiatal hernia is seen. Stomach is not distended. There is no significant small bowel dilation. Appendix is not seen. There is 4.5 x 2.6 cm asymmetric mass lesion in the cecum close to the ileocolic junction. This lesion has to be considered a malignant neoplasm. There is mild asymmetric wall thickening in hepatic  flexure measuring 2.2 cm in length and 6 mm in thickness. Scattered diverticula are seen in colon without signs of focal acute diverticulitis. There is no pericolic stranding. Vascular/Lymphatic: Scattered arterial calcifications are seen in aorta and its major branches. There are few subcentimeter mesenteric nodes. In view of the mass seen in cecum, follow-up PET-CT may be considered for further characterization of the slightly enlarged mesenteric lymph nodes. Reproductive: Unremarkable. Other: There is no ascites or pneumoperitoneum. Umbilical hernia containing fat is seen. Maximum diameter of hernial sac measures 5.8 cm. Musculoskeletal: There is 50% decrease in height of body of L1 vertebra, most likely old fracture. There is mild retropulsion in the upper endplate of body of L1 vertebra. There is 30% decrease in height of upper endplate of body of L3 vertebra. This may be recent or old. There is previous right hip arthroplasty. IMPRESSION: There is 4.5 x 2.6 cm asymmetric wall thickening in cecum close to the ileocecal junction. This lesion has to be considered a malignant neoplasm. There are few subcentimeter mesenteric lymph nodes which may suggest incidental benign reactive hyperplasia. In view of the mass lesion in the cecum, PET-CT may be considered for further characterization of mesenteric nodes. There is small focus of asymmetric wall thickening in hepatic flexure which may be an artifact due to incomplete distention or suggest neoplastic process. Please correlate with endoscopic findings. No focal abnormalities are seen in liver. 50% decrease in height of body of L1 vertebra most likely is old compression fracture. There is 30% decrease in height of upper endplate of body of L3 vertebra which may be recent or old fracture. If there are focal symptoms, follow-up MRI may be considered. There is no evidence of intestinal obstruction or pneumoperitoneum. There is no hydronephrosis. Large gallbladder stones  without signs of acute cholecystitis or dilation of bile ducts. Small hiatal hernia. Diverticulosis of colon. Nonobstructing right renal calculus. There is moderate sized umbilical hernia containing fat. Aortic arteriosclerosis. Coronary artery calcifications are seen. Other findings as described in the body of the report. Electronically Signed   By: Elmer Picker M.D.   On: 05/29/2022 20:08    Anti-infectives: Anti-infectives (From admission, onward)    None        Assessment/Plan Colon Mass  - Colonoscopy 12/20 showed completely obstructing tumor in the cecum and at the ileocecal valve, along with polyps of the transverse, descending, and sigmoid colon - CEA pending - CT chest w/o evidence of mets. CT a/p subcentimeter mesenteric lymph nodes, no abnormalities seen in the liver - Cardiology following and recs No further CV work-up needed prior to OR, revised cardiac risk index 10.1%  - Ideally would have her pathology back prior to proceeding to OR, specifically for the polyps. Called and spoke with pathology, Dr. Sable Feil will be reading her results. It may be back today vs tomorrow. Will make her NPO after midnight for possible OR tomorrow. I will call the patient's daughter today for an update.  FEN - CLD, NPO after MN VTE - SCDs, heparin  gtt ID - None   A. Fib on Xarelto (last dose 12/18, currently on heparin gtt) CHF (30-35% 2014, now 55-60% in 04/2022) HTN HLD CKD2   I reviewed Consultant GI notes, last 24 h vitals and pain scores, last 48 h intake and output, last 24 h labs and trends, and last 24 h imaging results.    LOS: 3 days    [facility] 05/30/2022, 11:53 AM Please see Amion for pager number during day hours 7:00am-4:30pm

## 2022-05-30 NOTE — Progress Notes (Signed)
[facility] PROGRESS NOTE  Natasha Dickson (DOB: 08-01-1945) RWE:[id number] PCP: Tonia Ghent, MD  Brief Narrative: [name] is a 76 y.o. female with medical history significant of atrial fibrillation on xarelto, systolic HF (recovered EF), non ischemic cardiomyopathy, hypertension and multiple other medical issues here with progressive generalized weakness.  Found to have new anemia and + FOBT as well as AFib with RVR. She was admitted with cardiology consultations. GI took for EGD/colonoscopy 12/20 which showed a "malignant completely obstructing tumor in the cecum and at the ileocecal valve," as well as polyps in the transverse, descending, and sigmoid colon which were also sent to pathology. CT abd/pelvis showed mesenteric LNs, no liver lesions. Surgery is consulted for colectomy, the extent of which will be determined by pathology report from polyps.    Subjective: Doesn't love the food here, denies chest pain or dyspnea. Denies dizziness/lightheadedness ("I keep telling them no, I'm not dizzy") despite soft BPs, though did apparently report this to OT. Wants to have surgery. Says she has some pain where they "worked on [her]" internally yesterday, pointing to RLQ. No BM of flatus today.  Objective: BP (!) 91/47 (BP Location: Right Arm)   Pulse 93   Temp 98.7 F (37.1 C) (Oral)   Resp 19   Ht '5\' 1"'$  (1.549 m)   Wt 93 kg   SpO2 98%   BMI 38.74 kg/m   Gen: Pleasant, elderly female in no acute distress Pulm: Clear, nonlabored  CV: Rapid irreg, BP with MAPs in 60's consistently GI: Soft, mildly tender to deep palpation diffusely, including RLQ, no distention, +BS.  Neuro: Alert and conversant, interactive. No new focal deficits. Ext: Warm, no deformities Skin: No new rashes, lesions or ulcers on visualized skin   Assessment & Plan: Obstructing colon mass at cecum:  - CLD per surgery, NPO p MN, will have pathology results back today vs. tomorrow, want to be prepared for OR  tomorrow. No further work up recommended by cardiology, RCRI 10.1%.  - CT abd/pelvis staging scan showed subcentimeter mesenteric LNs and 4.5cm x 2.6cm thickening of cecum close to ileocecal junction. No hepatic abnormalities. Not consistent with obstruction.  - CEA pending  Chronic combined HFrEF with recovered EF, HTN: Appears roughly euvolemic.  - Metoprolol per cardiology - Soft BP limiting other GDMT at this time. Holding home BP meds.  - Coronary calcifications noted on scans, though deferring ischemic evaluation at this time.  Persistent AFib with RVR:  - Ok to continue heparin per consultants at this time. CHA2DS2-VASc score is 5  - Increased metoprolol to '25mg'$  BID but now hypotensive, so held and lowered dose back. Rates fairly controlled but soft BP. Starting IVF per cardiology as we suspect she's not taking adequate po on CLD. - Digoxin per cardiology.  - Continue amiodarone gtt.   Symptomatic iron deficiency anemia: Ferritin is 7, iron 15, 3% sat, TIBC up to 535. Suspect thrombocytosis is reactive to this. - IV iron. - Monitor CBC again in AM, transfuse prn  Vitamin B12 deficiency: Level is 166. - IM supplement for now.   HLD: CK low.  - Statin  Stage IIIa CKD: Based on available CrCl values.  - Avoid nephrotoxins as able. Monitor BMP in AM  after receiving contrast with staging scan. Holding lasix for now.  Obesity: Body mass index is 38.74 kg/m.   Hypokalemia: Resolved  [name], MD [facility] [email or web address] 05/30/2022, 2:30 PM

## 2022-05-30 NOTE — Evaluation (Addendum)
Physical Therapy Evaluation Patient Details Name: Natasha Dickson MRN: [id number] DOB: 1945/07/05 Today's Date: 05/30/2022  History of Present Illness  76 y.o. female who presented to the ED 12/18 with hypotension and weakness. During work up found to have malignant completely obstructing tumor at cecum awaiting pathology before determining course of care.  PMH: A. Fib with RVR on Xarelto (last dose 12/18, currently on heparin gtt), CHF (30-35% 2014, now 55-60% in 04/2022), HTN, HLD, CKD2  Clinical Impression  PTA pt ws living with her husband in multi-story home with steps to enter. Pt will likely go home with her daughter where she will need to be able to climb 20 steps to enter but everything is on one level inside.Pt was independent with household ambulation without AD, used shopping cart for support with grocery shopping. Independent in ADLs, iADLs and driving. Pt is currently limited in safe mobility by orthostatic hypotension, and incontinence of urine in standing, in presence of decreased strength and balance. Pt is min A for bed mobility and min guard for transfer to RW and stepping to chair. PT recommending HHPT if pt can climb flight of stairs at discharge. Pt likely to have surgery in the next couple of days and PT will reassess for discharge disposition afterwards.      Recommendations for follow up therapy are one component of a multi-disciplinary discharge planning process, led by the attending physician.  Recommendations may be updated based on patient status, additional functional criteria and insurance authorization.  Follow Up Recommendations Skilled nursing-short term rehab (<3 hours/day) Can patient physically be transported by private vehicle: Yes    Assistance Recommended at Discharge Frequent or constant Supervision/Assistance  Patient can return home with the following  A little help with walking and/or transfers;A little help with bathing/dressing/bathroom;Assistance with  cooking/housework;Direct supervision/assist for medications management;Assist for transportation;Help with stairs or ramp for entrance    Equipment Recommendations None recommended by PT     Functional Status Assessment Patient has had a recent decline in their functional status and demonstrates the ability to make significant improvements in function in a reasonable and predictable amount of time.     Precautions / Restrictions Precautions Precautions: Fall Precaution Comments: watch BP Restrictions Weight Bearing Restrictions: No      Mobility  Bed Mobility Overal bed mobility: Needs Assistance Bed Mobility: Supine to Sit     Supine to sit: Min assist          Transfers Overall transfer level: Needs assistance Equipment used: Rolling walker (2 wheels) Transfers: Sit to/from Stand, Bed to chair/wheelchair/BSC Sit to Stand: Min guard   Step pivot transfers: Min guard       General transfer comment: limited by orthostatic hypotension    Ambulation/Gait               General Gait Details: deferred due to orthostatic hypotension      Balance Overall balance assessment: Needs assistance Sitting-balance support: Feet supported Sitting balance-Leahy Scale: Fair     Standing balance support: Single extremity supported, During functional activity Standing balance-Leahy Scale: Fair Standing balance comment: statically standing with only 1 UE supported                             Pertinent Vitals/Pain Pain Assessment Pain Assessment: Faces Faces Pain Scale: Hurts a little bit Pain Location: R lower quadrant Pain Intervention(s): Limited activity within patient's tolerance    Home Living Family/patient expects to  be discharged to:: Private residence Living Arrangements: Spouse/significant other;Children Available Help at Discharge: Available 24 hours/day Type of Home: House Home Access: Stairs to enter Entrance Stairs-Rails: None Entrance  Stairs-Number of Steps: 20 Alternate Level Stairs-Number of Steps: 12 Home Layout: Able to live on main level with bedroom/bathroom;Multi-level Home Equipment: Cane - single point;Rolling Walker (2 wheels);Shower seat - built in Additional Comments: set up for daughters home where she may discharge    Prior Function Prior Level of Function : Driving;Independent/Modified Independent             Mobility Comments: no AD at home, uses shopping cart for support at grocery store, driving ADLs Comments: independent in ADLs and iADLs.     Hand Dominance   Dominant Hand: Right    Extremity/Trunk Assessment   Upper Extremity Assessment Upper Extremity Assessment: Defer to OT evaluation    Lower Extremity Assessment Lower Extremity Assessment: Generalized weakness    Cervical / Trunk Assessment Cervical / Trunk Assessment: Other exceptions Cervical / Trunk Exceptions: icnreased body habitus  Communication   Communication: No difficulties  Cognition Arousal/Alertness: Awake/alert Behavior During Therapy: WFL for tasks assessed/performed Overall Cognitive Status: Within Functional Limits for tasks assessed                                 General Comments: verbose, self-distracting        General Comments General comments (skin integrity, edema, etc.): Pt orthostatic upon standing with report of dizziness BP 106/55 with c/o of dizziness 67/56, with sitting but suspect due to cuff 73/30, with sitting 5 min 90/53 HR 82 at rest max noted HR 123 bpm SpO2 >90%O2 on RA        Assessment/Plan    PT Assessment Patient needs continued PT services  PT Problem List Decreased strength;Decreased activity tolerance;Decreased balance;Decreased mobility;Cardiopulmonary status limiting activity;Decreased cognition       PT Treatment Interventions DME instruction;Gait training;Functional mobility training;Therapeutic activities;Balance training;Therapeutic exercise;Cognitive  remediation;Patient/family education    PT Goals (Current goals can be found in the Care Plan section)  Acute Rehab PT Goals Patient Stated Goal: have surgery PT Goal Formulation: With patient Time For Goal Achievement: 06/13/22 Potential to Achieve Goals: Fair    Frequency Min 2X/week        AM-PAC PT "6 Clicks" Mobility  Outcome Measure Help needed turning from your back to your side while in a flat bed without using bedrails?: A Little Help needed moving from lying on your back to sitting on the side of a flat bed without using bedrails?: A Little Help needed moving to and from a bed to a chair (including a wheelchair)?: A Little Help needed standing up from a chair using your arms (e.g., wheelchair or bedside chair)?: A Little Help needed to walk in hospital room?: A Lot Help needed climbing 3-5 steps with a railing? : Total 6 Click Score: 15    End of Session Equipment Utilized During Treatment: Gait belt Activity Tolerance: Treatment limited secondary to medical complications (Comment) (orthostatic hypotension) Patient left: in chair;with call bell/phone within reach;with chair alarm set Nurse Communication: Mobility status;Other (comment) (hypotension) PT Visit Diagnosis: Unsteadiness on feet (R26.81);Muscle weakness (generalized) (M62.81);Difficulty in walking, not elsewhere classified (R26.2);Dizziness and giddiness (R42);Pain Pain - part of body:  (abdomen)    Time: 8366-2947 PT Time Calculation (min) (ACUTE ONLY): 28 min   Charges:   PT Evaluation $PT Eval Moderate Complexity: 1 Mod  Izel Eisenhardt B. Migdalia Dk PT, DPT Acute Rehabilitation Services Please use secure chat or  Call Office [phone]   [place] 05/30/2022, 4:59 PM

## 2022-05-30 NOTE — Evaluation (Signed)
Occupational Therapy Evaluation Patient Details Name: Natasha Dickson MRN: [id number] DOB: 08/23/1945 Today's Date: 05/30/2022   History of Present Illness 76 y.o. female who presented to the ED 12/18 with hypotension and weakness. During work up found to have malignant completely obstructing tumor at cecum awaiting pathology before determining course of care.  PMH: A. Fib with RVR on Xarelto (last dose 12/18, currently on heparin gtt), CHF (30-35% 2014, now 55-60% in 04/2022), HTN, HLD, CKD2   Clinical Impression   Natasha Dickson was evaluated s/p the above admission list, she is typically indep at baseline and admits to a general decline over the past few weeks. Upon evaluation pt was limited by orthostatic hypotension with standing, generalized weakness, incontinent bladder, decreased balance and poor activity tolerance. Overall she needed min A fro bed mobility and min G for simple transfers with RW. Due to exacerbation of abdominal pain, she requires up to min A for LB ADLs. OT to continue to follow acutely. Recommend d/c to home with [place].   Supine: 106/55 Standing with c/o of dizziness: 67/56  Sitting but suspect due to ill fitting cuff: 73/30  (awake, alert, talking) Sitting with bari cuff after 5 min: 90/53   *HR 82 at rest max noted HR 123 bpm SpO2 >90%O2 on RA      Recommendations for follow up therapy are one component of a multi-disciplinary discharge planning process, led by the attending physician.  Recommendations may be updated based on patient status, additional functional criteria and insurance authorization.   Follow Up Recommendations  Home health OT     Assistance Recommended at Discharge Intermittent Supervision/Assistance  Patient can return home with the following A little help with walking and/or transfers;A little help with bathing/dressing/bathroom;Assistance with cooking/housework;Assist for transportation;Help with stairs or ramp for entrance    Functional Status  Assessment  Patient has had a recent decline in their functional status and demonstrates the ability to make significant improvements in function in a reasonable and predictable amount of time.  Equipment Recommendations  BSC/3in1       Precautions / Restrictions Precautions Precautions: Fall Precaution Comments: watch BP Restrictions Weight Bearing Restrictions: No      Mobility Bed Mobility Overal bed mobility: Needs Assistance Bed Mobility: Supine to Sit     Supine to sit: Min assist          Transfers Overall transfer level: Needs assistance Equipment used: Rolling walker (2 wheels) Transfers: Sit to/from Stand, Bed to chair/wheelchair/BSC Sit to Stand: Min guard     Step pivot transfers: Min guard     General transfer comment: limited by orthostatic hypotension      Balance Overall balance assessment: Needs assistance Sitting-balance support: Feet supported Sitting balance-Leahy Scale: Fair     Standing balance support: Single extremity supported, During functional activity Standing balance-Leahy Scale: Fair Standing balance comment: statically standing with only 1 UE supported                           ADL either performed or assessed with clinical judgement   ADL Overall ADL's : Needs assistance/impaired Eating/Feeding: Independent;Sitting Eating/Feeding Details (indicate cue type and reason): clear Grooming: Set up;Sitting   Upper Body Bathing: Set up;Sitting   Lower Body Bathing: Minimal assistance;Sit to/from stand   Upper Body Dressing : Set up;Sitting   Lower Body Dressing: Minimal assistance;Sit to/from stand Lower Body Dressing Details (indicate cue type and reason): able to don L sock with increased time,  exacerbated stomach pain Toilet Transfer: Min guard;Rolling walker (2 wheels);Stand-pivot   Toileting- Clothing Manipulation and Hygiene: Supervision/safety;Sitting/lateral lean       Functional mobility during ADLs: Min  guard General ADL Comments: limited by orthostatic hypotension     Vision Baseline Vision/History: 1 Wears glasses Vision Assessment?: No apparent visual deficits     Perception Perception Perception Tested?: No   Praxis Praxis Praxis tested?: Not tested    Pertinent Vitals/Pain Pain Assessment Pain Assessment: Faces Faces Pain Scale: Hurts a little bit Pain Location: R lower quadrant Pain Intervention(s): Limited activity within patient's tolerance, Monitored during session     Hand Dominance Right   Extremity/Trunk Assessment Upper Extremity Assessment Upper Extremity Assessment: Generalized weakness   Lower Extremity Assessment Lower Extremity Assessment: Defer to PT evaluation   Cervical / Trunk Assessment Cervical / Trunk Assessment: Other exceptions Cervical / Trunk Exceptions: icnreased body habitus   Communication Communication Communication: No difficulties   Cognition Arousal/Alertness: Awake/alert Behavior During Therapy: WFL for tasks assessed/performed Overall Cognitive Status: Within Functional Limits for tasks assessed                                 General Comments: verbose, self-distracting     General Comments  Pt orthostatic upon standing with report of dizziness     Home Living Family/patient expects to be discharged to:: Private residence Living Arrangements: Spouse/significant other;Children Available Help at Discharge: Available 24 hours/day Type of Home: House Home Access: Stairs to enter Technical brewer of Steps: 20 Entrance Stairs-Rails: None Home Layout: Able to live on main level with bedroom/bathroom;Multi-level Alternate Level Stairs-Number of Steps: 12   Bathroom Shower/Tub: Occupational psychologist: Standard Bathroom Accessibility: Yes   Home Equipment: Cane - single Barista (2 wheels);Shower seat - built in   Additional Comments: set up for Natasha Dickson home where she may  discharge      Prior Functioning/Environment Prior Level of Function : Driving;Independent/Modified Independent             Mobility Comments: no AD at home, uses shopping cart for support at grocery store, driving ADLs Comments: independent in ADLs and iADLs.        OT Problem List: Decreased strength;Decreased activity tolerance;Decreased range of motion;Impaired balance (sitting and/or standing);Decreased safety awareness;Decreased knowledge of precautions;Decreased knowledge of use of DME or AE;Pain      OT Treatment/Interventions: Self-care/ADL training;Therapeutic exercise;DME and/or AE instruction;Therapeutic activities;Patient/family education;Balance training    OT Goals(Current goals can be found in the care plan section) Acute Rehab OT Goals Patient Stated Goal: to go home OT Goal Formulation: With patient Time For Goal Achievement: 06/13/22 Potential to Achieve Goals: Good ADL Goals Pt Will Perform Grooming: with modified independence;standing Pt Will Perform Lower Body Dressing: with modified independence;sit to/from stand Pt Will Transfer to Toilet: with modified independence;ambulating Additional ADL Goal #1: Pt will complete at least 8 minutes of OOB functional tasks to demonstrate increased activity tolerance Additional ADL Goal #2: Pt will indep complete IADL medication management task  OT Frequency: Min 2X/week       AM-PAC OT "6 Clicks" Daily Activity     Outcome Measure Help from another person eating meals?: None Help from another person taking care of personal grooming?: A Little Help from another person toileting, which includes using toliet, bedpan, or urinal?: A Little Help from another person bathing (including washing, rinsing, drying)?: A Little Help from another person  to put on and taking off regular upper body clothing?: None Help from another person to put on and taking off regular lower body clothing?: A Little 6 Click Score: 20   End of  Session Equipment Utilized During Treatment: Gait belt;Rolling walker (2 wheels) Nurse Communication: Mobility status  Activity Tolerance: Patient tolerated treatment well Patient left: in chair;with call bell/phone within reach;with chair alarm set  OT Visit Diagnosis: Unsteadiness on feet (R26.81);Other abnormalities of gait and mobility (R26.89);Muscle weakness (generalized) (M62.81);Pain                Time: 3267-1245 OT Time Calculation (min): 38 min Charges:  OT General Charges $OT Visit: 1 Visit OT Evaluation $OT Eval Moderate Complexity: 1 Mod OT Treatments $Self Care/Home Management : 8-22 mins   Elliot Cousin 05/30/2022, 11:32 AM

## 2022-05-30 NOTE — Progress Notes (Signed)
IVT consult placed for PIV placement due to infiltration of amio. Amio was infusing through right hand. Patient had USG PIV placed yesterday however heparin is infusing through this PIV. Instructed RN that all pressors need to be infused through USG PIV moving forward with an iWatch in place.   Since patient is likely going to be hospitalized for a few more days, highly recommend the insertion of PICC line to prevent further infiltrations and preserve peripheral vasculature. Patient has had numerous PIV's this hospitalization and family is interested in PICC placement. IVT assessed patient previously and recommended PICC 12/20. Message sent to Dr. Bonner Puna this evening asking for consideration of PICC on 12/22; no response at this time.   Teryl Mcconaghy Lorita Officer, RN

## 2022-05-30 NOTE — Progress Notes (Addendum)
Attending physician's note   I have taken a history, reviewed the chart, and examined the patient. I performed a substantive portion of this encounter, including complete performance of at least one of the key components, in conjunction with the APP. I agree with the APP's note, impression, and recommendations with my edits.   Colonoscopy yesterday with mass in the cecum and ileocecal valve.  Biopsies pending.  3 smaller polyps removed with cold snare.  All adenomatous appearing without high risk features.  CT A/P with 4.5 x 2.6 cm mass corresponding to colonoscopy findings, along with a few subcentimeter mesenteric lymph nodes.  Will defer whether or not to proceed with PET/CT to the surgical service.  Otherwise, tentative plan for OR tomorrow for surgical resection.  Inpatient GI service will sign off at this time.  Please do not hesitate to contact me with additional questions or concerns.  [address], DO, FACG [phone] office    Addendum: I spoke with Dr. Vic Ripper in Pathology, and cecal mass confirmed as Adenocarcinoma and polyps were all adenomas w/o HGD or advanced features.          Progress Note   Subjective  Chief Complaint: Microcytic anemia, FOBT positive  Status post EGD/colonoscopy 05/29/2022.  Colonoscopy with fair preparation of the colon, malignant completely obstructing tumor in the cecum and at the ileocecal valve biopsied and tattooed as well as to 4-5 mm polyps in descending colon and transverse colon and one 8 mm polyp in the sigmoid colon with diverticulosis in the sigmoid colon nonbleeding internal hemorrhoids.  EGD with 3 cm hiatal hernia and gastritis and otherwise normal other than a few gastric polyps.  Today, the patient tells me that she saw surgery yesterday and they are just waiting, apparently she has been given a tentative surgical date of Friday a week from now?  In general she feels like her abdomen is sort of "touchy", and tender to  palpation in some spots.  Mostly she discusses the fact that they had to change her IV insertions to the other arm and the fact that she does not like the meals around here.  No new complaints or concerns.   Objective   Vital signs in last 24 hours: Temp:  [97.2 F (36.2 C)-99.1 F (37.3 C)] 99.1 F (37.3 C) (12/21 0622) Pulse Rate:  [90-122] 90 (12/21 0846) Resp:  [18-28] 18 (12/21 0622) BP: (90-111)/(45-67) 106/52 (12/21 0846) SpO2:  [85 %-100 %] 94 % (12/21 0622) Last BM Date : 05/29/22 General:    Elderly white female in NAD Heart:  Regular rate and rhythm; no murmurs Lungs: Respirations even and unlabored, lungs CTA bilaterally Abdomen:  Soft, moderate generalized TTP and nondistended. Normal bowel sounds. Psych:  Cooperative. Normal mood and affect.  Intake/Output from previous day: 12/20 0701 - 12/21 0700 In: 1258.2 [P.O.:120; I.V.:868.2; IV Piggyback:270] Out: -    Lab Results: Recent Labs    05/28/22 0120 05/28/22 0918 05/28/22 1944 05/29/22 0144 05/30/22 0227  WBC 7.6  --   --  10.9* 10.1  HGB 8.0*   < > 9.3* 8.5* 8.2*  HCT 27.2*   < > 30.6* 28.9* 27.8*  PLT 403*  --   --  451* 425*   < > = values in this interval not displayed.   BMET Recent Labs    05/27/22 1143 05/28/22 0120 05/30/22 0227  NA 136 136 134*  K 3.0* 4.4 4.2  CL 100 105 107  CO2 23 21*  19*  GLUCOSE 105* 98 79  BUN 15 13 7*  CREATININE 1.22* 1.13* 1.10*  CALCIUM 9.6 8.7* 8.5*   LFT Recent Labs    05/28/22 0120  PROT 5.6*  ALBUMIN 2.6*  AST 17  ALT 7  ALKPHOS 76  BILITOT 0.6   Studies/Results: CT ABDOMEN PELVIS W CONTRAST  Result Date: 05/29/2022 CLINICAL DATA:  Colon carcinoma staging EXAM: CT ABDOMEN AND PELVIS WITH CONTRAST TECHNIQUE: Multidetector CT imaging of the abdomen and pelvis was performed using the standard protocol following bolus administration of intravenous contrast. RADIATION DOSE REDUCTION: This exam was performed according to the departmental  dose-optimization program which includes automated exposure control, adjustment of the mA and/or kV according to patient size and/or use of iterative reconstruction technique. CONTRAST:  138m OMNIPAQUE IOHEXOL 350 MG/ML SOLN COMPARISON:  None Available. FINDINGS: Lower chest: Heart is enlarged in size. Scattered coronary artery calcifications are seen. There are small linear densities in the lower lung fields suggesting minimal scarring or subsegmental atelectasis. There is no focal consolidation in the lower lung fields. There is reconstruction prosthesis in left breast. Hepatobiliary: No focal abnormalities are seen in liver. There is no dilation of bile ducts. There are large gallbladder stones. There is no wall thickening in gallbladder. There is no fluid around the gallbladder. Pancreas: No focal abnormalities are seen. Spleen: Unremarkable. Adrenals/Urinary Tract: There is mild hyperplasia of adrenals without discrete nodules. There is no hydronephrosis. There is 5 mm calculus in the lower pole right kidney. Ureters are not dilated. Urinary bladder is unremarkable. Stomach/Bowel: Small hiatal hernia is seen. Stomach is not distended. There is no significant small bowel dilation. Appendix is not seen. There is 4.5 x 2.6 cm asymmetric mass lesion in the cecum close to the ileocolic junction. This lesion has to be considered a malignant neoplasm. There is mild asymmetric wall thickening in hepatic flexure measuring 2.2 cm in length and 6 mm in thickness. Scattered diverticula are seen in colon without signs of focal acute diverticulitis. There is no pericolic stranding. Vascular/Lymphatic: Scattered arterial calcifications are seen in aorta and its major branches. There are few subcentimeter mesenteric nodes. In view of the mass seen in cecum, follow-up PET-CT may be considered for further characterization of the slightly enlarged mesenteric lymph nodes. Reproductive: Unremarkable. Other: There is no ascites or  pneumoperitoneum. Umbilical hernia containing fat is seen. Maximum diameter of hernial sac measures 5.8 cm. Musculoskeletal: There is 50% decrease in height of body of L1 vertebra, most likely old fracture. There is mild retropulsion in the upper endplate of body of L1 vertebra. There is 30% decrease in height of upper endplate of body of L3 vertebra. This may be recent or old. There is previous right hip arthroplasty. IMPRESSION: There is 4.5 x 2.6 cm asymmetric wall thickening in cecum close to the ileocecal junction. This lesion has to be considered a malignant neoplasm. There are few subcentimeter mesenteric lymph nodes which may suggest incidental benign reactive hyperplasia. In view of the mass lesion in the cecum, PET-CT may be considered for further characterization of mesenteric nodes. There is small focus of asymmetric wall thickening in hepatic flexure which may be an artifact due to incomplete distention or suggest neoplastic process. Please correlate with endoscopic findings. No focal abnormalities are seen in liver. 50% decrease in height of body of L1 vertebra most likely is old compression fracture. There is 30% decrease in height of upper endplate of body of L3 vertebra which may be recent or old fracture. If  there are focal symptoms, follow-up MRI may be considered. There is no evidence of intestinal obstruction or pneumoperitoneum. There is no hydronephrosis. Large gallbladder stones without signs of acute cholecystitis or dilation of bile ducts. Small hiatal hernia. Diverticulosis of colon. Nonobstructing right renal calculus. There is moderate sized umbilical hernia containing fat. Aortic arteriosclerosis. Coronary artery calcifications are seen. Other findings as described in the body of the report. Electronically Signed   By: Elmer Picker M.D.   On: 05/29/2022 20:08       Assessment / Plan:   Assessment: 1.  Microcytic anemia with FOBT positive stools: EGD/colonoscopy 12/20,  colonoscopy with a cecal mass, CT as above, pathology pending, CEA pending 2.  Nonischemic cardiomyopathy 3.  A-fib with RVR: Xarelto on hold 4.  Family history of colon cancer in her father  Plan: 1.  Appreciate surgical team's opinion, currently awaiting pathology from procedures yesterday. 2.  Continue to monitor hemoglobin with transfusion as needed 3.  We may sign off today  Thank you for your kind consultation.   LOS: 3 days   Levin Erp  05/30/2022, 10:54 AM

## 2022-05-31 ENCOUNTER — Inpatient Hospital Stay: Payer: Self-pay

## 2022-05-31 ENCOUNTER — Other Ambulatory Visit: Payer: Self-pay

## 2022-05-31 ENCOUNTER — Encounter (HOSPITAL_COMMUNITY)
Admission: EM | Disposition: A | Discharge status: Skilled Nursing Facility | Payer: Self-pay | Source: Home / Self Care | Attending: Family Medicine

## 2022-05-31 DIAGNOSIS — K6389 Other specified diseases of intestine: Secondary | ICD-10-CM | POA: Diagnosis not present

## 2022-05-31 DIAGNOSIS — E538 Deficiency of other specified B group vitamins: Secondary | ICD-10-CM | POA: Diagnosis not present

## 2022-05-31 DIAGNOSIS — Z87442 Personal history of urinary calculi: Secondary | ICD-10-CM

## 2022-05-31 DIAGNOSIS — R195 Other fecal abnormalities: Secondary | ICD-10-CM | POA: Diagnosis not present

## 2022-05-31 DIAGNOSIS — I4891 Unspecified atrial fibrillation: Secondary | ICD-10-CM | POA: Diagnosis not present

## 2022-05-31 DIAGNOSIS — I5032 Chronic diastolic (congestive) heart failure: Secondary | ICD-10-CM | POA: Diagnosis not present

## 2022-05-31 HISTORY — DX: Personal history of urinary calculi: Z87.442

## 2022-05-31 LAB — CBC
HCT: 25.7 % — ABNORMAL LOW (ref 36.0–46.0)
Hemoglobin: 7.9 g/dL — ABNORMAL LOW (ref 12.0–15.0)
MCH: 23.7 pg — ABNORMAL LOW (ref 26.0–34.0)
MCHC: 30.7 g/dL (ref 30.0–36.0)
MCV: 76.9 fL — ABNORMAL LOW (ref 80.0–100.0)
Platelets: 380 10*3/uL (ref 150–400)
RBC: 3.34 MIL/uL — ABNORMAL LOW (ref 3.87–5.11)
RDW: 16.8 % — ABNORMAL HIGH (ref 11.5–15.5)
WBC: 8.4 10*3/uL (ref 4.0–10.5)
nRBC: 0.4 % — ABNORMAL HIGH (ref 0.0–0.2)

## 2022-05-31 LAB — BASIC METABOLIC PANEL
Anion gap: 9 (ref 5–15)
BUN: 9 mg/dL (ref 8–23)
CO2: 20 mmol/L — ABNORMAL LOW (ref 22–32)
Calcium: 8.6 mg/dL — ABNORMAL LOW (ref 8.9–10.3)
Chloride: 109 mmol/L (ref 98–111)
Creatinine, Ser: 1.15 mg/dL — ABNORMAL HIGH (ref 0.44–1.00)
GFR, Estimated: 49 mL/min — ABNORMAL LOW (ref >60)
Glucose, Bld: 79 mg/dL (ref 70–99)
Potassium: 3.6 mmol/L (ref 3.5–5.1)
Sodium: 138 mmol/L (ref 135–145)

## 2022-05-31 LAB — CEA
CEA: 3.6 ng/mL (ref 0.0–4.7)
CEA: 4.1 ng/mL (ref 0.0–4.7)

## 2022-05-31 LAB — PREPARE RBC (CROSSMATCH)

## 2022-05-31 LAB — SURGICAL PATHOLOGY

## 2022-05-31 LAB — TSH: TSH: 5.363 u[IU]/mL — ABNORMAL HIGH (ref 0.350–4.500)

## 2022-05-31 SURGERY — COLECTOMY, RIGHT, LAPAROSCOPIC
Anesthesia: General

## 2022-05-31 MED ORDER — SODIUM CHLORIDE 0.9% FLUSH: 10.0000 mL | INTRAVENOUS | Status: DC | PRN

## 2022-05-31 MED ORDER — AMIODARONE HCL 200 MG PO TABS
200.0000 mg | ORAL_TABLET | Freq: Two times a day (BID) | ORAL | Status: DC
  Administered 2022-05-31 – 2022-06-12 (×24): 200 mg via ORAL
  Filled 2022-05-31 (×25): qty 1

## 2022-05-31 MED ORDER — CHLORHEXIDINE GLUCONATE CLOTH 2 % EX PADS
6.0000 | MEDICATED_PAD | Freq: Every day | CUTANEOUS | Status: DC
  Administered 2022-05-31 – 2022-06-11 (×13): 6 via TOPICAL

## 2022-05-31 MED ORDER — ENSURE ENLIVE PO LIQD
237.0000 mL | Freq: Three times a day (TID) | ORAL | Status: DC
  Administered 2022-05-31 – 2022-06-12 (×22): 237 mL via ORAL
  Filled 2022-05-31 (×2): qty 237

## 2022-05-31 MED ORDER — SODIUM CHLORIDE 0.9% FLUSH
10.0000 mL | Freq: Two times a day (BID) | INTRAVENOUS | Status: DC
  Administered 2022-05-31 – 2022-06-01 (×3): 10 mL
  Administered 2022-06-01: 20 mL
  Administered 2022-06-02 – 2022-06-04 (×6): 10 mL
  Administered 2022-06-05: 20 mL
  Administered 2022-06-05 – 2022-06-07 (×5): 10 mL
  Administered 2022-06-08: 20 mL
  Administered 2022-06-08: 10 mL
  Administered 2022-06-09: 20 mL
  Administered 2022-06-09: 10 mL
  Administered 2022-06-10: 20 mL

## 2022-05-31 MED ORDER — SODIUM CHLORIDE 0.9% IV SOLUTION: Freq: Once | INTRAVENOUS | Status: AC

## 2022-05-31 NOTE — Progress Notes (Signed)
Mobility Specialist - Progress Note   05/31/22 1546  Mobility  Activity Moved into chair position in bed  Activity Response Tolerated well  Mobility Referral Yes  $Mobility charge 1 Mobility   Pt received in bed. Pt refused getting up to chair but agreeable to chair position in bed. No complaints throughout. Pt was left with all needs met.  Franki Monte  Mobility Specialist Please contact via Solicitor or Rehab office at [phone]

## 2022-05-31 NOTE — Progress Notes (Signed)
Natasha Dickson  2 Days Post-Op  Subjective: Denies any current abdominal pain, nausea, vomiting. She denies any pain. +flatus today and BM overnight. Denies any nausea or vomiting and she is tolerating clear liquids.  Objective: Vital signs in last 24 hours: Temp:  [97.5 F (36.4 C)-98.7 F (37.1 C)] 98.4 F (36.9 C) (12/22 0813) Pulse Rate:  [74-97] 94 (12/22 0813) Resp:  [18-19] 19 (12/22 0350) BP: (91-115)/(33-61) 101/56 (12/22 0813) SpO2:  [92 %-98 %] 96 % (12/22 0813) Last BM Date : 05/30/22  Intake/Output from previous day: 12/21 0701 - 12/22 0700 In: 2036.1 [I.V.:2036.1] Out: 300 [Urine:300] Intake/Output this shift: No intake/output data recorded.  PE: Gen:  Alert, NAD, pleasant Card:  irregular, but in 80-90s Pulm: rate and effort normal Abd: obese, soft, ND, NT  Lab Results:  Recent Labs    05/30/22 0227 05/31/22 0146  WBC 10.1 8.4  HGB 8.2* 7.9*  HCT 27.8* 25.7*  PLT 425* 380   BMET Recent Labs    05/30/22 0227 05/31/22 0146  NA 134* 138  K 4.2 3.6  CL 107 109  CO2 19* 20*  GLUCOSE 79 79  BUN 7* 9  CREATININE 1.10* 1.15*  CALCIUM 8.5* 8.6*   PT/INR No results for input(s): "LABPROT", "INR" in the last 72 hours. CMP     Component Value Date/Time   NA 138 05/31/2022 0146   NA 141 10/11/2021 1136   K 3.6 05/31/2022 0146   CL 109 05/31/2022 0146   CO2 20 (L) 05/31/2022 0146   GLUCOSE 79 05/31/2022 0146   BUN 9 05/31/2022 0146   BUN 14 10/11/2021 1136   CREATININE 1.15 (H) 05/31/2022 0146   CREATININE 0.99 03/11/2016 0911   CALCIUM 8.6 (L) 05/31/2022 0146   PROT 5.6 (L) 05/28/2022 0120   PROT 7.1 07/04/2020 1041   ALBUMIN 2.6 (L) 05/28/2022 0120   ALBUMIN 4.4 07/04/2020 1041   AST 17 05/28/2022 0120   ALT 7 05/28/2022 0120   ALKPHOS 76 05/28/2022 0120   BILITOT 0.6 05/28/2022 0120   BILITOT 1.1 07/04/2020 1041   GFRNONAA 49 (L) 05/31/2022 0146   GFRAA 55 (L) 07/04/2020 1041   Lipase  No results found  for: "LIPASE"     Studies/Results: [place] EKG SITE RITE  Result Date: 05/31/2022 If Site Rite image not attached, placement could not be confirmed due to current cardiac rhythm.  CT ABDOMEN PELVIS W CONTRAST  Result Date: 05/29/2022 CLINICAL DATA:  Colon carcinoma staging EXAM: CT ABDOMEN AND PELVIS WITH CONTRAST TECHNIQUE: Multidetector CT imaging of the abdomen and pelvis was performed using the standard protocol following bolus administration of intravenous contrast. RADIATION DOSE REDUCTION: This exam was performed according to the departmental dose-optimization program which includes automated exposure control, adjustment of the mA and/or kV according to patient size and/or use of iterative reconstruction technique. CONTRAST:  123m OMNIPAQUE IOHEXOL 350 MG/ML SOLN COMPARISON:  None Available. FINDINGS: Lower chest: Heart is enlarged in size. Scattered coronary artery calcifications are seen. There are small linear densities in the lower lung fields suggesting minimal scarring or subsegmental atelectasis. There is no focal consolidation in the lower lung fields. There is reconstruction prosthesis in left breast. Hepatobiliary: No focal abnormalities are seen in liver. There is no dilation of bile ducts. There are large gallbladder stones. There is no wall thickening in gallbladder. There is no fluid around the gallbladder. Pancreas: No focal abnormalities are seen. Spleen: Unremarkable. Adrenals/Urinary Tract: There is mild hyperplasia of adrenals without  discrete nodules. There is no hydronephrosis. There is 5 mm calculus in the lower pole right kidney. Ureters are not dilated. Urinary bladder is unremarkable. Stomach/Bowel: Small hiatal hernia is seen. Stomach is not distended. There is no significant small bowel dilation. Appendix is not seen. There is 4.5 x 2.6 cm asymmetric mass lesion in the cecum close to the ileocolic junction. This lesion has to be considered a malignant neoplasm. There is  mild asymmetric wall thickening in hepatic flexure measuring 2.2 cm in length and 6 mm in thickness. Scattered diverticula are seen in colon without signs of focal acute diverticulitis. There is no pericolic stranding. Vascular/Lymphatic: Scattered arterial calcifications are seen in aorta and its major branches. There are few subcentimeter mesenteric nodes. In view of the mass seen in cecum, follow-up PET-CT may be considered for further characterization of the slightly enlarged mesenteric lymph nodes. Reproductive: Unremarkable. Other: There is no ascites or pneumoperitoneum. Umbilical hernia containing fat is seen. Maximum diameter of hernial sac measures 5.8 cm. Musculoskeletal: There is 50% decrease in height of body of L1 vertebra, most likely old fracture. There is mild retropulsion in the upper endplate of body of L1 vertebra. There is 30% decrease in height of upper endplate of body of L3 vertebra. This may be recent or old. There is previous right hip arthroplasty. IMPRESSION: There is 4.5 x 2.6 cm asymmetric wall thickening in cecum close to the ileocecal junction. This lesion has to be considered a malignant neoplasm. There are few subcentimeter mesenteric lymph nodes which may suggest incidental benign reactive hyperplasia. In view of the mass lesion in the cecum, PET-CT may be considered for further characterization of mesenteric nodes. There is small focus of asymmetric wall thickening in hepatic flexure which may be an artifact due to incomplete distention or suggest neoplastic process. Please correlate with endoscopic findings. No focal abnormalities are seen in liver. 50% decrease in height of body of L1 vertebra most likely is old compression fracture. There is 30% decrease in height of upper endplate of body of L3 vertebra which may be recent or old fracture. If there are focal symptoms, follow-up MRI may be considered. There is no evidence of intestinal obstruction or pneumoperitoneum. There is  no hydronephrosis. Large gallbladder stones without signs of acute cholecystitis or dilation of bile ducts. Small hiatal hernia. Diverticulosis of colon. Nonobstructing right renal calculus. There is moderate sized umbilical hernia containing fat. Aortic arteriosclerosis. Coronary artery calcifications are seen. Other findings as described in the body of the report. Electronically Signed   By: Elmer Picker M.D.   On: 05/29/2022 20:08    Anti-infectives: Anti-infectives (From admission, onward)    Start     Dose/Rate Route Frequency Ordered Stop   05/31/22 0600  cefoTEtan (CEFOTAN) 2 g in sodium chloride 0.9 % 100 mL IVPB        2 g 200 mL/hr over 30 Minutes Intravenous On call to O.R. 05/30/22 1716 06/01/22 0559   05/30/22 2200  neomycin (MYCIFRADIN) tablet 1,000 mg       See Hyperspace for full Linked Orders Report.   1,000 mg Oral 3 times per day 05/30/22 1716 05/30/22 2238   05/30/22 2200  metroNIDAZOLE (FLAGYL) tablet 1,000 mg       See Hyperspace for full Linked Orders Report.   1,000 mg Oral 3 times per day 05/30/22 1716 05/30/22 2128        Assessment/Plan Colon Mass, adenocarcinoma from surgical path  - Colonoscopy 12/20 showed completely obstructing tumor  in the cecum and at the ileocecal valve, along with polyps of the transverse, descending, and sigmoid colon -polyps are all benign except mass at ICV with adenocarcinoma - CEA normal at 4.1. - CT chest w/o evidence of mets. CT a/p subcentimeter mesenteric lymph nodes, no abnormalities seen in the liver - Cardiology following and recs No further CV work-up needed prior to OR, revised cardiac risk index 10.1%   Husband and daughter, Margaretha Sheffield, were at the bedside during my visit.  I was with them for about 1.5 hours discussing possible surgery, expectations, explanations, possible risks, and other questions regarding her surgical readiness from a heart and medical standpoint.  I answered all of their questions to the best of  my ability with the information I have at this time.  They would prefer NOT to proceed with surgery until Tuesday after the holiday weekend.  They have discussed needing some blood with her hgb trending down to 7.9.  Dr. Bonner Puna joined for a portion of the conversation.  He would like to give her 1 unit which we are just fine with.  She will have a T&S prior to surgery as well.  She is tolerating CLD.  The family does not want to advance her past this.  I will add Ensure to help boost her nutrition during this waiting period.  Given she can tolerate some orals, would hold on TNA given enteral nutrition is superior, TNA has it's own risks, and her fluid status can be tenuous.  I drew pictures to discuss the expected procedure but discussed that this can change if something unplanned happens in the OR.  The husband is adamant that the patient not sign consent herself due to a family concern for worsening dementia from the patient.  The patient currently is A&Ox3 and understands our conversation.  We discussed that it is ok for the patient to sign but for him to co-sign with her being her next of kin.  The patient was agreeable to that.  Patient also agreeable for all information to be discussed with her husband and daughters.  As of right now, we can tentatively plan for surgery next Tuesday as scheduling allows.  NSQIP risk calculator completed and taken to the patient's room for the family to review.    FEN - CLD, Ensure VTE - SCDs, heparin gtt on hold currently ID - None   A. Fib on Xarelto (last dose 12/18, currently on heparin gtt, but held) CHF (30-35% 2014, now 55-60% in 04/2022) HTN HLD CKD2   ACS RISK CALCULATOR USE:  Risk Calculator was used for discussion of surgery: Yes      I reviewed Consultant cardiology, GI notes, last 24 h vitals and pain scores, last 48 h intake and output, last 24 h labs and trends, and last 24 h imaging results.    LOS: 4 days    Henreitta Cea, [facility] 05/31/2022, 12:35 PM Please see Amion for pager number during day hours 7:00am-4:30pm

## 2022-05-31 NOTE — Care Management Important Message (Signed)
Important Message  Patient Details  Name: Natasha Dickson MRN: [id number] Date of Birth: Nov 02, 1945   Medicare Important Message Given:  Yes     Shelda Altes 05/31/2022, 9:00 AM

## 2022-05-31 NOTE — Progress Notes (Signed)
[facility] PROGRESS NOTE  Natasha Dickson (DOB: [birth year]) YWV:[id number] PCP: Tonia Ghent, MD  Brief Narrative: Natasha Dickson is a 76 y.o. female with medical history significant of atrial fibrillation on xarelto, systolic HF (recovered EF), non ischemic cardiomyopathy, hypertension and multiple other medical issues here with progressive generalized weakness.  Found to have new anemia and + FOBT as well as AFib with RVR. She was admitted with cardiology consultations. GI took for EGD/colonoscopy 12/20 which showed a "malignant completely obstructing tumor in the cecum and at the ileocecal valve," which has been found to be invasive adenocarcinoma. There were polyps in the transverse, descending, and sigmoid colon which were also sent to pathology ultimately ruling out for malignancy. CT abd/pelvis showed mesenteric LNs, no liver lesions. Surgery is consulted for colectomy. Rate control has been complicated by hypotension for which cardiology is consulted. This has improved. She has received IV iron and will receive 1u PRBCs 12/20 for progressive anemia.  Subjective: Had BM 12/21. No abdominal pain anymore at this time. No vomiting. Has been lightheaded when standing. Family at bedside helpful with history. Pt has clear evidence of early dementia preceding hospitalization and has had waxing/waning forgetfulness during hospitalization.   Objective: BP (!) 101/56 (BP Location: Right Arm)   Pulse 94   Temp 98.4 F (36.9 C) (Oral)   Resp 19   Ht '5\' 1"'$  (1.549 m)   Wt 93 kg   SpO2 96%   BMI 38.74 kg/m   Gen: No distress Pulm: Clear anterolaterally and nonlabored  CV: Irreg irreg with rates in 80's, no MRG, no pitting edema GI: Soft, NT, ND, +BS. Not tender in RLQ like yesterday. Neuro: Alert and oriented. No new focal deficits. Ext: Warm, no deformities Skin: No rashes, lesions or ulcers on visualized skin   Assessment & Plan: Invasive moderately differentiated adenocarcinoma of  the cecum: Other polyps tested negative for malignancy. CT abd/pelvis staging scan showed subcentimeter mesenteric LNs and 4.5cm x 2.6cm thickening of cecum close to ileocecal junction. No hepatic abnormalities. CEA oddly only 3.6 > 4.1.  - PT and family have discussed with surgery, medicine team, and cardiology team multidisciplinary at the bedside 12/22. They know of significant risk of morbidity and mortality with surgery as well as suspected inevitable progression of the mass leading to obstruction and continued bleeding. ACS risk calculator will be used to quantify and specify risks per general surgery.  - I will order CLD for now, but defer diet to surgery who is currently planning on hemicolectomy 12/26 or will be on call if urgently needed. - Note no further work up recommended by cardiology, RCRI 10.1%.  - Does not have an acute abdomen at this time. Will continue close monitoring.   Mild cognitive impairment: Based on symptom report prior to hospitalization. The patient does have capacity to make most medical decisions most of the time, but this is not guaranteed with hospital delirium.  - Any procedures or interventions requiring informed consent should include discussion with the patient's daughter and/or husband.  Chronic combined HFrEF with recovered EF, HTN: Appears roughly euvolemic.  - Metoprolol on hold due to soft BP per cardiology - Soft BP limiting other GDMT at this time. Holding home BP meds.  - Coronary calcifications noted on scans, though deferring ischemic evaluation at this time.  Persistent AFib with RVR:  - CHA2DS2-VASc score is 5, though pt has known active bleeding source, hypotension, and transfusion-dependent anemia. Will hold anticoagulation for the time being.  -  Rates fairly controlled but soft BP. Digoxin per cardiology.  - Continue amiodarone, planning to convert IV > PO.   Symptomatic iron deficiency, acute on chronic blood loss anemia: Ferritin is 7, iron  15, 3% sat, TIBC up to 535. Suspect thrombocytosis is reactive to this. - IV iron. - Hgb has only gone down and we have the source identified. Until this is addressed, can reasonably expect continued worsening. In light of her hypotension, upcoming surgery, and CAD (coronary calcifications seen on scan), will order 1u PRBC. Pt/family consent.   Vitamin B12 deficiency: Level is 166. - IM supplement for now.   HLD: CK low.  - Statin  Stage IIIa CKD: Based on available CrCl values.  - Avoid nephrotoxins as able. CrCl holding stable. I have discussed the risk of vascular stenosis with PICC which the patient has required in the past, namely the limitation on dialysis access that could result. The patient and the family are aware and opt to move forward with PICC line. The patient has very poor IV access, will be admitted for at least another week and has intermittently required infusions, will have surgery.  Obesity: Body mass index is 38.74 kg/m.   Hypokalemia: Resolved  [name], MD [facility] [email or web address] 05/31/2022, 12:39 PM

## 2022-05-31 NOTE — Plan of Care (Signed)
  Problem: Education: Goal: Knowledge of General Education information will improve Description: Including pain rating scale, medication(s)/side effects and non-pharmacologic comfort measures Outcome: Progressing   Problem: Health Behavior/Discharge Planning: Goal: Ability to manage health-related needs will improve Outcome: Progressing   Problem: Clinical Measurements: Goal: Ability to maintain clinical measurements within normal limits will improve Outcome: Progressing Goal: Will remain free from infection Outcome: Progressing Goal: Diagnostic test results will improve Outcome: Progressing Goal: Respiratory complications will improve Outcome: Progressing Goal: Cardiovascular complication will be avoided Outcome: Progressing   Problem: Activity: Goal: Risk for activity intolerance will decrease Outcome: Progressing   Problem: Nutrition: Goal: Adequate nutrition will be maintained Outcome: Progressing   Problem: Coping: Goal: Level of anxiety will decrease Outcome: Progressing   Problem: Elimination: Goal: Will not experience complications related to bowel motility Outcome: Progressing Goal: Will not experience complications related to urinary retention Outcome: Progressing   Problem: Pain Managment: Goal: General experience of comfort will improve Outcome: Progressing   Problem: Safety: Goal: Ability to remain free from injury will improve Outcome: Progressing   Problem: Skin Integrity: Goal: Risk for impaired skin integrity will decrease Outcome: Progressing   Problem: Education: Goal: Understanding of discharge needs will improve Outcome: Progressing Goal: Verbalization of understanding of the causes of altered bowel function will improve Outcome: Progressing   Problem: Activity: Goal: Ability to tolerate increased activity will improve Outcome: Progressing   Problem: Bowel/Gastric: Goal: Gastrointestinal status for postoperative course will  improve Outcome: Progressing   Problem: Health Behavior/Discharge Planning: Goal: Identification of community resources to assist with postoperative recovery needs will improve Outcome: Progressing   Problem: Nutritional: Goal: Will attain and maintain optimal nutritional status will improve Outcome: Progressing   Problem: Clinical Measurements: Goal: Postoperative complications will be avoided or minimized Outcome: Progressing   Problem: Respiratory: Goal: Respiratory status will improve Outcome: Progressing   Problem: Skin Integrity: Goal: Will show signs of wound healing Outcome: Progressing   

## 2022-05-31 NOTE — Progress Notes (Signed)
Peripherally Inserted Central Catheter Placement  The IV Nurse has discussed with the patient and/or persons authorized to consent for the patient, the purpose of this procedure and the potential benefits and risks involved with this procedure.  The benefits include less needle sticks, lab draws from the catheter, and the patient may be discharged home with the catheter. Risks include, but not limited to, infection, bleeding, blood clot (thrombus formation), and puncture of an artery; nerve damage and irregular heartbeat and possibility to perform a PICC exchange if needed/ordered by physician.  Alternatives to this procedure were also discussed.  Bard Power PICC patient education guide, fact sheet on infection prevention and patient information card has been provided to patient /or left at bedside.    PICC Placement Documentation  PICC Double Lumen 05/31/22 Right Brachial 39 cm 0 cm (Active)  Indication for Insertion or Continuance of Line Poor Vasculature-patient has had multiple peripheral attempts or PIVs lasting less than 24 hours 05/31/22 1353  Exposed Catheter (cm) 0 cm 05/31/22 1353  Site Assessment Clean, Dry, Intact 05/31/22 1353  Lumen #1 Status Flushed;Saline locked;Blood return noted 05/31/22 1353  Lumen #2 Status Flushed;Saline locked;Blood return noted 05/31/22 1353  Dressing Type Transparent;Securing device 05/31/22 1353  Dressing Status Antimicrobial disc in place 05/31/22 1353  Safety Lock Not Applicable 18/33/58 2518  Dressing Change Due 06/07/22 05/31/22 [place] 05/31/2022, 1:57 PM

## 2022-05-31 NOTE — Progress Notes (Addendum)
Rounding Note    Patient Name: Natasha Dickson Date of Encounter: 05/31/2022  [facility] Cardiologist: Freada Bergeron, MD   Subjective   Patient and family decided to delay surgery to early next week. Blood pressure improved this AM Remains in Afib with HR 90-100s HgB 8.2>7.9  Patient reports a cough and headache this morning, no other significant symptoms.  Inpatient Medications    Scheduled Meds:  atorvastatin  10 mg Oral Daily   Chlorhexidine Gluconate Cloth  6 each Topical Once   cyanocobalamin  1,000 mcg Intramuscular Daily   feeding supplement  296 mL Oral Once   ferrous sulfate  325 mg Oral QODAY   folic acid  1 mg Oral Daily   Continuous Infusions:  sodium chloride 50 mL/hr at 05/31/22 0409   amiodarone 30 mg/hr (05/31/22 0648)   cefoTEtan (CEFOTAN) IV     PRN Meds:    Vital Signs    Vitals:   05/30/22 2123 05/31/22 0050 05/31/22 0350 05/31/22 0813  BP: 96/61 (!) 115/45 (!) 104/47 (!) 101/56  Pulse: 74 86 88 94  Resp: '18 19 19   '$ Temp: (!) 97.5 F (36.4 C) 98.5 F (36.9 C) 98.6 F (37 C) 98.4 F (36.9 C)  TempSrc: Oral Oral Oral Oral  SpO2: 92%  95% 96%  Weight:      Height:        Intake/Output Summary (Last 24 hours) at 05/31/2022 0948 Last data filed at 05/31/2022 0409 Gross per 24 hour  Intake 2036.12 ml  Output 300 ml  Net 1736.12 ml       05/27/2022   11:21 AM 05/24/2022   11:26 AM 05/17/2022   12:38 PM  Last 3 Weights  Weight (lbs) 205 lb 0.4 oz 205 lb 210 lb  Weight (kg) 93 kg 92.987 kg 95.255 kg      Telemetry    Afib 80-100s - Personally Reviewed  ECG    No new tracing- Personally Reviewed  Physical Exam   GEN: No acute distress.  Laying in bed comfortable Neck: No JVD Cardiac: Irregularly irregular, no murmurs Respiratory: Clear to auscultation bilaterally. GI: Soft, nontender, non-distended  MS: No edema, warm Neuro:  Nonfocal  Psych: Normal affect   Labs    High Sensitivity Troponin:    Recent Labs  Lab 05/27/22 1143 05/27/22 1809  TROPONINIHS 5 5      Chemistry Recent Labs  Lab 05/24/22 1155 05/24/22 1155 05/27/22 1143 05/28/22 0120 05/30/22 0227 05/31/22 0146  NA 136  --  136 136 134* 138  K 3.8  --  3.0* 4.4 4.2 3.6  CL 98  --  100 105 107 109  CO2 28  --  23 21* 19* 20*  GLUCOSE 107*  --  105* 98 79 79  BUN 20  --  15 13 7* 9  CREATININE 1.14  --  1.22* 1.13* 1.10* 1.15*  CALCIUM 8.7  --  9.6 8.7* 8.5* 8.6*  MG  --   --   --  2.0  --   --   PROT 6.4  --  6.5 5.6*  --   --   ALBUMIN 3.6  --  2.9* 2.6*  --   --   AST 10  --  16 17  --   --   ALT 5  --  10 7  --   --   ALKPHOS 94  --  84 76  --   --   BILITOT  0.7  --  0.7 0.6  --   --   GFRNONAA  --    < > 46* 50* 52* 49*  ANIONGAP  --    < > '13 10 8 9   '$ < > = values in this interval not displayed.     Lipids  Recent Labs  Lab 05/24/22 1155  CHOL 147  TRIG 98.0  HDL 41.50  LDLCALC 86  CHOLHDL 4     Hematology Recent Labs  Lab 05/29/22 0144 05/30/22 0227 05/31/22 0146  WBC 10.9* 10.1 8.4  RBC 3.67* 3.57* 3.34*  HGB 8.5* 8.2* 7.9*  HCT 28.9* 27.8* 25.7*  MCV 78.7* 77.9* 76.9*  MCH 23.2* 23.0* 23.7*  MCHC 29.4* 29.5* 30.7  RDW 16.7* 16.8* 16.8*  PLT 451* 425* 380    Thyroid  Recent Labs  Lab 05/24/22 1155  TSH 3.44     BNP Recent Labs  Lab 05/24/22 1155 05/27/22 1809  BNP  --  225.7*  PROBNP 203.0*  --      DDimer No results for input(s): "DDIMER" in the last 168 hours.   Radiology    [place] EKG SITE RITE  Result Date: 05/31/2022 If [facility] image not attached, placement could not be confirmed due to current cardiac rhythm.  CT ABDOMEN PELVIS W CONTRAST  Result Date: 05/29/2022 CLINICAL DATA:  Colon carcinoma staging EXAM: CT ABDOMEN AND PELVIS WITH CONTRAST TECHNIQUE: Multidetector CT imaging of the abdomen and pelvis was performed using the standard protocol following bolus administration of intravenous contrast. RADIATION DOSE REDUCTION: This exam was  performed according to the departmental dose-optimization program which includes automated exposure control, adjustment of the mA and/or kV according to patient size and/or use of iterative reconstruction technique. CONTRAST:  189m OMNIPAQUE IOHEXOL 350 MG/ML SOLN COMPARISON:  None Available. FINDINGS: Lower chest: Heart is enlarged in size. Scattered coronary artery calcifications are seen. There are small linear densities in the lower lung fields suggesting minimal scarring or subsegmental atelectasis. There is no focal consolidation in the lower lung fields. There is reconstruction prosthesis in left breast. Hepatobiliary: No focal abnormalities are seen in liver. There is no dilation of bile ducts. There are large gallbladder stones. There is no wall thickening in gallbladder. There is no fluid around the gallbladder. Pancreas: No focal abnormalities are seen. Spleen: Unremarkable. Adrenals/Urinary Tract: There is mild hyperplasia of adrenals without discrete nodules. There is no hydronephrosis. There is 5 mm calculus in the lower pole right kidney. Ureters are not dilated. Urinary bladder is unremarkable. Stomach/Bowel: Small hiatal hernia is seen. Stomach is not distended. There is no significant small bowel dilation. Appendix is not seen. There is 4.5 x 2.6 cm asymmetric mass lesion in the cecum close to the ileocolic junction. This lesion has to be considered a malignant neoplasm. There is mild asymmetric wall thickening in hepatic flexure measuring 2.2 cm in length and 6 mm in thickness. Scattered diverticula are seen in colon without signs of focal acute diverticulitis. There is no pericolic stranding. Vascular/Lymphatic: Scattered arterial calcifications are seen in aorta and its major branches. There are few subcentimeter mesenteric nodes. In view of the mass seen in cecum, follow-up PET-CT may be considered for further characterization of the slightly enlarged mesenteric lymph nodes. Reproductive:  Unremarkable. Other: There is no ascites or pneumoperitoneum. Umbilical hernia containing fat is seen. Maximum diameter of hernial sac measures 5.8 cm. Musculoskeletal: There is 50% decrease in height of body of L1 vertebra, most likely old fracture. There is  mild retropulsion in the upper endplate of body of L1 vertebra. There is 30% decrease in height of upper endplate of body of L3 vertebra. This may be recent or old. There is previous right hip arthroplasty. IMPRESSION: There is 4.5 x 2.6 cm asymmetric wall thickening in cecum close to the ileocecal junction. This lesion has to be considered a malignant neoplasm. There are few subcentimeter mesenteric lymph nodes which may suggest incidental benign reactive hyperplasia. In view of the mass lesion in the cecum, PET-CT may be considered for further characterization of mesenteric nodes. There is small focus of asymmetric wall thickening in hepatic flexure which may be an artifact due to incomplete distention or suggest neoplastic process. Please correlate with endoscopic findings. No focal abnormalities are seen in liver. 50% decrease in height of body of L1 vertebra most likely is old compression fracture. There is 30% decrease in height of upper endplate of body of L3 vertebra which may be recent or old fracture. If there are focal symptoms, follow-up MRI may be considered. There is no evidence of intestinal obstruction or pneumoperitoneum. There is no hydronephrosis. Large gallbladder stones without signs of acute cholecystitis or dilation of bile ducts. Small hiatal hernia. Diverticulosis of colon. Nonobstructing right renal calculus. There is moderate sized umbilical hernia containing fat. Aortic arteriosclerosis. Coronary artery calcifications are seen. Other findings as described in the body of the report. Electronically Signed   By: Elmer Picker M.D.   On: 05/29/2022 20:08    Cardiac Studies   TTE 04/16/22: IMPRESSIONS     1. Left ventricular  ejection fraction, by estimation, is 55 to 60%. The  left ventricle has normal function. The left ventricle has no regional  wall motion abnormalities. Left ventricular diastolic parameters are  indeterminate.   2. Right ventricular systolic function is normal. The right ventricular  size is normal. There is normal pulmonary artery systolic pressure.   3. Left atrial size was mildly dilated.   4. The mitral valve is normal in structure. Mild mitral valve  regurgitation. No evidence of mitral stenosis.   5. Tricuspid valve regurgitation is mild to moderate.   6. The aortic valve is tricuspid. Aortic valve regurgitation is not  visualized. Aortic valve sclerosis is present, with no evidence of aortic  valve stenosis.   7. The inferior vena cava is normal in size with greater than 50%  respiratory variability, suggesting right atrial pressure of 3 mmHg.   Patient Profile     76 y.o. female  with a hx of combined systolic and diastolic HF thought to be tachycardia induced CM in the setting of Aib with RVR, persistent Afib on rate control strategy, HTN, HLD and CKD who presented to the ER with progressive weakness found to be anemic and in Afib with RVR for which Cardiology was consulted. Underwent EGD/Colo on 05/29/22 which revealed obstructing colonic mass.   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 Now awaiting surgical evaluation. Revised cardiac risk index 10.1% for major CV event. She is clinically compensated from a HF standpoint and has no known obstructive CAD. Would not pursue ischemic evaluation due to lack of symptoms and would not change management as patient needs definitive treatment of her colonic mass and is high risk for DAPT due to anemia.   Management per surgery No further CV work-up needed prior to OR; revised cardiac risk index 10.1%   Persistent Afib Secondary hypercoagulable state  CHADs-vasc 5. Patient  has known chronic Afib  for which she has been maintained on rate control strategy. Presented on this admission with increasing weakness and SOB found to be in Afib with RVR with HR 110-130s. BP soft on admission and could not tolerate nodal agents and therefore amiodarone was started. She was also found to be anemic (9.7) with positive stool guaiac now found to have obstructing mass on colonoscopy. Xarelto was held and heparin was started.   HR remains generally well controlled; transition to oral amiodarone today, '200mg'$  BID.  Continues to have low BP, will hold nodal agents today Heparin now held in the setting of worsening anemia (HGB down to 7.9) Likely plan for apixaban over xarelto on discharge   Microcytic Anemia Low B12  HgB 9 on admission and was previously 13 in 2021. Fecal occult positive and found to have obstructing colonic mass on colonoscopy as above. HGB down to 7.9 this morning.  Management of colonic mass per surgery Heparin now held due to worsening anemia, consider transfusion. Suspect that until patient can have surgery, her hemoglobin will continue to trend down.  Generalized weakness/fatigue: Likely related to anemia and underlying malignancy as above.   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 Improved back to 55-60% in 04/2022. Currently euvolemic on exam. Holding GDMT due to soft blood pressures.  Hold metoprolol due to low BP Will add back GDMT as able pending blood pressures Monitor I/Os and daily weights Low Na diet   HTN  Now with low blood pressure.  Holding home meds in the setting of soft blood pressures   CKD Stage II:  Creatinine stable today, 1.15. Continue to follow closely.   HLD  Continue lipitor '10mg'$  daily      For questions or updates, please contact [place] Please consult [email or web address] for contact info under      Signed,  Lily Kocher PA-C 05/31/22     Signed, Freada Bergeron, MD  05/31/2022, 9:48 AM     Patient seen and examined and agree with Lily Kocher PA-C as detailed above.  In brief, the patient is a 76 y.o. female  with a hx of combined systolic and diastolic HF thought to be tachycardia induced CM in the setting of Aib with RVR, persistent Afib on rate control strategy, HTN, HLD and CKD who presented to the ER with progressive weakness found to be anemic and in Afib with RVR for which Cardiology was consulted. Underwent EGD/Colo on 05/29/22 which revealed obstructing colonic mass now contemplating surgery.  Patient and family are discussing plans for surgery but think they are likely to proceed with the procedure on Tuesday. Heparin held this morning due to worsening anemia. HR overall better controlled and will transition to PO amiodarone. Given soft blood pressure, holding nodal agents at this time. If needed, can add digoxin as well for additional rate control as options are limited.  GEN: No acute distress.  Laying in bed Neck: No JVD Cardiac: Irregularly irregular, no murmurs Respiratory: Clear to auscultation bilaterally. GI: Soft, nontender, non-distended  MS: No edema; No deformity. Neuro:  Nonfocal  Psych: Normal affect    Plan: -Transition from IV amiodarone to amiodarone '200mg'$  BID x2 weeks followed by amiodarone '200mg'$  daily thereafter -Heparin gtt being held due to drop in hemoglobin in the setting of colonic mass as above -Cannot add nodal agents due  to soft blood pressure; can add dig if needed -Management of obstructing colonic mass per surgery -No plans for ischemic work-up prior to surgery as patient has no ischemic symptoms and would not change management as she cannot tolerate DAPT and requires surgery for underlying malignancy; revised cardiac risk index 10.1%   Gwyndolyn Kaufman, MD

## 2022-06-01 ENCOUNTER — Inpatient Hospital Stay (HOSPITAL_COMMUNITY): Payer: Medicare Other

## 2022-06-01 DIAGNOSIS — I82409 Acute embolism and thrombosis of unspecified deep veins of unspecified lower extremity: Secondary | ICD-10-CM

## 2022-06-01 DIAGNOSIS — M7989 Other specified soft tissue disorders: Secondary | ICD-10-CM

## 2022-06-01 DIAGNOSIS — E538 Deficiency of other specified B group vitamins: Secondary | ICD-10-CM | POA: Diagnosis not present

## 2022-06-01 DIAGNOSIS — I4891 Unspecified atrial fibrillation: Secondary | ICD-10-CM | POA: Diagnosis not present

## 2022-06-01 DIAGNOSIS — I5032 Chronic diastolic (congestive) heart failure: Secondary | ICD-10-CM | POA: Diagnosis not present

## 2022-06-01 DIAGNOSIS — K6389 Other specified diseases of intestine: Secondary | ICD-10-CM | POA: Diagnosis not present

## 2022-06-01 HISTORY — DX: Acute embolism and thrombosis of unspecified deep veins of unspecified lower extremity: I82.409

## 2022-06-01 LAB — BASIC METABOLIC PANEL
Anion gap: 5 (ref 5–15)
BUN: 8 mg/dL (ref 8–23)
CO2: 21 mmol/L — ABNORMAL LOW (ref 22–32)
Calcium: 8.1 mg/dL — ABNORMAL LOW (ref 8.9–10.3)
Chloride: 113 mmol/L — ABNORMAL HIGH (ref 98–111)
Creatinine, Ser: 1 mg/dL (ref 0.44–1.00)
GFR, Estimated: 58 mL/min — ABNORMAL LOW (ref >60)
Glucose, Bld: 99 mg/dL (ref 70–99)
Potassium: 3.4 mmol/L — ABNORMAL LOW (ref 3.5–5.1)
Sodium: 139 mmol/L (ref 135–145)

## 2022-06-01 LAB — BPAM RBC
Blood Product Expiration Date: 202401052359
ISSUE DATE / TIME: 202312221849
Unit Type and Rh: 5100

## 2022-06-01 LAB — TYPE AND SCREEN
ABO/RH(D): O POS
Antibody Screen: NEGATIVE
Unit division: 0

## 2022-06-01 LAB — CBC
HCT: 27.7 % — ABNORMAL LOW (ref 36.0–46.0)
Hemoglobin: 8.3 g/dL — ABNORMAL LOW (ref 12.0–15.0)
MCH: 24.3 pg — ABNORMAL LOW (ref 26.0–34.0)
MCHC: 30 g/dL (ref 30.0–36.0)
MCV: 81 fL (ref 80.0–100.0)
Platelets: 279 10*3/uL (ref 150–400)
RBC: 3.42 MIL/uL — ABNORMAL LOW (ref 3.87–5.11)
RDW: 16.9 % — ABNORMAL HIGH (ref 11.5–15.5)
WBC: 8.1 10*3/uL (ref 4.0–10.5)
nRBC: 0.5 % — ABNORMAL HIGH (ref 0.0–0.2)

## 2022-06-01 LAB — BRAIN NATRIURETIC PEPTIDE: B Natriuretic Peptide: 313.1 pg/mL — ABNORMAL HIGH (ref 0.0–100.0)

## 2022-06-01 LAB — MAGNESIUM: Magnesium: 2.1 mg/dL (ref 1.7–2.4)

## 2022-06-01 NOTE — Progress Notes (Signed)
Rounding Note    Patient Name: Natasha Dickson Date of Encounter: 06/01/2022  [facility] Cardiologist: Freada Bergeron, MD   Subjective   Patient and family decided to delay surgery to early next week. Blood pressure improved Remains in Afib with HR 90-100s HgB 8.2>7.9>8.3  Patient overall well today. Chronic cough.  Inpatient Medications    Scheduled Meds:  amiodarone  200 mg Oral BID   atorvastatin  10 mg Oral Daily   Chlorhexidine Gluconate Cloth  6 each Topical Daily   cyanocobalamin  1,000 mcg Intramuscular Daily   feeding supplement  237 mL Oral TID BM   ferrous sulfate  325 mg Oral QODAY   folic acid  1 mg Oral Daily   sodium chloride flush  10-40 mL Intracatheter Q12H   Continuous Infusions:   PRN Meds:    Vital Signs    Vitals:   05/31/22 2000 05/31/22 2200 06/01/22 0026 06/01/22 0405  BP: 104/66 (!) 114/59 (!) 106/48 (!) 126/49  Pulse: 98 93 100 94  Resp:  '18 18 20  '$ Temp:  97.7 F (36.5 C) 98.8 F (37.1 C) 98.1 F (36.7 C)  TempSrc:  Oral Oral Oral  SpO2: 100% 100% 97% 96%  Weight:      Height:        Intake/Output Summary (Last 24 hours) at 06/01/2022 1016 Last data filed at 06/01/2022 0410 Gross per 24 hour  Intake 2472.65 ml  Output 300 ml  Net 2172.65 ml      05/27/2022   11:21 AM 05/24/2022   11:26 AM 05/17/2022   12:38 PM  Last 3 Weights  Weight (lbs) 205 lb 0.4 oz 205 lb 210 lb  Weight (kg) 93 kg 92.987 kg 95.255 kg      Telemetry    Afib 80-100s - Personally Reviewed  ECG    No new tracing- Personally Reviewed  Physical Exam   GEN: No acute distress.  Laying in bed comfortable Neck: No JVD Cardiac: Irregularly irregular, no murmurs Respiratory: Clear to auscultation bilaterally. GI: Soft, nontender, non-distended  MS: 1+ puffy UE edema, warm, trace LE edema Neuro:  Nonfocal  Psych: Normal affect   Labs    High Sensitivity Troponin:   Recent Labs  Lab 05/27/22 1143 05/27/22 1809   TROPONINIHS 5 5     Chemistry Recent Labs  Lab 05/27/22 1143 05/28/22 0120 05/30/22 0227 05/31/22 0146 06/01/22 0015  NA 136 136 134* 138 139  K 3.0* 4.4 4.2 3.6 3.4*  CL 100 105 107 109 113*  CO2 23 21* 19* 20* 21*  GLUCOSE 105* 98 79 79 99  BUN 15 13 7* 9 8  CREATININE 1.22* 1.13* 1.10* 1.15* 1.00  CALCIUM 9.6 8.7* 8.5* 8.6* 8.1*  MG  --  2.0  --   --  2.1  PROT 6.5 5.6*  --   --   --   ALBUMIN 2.9* 2.6*  --   --   --   AST 16 17  --   --   --   ALT 10 7  --   --   --   ALKPHOS 84 76  --   --   --   BILITOT 0.7 0.6  --   --   --   GFRNONAA 46* 50* 52* 49* 58*  ANIONGAP '13 10 8 9 5    '$ Lipids  No results for input(s): "CHOL", "TRIG", "HDL", "LABVLDL", "LDLCALC", "CHOLHDL" in the last 168 hours.  Hematology Recent  Labs  Lab 05/30/22 0227 05/31/22 0146 06/01/22 0015  WBC 10.1 8.4 8.1  RBC 3.57* 3.34* 3.42*  HGB 8.2* 7.9* 8.3*  HCT 27.8* 25.7* 27.7*  MCV 77.9* 76.9* 81.0  MCH 23.0* 23.7* 24.3*  MCHC 29.5* 30.7 30.0  RDW 16.8* 16.8* 16.9*  PLT 425* 380 279   Thyroid  Recent Labs  Lab 05/31/22 0146  TSH 5.363*    BNP Recent Labs  Lab 05/27/22 1809  BNP 225.7*    DDimer No results for input(s): "DDIMER" in the last 168 hours.   Radiology    [place] EKG SITE RITE  Result Date: 05/31/2022 If [facility] image not attached, placement could not be confirmed due to current cardiac rhythm.   Cardiac Studies   TTE 04/16/22: IMPRESSIONS     1. Left ventricular ejection fraction, by estimation, is 55 to 60%. The  left ventricle has normal function. The left ventricle has no regional  wall motion abnormalities. Left ventricular diastolic parameters are  indeterminate.   2. Right ventricular systolic function is normal. The right ventricular  size is normal. There is normal pulmonary artery systolic pressure.   3. Left atrial size was mildly dilated.   4. The mitral valve is normal in structure. Mild mitral valve  regurgitation. No evidence of mitral  stenosis.   5. Tricuspid valve regurgitation is mild to moderate.   6. The aortic valve is tricuspid. Aortic valve regurgitation is not  visualized. Aortic valve sclerosis is present, with no evidence of aortic  valve stenosis.   7. The inferior vena cava is normal in size with greater than 50%  respiratory variability, suggesting right atrial pressure of 3 mmHg.   Patient Profile     76 y.o. female  with a hx of combined systolic and diastolic HF thought to be tachycardia induced CM in the setting of Aib with RVR, persistent Afib on rate control strategy, HTN, HLD and CKD who presented to the ER with progressive weakness found to be anemic and in Afib with RVR for which Cardiology was consulted. Underwent EGD/Colo on 05/29/22 which revealed obstructing colonic mass.   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 Now awaiting surgical evaluation. Revised cardiac risk index 10.1% for major CV event. She is clinically compensated from a HF standpoint and has no known obstructive CAD. Would not pursue ischemic evaluation due to lack of symptoms and would not change management as patient needs definitive treatment of her colonic mass and is high risk for DAPT due to anemia.   Management per surgery No further CV work-up needed prior to OR; revised cardiac risk index 10.1%   Persistent Afib Secondary hypercoagulable state  CHADs-vasc 5. Patient has known chronic Afib for which she has been maintained on rate control strategy. Presented on this admission with increasing weakness and SOB found to be in Afib with RVR with HR 110-130s. BP soft on admission and could not tolerate nodal agents and therefore amiodarone was started. She was also found to be anemic (9.7) with positive stool guaiac now found to have obstructing mass on colonoscopy. Xarelto was held and heparin was started.   HR remains generally well controlled; oral amiodarone today, '200mg'$  BID.  BP  improved. Heparin now held in the setting of worsening anemia (HGB down to 7.9)   Microcytic Anemia Low B12  HgB 9 on admission and was previously 13 in 2021. Fecal occult positive and found to have obstructing colonic  mass on colonoscopy as above. HGB down to 7.9.  Management of colonic mass per surgery Heparin now held due to worsening anemia, consider transfusion. Suspect that until patient can have surgery, her hemoglobin will continue to trend down.  Generalized weakness/fatigue: Likely related to anemia and underlying malignancy as above.   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 Improved back to 55-60% in 04/2022. Holding GDMT due to soft blood pressures.  Hold metoprolol due to low BP, can add as tolerated. Will add back GDMT as able pending blood pressures Monitor I/Os and daily weights Low Na diet Agree with primary service, can likely d/c maintenance fluids and resume home lasix, with mild UE edema.   HTN  Normotensive today Holding home meds in the setting of soft blood pressures   CKD Stage II:  Creatinine stable today, 1.0. improved.    HLD  Continue lipitor '10mg'$  daily      For questions or updates, please contact [place] Please consult [email or web address] for contact info under

## 2022-06-01 NOTE — Progress Notes (Signed)
NT reported that patient has been incontinent of a large amount of liquid black stool. No further incidence today. No complaints of pain, nausea or vomiting. Tolerated full liquid breakfast and lunch. Drinking chocolate ensures twice today.

## 2022-06-01 NOTE — Progress Notes (Signed)
Mobility Specialist - Progress Note   06/01/22 1210  Mobility  Activity Transferred from chair to bed  Level of Assistance Minimal assist, patient does 75% or more  Assistive Device Front wheel walker  Activity Response Tolerated well  Mobility Referral Yes  $Mobility charge 1 Mobility   Pt was received in chair and requesting to get back to bed. Pt was MinA to stand from chair. Pt left in bed with all needs met.  Franki Monte  Mobility Specialist Please contact via Solicitor or Rehab office at [phone]

## 2022-06-01 NOTE — Progress Notes (Signed)
[facility] Progress Note  3 Days Post-Op  Subjective: Denies any current abdominal pain, nausea, vomiting. She denies any pain. +flatus today and BM yesterday (small). Denies any nausea or vomiting and she is tolerating clear liquids.  Objective: Vital signs in last 24 hours: Temp:  [97.3 F (36.3 C)-98.8 F (37.1 C)] 98.1 F (36.7 C) (12/23 0405) Pulse Rate:  [93-106] 94 (12/23 0405) Resp:  [18-20] 20 (12/23 0405) BP: (104-126)/(35-66) 126/49 (12/23 0405) SpO2:  [96 %-100 %] 96 % (12/23 0405) Last BM Date : 05/30/22  Intake/Output from previous day: 12/22 0701 - 12/23 0700 In: 2572.7 [I.V.:1174.7; Blood:1298; IV Piggyback:100] Out: 300 [Urine:300] Intake/Output this shift: No intake/output data recorded.  PE: Gen:  Alert, NAD, pleasant Card:  irregular Pulm: rate and effort normal Abd: obese, soft, ND, NT  Lab Results:  Recent Labs    05/31/22 0146 06/01/22 0015  WBC 8.4 8.1  HGB 7.9* 8.3*  HCT 25.7* 27.7*  PLT 380 279    BMET Recent Labs    05/31/22 0146 06/01/22 0015  NA 138 139  K 3.6 3.4*  CL 109 113*  CO2 20* 21*  GLUCOSE 79 99  BUN 9 8  CREATININE 1.15* 1.00  CALCIUM 8.6* 8.1*    PT/INR No results for input(s): "LABPROT", "INR" in the last 72 hours. CMP     Component Value Date/Time   NA 139 06/01/2022 0015   NA 141 10/11/2021 1136   K 3.4 (L) 06/01/2022 0015   CL 113 (H) 06/01/2022 0015   CO2 21 (L) 06/01/2022 0015   GLUCOSE 99 06/01/2022 0015   BUN 8 06/01/2022 0015   BUN 14 10/11/2021 1136   CREATININE 1.00 06/01/2022 0015   CREATININE 0.99 03/11/2016 0911   CALCIUM 8.1 (L) 06/01/2022 0015   PROT 5.6 (L) 05/28/2022 0120   PROT 7.1 07/04/2020 1041   ALBUMIN 2.6 (L) 05/28/2022 0120   ALBUMIN 4.4 07/04/2020 1041   AST 17 05/28/2022 0120   ALT 7 05/28/2022 0120   ALKPHOS 76 05/28/2022 0120   BILITOT 0.6 05/28/2022 0120   BILITOT 1.1 07/04/2020 1041   GFRNONAA 58 (L) 06/01/2022 0015   GFRAA 55 (L) 07/04/2020 1041    Lipase  No results found for: "LIPASE"     Studies/Results: [place] EKG SITE RITE  Result Date: 05/31/2022 If Site Rite image not attached, placement could not be confirmed due to current cardiac rhythm.   Anti-infectives: Anti-infectives (From admission, onward)    Start     Dose/Rate Route Frequency Ordered Stop   05/31/22 0600  cefoTEtan (CEFOTAN) 2 g in sodium chloride 0.9 % 100 mL IVPB        2 g 200 mL/hr over 30 Minutes Intravenous On call to O.R. 05/30/22 1716 06/01/22 0559   05/30/22 2200  neomycin (MYCIFRADIN) tablet 1,000 mg       See Hyperspace for full Linked Orders Report.   1,000 mg Oral 3 times per day 05/30/22 1716 05/30/22 2238   05/30/22 2200  metroNIDAZOLE (FLAGYL) tablet 1,000 mg       See Hyperspace for full Linked Orders Report.   1,000 mg Oral 3 times per day 05/30/22 1716 05/30/22 2128        Assessment/Plan Colon Mass, adenocarcinoma from surgical path  - Colonoscopy 12/20 showed completely obstructing tumor in the cecum and at the ileocecal valve, along with polyps of the transverse, descending, and sigmoid colon -polyps are all benign except mass at ICV with adenocarcinoma - CEA  normal at 4.1. - CT chest w/o evidence of mets. CT a/p subcentimeter mesenteric lymph nodes, no abnormalities seen in the liver - Cardiology following and recs No further CV work-up needed prior to OR, revised cardiac risk index 10.1%  -patient prefers to continue CLD and ensures.  Tentatively plan for OR early next week.  FEN - CLD, Ensure VTE - SCDs, heparin gtt on hold currently ID - None   A. Fib on Xarelto (last dose 12/18, currently on heparin gtt, but held) CHF (30-35% 2014, now 55-60% in 04/2022) HTN HLD CKD2   I reviewed Consultant cardiology notes, last 24 h vitals and pain scores, last 48 h intake and output, last 24 h labs and trends, and last 24 h imaging results.    LOS: 5 days    Henreitta Cea, [facility] 06/01/2022, 11:59  AM Please see Amion for pager number during day hours 7:00am-4:30pm

## 2022-06-01 NOTE — Progress Notes (Signed)
Bilateral upper extremity venous duplex has been completed. Preliminary results can be found in CV Proc through chart review.  Results were given to Dr. Bonner Puna.  06/01/22 3:32 PM Natasha Dickson RVT

## 2022-06-01 NOTE — Progress Notes (Signed)
Patient was assisted to bedside commode. HR went up to 160's. Patient denies dizziness or chest pain at that moment. Patient then had a bowel movement of black liquid stool. Assisted back to bed and HR came down to 90-100's. BP stable.

## 2022-06-01 NOTE — Progress Notes (Signed)
[facility] PROGRESS NOTE  Natasha Dickson (DOB: 1945/07/06) YBO:[id number] PCP: Natasha Ghent, MD  Brief Narrative: Natasha Dickson is a 76 y.o. female with medical history significant of atrial fibrillation on xarelto, systolic HF (recovered EF), non ischemic cardiomyopathy, hypertension and multiple other medical issues here with progressive generalized weakness.  Found to have new anemia and + FOBT as well as AFib with RVR. She was admitted with cardiology consultations. GI took for EGD/colonoscopy 12/20 which showed a "malignant completely obstructing tumor in the cecum and at the ileocecal valve," which has been found to be invasive adenocarcinoma. There were polyps in the transverse, descending, and sigmoid colon which were also sent to pathology ultimately ruling out for malignancy. CT abd/pelvis showed mesenteric LNs, no liver lesions. Surgery is consulted for colectomy. Rate control has been complicated by hypotension for which cardiology is consulted. This has improved. She has received IV iron and will receive 1u PRBCs 12/20 for progressive anemia.  Subjective: Recognizes me, remembers my name, says she's taking liquids just fine. No abd pain, N/V or overtly bloody stool. Feels her breathing is better, no chest pain.   Objective: BP (!) 126/49 (BP Location: Left Arm)   Pulse 94   Temp 98.1 F (36.7 C) (Oral)   Resp 20   Ht '5\' 1"'$  (1.549 m)   Wt 93 kg   SpO2 96%   BMI 38.74 kg/m   Gen: Elderly no distress Pulm: Clear, nonlabored  CV: Irreg irreg, rate in 80's, 1+ pitting dependent edema GI: Soft, NT, ND, +BS Neuro: Alert and oriented. No new focal deficits. Ext: Warm, no deformities, New/worsening edema in RUE and nontender palpable longitudinal structure in left forearm. No fluctuance.  Skin: PICC site RUE c/d/i.    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 CT abd/pelvis staging scan  showed subcentimeter mesenteric LNs and 4.5cm x 2.6cm thickening of cecum close to ileocecal junction. No hepatic abnormalities. CEA oddly only 3.6 > 4.1.  - PT and family have discussed with surgery, medicine team, and cardiology team multidisciplinary at the bedside 12/22. They know of significant risk of morbidity and mortality with surgery as well as suspected inevitable progression of the mass leading to obstruction and continued bleeding. ACS risk calculator will be used to quantify and specify risks per general surgery.  - Surgery note says advance diet as tolerated, will advance to full liquids. Currently planning on hemicolectomy 12/26 or will be on call if urgently needed. - Note no further work up recommended by cardiology, RCRI 10.1%.  - Does not have an acute abdomen at this time. Will continue close monitoring.   Mild cognitive impairment: Based on symptom report prior to hospitalization. The patient does have capacity to make most medical decisions most of the time, but this is not guaranteed with hospital delirium.  - Any procedures or interventions requiring informed consent should include discussion with the patient's daughter and/or husband.  Chronic combined HFrEF with recovered EF, HTN: Appears roughly euvolemic and taking limited po so was given IVF.  - Will stop IVF, check BNP with increasing edema. Likely restart home lasix '20mg'$  daily, defer to cardiology. - Metoprolol on hold due to soft BP per cardiology - Soft BP limiting other GDMT at this time. Holding home BP meds.  - Coronary calcifications noted on scans, though deferring ischemic evaluation at this time.  RUE > LUE edema: Relatively acute. Off anticoagulation.  - Check venous U/S.  Persistent AFib with RVR:  - CHA2DS2-VASc score is 5, though pt has known active bleeding source, hypotension, and transfusion-dependent anemia. Holding anticoagulation for the time being.  - Rates fairly controlled but soft BP. Digoxin  per cardiology.  - Continue amiodarone, converted IV > PO 12/22.  Symptomatic iron deficiency, acute on chronic blood loss anemia: Ferritin is 7, iron 15, 3% sat, TIBC up to 535. Suspect thrombocytosis is reactive to this. s/p 1u PRBC 12/22. - IV iron given - Hgb up but not robustly after transfusion, will monitor serially.   Vitamin B12 deficiency: Level is 166. - IM supplement x7 days then weekly  HLD: CK low.  - Statin  Stage IIIa CKD: Based on available CrCl values.  - Avoid nephrotoxins as able. CrCl holding stable, improving. - PICC inserted 12/22 after informed consent.   Obesity: Body mass index is 38.74 kg/m.   Hypokalemia: Repeat supplement today.  Natasha Pour, MD [facility] [email or web address] 06/01/2022, 9:51 AM

## 2022-06-01 NOTE — Progress Notes (Signed)
Occupational Therapy Treatment Patient Details Name: Natasha Dickson MRN: [id number] DOB: 19-Jun-1945 Today's Date: 06/01/2022   History of present illness 76 y.o. female who presented to the ED 12/18 with hypotension and weakness. During work up found to have malignant completely obstructing tumor at cecum awaiting pathology before determining course of care.  PMH: A. Fib with RVR on Xarelto (last dose 12/18, currently on heparin gtt), CHF (30-35% 2014, now 55-60% in 04/2022), HTN, HLD, CKD2   OT comments  Patient received in supine and agreeable to getting OOB. Patient continues to require min assist to get to EOB due to assistance needed with raising trunk. Patient continues to require min assist for LB dressing due to difficulty reaching feet. Patient states she is hoping to stay with her daughter following discharge for increased assistance at home since her husband is [facility] [employer] [facility]. Discharge recommendation continues to be appropriate for [facility] with family providing additional assistance. Acute OT to continue to follow.    Recommendations for follow up therapy are one component of a multi-disciplinary discharge planning process, led by the attending physician.  Recommendations may be updated based on patient status, additional functional criteria and insurance authorization.    Follow Up Recommendations  Home health OT     Assistance Recommended at Discharge Intermittent Supervision/Assistance  Patient can return home with the following  A little help with walking and/or transfers;A little help with bathing/dressing/bathroom;Assistance with cooking/housework;Assist for transportation;Help with stairs or ramp for entrance   Equipment Recommendations  BSC/3in1    Recommendations for Other Services      Precautions / Restrictions Precautions Precautions: Fall Precaution Comments: watch BP Restrictions Weight Bearing Restrictions: No       Mobility Bed Mobility Overal bed mobility: Needs  Assistance Bed Mobility: Supine to Sit     Supine to sit: Min assist     General bed mobility comments: min assist to raise trunk    Transfers Overall transfer level: Needs assistance Equipment used: Rolling walker (2 wheels) Transfers: Sit to/from Stand, Bed to chair/wheelchair/BSC Sit to Stand: Min guard     Step pivot transfers: Min guard     General transfer comment: no compaints of dizziness     Balance Overall balance assessment: Needs assistance Sitting-balance support: Feet supported Sitting balance-Leahy Scale: Fair     Standing balance support: Bilateral upper extremity supported, During functional activity Standing balance-Leahy Scale: Fair Standing balance comment: reliant on RW to stand for transfers                           ADL either performed or assessed with clinical judgement   ADL Overall ADL's : Needs assistance/impaired     Grooming: Set up;Sitting               Lower Body Dressing: Minimal assistance;Sit to/from stand Lower Body Dressing Details (indicate cue type and reason): trash can used to prop feet on to allow patient to reach. required increased time and assistance to get over toes Toilet Transfer: Min guard;Rolling walker (2 wheels);Stand-pivot [employer] forces technical officer Details (indicate cue type and reason): simulated to recliner                Extremity/Trunk Assessment              Vision       Perception     Praxis      Cognition Arousal/Alertness: Awake/alert Behavior During Therapy: WFL for tasks assessed/performed Overall Cognitive Status: Within  Functional Limits for tasks assessed                                          Exercises      Shoulder Instructions       General Comments      Pertinent Vitals/ Pain       Pain Assessment Pain Assessment: Faces Faces Pain Scale: Hurts a little bit Pain Location: R lower quadrant Pain Descriptors / Indicators: Grimacing Pain  Intervention(s): Monitored during session, Repositioned  Home Living                                          Prior Functioning/Environment              Frequency  Min 2X/week        Progress Toward Goals  OT Goals(current goals can now be found in the care plan section)  Progress towards OT goals: Progressing toward goals  Acute Rehab OT Goals Patient Stated Goal: go home to her daughters OT Goal Formulation: With patient Time For Goal Achievement: 06/13/22 Potential to Achieve Goals: Good ADL Goals Pt Will Perform Grooming: with modified independence;standing Pt Will Perform Lower Body Dressing: with modified independence;sit to/from stand Pt Will Transfer to Toilet: with modified independence;ambulating Additional ADL Goal #1: Pt will complete at least 8 minutes of OOB functional tasks to demonstrate increased activity tolerance Additional ADL Goal #2: Pt will indep complete IADL medication management task  Plan Discharge plan remains appropriate    Co-evaluation                 AM-PAC OT "6 Clicks" Daily Activity     Outcome Measure   Help from another person eating meals?: None Help from another person taking care of personal grooming?: A Little Help from another person toileting, which includes using toliet, bedpan, or urinal?: A Little Help from another person bathing (including washing, rinsing, drying)?: A Little Help from another person to put on and taking off regular upper body clothing?: None Help from another person to put on and taking off regular lower body clothing?: A Little 6 Click Score: 20    End of Session Equipment Utilized During Treatment: Rolling walker (2 wheels)  OT Visit Diagnosis: Unsteadiness on feet (R26.81);Other abnormalities of gait and mobility (R26.89);Muscle weakness (generalized) (M62.81);Pain   Activity Tolerance Patient tolerated treatment well   Patient Left in chair;with call bell/phone within  reach;with chair alarm set   Nurse Communication Mobility status        Time: 1038-1100 OT Time Calculation (min): 22 min  Charges: OT General Charges $OT Visit: 1 Visit OT Treatments $Self Care/Home Management : 8-22 mins  Lodema Hong, [place]  Office [place] 06/01/2022, 12:34 PM

## 2022-06-02 ENCOUNTER — Inpatient Hospital Stay (HOSPITAL_COMMUNITY): Payer: Medicare Other

## 2022-06-02 DIAGNOSIS — I4891 Unspecified atrial fibrillation: Secondary | ICD-10-CM | POA: Diagnosis not present

## 2022-06-02 DIAGNOSIS — K6389 Other specified diseases of intestine: Secondary | ICD-10-CM | POA: Diagnosis not present

## 2022-06-02 DIAGNOSIS — E538 Deficiency of other specified B group vitamins: Secondary | ICD-10-CM | POA: Diagnosis not present

## 2022-06-02 DIAGNOSIS — I5032 Chronic diastolic (congestive) heart failure: Secondary | ICD-10-CM | POA: Diagnosis not present

## 2022-06-02 HISTORY — PX: IR US GUIDE VASC ACCESS RIGHT: IMG2390

## 2022-06-02 HISTORY — PX: IR FLUORO GUIDE CV LINE RIGHT: IMG2283

## 2022-06-02 LAB — BASIC METABOLIC PANEL
Anion gap: 10 (ref 5–15)
BUN: 6 mg/dL — ABNORMAL LOW (ref 8–23)
CO2: 20 mmol/L — ABNORMAL LOW (ref 22–32)
Calcium: 8.4 mg/dL — ABNORMAL LOW (ref 8.9–10.3)
Chloride: 109 mmol/L (ref 98–111)
Creatinine, Ser: 1.01 mg/dL — ABNORMAL HIGH (ref 0.44–1.00)
GFR, Estimated: 58 mL/min — ABNORMAL LOW (ref >60)
Glucose, Bld: 112 mg/dL — ABNORMAL HIGH (ref 70–99)
Potassium: 3.8 mmol/L (ref 3.5–5.1)
Sodium: 139 mmol/L (ref 135–145)

## 2022-06-02 LAB — HEMOGLOBIN AND HEMATOCRIT, BLOOD
HCT: 28.9 % — ABNORMAL LOW (ref 36.0–46.0)
HCT: 28.8 % — ABNORMAL LOW (ref 36.0–46.0)
Hemoglobin: 9.1 g/dL — ABNORMAL LOW (ref 12.0–15.0)
Hemoglobin: 8.7 g/dL — ABNORMAL LOW (ref 12.0–15.0)

## 2022-06-02 LAB — CBC
HCT: 26.7 % — ABNORMAL LOW (ref 36.0–46.0)
Hemoglobin: 8.2 g/dL — ABNORMAL LOW (ref 12.0–15.0)
MCH: 24.5 pg — ABNORMAL LOW (ref 26.0–34.0)
MCHC: 30.7 g/dL (ref 30.0–36.0)
MCV: 79.7 fL — ABNORMAL LOW (ref 80.0–100.0)
Platelets: 238 10*3/uL (ref 150–400)
RBC: 3.35 MIL/uL — ABNORMAL LOW (ref 3.87–5.11)
RDW: 18.7 % — ABNORMAL HIGH (ref 11.5–15.5)
WBC: 8.9 10*3/uL (ref 4.0–10.5)
nRBC: 0.5 % — ABNORMAL HIGH (ref 0.0–0.2)

## 2022-06-02 MED ORDER — ALTEPLASE 2 MG IJ SOLR
2.0000 mg | Freq: Once | INTRAMUSCULAR | Status: DC
  Filled 2022-06-02: qty 2

## 2022-06-02 MED ORDER — CYANOCOBALAMIN 1000 MCG/ML IJ SOLN
1000.0000 ug | INTRAMUSCULAR | Status: DC
  Administered 2022-06-09: 1000 ug via INTRAMUSCULAR
  Filled 2022-06-02: qty 1

## 2022-06-02 MED ORDER — LIDOCAINE HCL 1 % IJ SOLN
INTRAMUSCULAR | Status: AC
  Filled 2022-06-02: qty 20

## 2022-06-02 MED ORDER — ENOXAPARIN SODIUM 80 MG/0.8ML IJ SOSY
80.0000 mg | PREFILLED_SYRINGE | Freq: Two times a day (BID) | INTRAMUSCULAR | Status: DC
  Administered 2022-06-02 (×2): 80 mg via SUBCUTANEOUS
  Filled 2022-06-02 (×2): qty 0.8

## 2022-06-02 NOTE — Progress Notes (Signed)
ANTICOAGULATION CONSULT NOTE - Initial Consult  Pharmacy Consult for lovenox Indication: DVT  No Known Allergies  Patient Measurements: Height: '5\' 1"'$  (154.9 cm) Weight: 93 kg (205 lb 0.4 oz) IBW/kg (Calculated) : 47.8 Heparin Dosing Weight: 69.7 kg  Vital Signs: Temp: 98.4 F (36.9 C) (12/24 0815) Temp Source: Oral (12/24 0815) BP: 119/50 (12/24 0815) Pulse Rate: 105 (12/24 0815)  Labs: Recent Labs    05/31/22 0146 06/01/22 0015 06/02/22 0623 06/02/22 0626  HGB 7.9* 8.3* 8.2*  --   HCT 25.7* 27.7* 26.7*  --   PLT 380 279 238  --   CREATININE 1.15* 1.00  --  1.01*    Estimated Creatinine Clearance: 49.3 mL/min (A) (by C-G formula based on SCr of 1.01 mg/dL (H)).   Medical History: Past Medical History:  Diagnosis Date   Breast cancer ([place]) 1999   Stage I in left breast   Chronic combined systolic and diastolic CHF (congestive heart failure) ([place])    a. 02/2013 Echo: EF 30-35%, diff HK, biatrial enlargement. b. 2D echo 07/2014 showed mildly dilated LV, EF 45-50%, severely dilated left atrium and moderately dilated right atrium.   CKD (chronic kidney disease), stage II    Closed fracture of unspecified part of neck of femur 2011   Right.  Soreness when standing. (Dr. Durward Fortes)   Nonischemic cardiomyopathy [facility])    a. 02/2013 Lexi CL: No ischemia, prob attenuation vs scar, EF 40%-->Med Rx.   Persistent atrial fibrillation ([place])    a. 02/2013: s/p TEE and attempted cardioversion x 2-->failed-->rate controlled with bb/digoxin, Xarelto initiated.   Assessment: 76 year old female presenting with afib w/ RVR - on Xarelto PTA. While admitted was started on heparin infusion. Heparin held in s/o progressive anemia. After PICC line placement, patient developed DVT, pharmacy has been consulted to dose enoxaparin.   Hgb 8.2, plt WNL.  Goal of Therapy:  Monitor platelets by anticoagulation protocol: Yes   Plan:  Start enoxaparin 80 mg SQ BID Monitor CBC and signs/symptoms of  bleeding  Louanne Belton, PharmD, [facility] PGY1 Pharmacy Resident 06/02/2022 10:09 AM

## 2022-06-02 NOTE — Progress Notes (Signed)
Assessed pt BUE.  RUE edematous, warm, pink with strong radial pulses.  LUE without edema, pulses strong, warm and pink in color.  Pt complains of tenderness at posterior wrist area apparently from venipuncture site of some source.  PICC without blood return from either port, but flushes easily.  Dr Bonner Puna on unit, advised of assessment.  States plan to notify IR for central access, leave PICC in current place for possible heparin infusion and blood transfusion until IR can place a different access due to BUE DVT's.  Orders not to administer alteplase at this time.  Jessica RN aware of plan and need to remove PIV RFA.

## 2022-06-02 NOTE — Procedures (Signed)
Interventional Radiology Procedure Note  Procedure: Non-tunneled central venous catheter placement  Complications: None  Estimated Blood Loss: None  Findings: 5 Fr, DL Power PICC cut to 16 cm and placed via right IJ vein. Tip at SVC/RA junction.  Venetia Night. Kathlene Cote, M.D Pager:  [phone]

## 2022-06-02 NOTE — Progress Notes (Signed)
Rounding Note    Patient Name: Natasha Dickson Date of Encounter: 06/02/2022  [place] Cardiologist: Freada Bergeron, MD   Subjective    Blood pressure improved Remains in Afib with HR 90-100s HgB 8.2>7.9>8.3  Patient not in room, chart reviewed, patient not seen.  Inpatient Medications    Scheduled Meds:  alteplase  2 mg Intracatheter Once   alteplase  2 mg Intracatheter Once   amiodarone  200 mg Oral BID   atorvastatin  10 mg Oral Daily   Chlorhexidine Gluconate Cloth  6 each Topical Daily   [START ON 06/09/2022] cyanocobalamin  1,000 mcg Intramuscular Q Sun   enoxaparin (LOVENOX) injection  80 mg Subcutaneous Q12H   feeding supplement  237 mL Oral TID BM   folic acid  1 mg Oral Daily   sodium chloride flush  10-40 mL Intracatheter Q12H   Continuous Infusions:   PRN Meds:    Vital Signs    Vitals:   06/02/22 0018 06/02/22 0429 06/02/22 0638 06/02/22 0815  BP: (!) 121/54 (!) 108/48 122/61 (!) 119/50  Pulse: 92 (!) 102 99 (!) 105  Resp: '19 20  20  '$ Temp: 98.6 F (37 C) 98.8 F (37.1 C) 98.8 F (37.1 C) 98.4 F (36.9 C)  TempSrc: Oral Oral Oral Oral  SpO2: 97% 97% 97% 96%  Weight:      Height:        Intake/Output Summary (Last 24 hours) at 06/02/2022 1016 Last data filed at 06/02/2022 0022 Gross per 24 hour  Intake --  Output 500 ml  Net -500 ml      05/27/2022   11:21 AM 05/24/2022   11:26 AM 05/17/2022   12:38 PM  Last 3 Weights  Weight (lbs) 205 lb 0.4 oz 205 lb 210 lb  Weight (kg) 93 kg 92.987 kg 95.255 kg      Telemetry    Afib 80-100s - Personally Reviewed  ECG    No new tracing- Personally Reviewed  Physical Exam   Pt not in room.  Labs    High Sensitivity Troponin:   Recent Labs  Lab 05/27/22 1143 05/27/22 1809  TROPONINIHS 5 5     Chemistry Recent Labs  Lab 05/27/22 1143 05/28/22 0120 05/30/22 0227 05/31/22 0146 06/01/22 0015 06/02/22 0626  NA 136 136   < > 138 139 139  K 3.0* 4.4   < >  3.6 3.4* 3.8  CL 100 105   < > 109 113* 109  CO2 23 21*   < > 20* 21* 20*  GLUCOSE 105* 98   < > 79 99 112*  BUN 15 13   < > 9 8 6*  CREATININE 1.22* 1.13*   < > 1.15* 1.00 1.01*  CALCIUM 9.6 8.7*   < > 8.6* 8.1* 8.4*  MG  --  2.0  --   --  2.1  --   PROT 6.5 5.6*  --   --   --   --   ALBUMIN 2.9* 2.6*  --   --   --   --   AST 16 17  --   --   --   --   ALT 10 7  --   --   --   --   ALKPHOS 84 76  --   --   --   --   BILITOT 0.7 0.6  --   --   --   --   GFRNONAA 46* 50*   < >  49* 58* 58*  ANIONGAP 13 10   < > '9 5 10   '$ < > = values in this interval not displayed.    Lipids  No results for input(s): "CHOL", "TRIG", "HDL", "LABVLDL", "LDLCALC", "CHOLHDL" in the last 168 hours.  Hematology Recent Labs  Lab 05/31/22 0146 06/01/22 0015 06/02/22 0623  WBC 8.4 8.1 8.9  RBC 3.34* 3.42* 3.35*  HGB 7.9* 8.3* 8.2*  HCT 25.7* 27.7* 26.7*  MCV 76.9* 81.0 79.7*  MCH 23.7* 24.3* 24.5*  MCHC 30.7 30.0 30.7  RDW 16.8* 16.9* 18.7*  PLT 380 279 238   Thyroid  Recent Labs  Lab 05/31/22 0146  TSH 5.363*    BNP Recent Labs  Lab 05/27/22 1809 06/01/22 1030  BNP 225.7* 313.1*    DDimer No results for input(s): "DDIMER" in the last 168 hours.   Radiology    VAS [place] UPPER EXTREMITY VENOUS DUPLEX  Result Date: 06/01/2022 UPPER VENOUS STUDY  Patient Name:  BLAYKLEE MABLE  Date of Exam:   06/01/2022 Medical Rec #: [id number]      Accession #:    [id number] Date of Birth: Sep 08, 1945      Patient Gender: F Patient Age:   42 years Exam Location:  [facility] Procedure:      VAS [place] UPPER EXTREMITY VENOUS DUPLEX Referring Phys: Vance Gather --------------------------------------------------------------------------------  Indications: Swelling Risk Factors: None identified. Limitations: Bandages and line. Comparison Study: No prior studies. Performing Technologist: Oliver Hum RVT  Examination Guidelines: A complete evaluation includes B-mode imaging, spectral Doppler, color Doppler, and  power Doppler as needed of all accessible portions of each vessel. Bilateral testing is considered an integral part of a complete examination. Limited examinations for reoccurring indications may be performed as noted.  Right Findings: +----------+------------+---------+-----------+----------+-------+ RIGHT     CompressiblePhasicitySpontaneousPropertiesSummary +----------+------------+---------+-----------+----------+-------+ IJV           Full       Yes       Yes                      +----------+------------+---------+-----------+----------+-------+ Subclavian    Full       Yes       Yes                      +----------+------------+---------+-----------+----------+-------+ Axillary      None       No        No                Acute  +----------+------------+---------+-----------+----------+-------+ Brachial    Partial      No        No                Acute  +----------+------------+---------+-----------+----------+-------+ Radial        Full                                          +----------+------------+---------+-----------+----------+-------+ Ulnar         Full                                          +----------+------------+---------+-----------+----------+-------+ Cephalic      Full                                          +----------+------------+---------+-----------+----------+-------+  Basilic       None                                   Acute  +----------+------------+---------+-----------+----------+-------+  Left Findings: +----------+------------+---------+-----------+----------+-------+ LEFT      CompressiblePhasicitySpontaneousPropertiesSummary +----------+------------+---------+-----------+----------+-------+ IJV           Full       Yes       Yes                      +----------+------------+---------+-----------+----------+-------+ Subclavian    Full       Yes       Yes                       +----------+------------+---------+-----------+----------+-------+ Axillary      Full       Yes       Yes                      +----------+------------+---------+-----------+----------+-------+ Brachial      Full       Yes       Yes                      +----------+------------+---------+-----------+----------+-------+ Radial        Full                                          +----------+------------+---------+-----------+----------+-------+ Ulnar         Full                                          +----------+------------+---------+-----------+----------+-------+ Cephalic      None                                   Acute  +----------+------------+---------+-----------+----------+-------+ Basilic       Full                                          +----------+------------+---------+-----------+----------+-------+  Summary:  Right: Findings consistent with acute deep vein thrombosis involving the right axillary vein and right brachial veins. Findings consistent with acute superficial vein thrombosis involving the right basilic vein.  Left: Findings consistent with acute superficial vein thrombosis involving the left cephalic vein.  *See table(s) above for measurements and observations.  Diagnosing physician: Deitra Mayo MD Electronically signed by Deitra Mayo MD on 06/01/2022 at 4:28:35 PM.    Final     Cardiac Studies   TTE 04/16/22: IMPRESSIONS     1. Left ventricular ejection fraction, by estimation, is 55 to 60%. The  left ventricle has normal function. The left ventricle has no regional  wall motion abnormalities. Left ventricular diastolic parameters are  indeterminate.   2. Right ventricular systolic function is normal. The right ventricular  size is normal. There is normal pulmonary artery systolic pressure.   3. Left atrial size was mildly dilated.   4. The mitral valve is normal in structure. Mild mitral valve  regurgitation. No  evidence of mitral stenosis.   5. Tricuspid valve regurgitation is mild to moderate.   6. The aortic valve is tricuspid. Aortic valve regurgitation is not  visualized. Aortic valve sclerosis is present, with no evidence of aortic  valve stenosis.   7. The inferior vena cava is normal in size with greater than 50%  respiratory variability, suggesting right atrial pressure of 3 mmHg.   Patient Profile     76 y.o. female  with a hx of combined systolic and diastolic HF thought to be tachycardia induced CM in the setting of Aib with RVR, persistent Afib on rate control strategy, HTN, HLD and CKD who presented to the ER with progressive weakness found to be anemic and in Afib with RVR for which Cardiology was consulted. Underwent EGD/Colo on 05/29/22 which revealed obstructing colonic mass.   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 Now awaiting surgical evaluation. Revised cardiac risk index 10.1% for major CV event. She is clinically compensated from a HF standpoint and has no known obstructive CAD. Would not pursue ischemic evaluation due to lack of symptoms and would not change management as patient needs definitive treatment of her colonic mass and is high risk for DAPT due to anemia.   Management per surgery No further CV work-up needed prior to OR; revised cardiac risk index 10.1%   Persistent Afib Secondary hypercoagulable state  CHADs-vasc 5. Patient has known chronic Afib for which she has been maintained on rate control strategy. Presented on this admission with increasing weakness and SOB found to be in Afib with RVR with HR 110-130s. BP soft on admission and could not tolerate nodal agents and therefore amiodarone was started. She was also found to be anemic (9.7) with positive stool guaiac now found to have obstructing mass on colonoscopy. Xarelto was held and heparin was started.   HR remains generally well controlled; oral amiodarone today,  '200mg'$  BID x 2 weeks total then transition to 200 mg daily.  BP improved. Heparin initially held due to worsening anemia however now with acute DVT of R brachial/axillary vein in setting of malignancy. Heparin cautiously resumed per primary   Microcytic Anemia Low B12  HgB 9 on admission and was previously 13 in 2021. Fecal occult positive and found to have obstructing colonic mass on colonoscopy as above. HGB down to 7.9.  Management of colonic mass per surgery consider transfusion if Hgb <8.  Generalized weakness/fatigue: Likely related to anemia and underlying malignancy as above.   Chronic Combined Systolic and Diastolic HF with Improved EF: EF dropped to 30-35% 2014 thought to be secondary to tachy induced CM in the setting of Afib with RVR. Improved back to 55-60% in 04/2022. Holding GDMT due to soft blood pressures.  Hold metoprolol due to low BP, can add as tolerated. Will add back GDMT as able pending blood pressures Monitor I/Os and daily weights Low Na diet Agree with primary service, can likely d/c maintenance fluids. May need diuresis after volume load of surgery, will need to anticipate.   HTN  Overall normotensive today Holding home meds in the setting of soft blood pressures   CKD Stage II:  Creatinine stable today, 1.01. improved.    HLD  Continue lipitor '10mg'$  daily  Cardiology will follow, patient was in IR at time of rounds today.  For questions or updates, please contact [place] Please consult [email or web address] for contact info under

## 2022-06-02 NOTE — Progress Notes (Addendum)
[facility] PROGRESS NOTE  ANUSHRI CASALINO (DOB: [birth year]) WIO:[id number] PCP: Tonia Ghent, MD  Brief Narrative: Natasha Dickson is a 76 y.o. female with medical history significant of atrial fibrillation on xarelto, systolic HF (recovered EF), non ischemic cardiomyopathy, hypertension and multiple other medical issues here with progressive generalized weakness.  Found to have new anemia and + FOBT as well as AFib with RVR. She was admitted with cardiology consultations. GI took for EGD/colonoscopy 12/20 which showed a "malignant completely obstructing tumor in the cecum and at the ileocecal valve," which has been found to be invasive adenocarcinoma. There were polyps in the transverse, descending, and sigmoid colon which were also sent to pathology ultimately ruling out for malignancy. CT abd/pelvis showed mesenteric LNs, no liver lesions. Rate control has been complicated by hypotension for which cardiology is consulted, maintaining marginal rate control with amiodarone with soft BP. She has received IV iron and 1u PRBCs 12/20 for progressive anemia and had heparin held due to this. Due to continued difficulty with venous access, PICC line placed, subsequently developed DVT of right brachial and axillary veins as well as SVT of bilateral cephalic veins for which therapeutic anticoagulation is reinitiated. Serial hemoglobin checks are ongoing. Surgery is planning colectomy 12/26 as OR schedule allows pending hemodynamic stability.   Subjective: Having dark stools beginning yesterday, no abd pain N or V. No chest pain, dyspnea or palpitations at this time.   Objective: BP (!) 119/50 (BP Location: Left Arm)   Pulse (!) 105   Temp 98.4 F (36.9 C) (Oral)   Resp 20   Ht '5\' 1"'$  (1.549 m)   Wt 93 kg   SpO2 96%   BMI 38.74 kg/m   Gen: Elderly pleasant alert female in no distress Pulm: Nonlabored, clear  CV: Irreg irreg, rate in 100's GI: Soft, NT, ND, +BS. No peritonitis. Neuro: Alert and  oriented. No new focal deficits. Ext: Warm, no deformities Skin: RUE markedly edematous, warm normal coloration, normal sensation/motor function and cap refill. RUE PICC c/d/i. Palpable left forearm clot with no significant tenderness, more mild edema and normal sensory/motor exam. Weeping wound LU arm is stable.  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 CT abd/pelvis staging scan showed subcentimeter mesenteric LNs and 4.5cm x 2.6cm thickening of cecum close to ileocecal junction. No hepatic abnormalities. CEA oddly only 3.6 > 4.1.  - PT and family have discussed with surgery, medicine team, and cardiology team multidisciplinary at the bedside 12/22. They know of significant risk of morbidity and mortality with surgery as well as suspected inevitable progression of the mass leading to obstruction and continued bleeding. ACS risk calculator used to quantify and specify risks per general surgery.  - Surgery note says advance diet as tolerated, continue full liquids. Currently planning on hemicolectomy 12/26 or will be on call if urgently needed. Discussed risk of obstruction and perforation with daughter today, neither currently appear to be happening. - Note no further work up recommended by cardiology, RCRI 10.1%.  - Does not have an acute abdomen at this time. Will continue close monitoring.   Mild cognitive impairment: Based on symptom report prior to hospitalization. The patient does have capacity to make most medical decisions most of the time, but this is not guaranteed with hospital delirium.  - Any procedures or interventions requiring informed consent should include discussion with the patient's daughter and/or husband.  Chronic combined HFrEF with recovered EF, HTN: Appears roughly  euvolemic and taking limited po so was given IVF.  - Holding IVF for now, defer whether to restart home diuretic to  cardiology. - Metoprolol on hold due to soft BP per cardiology - Soft BP limiting other GDMT at this time. Holding home BP meds.  - Coronary calcifications noted on scans, though deferring ischemic evaluation at this time.  Acute right brachial/axillary DVT, acute right cephalic and left cephalic SVT: In the setting of malignancy, remains hypercoagulable.  - Don't see an option to avoid anticoagulation at this point despite known colon mass bleeding. This does not appear to be brisk despite black stools. This is in the setting of, and I suspect due to, oral iron supplementation. Would not expect a brisk bleed from the colon even right sided to oxidize considerably enough to present as anything other than red bleeding. Will check H/H serially as we initiate lovenox as discussed above.  - Start therapeutic anticoagulation, will use lovenox as we await CVC placement by either IR or PCCM (whichever is able to place first). Therapeutic goal should be 0.3-0.5. May have to transfuse but again, don't believe we have an option. The patient and daughter are completely aware of risks of CVC insertion and of anticoagulation vs. withholding anticoagulation, informed consent verbally given to insert CVC, start therapeutic anticoagulation, and give blood products if hgb declines at all to 8 or less.   Persistent AFib with RVR:  - CHA2DS2-VASc score is 5. Anticoagulation as above - Rates fairly controlled but soft BP. Digoxin per cardiology.  - Continue amiodarone, converted IV > PO 12/22. Rates still up with any exertion.  Symptomatic iron deficiency, acute on chronic blood loss anemia: Ferritin is 7, iron 15, 3% sat, TIBC up to 535. Suspect thrombocytosis is reactive to this. s/p 1u PRBC 12/22. - IV iron given - Hgb up but not robustly after transfusion, will monitor serially.  - H/H q6h, transfuse prn hgb 8g/dl or less.   Vitamin B12 deficiency: Level is 166. - IM supplemented daily x7 days, now  weekly  HLD: CK low.  - Statin  Stage IIIa CKD: Based on available CrCl values.  - Avoid nephrotoxins as able. CrCl holding stable, improving. - PICC inserted 12/22 after informed consent.   Obesity: Body mass index is 38.74 kg/m.   Hypokalemia: Repeat supplement today.  [name], MD [facility] [email or web address] 06/02/2022, 9:37 AM

## 2022-06-03 ENCOUNTER — Encounter (HOSPITAL_COMMUNITY): Payer: Self-pay | Admitting: Gastroenterology

## 2022-06-03 DIAGNOSIS — K6389 Other specified diseases of intestine: Secondary | ICD-10-CM | POA: Diagnosis not present

## 2022-06-03 DIAGNOSIS — E538 Deficiency of other specified B group vitamins: Secondary | ICD-10-CM | POA: Diagnosis not present

## 2022-06-03 DIAGNOSIS — I4891 Unspecified atrial fibrillation: Secondary | ICD-10-CM | POA: Diagnosis not present

## 2022-06-03 DIAGNOSIS — I5032 Chronic diastolic (congestive) heart failure: Secondary | ICD-10-CM | POA: Diagnosis not present

## 2022-06-03 LAB — CBC
HCT: 27.5 % — ABNORMAL LOW (ref 36.0–46.0)
Hemoglobin: 8.4 g/dL — ABNORMAL LOW (ref 12.0–15.0)
MCH: 25 pg — ABNORMAL LOW (ref 26.0–34.0)
MCHC: 30.5 g/dL (ref 30.0–36.0)
MCV: 81.8 fL (ref 80.0–100.0)
Platelets: 222 10*3/uL (ref 150–400)
RBC: 3.36 MIL/uL — ABNORMAL LOW (ref 3.87–5.11)
RDW: 20.1 % — ABNORMAL HIGH (ref 11.5–15.5)
WBC: 9.6 10*3/uL (ref 4.0–10.5)
nRBC: 1.1 % — ABNORMAL HIGH (ref 0.0–0.2)

## 2022-06-03 LAB — BASIC METABOLIC PANEL
Anion gap: 5 (ref 5–15)
BUN: 6 mg/dL — ABNORMAL LOW (ref 8–23)
CO2: 22 mmol/L (ref 22–32)
Calcium: 7.9 mg/dL — ABNORMAL LOW (ref 8.9–10.3)
Chloride: 109 mmol/L (ref 98–111)
Creatinine, Ser: 0.9 mg/dL (ref 0.44–1.00)
GFR, Estimated: >60 mL/min (ref >60)
Glucose, Bld: 93 mg/dL (ref 70–99)
Potassium: 4 mmol/L (ref 3.5–5.1)
Sodium: 136 mmol/L (ref 135–145)

## 2022-06-03 LAB — SURGICAL PCR SCREEN
MRSA, PCR: NEGATIVE
Staphylococcus aureus: POSITIVE — AB

## 2022-06-03 MED ORDER — DIGOXIN 0.25 MG/ML IJ SOLN
0.2500 mg | Freq: Three times a day (TID) | INTRAMUSCULAR | Status: AC
  Administered 2022-06-03 (×3): 0.25 mg via INTRAVENOUS
  Filled 2022-06-03 (×3): qty 1

## 2022-06-03 MED ORDER — NEOMYCIN SULFATE 500 MG PO TABS
1000.0000 mg | ORAL_TABLET | ORAL | Status: AC
  Administered 2022-06-03 (×3): 1000 mg via ORAL
  Filled 2022-06-03 (×3): qty 2

## 2022-06-03 MED ORDER — ENOXAPARIN SODIUM 80 MG/0.8ML IJ SOSY
80.0000 mg | PREFILLED_SYRINGE | Freq: Two times a day (BID) | INTRAMUSCULAR | Status: AC
  Administered 2022-06-03 (×2): 80 mg via SUBCUTANEOUS
  Filled 2022-06-03 (×2): qty 0.8

## 2022-06-03 MED ORDER — METOPROLOL TARTRATE 12.5 MG HALF TABLET
12.5000 mg | ORAL_TABLET | Freq: Four times a day (QID) | ORAL | Status: DC
  Administered 2022-06-03: 12.5 mg via ORAL
  Filled 2022-06-03 (×2): qty 1

## 2022-06-03 MED ORDER — METOPROLOL TARTRATE 12.5 MG HALF TABLET: 12.5000 mg | ORAL_TABLET | Freq: Three times a day (TID) | ORAL | Status: DC | PRN

## 2022-06-03 MED ORDER — CHLORHEXIDINE GLUCONATE CLOTH 2 % EX PADS: 6.0000 | MEDICATED_PAD | Freq: Once | CUTANEOUS | Status: AC

## 2022-06-03 MED ORDER — ENSURE PRE-SURGERY PO LIQD
296.0000 mL | Freq: Once | ORAL | Status: AC
  Administered 2022-06-04: 296 mL via ORAL
  Filled 2022-06-03 (×2): qty 296

## 2022-06-03 MED ORDER — ALVIMOPAN 12 MG PO CAPS
12.0000 mg | ORAL_CAPSULE | ORAL | Status: AC
  Administered 2022-06-04: 12 mg via ORAL
  Filled 2022-06-03: qty 1

## 2022-06-03 MED ORDER — METRONIDAZOLE 500 MG PO TABS
1000.0000 mg | ORAL_TABLET | ORAL | Status: AC
  Administered 2022-06-03 (×3): 1000 mg via ORAL
  Filled 2022-06-03 (×2): qty 2

## 2022-06-03 MED ORDER — ENOXAPARIN SODIUM 80 MG/0.8ML IJ SOSY: 80.0000 mg | PREFILLED_SYRINGE | Freq: Two times a day (BID) | INTRAMUSCULAR | Status: DC

## 2022-06-03 MED ORDER — CHLORHEXIDINE GLUCONATE CLOTH 2 % EX PADS
6.0000 | MEDICATED_PAD | Freq: Once | CUTANEOUS | Status: AC
  Administered 2022-06-03: 6 via TOPICAL

## 2022-06-03 MED ORDER — ACETAMINOPHEN 500 MG PO TABS
1000.0000 mg | ORAL_TABLET | ORAL | Status: AC
  Administered 2022-06-04: 1000 mg via ORAL
  Filled 2022-06-03: qty 2

## 2022-06-03 MED ORDER — POLYETHYLENE GLYCOL 3350 17 G PO PACK
17.0000 g | PACK | Freq: Four times a day (QID) | ORAL | Status: AC
  Administered 2022-06-03 (×4): 17 g via ORAL
  Filled 2022-06-03 (×4): qty 1

## 2022-06-03 MED ORDER — MUPIROCIN 2 % EX OINT
1.0000 | TOPICAL_OINTMENT | Freq: Two times a day (BID) | CUTANEOUS | Status: AC
  Administered 2022-06-03 – 2022-06-08 (×10): 1 via NASAL
  Filled 2022-06-03: qty 22

## 2022-06-03 MED ORDER — ENSURE PRE-SURGERY PO LIQD
592.0000 mL | Freq: Once | ORAL | Status: AC
  Administered 2022-06-03: 592 mL via ORAL
  Filled 2022-06-03 (×2): qty 592

## 2022-06-03 MED ORDER — SODIUM CHLORIDE 0.9 % IV BOLUS
500.0000 mL | Freq: Once | INTRAVENOUS | Status: AC
  Administered 2022-06-03: 500 mL via INTRAVENOUS

## 2022-06-03 MED ORDER — ONDANSETRON HCL 4 MG/2ML IJ SOLN
4.0000 mg | Freq: Once | INTRAMUSCULAR | Status: AC | PRN
  Administered 2022-06-03: 4 mg via INTRAVENOUS
  Filled 2022-06-03: qty 2

## 2022-06-03 MED ORDER — SODIUM CHLORIDE 0.9 % IV SOLN
2.0000 g | INTRAVENOUS | Status: DC
  Filled 2022-06-03: qty 2

## 2022-06-03 MED ORDER — FUROSEMIDE 20 MG PO TABS
20.0000 mg | ORAL_TABLET | Freq: Every day | ORAL | Status: DC
  Administered 2022-06-03 – 2022-06-05 (×3): 20 mg via ORAL
  Filled 2022-06-03 (×3): qty 1

## 2022-06-03 MED ORDER — BUPIVACAINE LIPOSOME 1.3 % IJ SUSP: 20.0000 mL | Freq: Once | INTRAMUSCULAR | Status: DC

## 2022-06-03 NOTE — Progress Notes (Signed)
Rounding Note    Patient Name: Natasha Dickson Date of Encounter: 06/03/2022  [place] Cardiologist: Freada Bergeron, MD   Subjective   Sustained elevated rates overnight. Underwent tunneled CVC placement yesterday.   Inpatient Medications    Scheduled Meds:  alteplase  2 mg Intracatheter Once   alteplase  2 mg Intracatheter Once   amiodarone  200 mg Oral BID   atorvastatin  10 mg Oral Daily   Chlorhexidine Gluconate Cloth  6 each Topical Daily   [START ON 06/09/2022] cyanocobalamin  1,000 mcg Intramuscular Q Sun   enoxaparin (LOVENOX) injection  80 mg Subcutaneous Q12H   feeding supplement  237 mL Oral TID BM   folic acid  1 mg Oral Daily   metoprolol tartrate  12.5 mg Oral Q6H   sodium chloride flush  10-40 mL Intracatheter Q12H   Continuous Infusions:   PRN Meds:    Vital Signs    Vitals:   06/02/22 2345 06/03/22 0030 06/03/22 0044 06/03/22 0506  BP: (!) 136/113 (!) 120/50 (!) 118/94 (!) 110/52  Pulse: (!) 136 (!) 135 (!) 117 (!) 112  Resp:    20  Temp: 98.7 F (37.1 C)   98.4 F (36.9 C)  TempSrc: Oral   Oral  SpO2: 97% 95% 96% 96%  Weight:      Height:        Intake/Output Summary (Last 24 hours) at 06/03/2022 0604 Last data filed at 06/03/2022 0510 Gross per 24 hour  Intake 536.22 ml  Output 551 ml  Net -14.78 ml      05/27/2022   11:21 AM 05/24/2022   11:26 AM 05/17/2022   12:38 PM  Last 3 Weights  Weight (lbs) 205 lb 0.4 oz 205 lb 210 lb  Weight (kg) 93 kg 92.987 kg 95.255 kg      Telemetry    Afib RVR - Personally Reviewed  ECG    No new tracing- Personally Reviewed  Physical Exam   GEN: No acute distress.  Laying in bed comfortable Neck: No JVD, tunneled CVC R side Cardiac: Irregularly irregular, no murmurs Respiratory: Clear to auscultation bilaterally. GI: Soft, nontender, non-distended  MS: 1+ puffy UE edema, warm, trace LE edema Neuro:  Nonfocal  Psych: Normal affect   Labs    High Sensitivity  Troponin:   Recent Labs  Lab 05/27/22 1143 05/27/22 1809  TROPONINIHS 5 5     Chemistry Recent Labs  Lab 05/27/22 1143 05/28/22 0120 05/30/22 0227 06/01/22 0015 06/02/22 0626 06/03/22 0452  NA 136 136   < > 139 139 136  K 3.0* 4.4   < > 3.4* 3.8 4.0  CL 100 105   < > 113* 109 109  CO2 23 21*   < > 21* 20* 22  GLUCOSE 105* 98   < > 99 112* 93  BUN 15 13   < > 8 6* 6*  CREATININE 1.22* 1.13*   < > 1.00 1.01* 0.90  CALCIUM 9.6 8.7*   < > 8.1* 8.4* 7.9*  MG  --  2.0  --  2.1  --   --   PROT 6.5 5.6*  --   --   --   --   ALBUMIN 2.9* 2.6*  --   --   --   --   AST 16 17  --   --   --   --   ALT 10 7  --   --   --   --  ALKPHOS 84 76  --   --   --   --   BILITOT 0.7 0.6  --   --   --   --   GFRNONAA 46* 50*   < > 58* 58* >60  ANIONGAP 13 10   < > '5 10 5   '$ < > = values in this interval not displayed.    Lipids  No results for input(s): "CHOL", "TRIG", "HDL", "LABVLDL", "LDLCALC", "CHOLHDL" in the last 168 hours.  Hematology Recent Labs  Lab 06/01/22 0015 06/02/22 0623 06/02/22 1122 06/02/22 1950 06/03/22 0452  WBC 8.1 8.9  --   --  9.6  RBC 3.42* 3.35*  --   --  3.36*  HGB 8.3* 8.2* 9.1* 8.7* 8.4*  HCT 27.7* 26.7* 28.9* 28.8* 27.5*  MCV 81.0 79.7*  --   --  81.8  MCH 24.3* 24.5*  --   --  25.0*  MCHC 30.0 30.7  --   --  30.5  RDW 16.9* 18.7*  --   --  20.1*  PLT 279 238  --   --  222   Thyroid  Recent Labs  Lab 05/31/22 0146  TSH 5.363*    BNP Recent Labs  Lab 05/27/22 1809 06/01/22 1030  BNP 225.7* 313.1*    DDimer No results for input(s): "DDIMER" in the last 168 hours.   Radiology    IR Fluoro Guide CV Line Right  Result Date: 06/02/2022 CLINICAL DATA:  Need for central venous access. Current right upper extremity PICC line needs to be removed due to development thrombus around the catheter. Also currently with superficial thrombophlebitis in the left upper extremity related to recent IV placement. EXAM: NON-TUNNELED CENTRAL VENOUS CATHETER  PLACEMENT WITH ULTRASOUND AND FLUOROSCOPIC GUIDANCE FLUOROSCOPY: 18 seconds.  2.0 mGy. PROCEDURE: The procedure, risks, benefits, and alternatives were explained to the patient. Questions regarding the procedure were encouraged and answered. The patient understands and consents to the procedure. The right neck and chest were prepped with chlorhexidine in a sterile fashion, and a sterile drape was applied covering the operative field. Maximum barrier sterile technique with sterile gowns and gloves were used for the procedure. Local anesthesia was provided with 1% lidocaine. Ultrasound was used to confirm patency of the right internal jugular vein. A permanent ultrasound image was saved and recorded. After creating a small venotomy incision, a 21 gauge needle was advanced into the right internal jugular vein under direct, real-time ultrasound guidance. Ultrasound image documentation was performed. After securing guidewire access, a peel-away sheath was placed over a guide wire. Utilizing guide wire measurement, a 5 [place], dual-lumen power injectable non tunneled catheter was cut to 16 cm. The catheter was then placed through the sheath and the sheath removed. Final catheter positioning was confirmed and documented with a fluoroscopic spot image. The catheter was aspirated and flushed with saline. The catheter exit site was secured with 0-Prolene retention sutures. COMPLICATIONS: None.  No pneumothorax. FINDINGS: After catheter placement, the tip lies at the cavoatrial junction. The catheter aspirates normally and is ready for immediate use. IMPRESSION: Placement of non-tunneled central venous catheter via the right internal jugular vein. The catheter tip lies at the cavoatrial junction. The catheter is ready for immediate use. Electronically Signed   By: Aletta Edouard M.D.   On: 06/02/2022 12:22   IR US Guide Vasc Access Right  Result Date: 06/02/2022 CLINICAL DATA:  Need for central venous access. Current  right upper extremity PICC line needs to be removed  due to development thrombus around the catheter. Also currently with superficial thrombophlebitis in the left upper extremity related to recent IV placement. EXAM: NON-TUNNELED CENTRAL VENOUS CATHETER PLACEMENT WITH ULTRASOUND AND FLUOROSCOPIC GUIDANCE FLUOROSCOPY: 18 seconds.  2.0 mGy. PROCEDURE: The procedure, risks, benefits, and alternatives were explained to the patient. Questions regarding the procedure were encouraged and answered. The patient understands and consents to the procedure. The right neck and chest were prepped with chlorhexidine in a sterile fashion, and a sterile drape was applied covering the operative field. Maximum barrier sterile technique with sterile gowns and gloves were used for the procedure. Local anesthesia was provided with 1% lidocaine. Ultrasound was used to confirm patency of the right internal jugular vein. A permanent ultrasound image was saved and recorded. After creating a small venotomy incision, a 21 gauge needle was advanced into the right internal jugular vein under direct, real-time ultrasound guidance. Ultrasound image documentation was performed. After securing guidewire access, a peel-away sheath was placed over a guide wire. Utilizing guide wire measurement, a 5 [place], dual-lumen power injectable non tunneled catheter was cut to 16 cm. The catheter was then placed through the sheath and the sheath removed. Final catheter positioning was confirmed and documented with a fluoroscopic spot image. The catheter was aspirated and flushed with saline. The catheter exit site was secured with 0-Prolene retention sutures. COMPLICATIONS: None.  No pneumothorax. FINDINGS: After catheter placement, the tip lies at the cavoatrial junction. The catheter aspirates normally and is ready for immediate use. IMPRESSION: Placement of non-tunneled central venous catheter via the right internal jugular vein. The catheter tip lies at the  cavoatrial junction. The catheter is ready for immediate use. Electronically Signed   By: Aletta Edouard M.D.   On: 06/02/2022 12:22   VAS [place] UPPER EXTREMITY VENOUS DUPLEX  Result Date: 06/01/2022 UPPER VENOUS STUDY  Patient Name:  Natasha Dickson  Date of Exam:   06/01/2022 Medical Rec #: [id number]      Accession #:    [id number] Date of Birth: July 30, 1945      Patient Gender: F Patient Age:   76 years Exam Location:  [facility] Procedure:      VAS [place] UPPER EXTREMITY VENOUS DUPLEX Referring Phys: Vance Gather --------------------------------------------------------------------------------  Indications: Swelling Risk Factors: None identified. Limitations: Bandages and line. Comparison Study: No prior studies. Performing Technologist: Oliver Hum RVT  Examination Guidelines: A complete evaluation includes B-mode imaging, spectral Doppler, color Doppler, and power Doppler as needed of all accessible portions of each vessel. Bilateral testing is considered an integral part of a complete examination. Limited examinations for reoccurring indications may be performed as noted.  Right Findings: +----------+------------+---------+-----------+----------+-------+ RIGHT     CompressiblePhasicitySpontaneousPropertiesSummary +----------+------------+---------+-----------+----------+-------+ IJV           Full       Yes       Yes                      +----------+------------+---------+-----------+----------+-------+ Subclavian    Full       Yes       Yes                      +----------+------------+---------+-----------+----------+-------+ Axillary      None       No        No                Acute  +----------+------------+---------+-----------+----------+-------+ Brachial    Partial  No        No                Acute  +----------+------------+---------+-----------+----------+-------+ Radial        Full                                           +----------+------------+---------+-----------+----------+-------+ Ulnar         Full                                          +----------+------------+---------+-----------+----------+-------+ Cephalic      Full                                          +----------+------------+---------+-----------+----------+-------+ Basilic       None                                   Acute  +----------+------------+---------+-----------+----------+-------+  Left Findings: +----------+------------+---------+-----------+----------+-------+ LEFT      CompressiblePhasicitySpontaneousPropertiesSummary +----------+------------+---------+-----------+----------+-------+ IJV           Full       Yes       Yes                      +----------+------------+---------+-----------+----------+-------+ Subclavian    Full       Yes       Yes                      +----------+------------+---------+-----------+----------+-------+ Axillary      Full       Yes       Yes                      +----------+------------+---------+-----------+----------+-------+ Brachial      Full       Yes       Yes                      +----------+------------+---------+-----------+----------+-------+ Radial        Full                                          +----------+------------+---------+-----------+----------+-------+ Ulnar         Full                                          +----------+------------+---------+-----------+----------+-------+ Cephalic      None                                   Acute  +----------+------------+---------+-----------+----------+-------+ Basilic       Full                                          +----------+------------+---------+-----------+----------+-------+  Summary:  Right: Findings consistent with acute deep vein thrombosis involving the right axillary vein and right brachial veins. Findings consistent with acute superficial vein thrombosis  involving the right basilic vein.  Left: Findings consistent with acute superficial vein thrombosis involving the left cephalic vein.  *See table(s) above for measurements and observations.  Diagnosing physician: Deitra Mayo MD Electronically signed by Deitra Mayo MD on 06/01/2022 at 4:28:35 PM.    Final     Cardiac Studies   TTE 04/16/22: IMPRESSIONS     1. Left ventricular ejection fraction, by estimation, is 55 to 60%. The  left ventricle has normal function. The left ventricle has no regional  wall motion abnormalities. Left ventricular diastolic parameters are  indeterminate.   2. Right ventricular systolic function is normal. The right ventricular  size is normal. There is normal pulmonary artery systolic pressure.   3. Left atrial size was mildly dilated.   4. The mitral valve is normal in structure. Mild mitral valve  regurgitation. No evidence of mitral stenosis.   5. Tricuspid valve regurgitation is mild to moderate.   6. The aortic valve is tricuspid. Aortic valve regurgitation is not  visualized. Aortic valve sclerosis is present, with no evidence of aortic  valve stenosis.   7. The inferior vena cava is normal in size with greater than 50%  respiratory variability, suggesting right atrial pressure of 3 mmHg.   Patient Profile     76 y.o. female  with a hx of combined systolic and diastolic HF thought to be tachycardia induced CM in the setting of Aib with RVR, persistent Afib on rate control strategy, HTN, HLD and CKD who presented to the ER with progressive weakness found to be anemic and in Afib with RVR for which Cardiology was consulted. Underwent EGD/Colo on 05/29/22 which revealed obstructing colonic mass.   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 Now awaiting surgical evaluation. Revised cardiac risk index 10.1% for major CV event. She is clinically compensated from a HF standpoint and has no known obstructive  CAD. Would not pursue ischemic evaluation due to lack of symptoms and would not change management as patient needs definitive treatment of her colonic mass and is high risk for DAPT due to anemia.   Management per surgery No further CV work-up needed prior to OR; revised cardiac risk index 10.1%   Persistent Afib Secondary hypercoagulable state  CHADs-vasc 5. Patient has known chronic Afib for which she has been maintained on rate control strategy. Presented on this admission with increasing weakness and SOB found to be in Afib with RVR with HR 110-130s. BP soft on admission and could not tolerate nodal agents and therefore amiodarone was started. She was also found to be anemic (9.7) with positive stool guaiac now found to have obstructing mass on colonoscopy. Xarelto was held and heparin was started.   oral amiodarone today, '200mg'$  BID x 2 weeks total then transition to 200 mg daily.  Heparin initially held due to worsening anemia however now with acute DVT of R brachial/axillary vein in setting of malignancy. Heparin cautiously resumed per primary Sustained elevated rates overnight, reload with digoxin today, 0.25 mg IV Q8 for 3 doses. Needs dig level per pharmacy.   Microcytic Anemia Low B12  HgB 9 on admission and was previously 13 in 2021. Fecal occult positive and found to have obstructing colonic mass on colonoscopy as above. HGB down to 7.9.  Management of colonic  mass per surgery consider transfusion if Hgb <8.  Generalized weakness/fatigue: Likely related to anemia and underlying malignancy as above.   Chronic Combined Systolic and Diastolic HF with Improved EF: EF dropped to 30-35% 2014 thought to be secondary to tachy induced CM in the setting of Afib with RVR. Improved back to 55-60% in 04/2022. Holding GDMT due to soft blood pressures.  Low dose metoprolol added by overnight fellow, can continue as tolerated prn, HR >120 Will add back GDMT as able pending blood  pressures Remains with 1+ puffy upper extremity edema and trace puffy LE edema. Would resume home lasix at a minimum. She is 4L positive.    HTN  Holding home meds in the setting of hypotension   CKD Stage II:  - Creatinine stable    HLD  - Continue lipitor '10mg'$  daily  For questions or updates, please contact College Please consult [email or web address] for contact info under

## 2022-06-03 NOTE — Progress Notes (Signed)
[facility] Progress Note  5 Days Post-Op  Subjective: Denies any current abdominal pain, nausea, vomiting. She denies any pain. Increased HR overnight.  Reloading with digoxin today.  Objective: Vital signs in last 24 hours: Temp:  [98.1 F (36.7 C)-99.1 F (37.3 C)] 98.8 F (37.1 C) (12/25 0817) Pulse Rate:  [97-136] 110 (12/25 0817) Resp:  [18-20] 20 (12/25 0817) BP: (94-136)/(50-113) 112/58 (12/25 0817) SpO2:  [94 %-100 %] 94 % (12/25 0817) Last BM Date : 06/02/22  Intake/Output from previous day: 12/24 0701 - 12/25 0700 In: 536.2 [IV Piggyback:536.2] Out: 551 [Urine:550; Stool:1] Intake/Output this shift: No intake/output data recorded.  PE: Gen:  Alert, NAD, pleasant Card:  irregular, tachy Pulm: rate and effort normal Abd: obese, soft, ND, NT  Lab Results:  Recent Labs    06/02/22 0623 06/02/22 1122 06/02/22 1950 06/03/22 0452  WBC 8.9  --   --  9.6  HGB 8.2*   < > 8.7* 8.4*  HCT 26.7*   < > 28.8* 27.5*  PLT 238  --   --  222   < > = values in this interval not displayed.   BMET Recent Labs    06/02/22 0626 06/03/22 0452  NA 139 136  K 3.8 4.0  CL 109 109  CO2 20* 22  GLUCOSE 112* 93  BUN 6* 6*  CREATININE 1.01* 0.90  CALCIUM 8.4* 7.9*   PT/INR No results for input(s): "LABPROT", "INR" in the last 72 hours. CMP     Component Value Date/Time   NA 136 06/03/2022 0452   NA 141 10/11/2021 1136   K 4.0 06/03/2022 0452   CL 109 06/03/2022 0452   CO2 22 06/03/2022 0452   GLUCOSE 93 06/03/2022 0452   BUN 6 (L) 06/03/2022 0452   BUN 14 10/11/2021 1136   CREATININE 0.90 06/03/2022 0452   CREATININE 0.99 03/11/2016 0911   CALCIUM 7.9 (L) 06/03/2022 0452   PROT 5.6 (L) 05/28/2022 0120   PROT 7.1 07/04/2020 1041   ALBUMIN 2.6 (L) 05/28/2022 0120   ALBUMIN 4.4 07/04/2020 1041   AST 17 05/28/2022 0120   ALT 7 05/28/2022 0120   ALKPHOS 76 05/28/2022 0120   BILITOT 0.6 05/28/2022 0120   BILITOT 1.1 07/04/2020 1041   GFRNONAA >60  06/03/2022 0452   GFRAA 55 (L) 07/04/2020 1041   Lipase  No results found for: "LIPASE"     Studies/Results: IR Fluoro Guide CV Line Right  Result Date: 06/02/2022 CLINICAL DATA:  Need for central venous access. Current right upper extremity PICC line needs to be removed due to development thrombus around the catheter. Also currently with superficial thrombophlebitis in the left upper extremity related to recent IV placement. EXAM: NON-TUNNELED CENTRAL VENOUS CATHETER PLACEMENT WITH ULTRASOUND AND FLUOROSCOPIC GUIDANCE FLUOROSCOPY: 18 seconds.  2.0 mGy. PROCEDURE: The procedure, risks, benefits, and alternatives were explained to the patient. Questions regarding the procedure were encouraged and answered. The patient understands and consents to the procedure. The right neck and chest were prepped with chlorhexidine in a sterile fashion, and a sterile drape was applied covering the operative field. Maximum barrier sterile technique with sterile gowns and gloves were used for the procedure. Local anesthesia was provided with 1% lidocaine. Ultrasound was used to confirm patency of the right internal jugular vein. A permanent ultrasound image was saved and recorded. After creating a small venotomy incision, a 21 gauge needle was advanced into the right internal jugular vein under direct, real-time ultrasound guidance. Ultrasound image documentation  was performed. After securing guidewire access, a peel-away sheath was placed over a guide wire. Utilizing guide wire measurement, a 5 [place], dual-lumen power injectable non tunneled catheter was cut to 16 cm. The catheter was then placed through the sheath and the sheath removed. Final catheter positioning was confirmed and documented with a fluoroscopic spot image. The catheter was aspirated and flushed with saline. The catheter exit site was secured with 0-Prolene retention sutures. COMPLICATIONS: None.  No pneumothorax. FINDINGS: After catheter placement,  the tip lies at the cavoatrial junction. The catheter aspirates normally and is ready for immediate use. IMPRESSION: Placement of non-tunneled central venous catheter via the right internal jugular vein. The catheter tip lies at the cavoatrial junction. The catheter is ready for immediate use. Electronically Signed   By: Aletta Edouard M.D.   On: 06/02/2022 12:22   IR US Guide Vasc Access Right  Result Date: 06/02/2022 CLINICAL DATA:  Need for central venous access. Current right upper extremity PICC line needs to be removed due to development thrombus around the catheter. Also currently with superficial thrombophlebitis in the left upper extremity related to recent IV placement. EXAM: NON-TUNNELED CENTRAL VENOUS CATHETER PLACEMENT WITH ULTRASOUND AND FLUOROSCOPIC GUIDANCE FLUOROSCOPY: 18 seconds.  2.0 mGy. PROCEDURE: The procedure, risks, benefits, and alternatives were explained to the patient. Questions regarding the procedure were encouraged and answered. The patient understands and consents to the procedure. The right neck and chest were prepped with chlorhexidine in a sterile fashion, and a sterile drape was applied covering the operative field. Maximum barrier sterile technique with sterile gowns and gloves were used for the procedure. Local anesthesia was provided with 1% lidocaine. Ultrasound was used to confirm patency of the right internal jugular vein. A permanent ultrasound image was saved and recorded. After creating a small venotomy incision, a 21 gauge needle was advanced into the right internal jugular vein under direct, real-time ultrasound guidance. Ultrasound image documentation was performed. After securing guidewire access, a peel-away sheath was placed over a guide wire. Utilizing guide wire measurement, a 5 [place], dual-lumen power injectable non tunneled catheter was cut to 16 cm. The catheter was then placed through the sheath and the sheath removed. Final catheter positioning was  confirmed and documented with a fluoroscopic spot image. The catheter was aspirated and flushed with saline. The catheter exit site was secured with 0-Prolene retention sutures. COMPLICATIONS: None.  No pneumothorax. FINDINGS: After catheter placement, the tip lies at the cavoatrial junction. The catheter aspirates normally and is ready for immediate use. IMPRESSION: Placement of non-tunneled central venous catheter via the right internal jugular vein. The catheter tip lies at the cavoatrial junction. The catheter is ready for immediate use. Electronically Signed   By: Aletta Edouard M.D.   On: 06/02/2022 12:22   VAS [place] UPPER EXTREMITY VENOUS DUPLEX  Result Date: 06/01/2022 UPPER VENOUS STUDY  Patient Name:  Natasha Dickson  Date of Exam:   06/01/2022 Medical Rec #: [id number]      Accession #:    [id number] Date of Birth: [birth year]      Patient Gender: F Patient Age:   76 years Exam Location:  [facility] Procedure:      VAS [place] UPPER EXTREMITY VENOUS DUPLEX Referring Phys: Vance Gather --------------------------------------------------------------------------------  Indications: Swelling Risk Factors: None identified. Limitations: Bandages and line. Comparison Study: No prior studies. Performing Technologist: Oliver Hum RVT  Examination Guidelines: A complete evaluation includes B-mode imaging, spectral Doppler, color Doppler, and power Doppler as needed of  all accessible portions of each vessel. Bilateral testing is considered an integral part of a complete examination. Limited examinations for reoccurring indications may be performed as noted.  Right Findings: +----------+------------+---------+-----------+----------+-------+ RIGHT     CompressiblePhasicitySpontaneousPropertiesSummary +----------+------------+---------+-----------+----------+-------+ IJV           Full       Yes       Yes                      +----------+------------+---------+-----------+----------+-------+  Subclavian    Full       Yes       Yes                      +----------+------------+---------+-----------+----------+-------+ Axillary      None       No        No                Acute  +----------+------------+---------+-----------+----------+-------+ Brachial    Partial      No        No                Acute  +----------+------------+---------+-----------+----------+-------+ Radial        Full                                          +----------+------------+---------+-----------+----------+-------+ Ulnar         Full                                          +----------+------------+---------+-----------+----------+-------+ Cephalic      Full                                          +----------+------------+---------+-----------+----------+-------+ Basilic       None                                   Acute  +----------+------------+---------+-----------+----------+-------+  Left Findings: +----------+------------+---------+-----------+----------+-------+ LEFT      CompressiblePhasicitySpontaneousPropertiesSummary +----------+------------+---------+-----------+----------+-------+ IJV           Full       Yes       Yes                      +----------+------------+---------+-----------+----------+-------+ Subclavian    Full       Yes       Yes                      +----------+------------+---------+-----------+----------+-------+ Axillary      Full       Yes       Yes                      +----------+------------+---------+-----------+----------+-------+ Brachial      Full       Yes       Yes                      +----------+------------+---------+-----------+----------+-------+ Radial        Full                                          +----------+------------+---------+-----------+----------+-------+  Ulnar         Full                                           +----------+------------+---------+-----------+----------+-------+ Cephalic      None                                   Acute  +----------+------------+---------+-----------+----------+-------+ Basilic       Full                                          +----------+------------+---------+-----------+----------+-------+  Summary:  Right: Findings consistent with acute deep vein thrombosis involving the right axillary vein and right brachial veins. Findings consistent with acute superficial vein thrombosis involving the right basilic vein.  Left: Findings consistent with acute superficial vein thrombosis involving the left cephalic vein.  *See table(s) above for measurements and observations.  Diagnosing physician: Deitra Mayo MD Electronically signed by Deitra Mayo MD on 06/01/2022 at 4:28:35 PM.    Final     Anti-infectives: Anti-infectives (From admission, onward)    Start     Dose/Rate Route Frequency Ordered Stop   06/04/22 0600  cefoTEtan (CEFOTAN) 2 g in sodium chloride 0.9 % 100 mL IVPB        2 g 200 mL/hr over 30 Minutes Intravenous On call to O.R. 06/03/22 7829 06/05/22 0559   06/03/22 1400  neomycin (MYCIFRADIN) tablet 1,000 mg       See Hyperspace for full Linked Orders Report.   1,000 mg Oral 3 times per day 06/03/22 0841 06/04/22 1359   06/03/22 1400  metroNIDAZOLE (FLAGYL) tablet 1,000 mg       See Hyperspace for full Linked Orders Report.   1,000 mg Oral 3 times per day 06/03/22 0841 06/04/22 1359   05/31/22 0600  cefoTEtan (CEFOTAN) 2 g in sodium chloride 0.9 % 100 mL IVPB        2 g 200 mL/hr over 30 Minutes Intravenous On call to O.R. 05/30/22 1716 06/01/22 0559   05/30/22 2200  neomycin (MYCIFRADIN) tablet 1,000 mg       See Hyperspace for full Linked Orders Report.   1,000 mg Oral 3 times per day 05/30/22 1716 05/30/22 2238   05/30/22 2200  metroNIDAZOLE (FLAGYL) tablet 1,000 mg       See Hyperspace for full Linked Orders Report.   1,000 mg  Oral 3 times per day 05/30/22 1716 05/30/22 2128        Assessment/Plan Colon Mass, adenocarcinoma from surgical path  - Colonoscopy 12/20 showed completely obstructing tumor in the cecum and at the ileocecal valve, along with polyps of the transverse, descending, and sigmoid colon -polyps are all benign except mass at ICV with adenocarcinoma - CEA normal at 4.1. - CT chest w/o evidence of mets. CT a/p subcentimeter mesenteric lymph nodes, no abnormalities seen in the liver - Cardiology following and recs No further CV work-up needed prior to OR, revised cardiac risk index 10.1%  -CLD, pre-op colorectal order set ordered.  She will get abx bowel prep, and pre-op ensure drinks, etc.  Plan for OR tomorrow as long as she remains stable and doesn't need to be held from cardiac standpoint  FEN - CLD, Ensure, NPO p 0430am on 12/26. VTE - SCDs, Lovenox '80mg'$  BID, hold after tonight's dose for OR tomorrow ID - None   A. Fib on Xarelto (last dose 12/18) CHF (30-35% 2014, now 55-60% in 04/2022) HTN HLD CKD2   I reviewed Consultant cards and hospitalist notes, last 24 h vitals and pain scores, last 48 h intake and output, last 24 h labs and trends, and last 24 h imaging results.    LOS: 7 days    Henreitta Cea, [facility] 06/03/2022, 9:17 AM Please see Amion for pager number during day hours 7:00am-4:30pm

## 2022-06-03 NOTE — Plan of Care (Signed)
  Problem: Education: Goal: Knowledge of General Education information will improve Description: Including pain rating scale, medication(s)/side effects and non-pharmacologic comfort measures Outcome: Progressing   Problem: Health Behavior/Discharge Planning: Goal: Ability to manage health-related needs will improve Outcome: Progressing   Problem: Clinical Measurements: Goal: Ability to maintain clinical measurements within normal limits will improve Outcome: Progressing   Problem: Clinical Measurements: Goal: Respiratory complications will improve Outcome: Progressing   Problem: Clinical Measurements: Goal: Cardiovascular complication will be avoided Outcome: Progressing   Problem: Activity: Goal: Risk for activity intolerance will decrease Outcome: Progressing   Problem: Nutrition: Goal: Adequate nutrition will be maintained Outcome: Progressing   Problem: Coping: Goal: Level of anxiety will decrease Outcome: Progressing   Problem: Bowel/Gastric: Goal: Gastrointestinal status for postoperative course will improve Outcome: Progressing

## 2022-06-03 NOTE — Progress Notes (Signed)
Brief cardiology note  Patient with sustained HR in 130s overnight in atrial fibrillation. Asymptomatic. BP originally soft at 90/57, but increased to 136/113 with stimulation. Chart reviewed - patient has known atrial fibrillation. Currently on PO amiodarone. Was on beta blocker but it was held for soft BP. TTE shows normal biventricular function 1 month ago.  A/P Unclear etiology for faster HR, but suspect anemia is playing a role (Hgb 8.9 on last check) 2/2 colonic mass. TSH/T4 is wnl. Patient is not having signs or symptoms of infection. Suspect that low BP might be due to inadequate diastolic filling time. Therefore, will treat to lower HR. - Will start metoprolol 6.25 mg PO q6h. Can decrease dose if SBP <90 or if patient symptomatic - EKG - Continue amiodarone 200 mg PO - IV NS 500 cc  Natasha Dubonnet MD MPH [facility]

## 2022-06-03 NOTE — Progress Notes (Signed)
[facility] PROGRESS NOTE  HARUKA KOWALESKI (DOB: 04-Oct-1945) ION:[id number] PCP: Tonia Ghent, MD  Brief Narrative: Natasha Dickson is a 76 y.o. female with medical history significant of atrial fibrillation on xarelto, systolic HF (recovered EF), non ischemic cardiomyopathy, hypertension and multiple other medical issues here with progressive generalized weakness.  Found to have new anemia and + FOBT as well as AFib with RVR. She was admitted with cardiology consultations. GI took for EGD/colonoscopy 12/20 which showed a "malignant completely obstructing tumor in the cecum and at the ileocecal valve," which has been found to be invasive adenocarcinoma. There were polyps in the transverse, descending, and sigmoid colon which were also sent to pathology ultimately ruling out for malignancy. CT abd/pelvis showed mesenteric LNs, no liver lesions. Rate control has been complicated by hypotension for which cardiology is consulted, maintaining marginal rate control with amiodarone with soft BP. She has received IV iron and 1u PRBCs 12/20 for progressive anemia and had heparin held due to this. Due to continued difficulty with venous access, PICC line placed, subsequently developed DVT of right brachial and axillary veins as well as SVT of bilateral cephalic veins for which therapeutic anticoagulation is reinitiated. Serial hemoglobin checks are ongoing. Surgery is planning colectomy 12/26 as OR schedule allows pending hemodynamic stability.   Subjective: Sleeping this morning but alert and interactive. No chest pain dyspnea or palpitations, no abd pain, N/V. No further dark stools overnight per pt. Had soft BP overnight, got some IVF. HR remains rapid, denies dizziness.  Objective: BP (!) 112/58 (BP Location: Right Leg)   Pulse (!) 110   Temp 98.8 F (37.1 C) (Oral)   Resp 20   Ht '5\' 1"'$  (1.549 m)   Wt 93 kg   SpO2 94%   BMI 38.74 kg/m   Gen: No distress Pulm: Clear nonlabored CV: Rapid irreg,  +edema, no JVD GI: Soft, NT, ND, +BS Neuro: Alert and oriented. No new focal deficits. Ext: Warm, no deformities Developing increasing pitting edema Skin: Stable UE edema R > L with stable, slightly improved palpable induration on left forearm. Nontender, no spreading erythema. No new rashes, lesions or ulcers on visualized skin   Assessment & Plan: Invasive moderately differentiated adenocarcinoma of the cecum: Other polyps tested negative for malignancy. CT abd/pelvis staging scan showed subcentimeter mesenteric LNs and 4.5cm x 2.6cm thickening of cecum close to ileocecal junction. No hepatic abnormalities. CEA oddly only 3.6 > 4.1.  - PT and family have discussed with surgery, medicine team, and cardiology team multidisciplinary at the bedside 12/22. They know of significant risk of morbidity and mortality with surgery as well as suspected inevitable progression of the mass leading to obstruction and continued bleeding. ACS risk calculator used to quantify and specify risks per general surgery.  - Surgery note says advance diet as tolerated, continue full liquids. Currently planning on hemicolectomy 12/26 or will be on call if urgently needed. Discussed risk of obstruction and perforation with daughter. Currently benign abdomen.  - Note no further work up recommended by cardiology, RCRI 10.1%.  - Does not have an acute abdomen at this time. Will continue close monitoring.   Mild cognitive impairment: Based on symptom report prior to hospitalization. The patient does have capacity to make most medical decisions most of the time, but this is not guaranteed with hospital delirium.  - Any procedures or interventions requiring informed consent should include discussion with the patient's daughter and/or husband.  Acute on chronic combined HFrEF with recovered EF,  HTN: Appears roughly euvolemic and taking limited po so was given IVF.  - Holding IVF for now, suspect restarting lasix will be necessary  now. Dw cardiology who agrees.  - Metoprolol on hold due to soft BP per cardiology - Soft BP limiting other GDMT at this time. Holding home BP meds.  - Coronary calcifications noted on scans, though deferring ischemic evaluation at this time.  Acute right brachial/axillary DVT, acute right cephalic and left cephalic SVT: In the setting of malignancy, remains hypercoagulable.  - Don't see an option to avoid anticoagulation at this point despite known colon mass bleeding. This does not appear to be brisk despite black stools. This is in the setting of, and I suspect due to, oral iron supplementation. Would not expect a brisk bleed from the colon even right sided to oxidize considerably enough to present as anything other than red bleeding. Will check H/H serially as we initiate lovenox as discussed above.  - Continue lovenox, monitor H/H serially. No clinical evidence of bleeding but will monitor with continued hypotension. Transfusion threshold remains 8g/dl or more  Persistent AFib with RVR:  - CHA2DS2-VASc score is 5. Anticoagulation as above - Rates elevated on amiodarone alone, giving digoxin per cardiology. Don't think BP would currently sustain metoprolol but defer to cardiology.  - Continue amiodarone, converted IV > PO 12/22.  Symptomatic iron deficiency, acute on chronic blood loss anemia: Ferritin is 7, iron 15, 3% sat, TIBC up to 535. Suspect thrombocytosis is reactive to this. s/p 1u PRBC 12/22. - IV iron given, stopped oral iron with dark stools complicating ability to discern GI bleeding. - Hgb up relatively stable since, will monitor serially.  - H/H q6h, transfuse prn hgb 8g/dl or less.   Vitamin B12 deficiency: Level is 166. - IM supplemented daily x7 days, now weekly  HLD: CK low.  - Statin  Stage IIIa CKD: Based on available CrCl values.  - Avoid nephrotoxins as able. CrCl holding stable, still even improving some, indicative of adequate perfusion pressure.   Obesity:  Body mass index is 38.74 kg/m.   Hypokalemia: At goal.   [name], MD [facility] [email or web address] 06/03/2022, 8:54 AM

## 2022-06-04 ENCOUNTER — Encounter (HOSPITAL_COMMUNITY)
Admission: EM | Disposition: A | Discharge status: Skilled Nursing Facility | Payer: Self-pay | Source: Home / Self Care | Attending: Family Medicine

## 2022-06-04 ENCOUNTER — Inpatient Hospital Stay (HOSPITAL_COMMUNITY): Payer: Medicare Other

## 2022-06-04 ENCOUNTER — Other Ambulatory Visit: Payer: Self-pay

## 2022-06-04 ENCOUNTER — Inpatient Hospital Stay (HOSPITAL_COMMUNITY): Payer: Medicare Other | Admitting: Anesthesiology

## 2022-06-04 DIAGNOSIS — C180 Malignant neoplasm of cecum: Secondary | ICD-10-CM

## 2022-06-04 DIAGNOSIS — I5032 Chronic diastolic (congestive) heart failure: Secondary | ICD-10-CM | POA: Diagnosis not present

## 2022-06-04 DIAGNOSIS — I4891 Unspecified atrial fibrillation: Secondary | ICD-10-CM | POA: Diagnosis not present

## 2022-06-04 DIAGNOSIS — I110 Hypertensive heart disease with heart failure: Secondary | ICD-10-CM

## 2022-06-04 DIAGNOSIS — E538 Deficiency of other specified B group vitamins: Secondary | ICD-10-CM | POA: Diagnosis not present

## 2022-06-04 DIAGNOSIS — K6389 Other specified diseases of intestine: Secondary | ICD-10-CM | POA: Diagnosis not present

## 2022-06-04 DIAGNOSIS — K56699 Other intestinal obstruction unspecified as to partial versus complete obstruction: Secondary | ICD-10-CM

## 2022-06-04 DIAGNOSIS — I509 Heart failure, unspecified: Secondary | ICD-10-CM

## 2022-06-04 HISTORY — PX: CHOLECYSTECTOMY: SHX55

## 2022-06-04 LAB — TYPE AND SCREEN
ABO/RH(D): O POS
Antibody Screen: NEGATIVE

## 2022-06-04 LAB — HEMOGLOBIN A1C
Hgb A1c MFr Bld: 5.6 % (ref 4.8–5.6)
Mean Plasma Glucose: 114 mg/dL

## 2022-06-04 LAB — BASIC METABOLIC PANEL
Anion gap: 7 (ref 5–15)
BUN: 8 mg/dL (ref 8–23)
CO2: 22 mmol/L (ref 22–32)
Calcium: 8 mg/dL — ABNORMAL LOW (ref 8.9–10.3)
Chloride: 107 mmol/L (ref 98–111)
Creatinine, Ser: 0.89 mg/dL (ref 0.44–1.00)
GFR, Estimated: >60 mL/min (ref >60)
Glucose, Bld: 83 mg/dL (ref 70–99)
Potassium: 4.4 mmol/L (ref 3.5–5.1)
Sodium: 136 mmol/L (ref 135–145)

## 2022-06-04 LAB — CBC
HCT: 30.1 % — ABNORMAL LOW (ref 36.0–46.0)
Hemoglobin: 9 g/dL — ABNORMAL LOW (ref 12.0–15.0)
MCH: 24.9 pg — ABNORMAL LOW (ref 26.0–34.0)
MCHC: 29.9 g/dL — ABNORMAL LOW (ref 30.0–36.0)
MCV: 83.1 fL (ref 80.0–100.0)
Platelets: 222 10*3/uL (ref 150–400)
RBC: 3.62 MIL/uL — ABNORMAL LOW (ref 3.87–5.11)
RDW: 21.2 % — ABNORMAL HIGH (ref 11.5–15.5)
WBC: 8.1 10*3/uL (ref 4.0–10.5)
nRBC: 0.9 % — ABNORMAL HIGH (ref 0.0–0.2)

## 2022-06-04 SURGERY — LAPAROSCOPIC CHOLECYSTECTOMY
Anesthesia: General | Site: Abdomen | Laterality: Right

## 2022-06-04 MED ORDER — METHOCARBAMOL 1000 MG/10ML IJ SOLN: 500.0000 mg | Freq: Four times a day (QID) | INTRAVENOUS | Status: DC | PRN

## 2022-06-04 MED ORDER — PROPOFOL 10 MG/ML IV BOLUS
INTRAVENOUS | Status: DC | PRN
  Administered 2022-06-04: 90 mg via INTRAVENOUS

## 2022-06-04 MED ORDER — DOCUSATE SODIUM 100 MG PO CAPS
100.0000 mg | ORAL_CAPSULE | Freq: Two times a day (BID) | ORAL | Status: DC
  Administered 2022-06-04 – 2022-06-07 (×7): 100 mg via ORAL
  Filled 2022-06-04 (×8): qty 1

## 2022-06-04 MED ORDER — GABAPENTIN 600 MG PO TABS
300.0000 mg | ORAL_TABLET | Freq: Three times a day (TID) | ORAL | Status: DC
  Administered 2022-06-04 – 2022-06-06 (×8): 300 mg via ORAL
  Filled 2022-06-04 (×4): qty 1
  Filled 2022-06-04: qty 0.5
  Filled 2022-06-04 (×4): qty 1

## 2022-06-04 MED ORDER — 0.9 % SODIUM CHLORIDE (POUR BTL) OPTIME
TOPICAL | Status: DC | PRN
  Administered 2022-06-04 (×2): 1000 mL

## 2022-06-04 MED ORDER — ESMOLOL HCL 100 MG/10ML IV SOLN
INTRAVENOUS | Status: AC
  Filled 2022-06-04: qty 10

## 2022-06-04 MED ORDER — ONDANSETRON HCL 4 MG/2ML IJ SOLN
INTRAMUSCULAR | Status: AC
  Filled 2022-06-04: qty 2

## 2022-06-04 MED ORDER — LIDOCAINE 2% (20 MG/ML) 5 ML SYRINGE
INTRAMUSCULAR | Status: DC | PRN
  Administered 2022-06-04: 60 mg via INTRAVENOUS

## 2022-06-04 MED ORDER — SIMETHICONE 80 MG PO CHEW
80.0000 mg | CHEWABLE_TABLET | Freq: Four times a day (QID) | ORAL | Status: DC | PRN
  Administered 2022-06-05: 80 mg via ORAL
  Filled 2022-06-04 (×3): qty 1

## 2022-06-04 MED ORDER — DEXAMETHASONE SODIUM PHOSPHATE 10 MG/ML IJ SOLN
INTRAMUSCULAR | Status: AC
  Filled 2022-06-04: qty 1

## 2022-06-04 MED ORDER — ACETAMINOPHEN 325 MG PO TABS
650.0000 mg | ORAL_TABLET | Freq: Four times a day (QID) | ORAL | Status: DC
  Administered 2022-06-04 – 2022-06-12 (×30): 650 mg via ORAL
  Filled 2022-06-04 (×31): qty 2

## 2022-06-04 MED ORDER — ONDANSETRON HCL 4 MG/2ML IJ SOLN: 4.0000 mg | Freq: Once | INTRAMUSCULAR | Status: DC | PRN

## 2022-06-04 MED ORDER — PROPOFOL 10 MG/ML IV BOLUS
INTRAVENOUS | Status: AC
  Filled 2022-06-04: qty 20

## 2022-06-04 MED ORDER — LIDOCAINE 2% (20 MG/ML) 5 ML SYRINGE
INTRAMUSCULAR | Status: AC
  Filled 2022-06-04: qty 5

## 2022-06-04 MED ORDER — HYDROMORPHONE HCL 1 MG/ML IJ SOLN: 0.2500 mg | INTRAMUSCULAR | Status: DC | PRN

## 2022-06-04 MED ORDER — PHENYLEPHRINE 80 MCG/ML (10ML) SYRINGE FOR IV PUSH (FOR BLOOD PRESSURE SUPPORT)
PREFILLED_SYRINGE | INTRAVENOUS | Status: DC | PRN
  Administered 2022-06-04: 160 ug via INTRAVENOUS

## 2022-06-04 MED ORDER — CEFAZOLIN SODIUM-DEXTROSE 2-3 GM-%(50ML) IV SOLR
INTRAVENOUS | Status: DC | PRN
  Administered 2022-06-04: 2 g via INTRAVENOUS

## 2022-06-04 MED ORDER — ALBUMIN HUMAN 5 % IV SOLN: INTRAVENOUS | Status: DC | PRN

## 2022-06-04 MED ORDER — HYDROMORPHONE HCL 1 MG/ML IJ SOLN
INTRAMUSCULAR | Status: AC
  Filled 2022-06-04: qty 0.5

## 2022-06-04 MED ORDER — HYDROMORPHONE HCL 1 MG/ML IJ SOLN
INTRAMUSCULAR | Status: DC | PRN
  Administered 2022-06-04 (×2): .5 mg via INTRAVENOUS

## 2022-06-04 MED ORDER — PHENYLEPHRINE HCL-NACL 20-0.9 MG/250ML-% IV SOLN
INTRAVENOUS | Status: DC | PRN
  Administered 2022-06-04: 50 ug/min via INTRAVENOUS

## 2022-06-04 MED ORDER — PROCHLORPERAZINE EDISYLATE 10 MG/2ML IJ SOLN: 10.0000 mg | INTRAMUSCULAR | Status: DC | PRN

## 2022-06-04 MED ORDER — BUPIVACAINE HCL 0.25 % IJ SOLN: INTRAMUSCULAR | Status: DC | PRN

## 2022-06-04 MED ORDER — OXYCODONE HCL 5 MG PO TABS: 10.0000 mg | ORAL_TABLET | ORAL | Status: DC | PRN

## 2022-06-04 MED ORDER — ALVIMOPAN 12 MG PO CAPS
12.0000 mg | ORAL_CAPSULE | Freq: Two times a day (BID) | ORAL | Status: DC
  Administered 2022-06-07 – 2022-06-10 (×7): 12 mg via ORAL
  Filled 2022-06-04 (×13): qty 1

## 2022-06-04 MED ORDER — SUGAMMADEX SODIUM 200 MG/2ML IV SOLN
INTRAVENOUS | Status: DC | PRN
  Administered 2022-06-04 (×2): 200 mg via INTRAVENOUS

## 2022-06-04 MED ORDER — OXYCODONE HCL 5 MG PO TABS
5.0000 mg | ORAL_TABLET | ORAL | Status: DC | PRN
  Administered 2022-06-04: 5 mg via ORAL
  Filled 2022-06-04: qty 1

## 2022-06-04 MED ORDER — DEXAMETHASONE SODIUM PHOSPHATE 10 MG/ML IJ SOLN
INTRAMUSCULAR | Status: DC | PRN
  Administered 2022-06-04: 10 mg via INTRAVENOUS

## 2022-06-04 MED ORDER — SUCCINYLCHOLINE CHLORIDE 200 MG/10ML IV SOSY
PREFILLED_SYRINGE | INTRAVENOUS | Status: DC | PRN
  Administered 2022-06-04: 120 mg via INTRAVENOUS

## 2022-06-04 MED ORDER — SODIUM CHLORIDE 0.9 % IR SOLN
Status: DC | PRN
  Administered 2022-06-04: 1000 mL

## 2022-06-04 MED ORDER — ROCURONIUM BROMIDE 10 MG/ML (PF) SYRINGE
PREFILLED_SYRINGE | INTRAVENOUS | Status: AC
  Filled 2022-06-04: qty 10

## 2022-06-04 MED ORDER — AMISULPRIDE (ANTIEMETIC) 5 MG/2ML IV SOLN: 10.0000 mg | Freq: Once | INTRAVENOUS | Status: DC | PRN

## 2022-06-04 MED ORDER — FENTANYL CITRATE (PF) 250 MCG/5ML IJ SOLN
INTRAMUSCULAR | Status: DC | PRN
  Administered 2022-06-04 (×2): 50 ug via INTRAVENOUS
  Administered 2022-06-04: 100 ug via INTRAVENOUS
  Administered 2022-06-04: 50 ug via INTRAVENOUS

## 2022-06-04 MED ORDER — ONDANSETRON HCL 4 MG/2ML IJ SOLN
INTRAMUSCULAR | Status: DC | PRN
  Administered 2022-06-04: 4 mg via INTRAVENOUS

## 2022-06-04 MED ORDER — ENOXAPARIN SODIUM 80 MG/0.8ML IJ SOSY: 80.0000 mg | PREFILLED_SYRINGE | Freq: Two times a day (BID) | INTRAMUSCULAR | Status: DC

## 2022-06-04 MED ORDER — FENTANYL CITRATE (PF) 250 MCG/5ML IJ SOLN
INTRAMUSCULAR | Status: AC
  Filled 2022-06-04: qty 5

## 2022-06-04 MED ORDER — ONDANSETRON HCL 4 MG/2ML IJ SOLN
4.0000 mg | Freq: Four times a day (QID) | INTRAMUSCULAR | Status: DC | PRN
  Administered 2022-06-04 – 2022-06-05 (×2): 4 mg via INTRAVENOUS
  Filled 2022-06-04 (×3): qty 2

## 2022-06-04 MED ORDER — HYDROMORPHONE HCL 1 MG/ML IJ SOLN: 0.5000 mg | INTRAMUSCULAR | Status: DC | PRN

## 2022-06-04 MED ORDER — FUROSEMIDE 20 MG PO TABS
20.0000 mg | ORAL_TABLET | Freq: Once | ORAL | Status: AC
  Administered 2022-06-04: 20 mg via ORAL
  Filled 2022-06-04: qty 1

## 2022-06-04 MED ORDER — LACTATED RINGERS IV SOLN: INTRAVENOUS | Status: DC | PRN

## 2022-06-04 MED ORDER — BUPIVACAINE HCL (PF) 0.25 % IJ SOLN
INTRAMUSCULAR | Status: AC
  Filled 2022-06-04: qty 30

## 2022-06-04 MED ORDER — ROCURONIUM BROMIDE 10 MG/ML (PF) SYRINGE
PREFILLED_SYRINGE | INTRAVENOUS | Status: DC | PRN
  Administered 2022-06-04: 100 mg via INTRAVENOUS

## 2022-06-04 SURGICAL SUPPLY — 76 items
ADH SKN CLS APL DERMABOND .7 (GAUZE/BANDAGES/DRESSINGS) ×1
APL PRP STRL LF DISP 70% ISPRP (MISCELLANEOUS) ×1
BAG COUNTER SPONGE SURGICOUNT (BAG) ×1 IMPLANT
BAG SPNG CNTER NS LX DISP (BAG) ×1
BLADE SURG 10 STRL SS (BLADE) IMPLANT
CANISTER SUCT 3000ML PPV (MISCELLANEOUS) ×1 IMPLANT
CHLORAPREP W/TINT 26 (MISCELLANEOUS) ×1 IMPLANT
CLIP LIGATING HEMO LOK XL GOLD (MISCELLANEOUS) IMPLANT
COVER MAYO STAND STRL (DRAPES) IMPLANT
COVER SURGICAL LIGHT HANDLE (MISCELLANEOUS) ×1 IMPLANT
DERMABOND ADVANCED .7 DNX12 (GAUZE/BANDAGES/DRESSINGS) ×1 IMPLANT
DRAPE HALF SHEET 40X57 (DRAPES) IMPLANT
DRAPE UTILITY XL STRL (DRAPES) IMPLANT
DRAPE WARM FLUID 44X44 (DRAPES) IMPLANT
ELECT BLADE 6.5 EXT (BLADE) IMPLANT
ELECT CAUTERY BLADE 6.4 (BLADE) IMPLANT
ELECT REM PT RETURN 9FT ADLT (ELECTROSURGICAL) ×1
ELECTRODE REM PT RTRN 9FT ADLT (ELECTROSURGICAL) ×1 IMPLANT
GLOVE BIO SURGEON STRL SZ 6 (GLOVE) IMPLANT
GLOVE BIO SURGEON STRL SZ7.5 (GLOVE) ×1 IMPLANT
GLOVE BIOGEL PI IND STRL 6 (GLOVE) IMPLANT
GLOVE BIOGEL PI IND STRL 6.5 (GLOVE) IMPLANT
GLOVE BIOGEL PI IND STRL 8 (GLOVE) ×1 IMPLANT
GLOVE BIOGEL PI MICRO STRL 6 (GLOVE) IMPLANT
GOWN STRL REUS W/ TWL LRG LVL3 (GOWN DISPOSABLE) ×2 IMPLANT
GOWN STRL REUS W/ TWL XL LVL3 (GOWN DISPOSABLE) ×1 IMPLANT
GOWN STRL REUS W/TWL LRG LVL3 (GOWN DISPOSABLE) ×2
GOWN STRL REUS W/TWL XL LVL3 (GOWN DISPOSABLE) ×1
GRASPER SUT TROCAR 14GX15 (MISCELLANEOUS) ×1 IMPLANT
HANDLE SUCTION POOLE (INSTRUMENTS) IMPLANT
IRRIG SUCT STRYKERFLOW 2 WTIP (MISCELLANEOUS) ×1
IRRIGATION SUCT STRKRFLW 2 WTP (MISCELLANEOUS) IMPLANT
KIT BASIN OR (CUSTOM PROCEDURE TRAY) ×1 IMPLANT
KIT TURNOVER KIT B (KITS) ×1 IMPLANT
NDL 22X1.5 STRL (OR ONLY) (MISCELLANEOUS) ×1 IMPLANT
NDL INSUFFLATION 14GA 120MM (NEEDLE) ×1 IMPLANT
NEEDLE 22X1.5 STRL (OR ONLY) (MISCELLANEOUS) ×1 IMPLANT
NEEDLE INSUFFLATION 14GA 120MM (NEEDLE) ×1 IMPLANT
NS IRRIG 1000ML POUR BTL (IV SOLUTION) ×1 IMPLANT
PAD ARMBOARD 7.5X6 YLW CONV (MISCELLANEOUS) ×1 IMPLANT
PENCIL BUTTON HOLSTER BLD 10FT (ELECTRODE) IMPLANT
PENCIL SMOKE EVACUATOR (MISCELLANEOUS) IMPLANT
RELOAD STAPLE 60 2.6 WHT THN (STAPLE) IMPLANT
RELOAD STAPLE 60 3.6 BLU REG (STAPLE) IMPLANT
RELOAD STAPLER BLUE 60MM (STAPLE) ×2 IMPLANT
RELOAD STAPLER WHITE 60MM (STAPLE) ×2 IMPLANT
SCISSORS LAP 5X35 DISP (ENDOMECHANICALS) ×1 IMPLANT
SEALER TISSUE X1 STRG JAW 37MM (SHEATH) IMPLANT
SET IRRIG TUBING LAPAROSCOPIC (IRRIGATION / IRRIGATOR) ×1 IMPLANT
SET TUBE SMOKE EVAC HIGH FLOW (TUBING) ×1 IMPLANT
SLEEVE ADV FIXATION 5X100MM (TROCAR) IMPLANT
SPECIMEN JAR SMALL (MISCELLANEOUS) ×1 IMPLANT
SPONGE T-LAP 18X18 ~~LOC~~+RFID (SPONGE) IMPLANT
STAPLE ECHEON FLEX 60 POW ENDO (STAPLE) IMPLANT
STAPLER RELOAD BLUE 60MM (STAPLE) ×2
STAPLER RELOAD WHITE 60MM (STAPLE) ×2
SUCTION POOLE HANDLE (INSTRUMENTS) ×1
SUT MNCRL AB 4-0 PS2 18 (SUTURE) ×1 IMPLANT
SUT PDS AB 0 CT 36 (SUTURE) IMPLANT
SUT SILK 2 0 SH CR/8 (SUTURE) IMPLANT
SUT SILK 2 0 TIES 10X30 (SUTURE) IMPLANT
SUT SILK 3 0 SH CR/8 (SUTURE) IMPLANT
SUT SILK 3 0 TIES 10X30 (SUTURE) IMPLANT
SUT VIC AB 2-0 SH 18 (SUTURE) IMPLANT
SYR BULB IRRIG 60ML STRL (SYRINGE) IMPLANT
SYS LAPSCP GELPORT 120MM (MISCELLANEOUS) ×1
SYSTEM LAPSCP GELPORT 120MM (MISCELLANEOUS) IMPLANT
TOWEL GREEN STERILE (TOWEL DISPOSABLE) ×1 IMPLANT
TOWEL GREEN STERILE FF (TOWEL DISPOSABLE) ×1 IMPLANT
TRAY LAPAROSCOPIC MC (CUSTOM PROCEDURE TRAY) ×1 IMPLANT
TROCAR ADV FIXATION 5X100MM (TROCAR) IMPLANT
TROCAR BALLN 12MMX100 BLUNT (TROCAR) IMPLANT
TUBE CONNECTING 12X1/4 (SUCTIONS) IMPLANT
WARMER LAPAROSCOPE (MISCELLANEOUS) ×1 IMPLANT
WATER STERILE IRR 1000ML POUR (IV SOLUTION) ×1 IMPLANT
YANKAUER SUCT BULB TIP NO VENT (SUCTIONS) IMPLANT

## 2022-06-04 NOTE — Transfer of Care (Signed)
Immediate Anesthesia Transfer of Care Note  Patient: Natasha Dickson  Procedure(s) Performed: LAPAROSCOPIC RIGHT [place] (Right: Abdomen)  Patient Location: PACU  Anesthesia Type:General  Level of Consciousness: drowsy and patient cooperative  Airway & Oxygen Therapy: Patient Spontanous Breathing  Post-op Assessment: Report given to RN and Post -op Vital signs reviewed and stable  Post vital signs: Reviewed and stable  Last Vitals:  Vitals Value Taken Time  BP    Temp    Pulse    Resp    SpO2      Last Pain:  Vitals:   06/04/22 0413  TempSrc: Oral  PainSc:       Patients Stated Pain Goal: 1 (55/37/48 2707)  Complications: No notable events documented.

## 2022-06-04 NOTE — Op Note (Signed)
Patient: Natasha Dickson (1945-11-09, [id number])  Date of Surgery: 06/04/22  Preoperative Diagnosis: Obstructing cecal adenocarcinoma  Postoperative Diagnosis: Obstructing cecal adenocarcinoma  Surgical Procedure:  LAPAROSCOPIC HAND ASSISTED RIGHT COLECTOMY:    Operative Team Members:  Surgeon(s) and Role:    * Natasha Dickson, Natasha Major, MD - Primary   Anesthesiologist: Natasha Igo, MD CRNA: Natasha Coon, CRNA; Natasha Cove, CRNA   Anesthesia: Choice   Fluids:  Total I/O In: 5102 [I.V.:1600; IV Piggyback:250] Out: 200 [Urine:100; HENID:782]  Complications: None  Drains:  none   Specimen:  ID Type Source Tests Collected by Time Destination  1 : Right colon Tissue PATH GI Other SURGICAL PATHOLOGY Natasha Dickson, Natasha Major, MD 06/04/2022 0912      Disposition:  PACU - hemodynamically stable.  Plan of Care:  Continue inpatient care    Indications for Procedure: Natasha Dickson is a 76 y.o. female who was found on colonoscopy to have a near obstructing cecal adenocarcinoma.  Laparoscopic, hand assisted, right colectomy was recommended for the patient.  The procedure itself as well as its risks, benefits and alternatives were discussed.  The risks discussed included but were not limited to the risk of infection, bleeding, damage to nearby structures, and anastomotic leak.  After a full discussion and all questions answered the patient granted consent to proceed.  Findings: Right colon mass   Description of Procedure:   On the date see above the patient was taken the operating room suite and placed in supine position.  General endotracheal anesthesia was induced.  A timeout was completed verifying the correct patient, procedure, position, and equipment needed for the case.  The patient's abdomen was prepped and draped in usual sterile fashion.  Made a midline incision periumbilically about the width of my hand.  I dissected out a fat-containing umbilical hernia.  The fat  contained in the hernia was amputated using the Enseal device.  The fat was returned to the abdomen.  The hernia defect was used as a portion of the incision.  A midline laparotomy incision about the width of my hand was created and the GelPort was placed in this position.  A 12 mm trocar was placed through the GelPort and the abdomen was insufflated to 15 mmHg.  2 additional trocars were placed 1 in the right lower quadrant laterally and 1 in the right lower quadrant medially.  While we worked we reposition the bed from left side down with Trendelenburg to left-side-down with reverse Trendelenburg to aid with retraction of the viscera.  I use a combination of laparoscopic surgery, laparoscopic hand-assisted surgery, and open surgery through the GelPort to resect the right colon.  The right colic artery was dissected high in the colon mesentery.  2 clips were placed on the artery proximally it was divided with the Enseal distally.  The right colon was medialized by dividing the attachments to the lateral abdominal wall.  I worked up the [employer] using the Enseal device to divide the tissue.  The attachments between the hepatic flexure of the colon and the gallbladder were divided.  The omentum was lifted off the transverse colon and I worked to free up the colon up to the middle colic artery.  The mesocolon was divided using the Enseal device.  The terminal ileum was divided with a blue load of the Ethicon endoscopic linear stapler 60 mm.  The proximal transverse colon was divided in similar fashion with a blue load of the Ethicon endoscopic linear stapler  60 mm.  The specimen was delivered through the midline incision.  We created our anastomosis and open fashion through the midline incision.  A stay suture was placed on the tinea of the stapled transverse colon and the antimesenteric border of the stapled terminal ileum.  Enterotomy was made in the antimesenteric border of the terminal ileum and the  tinea of the colon.  A white load of the 60 mm Ethicon endoscopic linear stapler was inserted and fired to create the anastomosis.  The common enterotomy was closed using a white load of the endoscopic linear stapler.  The staple line was oversewn with a Vicryl suture tacking fatty tissue over the staple line.  A Vicryl suture was placed at the crotch of the staple line to reinforce the anastomosis.  The mesenteric defect was very large so it was not closed.  The abdomen was irrigated with saline irrigation.  We then converted to the clean closure tray and redraped the patient and re gowned and gloved.  The midline incision was closed using #1 PDS suture in running fashion at the fascial level.  The wound was irrigated with saline.  The deep dermal layer was closed with Vicryl suture.  Monocryl and Dermabond was used to close the skin.  All sponge and needle counts were correct at the end this case.  At the end of the case we reviewed the infection status of the case. Patient: Natasha Dickson Emergency General Surgery Service Patient Case: Urgent Infection Present At Time Of Surgery (PATOS):  Contamination related to creating a ileocolic anastomosis.  Natasha Raw, MD General, Bariatric, & Minimally Invasive Surgery [facility], [place]

## 2022-06-04 NOTE — Progress Notes (Signed)
[facility] PROGRESS NOTE  Natasha Dickson (DOB: June 23, 1945) WFU:[id number] PCP: Tonia Ghent, MD  Brief Narrative: Natasha Dickson is a 76 y.o. female with medical history significant of atrial fibrillation on xarelto, systolic HF (recovered EF), non ischemic cardiomyopathy, hypertension and multiple other medical issues here with progressive generalized weakness.  Found to have new anemia and + FOBT as well as AFib with RVR. She was admitted with cardiology consultations. GI took for EGD/colonoscopy 12/20 which showed a "malignant completely obstructing tumor in the cecum and at the ileocecal valve," which has been found to be invasive adenocarcinoma. There were polyps in the transverse, descending, and sigmoid colon which were also sent to pathology ultimately ruling out for malignancy. CT abd/pelvis showed mesenteric LNs, no liver lesions. Rate control has been complicated by hypotension for which cardiology is consulted, maintaining marginal rate control with amiodarone with soft BP. She has received IV iron and 1u PRBCs 12/20 for progressive anemia and had heparin held due to this. Due to continued difficulty with venous access, PICC line placed, subsequently developed DVT of right brachial and axillary veins as well as SVT of bilateral cephalic veins for which therapeutic anticoagulation was reinitiated and PICC discontinued, right IJ inserted 12/24. Surgery performed hemicolectomy 12/26.  Subjective: Seen postop, has abdominal pain. Has been having more cough/chest congestion over past 24 hours or so, no hypoxia or dyspnea reported.   Objective: BP 139/81   Pulse 72   Temp 98.4 F (36.9 C)   Resp 10   Ht '5\' 1"'$  (1.549 m)   Wt 93.9 kg   SpO2 99%   BMI 39.11 kg/m   Gen: Elderly female in some pain Pulm: Crackles at bases bilaterally, nonlabored though SpO2 dipping into high 80%'s as she sleeps  CV: Irreg, rate in 70-90's. 1+ pitting dependent edema. GI: Surgical wounds well apposed  without discharge. Neuro: Alert and oriented. No new focal deficits. Ext: Warm, no deformities Skin: No other rashes, lesions or ulcers on visualized skin   Assessment & Plan: Invasive moderately differentiated adenocarcinoma of the cecum: Other polyps tested negative for malignancy. CT abd/pelvis staging scan showed subcentimeter mesenteric LNs and 4.5cm x 2.6cm thickening of cecum close to ileocecal junction. No hepatic abnormalities. CEA oddly only 3.6 > 4.1. Now s/p laparoscopic hand-assisted right colectomy by Dr. Thermon Leyland 12/26.  - Has multimodal pain regimen ordered - Wound care per surgery.  - DC foley per postop routine - Follow up with oncology after discharge.  Mild cognitive impairment: Based on symptom report prior to hospitalization. The patient does have capacity to make most medical decisions most of the time, but this is not guaranteed with hospital delirium.  - Any procedures or interventions requiring informed consent should include discussion with the patient's daughter and/or husband.  Acute on chronic combined HFrEF with recovered EF, HTN: Needed IVF support while taking inadequate PO.  - Continue lasix '20mg'$  po daily. Give an additional PM dose with increased edema and crackles by exam - Metoprolol on hold due to soft BP per cardiology - Soft BP limiting other GDMT at this time. Holding home BP meds.  - Coronary calcifications noted on scans, though deferring ischemic evaluation at this time.  Acute hypoxic respiratory failure: Suspect pulmonary edema worsening due to intraoperative fluids, though had increased cough prior. WBC stable/normal, afebrile. - Check CXR - Provide supplemental O2 to maintain SpO2 >89% with normal WOB - Redose lasix this PM  Acute right brachial/axillary DVT, acute right cephalic and left  cephalic SVT: In the setting of malignancy, remains hypercoagulable.  - Continue lovenox. Will restart in AM per discussion with surgery, Dr.  Thermon Leyland.   Persistent AFib with RVR:  - CHA2DS2-VASc score is 5. Anticoagulation as above - Rates improved with digoxin. BP normalizing over past 24 hours. - Continue amiodarone, converted IV > PO 12/22.  Symptomatic iron deficiency, acute on chronic blood loss anemia: Ferritin is 7, iron 15, 3% sat, TIBC up to 535. Suspect thrombocytosis is reactive to this. s/p 1u PRBC 12/22. - IV iron given, stopped oral iron with dark stools complicating ability to discern GI bleeding. Stools now [place]. - Hgb up relatively stable since, will monitor serially.  Vitamin B12 deficiency: Level is 166. - IM supplemented daily x7 days, now weekly  HLD: CK low.  - Statin  Stage IIIa CKD: Based on available CrCl values.  - Avoid nephrotoxins as able.  - CrCl continues improvement  Obesity: Body mass index is 39.11 kg/m.   Hypokalemia: At goal.   [name], MD [facility] [email or web address] 06/04/2022, 4:13 PM

## 2022-06-04 NOTE — Plan of Care (Signed)
  Problem: Education: Goal: Knowledge of General Education information will improve Description: Including pain rating scale, medication(s)/side effects and non-pharmacologic comfort measures Outcome: Progressing   Problem: Health Behavior/Discharge Planning: Goal: Ability to manage health-related needs will improve Outcome: Progressing   Problem: Clinical Measurements: Goal: Respiratory complications will improve Outcome: Progressing   Problem: Clinical Measurements: Goal: Cardiovascular complication will be avoided Outcome: Progressing   Problem: Activity: Goal: Risk for activity intolerance will decrease Outcome: Progressing   Problem: Nutrition: Goal: Adequate nutrition will be maintained Outcome: Progressing   Problem: Coping: Goal: Level of anxiety will decrease Outcome: Progressing   Problem: Pain Managment: Goal: General experience of comfort will improve Outcome: Progressing   Problem: Safety: Goal: Ability to remain free from injury will improve Outcome: Progressing   Problem: Skin Integrity: Goal: Risk for impaired skin integrity will decrease Outcome: Progressing   

## 2022-06-04 NOTE — Anesthesia Procedure Notes (Signed)
Procedure Name: Intubation Date/Time: 06/04/2022 7:52 AM  Performed by: Lance Coon, CRNAPre-anesthesia Checklist: Patient identified, Emergency Drugs available, Suction available, Patient being monitored and Timeout performed Patient Re-evaluated:Patient Re-evaluated prior to induction Oxygen Delivery Method: Circle system utilized Preoxygenation: Pre-oxygenation with 100% oxygen Induction Type: IV induction, Rapid sequence and Cricoid Pressure applied Laryngoscope Size: Miller and 3 Grade View: Grade I Tube type: Oral Tube size: 7.0 mm Number of attempts: 1 Airway Equipment and Method: Stylet Placement Confirmation: ETT inserted through vocal cords under direct vision, positive ETCO2 and breath sounds checked- equal and bilateral Secured at: 21 cm Tube secured with: Tape Dental Injury: Teeth and Oropharynx as per pre-operative assessment

## 2022-06-04 NOTE — Progress Notes (Signed)
Progress Note: General Surgery Service   Chief Complaint/Subjective: Anticipating surgery  Objective: Vital signs in last 24 hours: Temp:  [98 F (36.7 C)-99.1 F (37.3 C)] 99.1 F (37.3 C) (12/26 0413) Pulse Rate:  [91-110] 100 (12/26 0413) Resp:  [18-20] 18 (12/26 0413) BP: (112-167)/(54-97) 115/54 (12/26 0413) SpO2:  [93 %-100 %] 93 % (12/26 0413) Weight:  [93.9 kg] 93.9 kg (12/26 0413) Last BM Date : 06/03/22  Intake/Output from previous day: 12/25 0701 - 12/26 0700 In: 710 [P.O.:710] Out: 550 [Urine:550] Intake/Output this shift: No intake/output data recorded.  Constitutional: NAD; conversant; no deformities Eyes: Moist conjunctiva; no lid lag; anicteric; PERRL Neck: Trachea midline; no thyromegaly Lungs: Normal respiratory effort; no tactile fremitus CV: RRR; no palpable thrills; no pitting edema GI: Abd Soft, nontender; no palpable hepatosplenomegaly MSK: Normal range of motion of extremities; no clubbing/cyanosis Psychiatric: Appropriate affect; alert and oriented x3 Lymphatic: No palpable cervical or axillary lymphadenopathy  Lab Results: CBC  Recent Labs    06/03/22 0452 06/04/22 0232  WBC 9.6 8.1  HGB 8.4* 9.0*  HCT 27.5* 30.1*  PLT 222 222   BMET Recent Labs    06/03/22 0452 06/04/22 0232  NA 136 136  K 4.0 4.4  CL 109 107  CO2 22 22  GLUCOSE 93 83  BUN 6* 8  CREATININE 0.90 0.89  CALCIUM 7.9* 8.0*   PT/INR No results for input(s): "LABPROT", "INR" in the last 72 hours. ABG No results for input(s): "PHART", "HCO3" in the last 72 hours.  Invalid input(s): "PCO2", "PO2"  Anti-infectives: Anti-infectives (From admission, onward)    Start     Dose/Rate Route Frequency Ordered Stop   06/04/22 0600  cefoTEtan (CEFOTAN) 2 g in sodium chloride 0.9 % 100 mL IVPB        2 g 200 mL/hr over 30 Minutes Intravenous On call to O.R. 06/03/22 0539 06/05/22 0559   06/03/22 1400  neomycin (MYCIFRADIN) tablet 1,000 mg       See Hyperspace for full  Linked Orders Report.   1,000 mg Oral 3 times per day 06/03/22 0841 06/03/22 2159   06/03/22 1400  metroNIDAZOLE (FLAGYL) tablet 1,000 mg       See Hyperspace for full Linked Orders Report.   1,000 mg Oral 3 times per day 06/03/22 0841 06/03/22 2116   05/31/22 0600  cefoTEtan (CEFOTAN) 2 g in sodium chloride 0.9 % 100 mL IVPB        2 g 200 mL/hr over 30 Minutes Intravenous On call to O.R. 05/30/22 1716 06/01/22 0559   05/30/22 2200  neomycin (MYCIFRADIN) tablet 1,000 mg       See Hyperspace for full Linked Orders Report.   1,000 mg Oral 3 times per day 05/30/22 1716 05/30/22 2238   05/30/22 2200  metroNIDAZOLE (FLAGYL) tablet 1,000 mg       See Hyperspace for full Linked Orders Report.   1,000 mg Oral 3 times per day 05/30/22 1716 05/30/22 2128       Medications: Scheduled Meds:  [MAR Hold] alteplase  2 mg Intracatheter Once   [MAR Hold] alteplase  2 mg Intracatheter Once   [MAR Hold] amiodarone  200 mg Oral BID   [MAR Hold] atorvastatin  10 mg Oral Daily   bupivacaine liposome  20 mL Infiltration Once   [MAR Hold] Chlorhexidine Gluconate Cloth  6 each Topical Daily   [MAR Hold] cyanocobalamin  1,000 mcg Intramuscular Q Sun   [MAR Hold] feeding supplement  237 mL Oral  TID BM   [MAR Hold] folic acid  1 mg Oral Daily   [MAR Hold] furosemide  20 mg Oral Daily   [MAR Hold] mupirocin ointment  1 Application Nasal BID   [MAR Hold] sodium chloride flush  10-40 mL Intracatheter Q12H   Continuous Infusions:  cefoTEtan (CEFOTAN) IV     PRN Meds:.[MAR Hold] acetaminophen **OR** [MAR Hold] acetaminophen, [MAR Hold] metoprolol tartrate, [MAR Hold] sodium chloride flush  Assessment/Plan: Natasha Dickson is a 76 year old female with a near-obstructing colon mass near the ileocecal valve.  I recommended laparoscopic assisted right colectomy.  The procedure, its risks, benefits and alteratives were discussed and the patient granted consent to proceed.  We will proceed as scheduled.   LOS: 8 days     Felicie Morn, [facility] Use [email or web address] to contact on call provider

## 2022-06-04 NOTE — Progress Notes (Signed)
Spoke to patient's daughter Margaretha Sheffield. She requested to sign the consent for the surgery due to the intermittent confusion of the patient. Consent secured. No further concerns. Upon assessment, patient is alert and oriented x 4, forgetful. Plan of care ongoing.

## 2022-06-04 NOTE — Anesthesia Preprocedure Evaluation (Addendum)
Anesthesia Evaluation  Patient identified by MRN, date of birth, ID band Patient awake    Reviewed: Allergy & Precautions, NPO status , Patient's Chart, lab work & pertinent test results, reviewed documented beta blocker date and time   Airway Mallampati: II  TM Distance: >3 FB Neck ROM: Full    Dental  (+) Teeth Intact, Dental Advisory Given   Pulmonary neg pulmonary ROS   Pulmonary exam normal breath sounds clear to auscultation       Cardiovascular hypertension, Pt. on medications and Pt. on home beta blockers +CHF and + DVT  + dysrhythmias Atrial Fibrillation  Rhythm:Regular Rate:Tachycardia  Hx/o NICM Chronic persistent Atrial fibrillation DVT right UE at PICC line insertion, Thrombophlebitis LUE  EKG 06/03/22 Atrial fibrillation with RVR 131/min  Echo: 04/2022 1. Left ventricular ejection fraction, by estimation, is 55 to 60%. The  left ventricle has normal function. The left ventricle has no regional  wall motion abnormalities. Left ventricular diastolic parameters are  indeterminate.   2. Right ventricular systolic function is normal. The right ventricular  size is normal. There is normal pulmonary artery systolic pressure.   3. Left atrial size was mildly dilated.   4. The mitral valve is normal in structure. Mild mitral valve  regurgitation. No evidence of mitral stenosis.   5. Tricuspid valve regurgitation is mild to moderate.   6. The aortic valve is tricuspid. Aortic valve regurgitation is not  visualized. Aortic valve sclerosis is present, with no evidence of aortic  valve stenosis.   7. The inferior vena cava is normal in size with greater than 50%  respiratory variability, suggesting right atrial pressure of 3 mmHg.     Neuro/Psych  Neuromuscular disease  negative psych ROS   GI/Hepatic Neg liver ROS, hiatal hernia,,,Colon obstruction   Endo/Other  negative endocrine ROS  Obesity Hyperlipidemia Hx/o  Breast Ca  Renal/GU Renal disease  negative genitourinary   Musculoskeletal negative musculoskeletal ROS (+)    Abdominal  (+) + obese  Peds  Hematology  (+) Blood dyscrasia, anemia Xarelto therapy- last dose 05/26/22   Anesthesia Other Findings   Reproductive/Obstetrics                              Anesthesia Physical Anesthesia Plan  ASA: 3  Anesthesia Plan: MAC   Post-op Pain Management: Minimal or no pain anticipated   Induction: Intravenous  PONV Risk Score and Plan: 0 and Propofol infusion  Airway Management Planned: Natural Airway and Simple Face Mask  Additional Equipment: None  Intra-op Plan:   Post-operative Plan:   Informed Consent: I have reviewed the patients History and Physical, chart, labs and discussed the procedure including the risks, benefits and alternatives for the proposed anesthesia with the patient or authorized representative who has indicated his/her understanding and acceptance.       Plan Discussed with: CRNA  Anesthesia Plan Comments:          Anesthesia Quick Evaluation

## 2022-06-04 NOTE — Anesthesia Postprocedure Evaluation (Signed)
Anesthesia Post Note  Patient: Natasha Dickson  Procedure(s) Performed: LAPAROSCOPIC HAND ASSISTED RIGHT COLECTOMY (Right: Abdomen)     Patient location during evaluation: PACU Anesthesia Type: General Level of consciousness: awake and alert and oriented Pain management: pain level controlled Vital Signs Assessment: post-procedure vital signs reviewed and stable Respiratory status: spontaneous breathing, nonlabored ventilation and respiratory function stable Cardiovascular status: blood pressure returned to baseline and stable Postop Assessment: no apparent nausea or vomiting Anesthetic complications: no   No notable events documented.  Last Vitals:  Vitals:   06/04/22 1030 06/04/22 1045  BP: (!) 159/76 (!) 145/76  Pulse: (!) 106 (!) 112  Resp: 14 19  Temp:  36.9 C  SpO2: 93% 93%    Last Pain:  Vitals:   06/04/22 1030  TempSrc:   PainSc: Asleep                 Frayda Egley A.

## 2022-06-04 NOTE — Anesthesia Procedure Notes (Signed)
Arterial Line Insertion Start/End12/26/2023 7:10 AM, 06/04/2022 7:20 AM Performed by: Lance Coon, CRNA, CRNA  Patient location: Pre-op. Preanesthetic checklist: patient identified, IV checked, site marked, risks and benefits discussed, surgical consent, monitors and equipment checked, pre-op evaluation, timeout performed and anesthesia consent Lidocaine 1% used for infiltration Left, radial was placed Catheter size: 20 G Hand hygiene performed  and maximum sterile barriers used   Attempts: 2 Procedure performed without using ultrasound guided technique. Following insertion, dressing applied and Biopatch. Post procedure assessment: normal and unchanged  Patient tolerated the procedure well with no immediate complications.

## 2022-06-05 ENCOUNTER — Encounter (HOSPITAL_COMMUNITY): Payer: Self-pay | Admitting: Surgery

## 2022-06-05 DIAGNOSIS — I4891 Unspecified atrial fibrillation: Secondary | ICD-10-CM | POA: Diagnosis not present

## 2022-06-05 LAB — CBC
HCT: 26.1 % — ABNORMAL LOW (ref 36.0–46.0)
Hemoglobin: 8.1 g/dL — ABNORMAL LOW (ref 12.0–15.0)
MCH: 25.2 pg — ABNORMAL LOW (ref 26.0–34.0)
MCHC: 31 g/dL (ref 30.0–36.0)
MCV: 81.1 fL (ref 80.0–100.0)
Platelets: 221 10*3/uL (ref 150–400)
RBC: 3.22 MIL/uL — ABNORMAL LOW (ref 3.87–5.11)
RDW: 20.7 % — ABNORMAL HIGH (ref 11.5–15.5)
WBC: 11.4 10*3/uL — ABNORMAL HIGH (ref 4.0–10.5)
nRBC: 0.3 % — ABNORMAL HIGH (ref 0.0–0.2)

## 2022-06-05 LAB — HEMOGLOBIN AND HEMATOCRIT, BLOOD
HCT: 26.8 % — ABNORMAL LOW (ref 36.0–46.0)
Hemoglobin: 8.4 g/dL — ABNORMAL LOW (ref 12.0–15.0)

## 2022-06-05 LAB — BASIC METABOLIC PANEL
Anion gap: 6 (ref 5–15)
BUN: 9 mg/dL (ref 8–23)
CO2: 23 mmol/L (ref 22–32)
Calcium: 7.9 mg/dL — ABNORMAL LOW (ref 8.9–10.3)
Chloride: 106 mmol/L (ref 98–111)
Creatinine, Ser: 0.91 mg/dL (ref 0.44–1.00)
GFR, Estimated: >60 mL/min (ref >60)
Glucose, Bld: 110 mg/dL — ABNORMAL HIGH (ref 70–99)
Potassium: 4.2 mmol/L (ref 3.5–5.1)
Sodium: 135 mmol/L (ref 135–145)

## 2022-06-05 LAB — DIGOXIN LEVEL: Digoxin Level: 1.1 ng/mL (ref 0.8–2.0)

## 2022-06-05 MED ORDER — ENOXAPARIN SODIUM 80 MG/0.8ML IJ SOSY: 80.0000 mg | PREFILLED_SYRINGE | Freq: Two times a day (BID) | INTRAMUSCULAR | Status: DC

## 2022-06-05 MED ORDER — GUAIFENESIN-DM 100-10 MG/5ML PO SYRP
5.0000 mL | ORAL_SOLUTION | ORAL | Status: DC | PRN
  Administered 2022-06-05 – 2022-06-12 (×10): 5 mL via ORAL
  Filled 2022-06-05 (×11): qty 5

## 2022-06-05 MED ORDER — ENOXAPARIN SODIUM 100 MG/ML IJ SOSY
90.0000 mg | PREFILLED_SYRINGE | Freq: Two times a day (BID) | INTRAMUSCULAR | Status: DC
  Administered 2022-06-05 – 2022-06-08 (×7): 90 mg via SUBCUTANEOUS
  Filled 2022-06-05 (×7): qty 1

## 2022-06-05 MED ORDER — FUROSEMIDE 10 MG/ML IJ SOLN
20.0000 mg | Freq: Once | INTRAMUSCULAR | Status: AC
  Administered 2022-06-05: 20 mg via INTRAVENOUS
  Filled 2022-06-05: qty 2

## 2022-06-05 NOTE — Progress Notes (Signed)
Patient ID: Natasha Dickson, female   DOB: Nov 07, 1945, 76 y.o.   MRN: [id number] [facility] Surgery Progress Note:   1 Day Post-Op   THE PLAN  Begin taking liquids as tolerated-currently she is feeling a bit of nausea.  Incision bland  Subjective: Mental status is alert.  Complaints none. Objective: Vital signs in last 24 hours: Temp:  [97.4 F (36.3 C)-98.9 F (37.2 C)] 98.3 F (36.8 C) (12/27 0852) Pulse Rate:  [69-113] 71 (12/27 0852) Resp:  [10-20] 16 (12/27 0852) BP: (116-163)/(47-84) 116/74 (12/27 0852) SpO2:  [90 %-100 %] 97 % (12/27 0852) Arterial Line BP: (114-136)/(48-54) 114/48 (12/26 1045) Weight:  [94.3 kg] 94.3 kg (12/27 0341)  Intake/Output from previous day: 12/26 0701 - 12/27 0700 In: 2090 [P.O.:240; I.V.:1600; IV Piggyback:250] Out: 1000 [Urine:900; Blood:100] Intake/Output this shift: No intake/output data recorded.  Physical Exam: Work of breathing is normal.  Incision bland.  + BM  Lab Results:  Results for orders placed or performed during the hospital encounter of 05/27/22 (from the past 48 hour(s))  Surgical pcr screen     Status: Abnormal   Collection Time: 06/03/22  8:12 PM   Specimen: Nasal Mucosa; Nasal Swab  Result Value Ref Range   MRSA, PCR NEGATIVE NEGATIVE   Staphylococcus aureus POSITIVE (A) NEGATIVE    Comment: (NOTE) The Xpert SA Assay (FDA approved for NASAL specimens in patients 78 years of age and older), is one component of a comprehensive surveillance program. It is not intended to diagnose infection nor to guide or monitor treatment. Performed at [facility], [place] [address] [place], [place] [address]   Basic metabolic panel     Status: Abnormal   Collection Time: 06/04/22  2:32 AM  Result Value Ref Range   Sodium 136 135 - 145 mmol/L   Potassium 4.4 3.5 - 5.1 mmol/L   Chloride 107 98 - 111 mmol/L   CO2 22 22 - 32 mmol/L   Glucose, Bld 83 70 - 99 mg/dL    Comment: Glucose reference range applies only to samples  taken after fasting for at least 8 hours.   BUN 8 8 - 23 mg/dL   Creatinine, Ser 0.89 0.44 - 1.00 mg/dL   Calcium 8.0 (L) 8.9 - 10.3 mg/dL   GFR, Estimated >60 >60 mL/min    Comment: (NOTE) Calculated using the CKD-EPI Creatinine Equation (2021)    Anion gap 7 5 - 15    Comment: Performed at [facility] [address] [place], [place] [address]  CBC     Status: Abnormal   Collection Time: 06/04/22  2:32 AM  Result Value Ref Range   WBC 8.1 4.0 - 10.5 K/uL   RBC 3.62 (L) 3.87 - 5.11 MIL/uL   Hemoglobin 9.0 (L) 12.0 - 15.0 g/dL   HCT 30.1 (L) 36.0 - 46.0 %   MCV 83.1 80.0 - 100.0 fL   MCH 24.9 (L) 26.0 - 34.0 pg   MCHC 29.9 (L) 30.0 - 36.0 g/dL   RDW 21.2 (H) 11.5 - 15.5 %   Platelets 222 150 - 400 K/uL   nRBC 0.9 (H) 0.0 - 0.2 %    Comment: Performed at [facility], [place] [address] [place], [place] [address]  Type and screen [place]     Status: None   Collection Time: 06/04/22  4:47 AM  Result Value Ref Range   ABO/RH(D) O POS    Antibody Screen NEG  Sample Expiration      06/07/2022,2359 Performed at [facility], [place] [address], [place], [place] [address]   Basic metabolic panel     Status: Abnormal   Collection Time: 06/05/22  3:57 AM  Result Value Ref Range   Sodium 135 135 - 145 mmol/L   Potassium 4.2 3.5 - 5.1 mmol/L   Chloride 106 98 - 111 mmol/L   CO2 23 22 - 32 mmol/L   Glucose, Bld 110 (H) 70 - 99 mg/dL    Comment: Glucose reference range applies only to samples taken after fasting for at least 8 hours.   BUN 9 8 - 23 mg/dL   Creatinine, Ser 0.91 0.44 - 1.00 mg/dL   Calcium 7.9 (L) 8.9 - 10.3 mg/dL   GFR, Estimated >60 >60 mL/min    Comment: (NOTE) Calculated using the CKD-EPI Creatinine Equation (2021)    Anion gap 6 5 - 15    Comment: Performed at [facility] [address], [place], [place] [address]  CBC     Status: Abnormal   Collection Time: 06/05/22  3:57 AM  Result Value Ref Range   WBC 11.4  (H) 4.0 - 10.5 K/uL   RBC 3.22 (L) 3.87 - 5.11 MIL/uL   Hemoglobin 8.1 (L) 12.0 - 15.0 g/dL   HCT 26.1 (L) 36.0 - 46.0 %   MCV 81.1 80.0 - 100.0 fL   MCH 25.2 (L) 26.0 - 34.0 pg   MCHC 31.0 30.0 - 36.0 g/dL   RDW 20.7 (H) 11.5 - 15.5 %   Platelets 221 150 - 400 K/uL   nRBC 0.3 (H) 0.0 - 0.2 %    Comment: Performed at [facility], [place] [address] [place], [place] [address]    Radiology/Results: DG CHEST PORT 1 VIEW  Result Date: 06/04/2022 CLINICAL DATA:  Cough EXAM: PORTABLE CHEST 1 VIEW COMPARISON:  05/27/2022 FINDINGS: Overall inspiratory effort is poor. Heart is mildly enlarged but stable. Aortic calcifications are again seen. Right jugular central line is noted with the tip in the mid right atrium. No pneumothorax is seen. No focal infiltrate or effusion is noted. IMPRESSION: Poor inspiratory effort when compare with the prior exam although no focal infiltrate is seen. No pneumothorax following central line placement is noted. Electronically Signed   By: Inez Catalina M.D.   On: 06/04/2022 19:02    Anti-infectives: Anti-infectives (From admission, onward)    Start     Dose/Rate Route Frequency Ordered Stop   06/04/22 0600  cefoTEtan (CEFOTAN) 2 g in sodium chloride 0.9 % 100 mL IVPB  Status:  Discontinued        2 g 200 mL/hr over 30 Minutes Intravenous On call to O.R. 06/03/22 7209 06/04/22 0956   06/03/22 1400  neomycin (MYCIFRADIN) tablet 1,000 mg       See Hyperspace for full Linked Orders Report.   1,000 mg Oral 3 times per day 06/03/22 0841 06/03/22 2159   06/03/22 1400  metroNIDAZOLE (FLAGYL) tablet 1,000 mg       See Hyperspace for full Linked Orders Report.   1,000 mg Oral 3 times per day 06/03/22 0841 06/03/22 2116   05/31/22 0600  cefoTEtan (CEFOTAN) 2 g in sodium chloride 0.9 % 100 mL IVPB        2 g 200 mL/hr over 30 Minutes Intravenous On call to O.R. 05/30/22 1716 06/01/22 0559   05/30/22 2200  neomycin (MYCIFRADIN) tablet 1,000 mg  See Hyperspace for  full Linked Orders Report.   1,000 mg Oral 3 times per day 05/30/22 1716 05/30/22 2238   05/30/22 2200  metroNIDAZOLE (FLAGYL) tablet 1,000 mg       See Hyperspace for full Linked Orders Report.   1,000 mg Oral 3 times per day 05/30/22 1716 05/30/22 2128       Assessment/Plan: Problem List: Patient Active Problem List   Diagnosis Date Noted   Colonic mass 05/29/2022   Iron deficiency anemia due to chronic blood loss 05/29/2022   Diverticulosis of colon without hemorrhage 05/29/2022   Adenomatous polyp of transverse colon 05/29/2022   Adenomatous polyp of descending colon 05/29/2022   Adenomatous polyp of sigmoid colon 05/29/2022   Gastritis and gastroduodenitis 05/29/2022   Hiatal hernia 05/29/2022   Microcytic anemia 05/28/2022   Fecal occult blood test positive 05/28/2022   Atrial fibrillation with RVR ([place]) 05/27/2022   B12 deficiency 05/27/2022   Weakness 05/27/2022   (HFpEF) heart failure with preserved ejection fraction ([place]) 05/27/2022   Edema 05/19/2022   Muscle pain 05/19/2022   Tremor 05/19/2022   Memory change 05/19/2022   Lower back pain 01/01/2021   Pain in right hip 01/20/2020   Pain with hip hemiarthroplasty ([place]) 12/29/2019   Sciatica 10/03/2017   Elevated serum creatinine 02/12/2017   Advance care planning 03/15/2016   Skin lesion 03/15/2016   Fall at home 03/15/2016   Healthcare maintenance 03/15/2016   Cough 03/21/2015   Morbid obesity ([place]) 10/21/2014   Chronic combined systolic and diastolic CHF, NYHA class 2 ([place]) 10/21/2014   CKD (chronic kidney disease) 10/21/2014   Weight loss 07/20/2013   Appetite loss 07/20/2013   Hypokalemia 02/23/2013   Nonischemic cardiomyopathy ([place])    A-fib ([place]) 02/19/2013   Congestive dilated cardiomyopathy ([place]) 02/19/2013   Acute combined systolic and diastolic heart failure ([place]) 02/18/2013   SOB (shortness of breath) 02/15/2013   Hip fracture ([place]) 03/27/2011   Abnormal Pap smear of cervix 03/27/2011    Hyperlipidemia 03/27/2011    She will advance liquids as tolerated.  Stable and where she should be on PD 1 1 Day Post-Op    LOS: 9 days   Matt B. Hassell Done, MD, [facility] [phone] to reach the surgeon on call.    06/05/2022 9:10 AM

## 2022-06-05 NOTE — TOC Progression Note (Signed)
Transition of Care [facility]) - Progression Note    Patient Details  Name: Natasha Dickson MRN: [id number] Date of Birth: 05-15-1946  Transition of Care [facility]) CM/SW [place], [place] Phone Number: 06/05/2022, 1:24 PM  Clinical Narrative:     TOC continuing to follow for disposition and discharge needs. Pt noted to have surgery yesterday, will await updated PT/OT recommendations. TOC available for further needs.        Expected Discharge Plan and Services                                               Social Determinants of Health (SDOH) Interventions SDOH Screenings   Food Insecurity: No Food Insecurity (10/24/2021)  Housing: Low Risk  (10/24/2021)  Transportation Needs: No Transportation Needs (10/24/2021)  Alcohol Screen: Low Risk  (10/24/2021)  Depression (PHQ2-9): Low Risk  (10/24/2021)  Financial Resource Strain: Low Risk  (10/24/2021)  Physical Activity: Inactive (10/24/2021)  Social Connections: Moderately Isolated (10/24/2021)  Stress: No Stress Concern Present (10/24/2021)  Tobacco Use: Low Risk  (06/05/2022)    Readmission Risk Interventions     No data to display

## 2022-06-05 NOTE — Progress Notes (Signed)
PROGRESS NOTE    Natasha Dickson  GYF:[id number] DOB: 1945-06-26 DOA: 05/27/2022 PCP: Tonia Ghent, MD  Chief Complaint  Patient presents with   Weakness    Brief Narrative:  Natasha Dickson is Natasha Dickson 76 y.o. female with medical history significant of atrial fibrillation on xarelto, systolic HF (recovered EF), non ischemic cardiomyopathy, hypertension and multiple other medical issues here with progressive generalized weakness.  Found to have new anemia and + FOBT as well as AFib with RVR. She was admitted with cardiology consultations. GI took for EGD/colonoscopy 12/20 which showed Natasha Dickson "malignant completely obstructing tumor in the cecum and at the ileocecal valve," which has been found to be invasive adenocarcinoma. There were polyps in the transverse, descending, and sigmoid colon which were also sent to pathology ultimately ruling out for malignancy. CT abd/pelvis showed mesenteric LNs, no liver lesions. Rate control has been complicated by hypotension for which cardiology is consulted, maintaining marginal rate control with amiodarone with soft BP. She has received IV iron and 1u PRBCs 12/20 for progressive anemia and had heparin held due to this. Due to continued difficulty with venous access, PICC line placed, subsequently developed DVT of right brachial and axillary veins as well as SVT of bilateral cephalic veins for which therapeutic anticoagulation was reinitiated and PICC discontinued, right IJ inserted 12/24. Surgery performed hemicolectomy 12/26.    Assessment & Plan:   Principal Problem:   Atrial fibrillation with RVR (HCC) Active Problems:   B12 deficiency   Weakness   (HFpEF) heart failure with preserved ejection fraction (HCC)   Microcytic anemia   Fecal occult blood test positive   Colonic mass   Iron deficiency anemia due to chronic blood loss   Diverticulosis of colon without hemorrhage   Adenomatous polyp of transverse colon   Adenomatous polyp of descending colon    Adenomatous polyp of sigmoid colon   Gastritis and gastroduodenitis   Hiatal hernia  Invasive moderately differentiated adenocarcinoma of the cecum: Other polyps tested negative for malignancy. CT abd/pelvis staging scan showed subcentimeter mesenteric LNs and 4.5cm x 2.6cm thickening of cecum close to ileocecal junction. No hepatic abnormalities. CEA oddly only 3.6 > 4.1. Now s/p laparoscopic hand-assisted right colectomy by Dr. Thermon Leyland 12/26.  - Has multimodal pain regimen ordered - Wound care per surgery.  - DC foley per postop routine - Follow up with oncology after discharge. - follow surgical pathology   Mild cognitive impairment: Based on symptom report prior to hospitalization. The patient does have capacity to make most medical decisions most of the time, but this is not guaranteed with hospital delirium.  - Any procedures or interventions requiring informed consent should include discussion with the patient's daughter and/or husband.   Acute on chronic combined HFrEF with recovered EF, HTN: Needed IVF support while taking inadequate PO.  - diuresis per cardiology, appreciate assistance - Metoprolol on hold due to soft BP per cardiology - Soft BP limiting other GDMT at this time. Holding home BP meds.  - Coronary calcifications noted on scans, though deferring ischemic evaluation at this time.   Acute hypoxic respiratory failure: Suspect pulmonary edema worsening due to intraoperative fluids, though had increased cough prior. WBC stable/normal, afebrile. - Check CXR (poor inspiratory effort, no focal infiltrate) - Provide supplemental O2 to maintain SpO2 >89% with normal WOB   Acute right brachial/axillary DVT, acute right cephalic and left cephalic SVT: In the setting of malignancy, remains hypercoagulable.  - continue lovenox - monitor Hb on anticoagulation  Persistent AFib with RVR:  - CHA2DS2-VASc score is 5. Anticoagulation as above - Rates improved with digoxin. BP  normalizing over past 24 hours. - Continue amiodarone, converted IV > PO 12/22. - appreciate cardiology recommendations, dig/amio per cards   Symptomatic iron deficiency, acute on chronic blood loss anemia: Ferritin is 7, iron 15, 3% sat, TIBC up to 535. Suspect thrombocytosis is reactive to this. s/p 1u PRBC 12/22. - IV iron given, stopped oral iron with dark stools complicating ability to discern GI bleeding. Stools now [name]. - Hgb fluctuating, will follow    Vitamin B12 deficiency: Level is 166. - IM supplemented daily x7 days, now weekly   HLD: CK low.  - Statin   Stage IIIa CKD: Based on available CrCl values.  - Avoid nephrotoxins as able.  - CrCl continues improvement   Obesity: Body mass index is 39.11 kg/m.    Hypokalemia: At goal.   Central line in place, R IJ 12/24    DVT prophylaxis: lovenox Code Status: full code Family Communication: husband, daughter  Disposition:   Status is: Inpatient Remains inpatient appropriate because: need for post op care   Consultants:  Cardiology Surgery GI  Procedures:  12/26 LAPAROSCOPIC HAND ASSISTED RIGHT COLECTOMY   12/23 Summary:    Right:  Findings consistent with acute deep vein thrombosis involving the right  axillary  vein and right brachial veins. Findings consistent with acute superficial  vein  thrombosis involving the right basilic vein.    Left:  Findings consistent with acute superficial vein thrombosis involving the  left  cephalic vein.   EGD - Preparation of the colon was fair, but adequate for the purposes of this study. - Malignant completely obstructing tumor in the cecum and at the ileocecal valve. Biopsied. Tattooed. - Two 4 to 5 mm polyps in the descending colon and in the transverse colon, removed with Natasha Dickson cold snare. Resected and retrieved. - One 8 mm polyp in the sigmoid colon, removed with Natasha Dickson cold snare. Resected and retrieved. - Diverticulosis in the sigmoid colon. - Non-bleeding  internal hemorrhoids. - Stool in the sigmoid colon and in the descending colon. recommendation - Return patient to hospital ward for ongoing care. - Clear liquid diet today pending surgical evaluation. If no plan for inpatient surgery, will advance diet. - Continue present medications. - Await pathology results. - Consult inpatient General Surgery - Consult inpatient General Surgery/Colorectal Surgery today. - CT abdomen/pelvis for staging. Completed CT chest on 05/27/2022. - CEA. - Ok to resume heparin gtt. - IV iron on this admission. - Continue B12 and folate. - Continue serial CBC checks.  EGD - Normal esophagus. - 3 cm hiatal hernia. - Gastritis. Biopsied. - Normal mucosa was found in the gastric fundus and in the gastric body. Biopsied. - Chrisette Man few gastric polyps. - Normal examined duodenum. Biopsied. Impression: - Perform Artesha Wemhoff colonoscopy today. - Will follow-up on pathology results. - Additional accommodations pending colonoscopy findings.  Antimicrobials:  Anti-infectives (From admission, onward)    Start     Dose/Rate Route Frequency Ordered Stop   06/04/22 0600  cefoTEtan (CEFOTAN) 2 g in sodium chloride 0.9 % 100 mL IVPB  Status:  Discontinued        2 g 200 mL/hr over 30 Minutes Intravenous On call to O.R. 06/03/22 7628 06/04/22 0956   06/03/22 1400  neomycin (MYCIFRADIN) tablet 1,000 mg       See Hyperspace for full Linked Orders Report.   1,000 mg Oral 3 times per  day 06/03/22 0841 06/03/22 2159   06/03/22 1400  metroNIDAZOLE (FLAGYL) tablet 1,000 mg       See Hyperspace for full Linked Orders Report.   1,000 mg Oral 3 times per day 06/03/22 0841 06/03/22 2116   05/31/22 0600  cefoTEtan (CEFOTAN) 2 g in sodium chloride 0.9 % 100 mL IVPB        2 g 200 mL/hr over 30 Minutes Intravenous On call to O.R. 05/30/22 1716 06/01/22 0559   05/30/22 2200  neomycin (MYCIFRADIN) tablet 1,000 mg       See Hyperspace for full Linked Orders Report.   1,000 mg Oral 3 times per  day 05/30/22 1716 05/30/22 2238   05/30/22 2200  metroNIDAZOLE (FLAGYL) tablet 1,000 mg       See Hyperspace for full Linked Orders Report.   1,000 mg Oral 3 times per day 05/30/22 1716 05/30/22 2128       Subjective: No new complaints Wants something more than clears  Objective: Vitals:   06/05/22 0341 06/05/22 0852 06/05/22 1618 06/05/22 1927  BP:  116/74 (!) 122/56 (!) 146/72  Pulse:  71 91 77  Resp:  '16 18 16  '$ Temp:  98.3 F (36.8 C) 98 F (36.7 C) 97.6 F (36.4 C)  TempSrc:  Oral Oral Oral  SpO2:  97% 96% 97%  Weight: 94.3 kg     Height:        Intake/Output Summary (Last 24 hours) at 06/05/2022 1940 Last data filed at 06/05/2022 1400 Gross per 24 hour  Intake 240 ml  Output 1751 ml  Net -1511 ml   Filed Weights   05/27/22 1121 06/04/22 0413 06/05/22 0341  Weight: 93 kg 93.9 kg 94.3 kg    Examination:  General exam: Appears calm and comfortable  Respiratory system: unlabored Cardiovascular system:RRR Gastrointestinal system: mildline abdominal incision, well appearing, c/d/I Central nervous system: Alert and oriented. No focal neurological deficits. Extremities: no LEE   Data Reviewed: I have personally reviewed following labs and imaging studies  CBC: Recent Labs  Lab 06/01/22 0015 06/02/22 0623 06/02/22 1122 06/02/22 1950 06/03/22 0452 06/04/22 0232 06/05/22 0357  WBC 8.1 8.9  --   --  9.6 8.1 11.4*  HGB 8.3* 8.2* 9.1* 8.7* 8.4* 9.0* 8.1*  HCT 27.7* 26.7* 28.9* 28.8* 27.5* 30.1* 26.1*  MCV 81.0 79.7*  --   --  81.8 83.1 81.1  PLT 279 238  --   --  222 222 992    Basic Metabolic Panel: Recent Labs  Lab 06/01/22 0015 06/02/22 0626 06/03/22 0452 06/04/22 0232 06/05/22 0357  NA 139 139 136 136 135  K 3.4* 3.8 4.0 4.4 4.2  CL 113* 109 109 107 106  CO2 21* 20* '22 22 23  '$ GLUCOSE 99 112* 93 83 110*  BUN 8 6* 6* 8 9  CREATININE 1.00 1.01* 0.90 0.89 0.91  CALCIUM 8.1* 8.4* 7.9* 8.0* 7.9*  MG 2.1  --   --   --   --      GFR: Estimated Creatinine Clearance: 55.1 mL/min (by C-G formula based on SCr of 0.91 mg/dL).  Liver Function Tests: No results for input(s): "AST", "ALT", "ALKPHOS", "BILITOT", "PROT", "ALBUMIN" in the last 168 hours.  CBG: No results for input(s): "GLUCAP" in the last 168 hours.   Recent Results (from the past 240 hour(s))  Resp panel by RT-PCR (RSV, Flu Kerin Cecchi&B, Covid) Anterior Nasal Swab     Status: None   Collection Time: 05/27/22 11:15 AM  Specimen: Anterior Nasal Swab  Result Value Ref Range Status   SARS Coronavirus 2 by RT PCR NEGATIVE NEGATIVE Final    Comment: (NOTE) SARS-CoV-2 target nucleic acids are NOT DETECTED.  The SARS-CoV-2 RNA is generally detectable in upper respiratory specimens during the acute phase of infection. The lowest concentration of SARS-CoV-2 viral copies this assay can detect is 138 copies/mL. Klinton Candelas negative result does not preclude SARS-Cov-2 infection and should not be used as the sole basis for treatment or other patient management decisions. Brienna Bass negative result may occur with  improper specimen collection/handling, submission of specimen other than nasopharyngeal swab, presence of viral mutation(s) within the areas targeted by this assay, and inadequate number of viral copies(<138 copies/mL). Romi Rathel negative result must be combined with clinical observations, patient history, and epidemiological information. The expected result is Negative.  Fact Sheet for Patients:  EntrepreneurPulse.com.au  Fact Sheet for Healthcare Providers:  IncredibleEmployment.be  This test is no t yet approved or cleared by the [place] FDA and  has been authorized for detection and/or diagnosis of SARS-CoV-2 by FDA under an Emergency Use Authorization (EUA). This EUA will remain  in effect (meaning this test can be used) for the duration of the COVID-19 declaration under Section 564(b)(1) of the Act, 21 U.S.C.section  360bbb-3(b)(1), unless the authorization is terminated  or revoked sooner.       Influenza Kellsey Sansone by PCR NEGATIVE NEGATIVE Final   Influenza B by PCR NEGATIVE NEGATIVE Final    Comment: (NOTE) The Xpert Xpress SARS-CoV-2/FLU/RSV plus assay is intended as an aid in the diagnosis of influenza from Nasopharyngeal swab specimens and should not be used as Elina Streng sole basis for treatment. Nasal washings and aspirates are unacceptable for Xpert Xpress SARS-CoV-2/FLU/RSV testing.  Fact Sheet for Patients: EntrepreneurPulse.com.au  Fact Sheet for Healthcare Providers: IncredibleEmployment.be  This test is not yet approved or cleared by the [place] FDA and has been authorized for detection and/or diagnosis of SARS-CoV-2 by FDA under an Emergency Use Authorization (EUA). This EUA will remain in effect (meaning this test can be used) for the duration of the COVID-19 declaration under Section 564(b)(1) of the Act, 21 U.S.C. section 360bbb-3(b)(1), unless the authorization is terminated or revoked.     Resp Syncytial Virus by PCR NEGATIVE NEGATIVE Final    Comment: (NOTE) Fact Sheet for Patients: EntrepreneurPulse.com.au  Fact Sheet for Healthcare Providers: IncredibleEmployment.be  This test is not yet approved or cleared by the [place] FDA and has been authorized for detection and/or diagnosis of SARS-CoV-2 by FDA under an Emergency Use Authorization (EUA). This EUA will remain in effect (meaning this test can be used) for the duration of the COVID-19 declaration under Section 564(b)(1) of the Act, 21 U.S.C. section 360bbb-3(b)(1), unless the authorization is terminated or revoked.  Performed at [facility], [place] [address], [place], [place] [address]   Surgical pcr screen     Status: Abnormal   Collection Time: 06/03/22  8:12 PM   Specimen: Nasal Mucosa; Nasal Swab  Result Value Ref Range Status    MRSA, PCR NEGATIVE NEGATIVE Final   Staphylococcus aureus POSITIVE (Colbey Wirtanen) NEGATIVE Final    Comment: (NOTE) The Xpert SA Assay (FDA approved for NASAL specimens in patients 58 years of age and older), is one component of Makella Buckingham comprehensive surveillance program. It is not intended to diagnose infection nor to guide or monitor treatment. Performed at [facility], [place] [address], [place], [place] [address]  Radiology Studies: DG CHEST PORT 1 VIEW  Result Date: 06/04/2022 CLINICAL DATA:  Cough EXAM: PORTABLE CHEST 1 VIEW COMPARISON:  05/27/2022 FINDINGS: Overall inspiratory effort is poor. Heart is mildly enlarged but stable. Aortic calcifications are again seen. Right jugular central line is noted with the tip in the mid right atrium. No pneumothorax is seen. No focal infiltrate or effusion is noted. IMPRESSION: Poor inspiratory effort when compare with the prior exam although no focal infiltrate is seen. No pneumothorax following central line placement is noted. Electronically Signed   By: Inez Catalina M.D.   On: 06/04/2022 19:02        Scheduled Meds:  acetaminophen  650 mg Oral Q6H   alteplase  2 mg Intracatheter Once   alteplase  2 mg Intracatheter Once   alvimopan  12 mg Oral BID   amiodarone  200 mg Oral BID   atorvastatin  10 mg Oral Daily   Chlorhexidine Gluconate Cloth  6 each Topical Daily   [START ON 06/09/2022] cyanocobalamin  1,000 mcg Intramuscular Q Sun   docusate sodium  100 mg Oral BID   enoxaparin (LOVENOX) injection  90 mg Subcutaneous Q12H   feeding supplement  237 mL Oral TID BM   folic acid  1 mg Oral Daily   gabapentin  300 mg Oral TID   mupirocin ointment  1 Application Nasal BID   sodium chloride flush  10-40 mL Intracatheter Q12H   Continuous Infusions:  methocarbamol (ROBAXIN) IV       LOS: 9 days    Time spent: over 30 min    Fayrene Helper, MD [facility]   To contact the attending provider between 7A-7P or the  covering provider during after hours 7P-7A, please log into the web site [email or web address] and access using universal [place] password for that web site. If you do not have the password, please call the hospital operator.  06/05/2022, 7:40 PM

## 2022-06-05 NOTE — Evaluation (Signed)
Physical Therapy RE-Evaluation Patient Details Name: Natasha Dickson MRN: [id number] DOB: [birth year] Today's Date: 06/05/2022  History of Present Illness  76 y.o. female who presented to the ED 12/18 with hypotension and weakness. 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  Pt s/p laporoscopic R colectomy on 06/04/22.  PMH: A. Fib with RVR on Xarelto (last dose 12/18, currently on heparin gtt), CHF (30-35% 2014, now 55-60% in 04/2022), HTN, HLD, and CKD2.  Clinical Impression  Pt now s/p laparoscopic colectomy and is moving around the room with RW and min assist.  She states her husband and daughter can assist her at discharge and she feels about 50% back to normal.  She remains weak and needs assist for all OOB mobility to be safe.  As long as family agrees they can handle her at discharge (would be helpful for them to see her move) she can go home with HHPT, if they say they cannot handle her at d/c she may need SNF for rehab.  Currently HHPT is her preference.  PT will continue to follow acutely for safe mobility progression.      Recommendations for follow up therapy are one component of a multi-disciplinary discharge planning process, led by the attending physician.  Recommendations may be updated based on patient status, additional functional criteria and insurance authorization.  Follow Up Recommendations Home health PT Can patient physically be transported by private vehicle: Yes    Assistance Recommended at Discharge Frequent or constant Supervision/Assistance  Patient can return home with the following  A little help with walking and/or transfers;A little help with bathing/dressing/bathroom;Assistance with cooking/housework;Direct supervision/assist for medications management;Assist for transportation;Help with stairs or ramp for entrance    Equipment Recommendations None recommended by PT  Recommendations for Other Services        Functional Status Assessment Patient has had a recent decline in their functional status and demonstrates the ability to make significant improvements in function in a reasonable and predictable amount of time.     Precautions / Restrictions Precautions Precautions: Fall Precaution Comments: watch BP      Mobility  Bed Mobility Overal bed mobility: Needs Assistance Bed Mobility: Supine to Sit     Supine to sit: Min assist     General bed mobility comments: Min hand held assist to help pull to sitting EOB.  HOB elevated and pt using railing.  Extra time needed.    Transfers Overall transfer level: Needs assistance Equipment used: Rolling walker (2 wheels) Transfers: Sit to/from Stand, Bed to chair/wheelchair/BSC Sit to Stand: Min assist   Step pivot transfers: Min assist       General transfer comment: min assist to stand and pivot to [facility] and take steps from [facility] to recliner chair.    Ambulation/Gait Ambulation/Gait assistance: Min assist Gait Distance (Feet): 10 Feet Assistive device: Rolling walker (2 wheels) Gait Pattern/deviations: Step-through pattern, Knees buckling       General Gait Details: pt ambulating over "bouncy" knees.  Min assist to support trunk, cues to stay closer to RW.  Stairs            Wheelchair Mobility    Modified Rankin (Stroke Patients Only)       Balance Overall balance assessment: Needs assistance Sitting-balance support: Feet supported, Bilateral upper extremity supported Sitting balance-Leahy Scale: Fair Sitting balance - Comments: close supervision EOB.   Standing balance support: Bilateral upper extremity supported, During functional activity Standing balance-Leahy Scale: Fair Standing balance comment:  reliant on RW for support, able to let go with one hand to perform peri care after [facility].                             Pertinent Vitals/Pain Pain Assessment Pain Assessment: Faces Faces Pain Scale: Hurts  little more Pain Location: R lower quadrant Pain Descriptors / Indicators: Grimacing Pain Intervention(s): Limited activity within patient's tolerance, Monitored during session, Repositioned    Home Living Family/patient expects to be discharged to:: Private residence Living Arrangements: Spouse/significant other;Children Available Help at Discharge: Available 24 hours/day Type of Home: House Home Access: Stairs to enter Entrance Stairs-Rails: None Entrance Stairs-Number of Steps: 20 Alternate Level Stairs-Number of Steps: 12 Home Layout: Able to live on main level with bedroom/bathroom;Multi-level Home Equipment: Cane - single point;Rolling Walker (2 wheels);Shower seat - built in Additional Comments: set up for daughters home where she may discharge    Prior Function Prior Level of Function : Driving;Independent/Modified Independent             Mobility Comments: no AD at home, uses shopping cart for support at grocery store, driving ADLs Comments: independent in ADLs and iADLs.     Hand Dominance   Dominant Hand: Right    Extremity/Trunk Assessment   Upper Extremity Assessment Upper Extremity Assessment: Overall WFL for tasks assessed    Lower Extremity Assessment Lower Extremity Assessment: Generalized weakness    Cervical / Trunk Assessment Cervical / Trunk Assessment: Normal  Communication   Communication: No difficulties  Cognition Arousal/Alertness: Awake/alert Behavior During Therapy: WFL for tasks assessed/performed Overall Cognitive Status: Within Functional Limits for tasks assessed                                          General Comments      Exercises General Exercises - Upper Extremity Shoulder Flexion: AROM, Both, 10 reps Elbow Flexion: AROM, Both, 10 reps General Exercises - Lower Extremity Ankle Circles/Pumps: AROM, Both, 10 reps Long Arc Quad: AROM, Both, 10 reps Hip Flexion/Marching: AROM, Both, 10 reps Toe Raises:  AROM, Both, 10 reps Heel Raises: AROM, Both, 10 reps   Assessment/Plan    PT Assessment Patient needs continued PT services  PT Problem List Decreased strength;Decreased activity tolerance;Decreased balance;Decreased mobility;Cardiopulmonary status limiting activity;Decreased cognition       PT Treatment Interventions DME instruction;Gait training;Stair training;Functional mobility training;Therapeutic activities;Therapeutic exercise;Balance training;Neuromuscular re-education;Patient/family education    PT Goals (Current goals can be found in the Care Plan section)  Acute Rehab PT Goals Patient Stated Goal: to go home and have therapy at home PT Goal Formulation: With patient Time For Goal Achievement: 06/19/22 Potential to Achieve Goals: Good    Frequency Min 3X/week     Co-evaluation               AM-PAC PT "6 Clicks" Mobility  Outcome Measure Help needed turning from your back to your side while in a flat bed without using bedrails?: A Little Help needed moving from lying on your back to sitting on the side of a flat bed without using bedrails?: A Little Help needed moving to and from a bed to a chair (including a wheelchair)?: A Little Help needed standing up from a chair using your arms (e.g., wheelchair or bedside chair)?: A Little Help needed to walk in hospital room?: Total Help needed  climbing 3-5 steps with a railing? : Total 6 Click Score: 14    End of Session Equipment Utilized During Treatment: Gait belt Activity Tolerance: Patient limited by fatigue;Patient limited by pain Patient left: in chair;with call bell/phone within reach;with chair alarm set   PT Visit Diagnosis: Muscle weakness (generalized) (M62.81);Difficulty in walking, not elsewhere classified (R26.2);Pain Pain - Right/Left:  (abdomen) Pain - part of body:  (abdomen)    Time: 1423-1450 PT Time Calculation (min) (ACUTE ONLY): 27 min   Charges:   PT Evaluation $PT Re-evaluation: 1  Re-eval PT Treatments $Therapeutic Activity: 8-22 mins        Verdene Lennert, PT, DPT  Acute Rehabilitation Secure chat is best for contact #[phone] office

## 2022-06-05 NOTE — Progress Notes (Signed)
Rounding Note    Patient Name: Natasha Dickson Date of Encounter: 06/05/2022  [place] Cardiologist: Freada Bergeron, MD   Subjective   Adequate rate control in afib s/p laparoscopic, hand assisted, right colectomy 06/04/22 Dr. Thermon Leyland. Abd pain but pleasant and conversant.  Inpatient Medications    Scheduled Meds:  acetaminophen  650 mg Oral Q6H   alteplase  2 mg Intracatheter Once   alteplase  2 mg Intracatheter Once   alvimopan  12 mg Oral BID   amiodarone  200 mg Oral BID   atorvastatin  10 mg Oral Daily   Chlorhexidine Gluconate Cloth  6 each Topical Daily   [START ON 06/09/2022] cyanocobalamin  1,000 mcg Intramuscular Q Sun   docusate sodium  100 mg Oral BID   enoxaparin (LOVENOX) injection  90 mg Subcutaneous Q12H   feeding supplement  237 mL Oral TID BM   folic acid  1 mg Oral Daily   furosemide  20 mg Oral Daily   gabapentin  300 mg Oral TID   mupirocin ointment  1 Application Nasal BID   sodium chloride flush  10-40 mL Intracatheter Q12H   Continuous Infusions:  methocarbamol (ROBAXIN) IV      PRN Meds:    Vital Signs    Vitals:   06/04/22 2316 06/05/22 0314 06/05/22 0341 06/05/22 0852  BP: (!) 134/59 (!) 140/51  116/74  Pulse: 73 80  71  Resp: '18 20  16  '$ Temp: 98.5 F (36.9 C) 98.6 F (37 C)  98.3 F (36.8 C)  TempSrc: Oral Oral  Oral  SpO2: 96% 95%  97%  Weight:   94.3 kg   Height:        Intake/Output Summary (Last 24 hours) at 06/05/2022 0918 Last data filed at 06/05/2022 0300 Gross per 24 hour  Intake 1090 ml  Output 900 ml  Net 190 ml      06/05/2022    3:41 AM 06/04/2022    4:13 AM 05/27/2022   11:21 AM  Last 3 Weights  Weight (lbs) 207 lb 14.3 oz 207 lb 0.2 oz 205 lb 0.4 oz  Weight (kg) 94.3 kg 93.9 kg 93 kg      Telemetry    Afib rate 70s some pvcs - Personally Reviewed  ECG    No new tracing- Personally Reviewed  Physical Exam   GEN: No acute distress.  Laying in bed comfortable Neck: No  JVD, tunneled CVC R side Cardiac: Irregularly irregular, no murmurs Respiratory: Clear to auscultation bilaterally. GI: Soft, nontender, non-distended  MS: 1+ puffy UE edema, warm, trace LE edema Neuro:  Nonfocal  Psych: Normal affect   Labs    High Sensitivity Troponin:   Recent Labs  Lab 05/27/22 1143 05/27/22 1809  TROPONINIHS 5 5     Chemistry Recent Labs  Lab 06/01/22 0015 06/02/22 0626 06/03/22 0452 06/04/22 0232 06/05/22 0357  NA 139   < > 136 136 135  K 3.4*   < > 4.0 4.4 4.2  CL 113*   < > 109 107 106  CO2 21*   < > '22 22 23  '$ GLUCOSE 99   < > 93 83 110*  BUN 8   < > 6* 8 9  CREATININE 1.00   < > 0.90 0.89 0.91  CALCIUM 8.1*   < > 7.9* 8.0* 7.9*  MG 2.1  --   --   --   --   GFRNONAA 58*   < > >  60 >60 >60  ANIONGAP 5   < > '5 7 6   '$ < > = values in this interval not displayed.    Lipids  No results for input(s): "CHOL", "TRIG", "HDL", "LABVLDL", "LDLCALC", "CHOLHDL" in the last 168 hours.  Hematology Recent Labs  Lab 06/03/22 0452 06/04/22 0232 06/05/22 0357  WBC 9.6 8.1 11.4*  RBC 3.36* 3.62* 3.22*  HGB 8.4* 9.0* 8.1*  HCT 27.5* 30.1* 26.1*  MCV 81.8 83.1 81.1  MCH 25.0* 24.9* 25.2*  MCHC 30.5 29.9* 31.0  RDW 20.1* 21.2* 20.7*  PLT 222 222 221   Thyroid  Recent Labs  Lab 05/31/22 0146  TSH 5.363*    BNP Recent Labs  Lab 06/01/22 1030  BNP 313.1*    DDimer No results for input(s): "DDIMER" in the last 168 hours.   Radiology    DG CHEST PORT 1 VIEW  Result Date: 06/04/2022 CLINICAL DATA:  Cough EXAM: PORTABLE CHEST 1 VIEW COMPARISON:  05/27/2022 FINDINGS: Overall inspiratory effort is poor. Heart is mildly enlarged but stable. Aortic calcifications are again seen. Right jugular central line is noted with the tip in the mid right atrium. No pneumothorax is seen. No focal infiltrate or effusion is noted. IMPRESSION: Poor inspiratory effort when compare with the prior exam although no focal infiltrate is seen. No pneumothorax following  central line placement is noted. Electronically Signed   By: Inez Catalina M.D.   On: 06/04/2022 19:02    Cardiac Studies   TTE 04/16/22: IMPRESSIONS     1. Left ventricular ejection fraction, by estimation, is 55 to 60%. The  left ventricle has normal function. The left ventricle has no regional  wall motion abnormalities. Left ventricular diastolic parameters are  indeterminate.   2. Right ventricular systolic function is normal. The right ventricular  size is normal. There is normal pulmonary artery systolic pressure.   3. Left atrial size was mildly dilated.   4. The mitral valve is normal in structure. Mild mitral valve  regurgitation. No evidence of mitral stenosis.   5. Tricuspid valve regurgitation is mild to moderate.   6. The aortic valve is tricuspid. Aortic valve regurgitation is not  visualized. Aortic valve sclerosis is present, with no evidence of aortic  valve stenosis.   7. The inferior vena cava is normal in size with greater than 50%  respiratory variability, suggesting right atrial pressure of 3 mmHg.   Patient Profile     76 y.o. female  with a hx of combined systolic and diastolic HF thought to be tachycardia induced CM in the setting of Aib with RVR, persistent Afib on rate control strategy, HTN, HLD and CKD who presented to the ER with progressive weakness found to be anemic and in Afib with RVR for which Cardiology was consulted. Underwent EGD/Colo on 05/29/22 which revealed obstructing colonic mass.   Assessment & Plan    Obstructing Mass at the Cecum - s/p laparoscopic, hand assisted, right colectomy 06/04/22, surgery following.   Persistent Afib Secondary hypercoagulable state  CHADs-vasc 5. Patient has known chronic Afib for which she has been maintained on rate control strategy. Presented on this admission with increasing weakness and SOB found to be in Afib with RVR with HR 110-130s. BP soft on admission and could not tolerate nodal agents and  therefore amiodarone was started. She was also found to be anemic (9.7) with positive stool guaiac now found to have obstructing mass on colonoscopy. Xarelto was held and heparin was started.   -  amiodarone 200 mg BID for total of 2 weeks then to 200 mg daily - resume AC when Hgb stable and when safe from post surgical standpoint. - rates improved with dig loading 12/25, needs dig level today will order. Digoxin as needed for rate control, not maintenance.   Microcytic Anemia Low B12  HgB 9 on admission and was previously 13 in 2021. Fecal occult positive and found to have obstructing colonic mass on colonoscopy as above. HGB down to 7.9.  Management of colonic mass per surgery consider transfusion if Hgb <8.  Generalized weakness/fatigue: Likely related to anemia and underlying malignancy as above.   Chronic Combined Systolic and Diastolic HF with Improved EF: EF dropped to 30-35% 2014 thought to be secondary to tachy induced CM in the setting of Afib with RVR. Improved back to 55-60% in 04/2022. Holding GDMT due to soft blood pressures.  Low dose metoprolol added by overnight fellow, can continue as tolerated prn, HR >120 Will add back GDMT as able pending blood pressures Will give lasix 20 mg IV and monitor response, received IVF earlier this admission. 5.8 L positive for admission.   HTN  Holding home meds in the setting of hypotension   CKD Stage II:  - Creatinine stable    HLD  - Continue lipitor '10mg'$  daily  For questions or updates, please contact [place] Please consult [email or web address] for contact info under

## 2022-06-05 NOTE — Plan of Care (Signed)
  Problem: Education: Goal: Knowledge of General Education information will improve Description: Including pain rating scale, medication(s)/side effects and non-pharmacologic comfort measures Outcome: Progressing   Problem: Health Behavior/Discharge Planning: Goal: Ability to manage health-related needs will improve Outcome: Progressing   Problem: Clinical Measurements: Goal: Respiratory complications will improve Outcome: Progressing   Problem: Clinical Measurements: Goal: Cardiovascular complication will be avoided Outcome: Progressing   Problem: Nutrition: Goal: Adequate nutrition will be maintained Outcome: Progressing   Problem: Pain Managment: Goal: General experience of comfort will improve Outcome: Progressing   Problem: Safety: Goal: Ability to remain free from injury will improve Outcome: Progressing   Problem: Skin Integrity: Goal: Risk for impaired skin integrity will decrease Outcome: Progressing

## 2022-06-05 NOTE — Progress Notes (Signed)
Natasha Dickson for lovenox Indication: DVT  No Known Allergies  Patient Measurements: Height: '5\' 1"'$  (154.9 cm) Weight: 94.3 kg (207 lb 14.3 oz) IBW/kg (Calculated) : 47.8 Heparin Dosing Weight: 69.7 kg  Vital Signs: Temp: 98.6 F (37 C) (12/27 0314) Temp Source: Oral (12/27 0314) BP: 140/51 (12/27 0314) Pulse Rate: 80 (12/27 0314)  Labs: Recent Labs    06/02/22 1950 06/03/22 0452 06/04/22 0232 06/05/22 0357  HGB 8.7* 8.4* 9.0*  --   HCT 28.8* 27.5* 30.1*  --   PLT  --  222 222  --   CREATININE  --  0.90 0.89 0.91     Estimated Creatinine Clearance: 55.1 mL/min (by C-G formula based on SCr of 0.91 mg/dL).   Medical History: Past Medical History:  Diagnosis Date   Breast cancer ([place]) 1999   Stage I in left breast   Chronic combined systolic and diastolic CHF (congestive heart failure) ([place])    a. 02/2013 Echo: EF 30-35%, diff HK, biatrial enlargement. b. 2D echo 07/2014 showed mildly dilated LV, EF 45-50%, severely dilated left atrium and moderately dilated right atrium.   CKD (chronic kidney disease), stage II    Closed fracture of unspecified part of neck of femur 2011   Right.  Soreness when standing. (Dr. Durward Fortes)   Nonischemic cardiomyopathy [facility])    a. 02/2013 [facility] CL: No ischemia, prob attenuation vs scar, EF 40%-->Med Rx.   Persistent atrial fibrillation ([place])    a. 02/2013: s/p TEE and attempted cardioversion x 2-->failed-->rate controlled with bb/digoxin, Xarelto initiated.   Assessment: 76 year old female presenting with afib w/ RVR - on Xarelto PTA. While admitted was started on heparin infusion. Heparin held in s/o progressive anemia. After PICC line placement, patient developed DVT, pharmacy has been consulted to dose enoxaparin.   Pt s/p laparoscopic colectomy 12/26. Enoxaparin to resume this morning. Wt 94kg, will increase enoxaparin dose to '90mg'$ .  Goal of Therapy:  Monitor platelets by anticoagulation protocol:  Yes   Plan:  Enoxaparin '90mg'$  SQ BID Check q72h CBC  Arrie Senate, PharmD, [place], [facility] Clinical Pharmacist [phone] Please check AMION for all [place] numbers 06/05/2022

## 2022-06-06 DIAGNOSIS — I4891 Unspecified atrial fibrillation: Secondary | ICD-10-CM | POA: Diagnosis not present

## 2022-06-06 LAB — CBC
HCT: 28.5 % — ABNORMAL LOW (ref 36.0–46.0)
Hemoglobin: 8.5 g/dL — ABNORMAL LOW (ref 12.0–15.0)
MCH: 24.6 pg — ABNORMAL LOW (ref 26.0–34.0)
MCHC: 29.8 g/dL — ABNORMAL LOW (ref 30.0–36.0)
MCV: 82.4 fL (ref 80.0–100.0)
Platelets: 245 10*3/uL (ref 150–400)
RBC: 3.46 MIL/uL — ABNORMAL LOW (ref 3.87–5.11)
RDW: 21.3 % — ABNORMAL HIGH (ref 11.5–15.5)
WBC: 10.7 10*3/uL — ABNORMAL HIGH (ref 4.0–10.5)
nRBC: 0 % (ref 0.0–0.2)

## 2022-06-06 LAB — BASIC METABOLIC PANEL
Anion gap: 7 (ref 5–15)
BUN: 11 mg/dL (ref 8–23)
CO2: 26 mmol/L (ref 22–32)
Calcium: 7.9 mg/dL — ABNORMAL LOW (ref 8.9–10.3)
Chloride: 103 mmol/L (ref 98–111)
Creatinine, Ser: 0.95 mg/dL (ref 0.44–1.00)
GFR, Estimated: >60 mL/min (ref >60)
Glucose, Bld: 94 mg/dL (ref 70–99)
Potassium: 3.5 mmol/L (ref 3.5–5.1)
Sodium: 136 mmol/L (ref 135–145)

## 2022-06-06 LAB — SURGICAL PATHOLOGY

## 2022-06-06 MED ORDER — DIGOXIN 0.25 MG/ML IJ SOLN
0.2500 mg | Freq: Three times a day (TID) | INTRAMUSCULAR | Status: AC
  Administered 2022-06-06 – 2022-06-07 (×3): 0.25 mg via INTRAVENOUS
  Filled 2022-06-06 (×3): qty 1

## 2022-06-06 MED ORDER — PANTOPRAZOLE SODIUM 40 MG IV SOLR
40.0000 mg | Freq: Every day | INTRAVENOUS | Status: DC
  Administered 2022-06-06 – 2022-06-08 (×3): 40 mg via INTRAVENOUS
  Filled 2022-06-06 (×3): qty 10

## 2022-06-06 MED ORDER — IPRATROPIUM-ALBUTEROL 0.5-2.5 (3) MG/3ML IN SOLN: 3.0000 mL | RESPIRATORY_TRACT | Status: AC

## 2022-06-06 MED ORDER — FUROSEMIDE 20 MG PO TABS: 20.0000 mg | ORAL_TABLET | Freq: Every day | ORAL | Status: DC

## 2022-06-06 MED ORDER — METOPROLOL SUCCINATE ER 25 MG PO TB24
12.5000 mg | ORAL_TABLET | Freq: Every day | ORAL | Status: DC
  Administered 2022-06-06: 12.5 mg via ORAL
  Filled 2022-06-06: qty 1

## 2022-06-06 MED ORDER — FUROSEMIDE 10 MG/ML IJ SOLN
20.0000 mg | Freq: Once | INTRAMUSCULAR | Status: AC
  Administered 2022-06-06: 20 mg via INTRAVENOUS
  Filled 2022-06-06: qty 2

## 2022-06-06 MED ORDER — ORAL CARE MOUTH RINSE: 15.0000 mL | OROMUCOSAL | Status: DC | PRN

## 2022-06-06 NOTE — Progress Notes (Signed)
Occupational Therapy Treatment Patient Details Name: Natasha Dickson MRN: [id number] DOB: Aug 27, 1945 Today's Date: 06/06/2022   History of present illness 76 y.o. female who presented to the ED 12/18 with hypotension and weakness. 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  Pt s/p laporoscopic R colectomy on 06/04/22.  PMH: A. Fib with RVR on Xarelto (last dose 12/18, currently on heparin gtt), CHF (30-35% 2014, now 55-60% in 04/2022), HTN, HLD, and CKD2.   OT comments  Patient demonstrated progress this treatment session with patient able to stand with min assist and ambulate to sink to perform grooming and UB bathing. Patient stated she wanted to attempt tasks standing but HR increased to 160s and therapist recommended performing seated. Patient performed transfers to recliner and patient stated she wanted to return to bed due to fatigue at end of session. Discussed with patient potential for AIR instead of home first to increase activity tolerance and improvement towards more mobility, self care and transfers for safer discharge home and decrease burden of care. Patient continues to state she would rather go home with assistance from family and get home health therapy. Acute OT to continue to follow.    Recommendations for follow up therapy are one component of a multi-disciplinary discharge planning process, led by the attending physician.  Recommendations may be updated based on patient status, additional functional criteria and insurance authorization.    Follow Up Recommendations  Home health OT     Assistance Recommended at Discharge Intermittent Supervision/Assistance  Patient can return home with the following  A little help with walking and/or transfers;A little help with bathing/dressing/bathroom;Assistance with cooking/housework;Assist for transportation;Help with stairs or ramp for entrance   Equipment Recommendations  BSC/3in1     Recommendations for Other Services      Precautions / Restrictions Precautions Precautions: Fall Precaution Comments: watch BP Restrictions Weight Bearing Restrictions: No       Mobility Bed Mobility Overal bed mobility: Needs Assistance Bed Mobility: Supine to Sit, Sit to Supine     Supine to sit: Min assist Sit to supine: Min assist   General bed mobility comments: assistance to pull up trunk into sitting and assistance with BLEs to return to supine    Transfers Overall transfer level: Needs assistance Equipment used: Rolling walker (2 wheels) Transfers: Sit to/from Stand, Bed to chair/wheelchair/BSC Sit to Stand: Min assist     Step pivot transfers: Min assist     General transfer comment: min assist to rise and to pivot into recliner     Balance Overall balance assessment: Needs assistance Sitting-balance support: Feet supported, Bilateral upper extremity supported Sitting balance-Leahy Scale: Fair     Standing balance support: Bilateral upper extremity supported, During functional activity, Single extremity supported Standing balance-Leahy Scale: Fair Standing balance comment: stood at sink for grooming standing at sink with one extremity support but limited standing due to increased HR                           ADL either performed or assessed with clinical judgement   ADL Overall ADL's : Needs assistance/impaired     Grooming: Wash/dry hands;Wash/dry face;Oral care;Set up;Sitting Grooming Details (indicate cue type and reason): patient to attempt standing but HR was in 160s Upper Body Bathing: Set up;Sitting  General ADL Comments: required frequent rest breaks due to increased HR    Extremity/Trunk Assessment              Vision       Perception     Praxis      Cognition Arousal/Alertness: Awake/alert Behavior During Therapy: WFL for tasks assessed/performed Overall Cognitive Status:  Within Functional Limits for tasks assessed                                          Exercises      Shoulder Instructions       General Comments HR 140-160s during mobility.  At end of session in supine HR 113, SpO2 98    Pertinent Vitals/ Pain       Pain Assessment Pain Assessment: Faces Faces Pain Scale: Hurts little more Pain Location: R lower quadrant Pain Descriptors / Indicators: Grimacing Pain Intervention(s): Monitored during session, Repositioned, Limited activity within patient's tolerance  Home Living                                          Prior Functioning/Environment              Frequency  Min 2X/week        Progress Toward Goals  OT Goals(current goals can now be found in the care plan section)  Progress towards OT goals: Progressing toward goals  Acute Rehab OT Goals Patient Stated Goal: go home OT Goal Formulation: With patient Time For Goal Achievement: 06/13/22 Potential to Achieve Goals: Good ADL Goals Pt Will Perform Grooming: with modified independence;standing Pt Will Perform Lower Body Dressing: with modified independence;sit to/from stand Pt Will Transfer to Toilet: with modified independence;ambulating Additional ADL Goal #1: Pt will complete at least 8 minutes of OOB functional tasks to demonstrate increased activity tolerance Additional ADL Goal #2: Pt will indep complete IADL medication management task  Plan Discharge plan remains appropriate    Co-evaluation                 AM-PAC OT "6 Clicks" Daily Activity     Outcome Measure   Help from another person eating meals?: None Help from another person taking care of personal grooming?: A Little Help from another person toileting, which includes using toliet, bedpan, or urinal?: A Little Help from another person bathing (including washing, rinsing, drying)?: A Little Help from another person to put on and taking off regular upper  body clothing?: None Help from another person to put on and taking off regular lower body clothing?: A Lot 6 Click Score: 19    End of Session Equipment Utilized During Treatment: Rolling walker (2 wheels)  OT Visit Diagnosis: Unsteadiness on feet (R26.81);Other abnormalities of gait and mobility (R26.89);Muscle weakness (generalized) (M62.81);Pain   Activity Tolerance Patient tolerated treatment well   Patient Left in bed;with call bell/phone within reach;with family/visitor present   Nurse Communication Mobility status        Time: 8657-8469 OT Time Calculation (min): 41 min  Charges: OT General Charges $OT Visit: 1 Visit OT Treatments $Self Care/Home Management : 23-37 mins $Therapeutic Activity: 8-22 mins  Lodema Hong, OTA Acute Rehabilitation Services  Office [phone]   Trixie Dredge 06/06/2022, 2:53 PM

## 2022-06-06 NOTE — Progress Notes (Signed)
Patient HR jumping up to 140s with coughing and patient coughing frequently.  Denies CP/SOB.  Cardiology notified order for IV digoxin placed.  Will continue to monitor HR.

## 2022-06-06 NOTE — TOC Progression Note (Addendum)
Transition of Care [facility]) - Progression Note    Patient Details  Name: Natasha Dickson MRN: [id number] Date of Birth: Apr 02, 1946  [facility]) CM/SW Contact  Graves-Bigelow, Ocie Cornfield, RN Phone Number: 06/06/2022, 4:13 PM  Clinical Narrative: Patient presented for hypotension and weakness. PTA patient was from home with spouse. PT/OT recommendations are for [facility] with DME BSC. Case Manager spoke with daughter Margaretha Sheffield and she wanted her father to make the decision regarding [place] and DME company. Case Manager called spouse twice and no answer-left voicemail. Awaiting callback. Medicare.gov list is in the room for the family to review agency of choice. Margaretha Sheffield states the spouse is cleaning and making room for the patient to return home. Per daughter, patient does not have any DME in the home. Daughter asked about a toilet riser- Case Manager did make her aware that the item can be purchased at a medical supply store like [place] or [place]. Case Manager will continue to follow for transition of care needs as the patient progresses.  1635 06-06-22 Case Manager received a call back from spouse. Case Manager explained how [place] works and how often they visit. Patient will not have a co pay for services. Spouse is undecided at this time regarding agency choice. Medicare.gov list placed in the room. Spouse states patient will not be ready for d/c until the middle of next week. Case Manager will continue to follow for transition of care needs as the patient progresses.       Expected Discharge Plan: [place] Barriers to Discharge: Continued Medical Work up  Expected Discharge Plan and Services In-house Referral: NA Discharge Planning Services: CM Consult Post Acute Care Choice: [place] arrangements for the past 2 months: Single Family Home                   DME Agency: NA       HH Arranged: PT, OT   Social Determinants of Health (SDOH)  Interventions SDOH Screenings   Food Insecurity: No Food Insecurity (10/24/2021)  Housing: Low Risk  (10/24/2021)  Transportation Needs: No Transportation Needs (10/24/2021)  Alcohol Screen: Low Risk  (10/24/2021)  Depression (PHQ2-9): Low Risk  (10/24/2021)  Financial Resource Strain: Low Risk  (10/24/2021)  Physical Activity: Inactive (10/24/2021)  Social Connections: Moderately Isolated (10/24/2021)  Stress: No Stress Concern Present (10/24/2021)  Tobacco Use: Low Risk  (06/05/2022)    Readmission Risk Interventions     No data to display

## 2022-06-06 NOTE — Progress Notes (Signed)
   06/06/22 1343  Assess: MEWS Score  Temp 97.8 F (36.6 C)  BP (!) 114/43  MAP (mmHg) (!) 63  Pulse Rate (!) 141  ECG Heart Rate (!) 141  Resp 20  SpO2 98 %  O2 Device Room Air  Assess: MEWS Score  MEWS Temp 0  MEWS Systolic 0  MEWS Pulse 3  MEWS RR 0  MEWS LOC 0  MEWS Score 3  MEWS Score Color Yellow  Assess: if the MEWS score is Yellow or Red  Were vital signs taken at a resting state? Yes  Focused Assessment No change from prior assessment  Does the patient meet 2 or more of the SIRS criteria? No  Does the patient have a confirmed or suspected source of infection? No  Provider and Rapid Response Notified? No  MEWS guidelines implemented *See Row Information* Yes  Treat  Pain Scale 0-10  Pain Score 0  Take Vital Signs  Increase Vital Sign Frequency  Yellow: Q 2hr X 2 then Q 4hr X 2, if remains yellow, continue Q 4hrs  Escalate  MEWS: Escalate Yellow: discuss with charge nurse/RN and consider discussing with provider and RRT  Notify: Charge Nurse/RN  Name of Charge Nurse/RN Notified Yoko RN  Date Charge Nurse/RN Notified 06/06/22  Time Charge Nurse/RN Notified 1400  Provider Notification  Provider Name/Title PA Williams  Date Provider Notified 06/06/22  Time Provider Notified 1430  Method of Notification Page  Notification Reason Other (Comment) (increased HR)  Provider response See new orders  Date of Provider Response 06/06/22  Time of Provider Response 1435  Document  Patient Outcome Other (Comment) (remains stable)  Progress note created (see row info) Yes  Assess: SIRS CRITERIA  SIRS Temperature  0  SIRS Pulse 1  SIRS Respirations  0  SIRS WBC 0  SIRS Score Sum  1

## 2022-06-06 NOTE — Progress Notes (Signed)
Patient ID: Natasha Dickson, female   DOB: 1945-09-21, 76 y.o.   MRN: [id number] [facility] Progress Note:   2 Days Post-Op   THE PLAN  Go slow on liquids-postop ileus   Subjective: Mental status is slower today.  .  Complaints cough. Objective: Vital signs in last 24 hours: Temp:  [97.4 F (36.3 C)-98 F (36.7 C)] 97.8 F (36.6 C) (12/28 1343) Pulse Rate:  [77-141] 141 (12/28 1343) Resp:  [11-20] 20 (12/28 1343) BP: (107-146)/(43-72) 114/43 (12/28 1343) SpO2:  [96 %-100 %] 98 % (12/28 1343) Weight:  [95.7 kg] 95.7 kg (12/28 0326)  Intake/Output from previous day: 12/27 0701 - 12/28 0700 In: 120 [P.O.:120] Out: 2201 [Urine:2200; Stool:1] Intake/Output this shift: No intake/output data recorded.  Physical Exam: Work of breathing is not labored but forceful cough.  AF with rapid ventricular response.  Incision OK--having BM and flatus  Lab Results:  Results for orders placed or performed during the hospital encounter of 05/27/22 (from the past 48 hour(s))  Basic metabolic panel     Status: Abnormal   Collection Time: 06/05/22  3:57 AM  Result Value Ref Range   Sodium 135 135 - 145 mmol/L   Potassium 4.2 3.5 - 5.1 mmol/L   Chloride 106 98 - 111 mmol/L   CO2 23 22 - 32 mmol/L   Glucose, Bld 110 (H) 70 - 99 mg/dL    Comment: Glucose reference range applies only to samples taken after fasting for at least 8 hours.   BUN 9 8 - 23 mg/dL   Creatinine, Ser 0.91 0.44 - 1.00 mg/dL   Calcium 7.9 (L) 8.9 - 10.3 mg/dL   GFR, Estimated >60 >60 mL/min    Comment: (NOTE) Calculated using the CKD-EPI Creatinine Equation (2021)    Anion gap 6 5 - 15    Comment: Performed at [facility] [address], [place], [place] [address]  CBC     Status: Abnormal   Collection Time: 06/05/22  3:57 AM  Result Value Ref Range   WBC 11.4 (H) 4.0 - 10.5 K/uL   RBC 3.22 (L) 3.87 - 5.11 MIL/uL   Hemoglobin 8.1 (L) 12.0 - 15.0 g/dL   HCT 26.1 (L) 36.0 - 46.0 %   MCV 81.1 80.0 -  100.0 fL   MCH 25.2 (L) 26.0 - 34.0 pg   MCHC 31.0 30.0 - 36.0 g/dL   RDW 20.7 (H) 11.5 - 15.5 %   Platelets 221 150 - 400 K/uL   nRBC 0.3 (H) 0.0 - 0.2 %    Comment: Performed at [facility], [place] [address], [place], [place] [address]  Digoxin level     Status: None   Collection Time: 06/05/22 11:39 AM  Result Value Ref Range   Digoxin Level 1.1 0.8 - 2.0 ng/mL    Comment: Performed at [facility] [address] [place], [place] [address]  Hemoglobin and hematocrit, blood     Status: Abnormal   Collection Time: 06/05/22  8:30 PM  Result Value Ref Range   Hemoglobin 8.4 (L) 12.0 - 15.0 g/dL   HCT 26.8 (L) 36.0 - 46.0 %    Comment: Performed at [facility] [address], [place] [address]  CBC     Status: Abnormal   Collection Time: 06/06/22  4:43 AM  Result Value Ref Range   WBC 10.7 (H) 4.0 - 10.5 K/uL   RBC 3.46 (L) 3.87 - 5.11  MIL/uL   Hemoglobin 8.5 (L) 12.0 - 15.0 g/dL   HCT 28.5 (L) 36.0 - 46.0 %   MCV 82.4 80.0 - 100.0 fL   MCH 24.6 (L) 26.0 - 34.0 pg   MCHC 29.8 (L) 30.0 - 36.0 g/dL   RDW 21.3 (H) 11.5 - 15.5 %   Platelets 245 150 - 400 K/uL   nRBC 0.0 0.0 - 0.2 %    Comment: Performed at [facility] [address] [place], [place] [address]  Basic metabolic panel     Status: Abnormal   Collection Time: 06/06/22  4:43 AM  Result Value Ref Range   Sodium 136 135 - 145 mmol/L   Potassium 3.5 3.5 - 5.1 mmol/L   Chloride 103 98 - 111 mmol/L   CO2 26 22 - 32 mmol/L   Glucose, Bld 94 70 - 99 mg/dL    Comment: Glucose reference range applies only to samples taken after fasting for at least 8 hours.   BUN 11 8 - 23 mg/dL   Creatinine, Ser 0.95 0.44 - 1.00 mg/dL   Calcium 7.9 (L) 8.9 - 10.3 mg/dL   GFR, Estimated >60 >60 mL/min    Comment: (NOTE) Calculated using the CKD-EPI Creatinine Equation (2021)    Anion gap 7 5 - 15    Comment: Performed at [facility] [address] [place], [place] [address]     Radiology/Results: DG CHEST PORT 1 VIEW  Result Date: 06/04/2022 CLINICAL DATA:  Cough EXAM: PORTABLE CHEST 1 VIEW COMPARISON:  05/27/2022 FINDINGS: Overall inspiratory effort is poor. Heart is mildly enlarged but stable. Aortic calcifications are again seen. Right jugular central line is noted with the tip in the mid right atrium. No pneumothorax is seen. No focal infiltrate or effusion is noted. IMPRESSION: Poor inspiratory effort when compare with the prior exam although no focal infiltrate is seen. No pneumothorax following central line placement is noted. Electronically Signed   By: Inez Catalina M.D.   On: 06/04/2022 19:02    Anti-infectives: Anti-infectives (From admission, onward)    Start     Dose/Rate Route Frequency Ordered Stop   06/04/22 0600  cefoTEtan (CEFOTAN) 2 g in sodium chloride 0.9 % 100 mL IVPB  Status:  Discontinued        2 g 200 mL/hr over 30 Minutes Intravenous On call to O.R. 06/03/22 3151 06/04/22 0956   06/03/22 1400  neomycin (MYCIFRADIN) tablet 1,000 mg       See Hyperspace for full Linked Orders Report.   1,000 mg Oral 3 times per day 06/03/22 0841 06/03/22 2159   06/03/22 1400  metroNIDAZOLE (FLAGYL) tablet 1,000 mg       See Hyperspace for full Linked Orders Report.   1,000 mg Oral 3 times per day 06/03/22 0841 06/03/22 2116   05/31/22 0600  cefoTEtan (CEFOTAN) 2 g in sodium chloride 0.9 % 100 mL IVPB        2 g 200 mL/hr over 30 Minutes Intravenous On call to O.R. 05/30/22 1716 06/01/22 0559   05/30/22 2200  neomycin (MYCIFRADIN) tablet 1,000 mg       See Hyperspace for full Linked Orders Report.   1,000 mg Oral 3 times per day 05/30/22 1716 05/30/22 2238   05/30/22 2200  metroNIDAZOLE (FLAGYL) tablet 1,000 mg       See Hyperspace for full Linked Orders Report.   1,000 mg Oral 3 times per day 05/30/22 1716 05/30/22 2128  Assessment/Plan: Problem List: Patient Active Problem List   Diagnosis Date Noted   Colonic mass 05/29/2022   Iron  deficiency anemia due to chronic blood loss 05/29/2022   Diverticulosis of colon without hemorrhage 05/29/2022   Adenomatous polyp of transverse colon 05/29/2022   Adenomatous polyp of descending colon 05/29/2022   Adenomatous polyp of sigmoid colon 05/29/2022   Gastritis and gastroduodenitis 05/29/2022   Hiatal hernia 05/29/2022   Microcytic anemia 05/28/2022   Fecal occult blood test positive 05/28/2022   Atrial fibrillation with RVR ([place]) 05/27/2022   B12 deficiency 05/27/2022   Weakness 05/27/2022   (HFpEF) heart failure with preserved ejection fraction ([place]) 05/27/2022   Edema 05/19/2022   Muscle pain 05/19/2022   Tremor 05/19/2022   Memory change 05/19/2022   Lower back pain 01/01/2021   Pain in right hip 01/20/2020   Pain with hip hemiarthroplasty ([place]) 12/29/2019   Sciatica 10/03/2017   Elevated serum creatinine 02/12/2017   Advance care planning 03/15/2016   Skin lesion 03/15/2016   Fall at home 03/15/2016   Healthcare maintenance 03/15/2016   Cough 03/21/2015   Morbid obesity ([place]) 10/21/2014   Chronic combined systolic and diastolic CHF, NYHA class 2 ([place]) 10/21/2014   CKD (chronic kidney disease) 10/21/2014   Weight loss 07/20/2013   Appetite loss 07/20/2013   Hypokalemia 02/23/2013   Nonischemic cardiomyopathy ([place])    A-fib ([place]) 02/19/2013   Congestive dilated cardiomyopathy ([place]) 02/19/2013   Acute combined systolic and diastolic heart failure ([place]) 02/18/2013   SOB (shortness of breath) 02/15/2013   Hip fracture ([place]) 03/27/2011   Abnormal Pap smear of cervix 03/27/2011   Hyperlipidemia 03/27/2011    Post right hemicolectomy.  Advance liquids slowly.   2 Days Post-Op    LOS: 10 days   Matt B. Hassell Done, MD, [facility] [phone] to reach the surgeon on call.    06/06/2022 2:45 PM

## 2022-06-06 NOTE — Progress Notes (Addendum)
PROGRESS NOTE    Natasha Dickson  MVH:[id number] DOB: June 26, 1945 DOA: 05/27/2022 PCP: Tonia Ghent, MD  Chief Complaint  Patient presents with   Weakness    Brief Narrative:  Natasha Dickson is Natasha Dickson 76 y.o. female with medical history significant of atrial fibrillation on xarelto, systolic HF (recovered EF), non ischemic cardiomyopathy, hypertension and multiple other medical issues here with progressive generalized weakness.  Found to have new anemia and + FOBT as well as AFib with RVR. She was admitted with cardiology consultations. GI took for EGD/colonoscopy 12/20 which showed [name] "malignant completely obstructing tumor in the cecum and at the ileocecal valve," which has been found to be invasive adenocarcinoma. There were polyps in the transverse, descending, and sigmoid colon which were also sent to pathology ultimately ruling out for malignancy. CT abd/pelvis showed mesenteric LNs, no liver lesions. Rate control has been complicated by hypotension for which cardiology is consulted, maintaining marginal rate control with amiodarone with soft BP. She has received IV iron and 1u PRBCs 12/20 for progressive anemia and had heparin held due to this. Due to continued difficulty with venous access, PICC line placed, subsequently developed DVT of right brachial and axillary veins as well as SVT of bilateral cephalic veins for which therapeutic anticoagulation was reinitiated and PICC discontinued, right IJ inserted 12/24. Surgery performed hemicolectomy 12/26.    Assessment & Plan:   Principal Problem:   Atrial fibrillation with RVR (HCC) Active Problems:   B12 deficiency   Weakness   (HFpEF) heart failure with preserved ejection fraction (HCC)   Microcytic anemia   Fecal occult blood test positive   Colonic mass   Iron deficiency anemia due to chronic blood loss   Diverticulosis of colon without hemorrhage   Adenomatous polyp of transverse colon   Adenomatous polyp of descending colon    Adenomatous polyp of sigmoid colon   Gastritis and gastroduodenitis   Hiatal hernia  Invasive moderately differentiated adenocarcinoma of the cecum: Other polyps tested negative for malignancy. CT abd/pelvis staging scan showed subcentimeter mesenteric LNs and 4.5cm x 2.6cm thickening of cecum close to ileocecal junction. No hepatic abnormalities. CEA oddly only 3.6 > 4.1. Now s/p laparoscopic hand-assisted right colectomy by Dr. Thermon Leyland 12/26.  - Has multimodal pain regimen ordered - post op ileus, will continue to monitor on clears - post op recs per surgery - Wound care per surgery.  - DC foley per postop routine - Follow up with oncology after discharge. - follow surgical pathology -> invasive colonic adenocarcinoma, will discuss with oncology   Mild cognitive impairment: Based on symptom report prior to hospitalization. The patient does have capacity to make most medical decisions most of the time, but this is not guaranteed with hospital delirium.  - Any procedures or interventions requiring informed consent should include discussion with the patient's daughter and/or husband.   Acute on chronic combined HFrEF with recovered EF, HTN: Needed IVF support while taking inadequate PO.  - diuresis per cardiology, appreciate assistance - Metoprolol on hold due to soft BP per cardiology - Soft BP limiting other GDMT at this time. Holding home BP meds.  - Coronary calcifications noted on scans, though deferring ischemic evaluation at this time.   Acute hypoxic respiratory failure: Suspect pulmonary edema worsening due to intraoperative fluids, though had increased cough prior. WBC stable/normal, afebrile. - Check CXR (poor inspiratory effort, no focal infiltrate) - Provide supplemental O2 to maintain SpO2 >89% with normal WOB   Acute right brachial/axillary DVT, acute  right cephalic and left cephalic SVT: In the setting of malignancy, remains hypercoagulable.  - continue lovenox -  monitor Hb on anticoagulation   Persistent AFib with RVR:  - CHA2DS2-VASc score is 5. Anticoagulation as above - Rates improved with digoxin. BP normalizing over past 24 hours. - Continue amiodarone, converted IV > PO 12/22. - appreciate cardiology recommendations, dig/amio per cards - amiodarone 200 mg BID x2 weeks, then daily, dig prn for rate control, not maintenance   Symptomatic iron deficiency, acute on chronic blood loss anemia: Ferritin is 7, iron 15, 3% sat, TIBC up to 535. Suspect thrombocytosis is reactive to this. s/p 1u PRBC 12/22. - IV iron given, stopped oral iron with dark stools complicating ability to discern GI bleeding. Stools now Lull. - Hgb fluctuating, will follow    Vitamin B12 deficiency: Level is 166. - IM supplemented daily x7 days, now weekly   HLD: CK low.  - Statin   Stage IIIa CKD: Based on available CrCl values.  - Avoid nephrotoxins as able.  - CrCl continues improvement   Obesity: Body mass index is 39.11 kg/m.    Hypokalemia: At goal.   Central line in place, R IJ 12/24    DVT prophylaxis: lovenox Code Status: full code Family Communication: husband, daughter  Disposition:   Status is: Inpatient Remains inpatient appropriate because: need for post op care   Consultants:  Cardiology Surgery GI  Procedures:  12/26 LAPAROSCOPIC HAND ASSISTED RIGHT COLECTOMY   12/23 Summary:    Right:  Findings consistent with acute deep vein thrombosis involving the right  axillary  vein and right brachial veins. Findings consistent with acute superficial  vein  thrombosis involving the right basilic vein.    Left:  Findings consistent with acute superficial vein thrombosis involving the  left  cephalic vein.   EGD - Preparation of the colon was fair, but adequate for the purposes of this study. - Malignant completely obstructing tumor in the cecum and at the ileocecal valve. Biopsied. Tattooed. - Two 4 to 5 mm polyps in the  descending colon and in the transverse colon, removed with [name] cold snare. Resected and retrieved. - One 8 mm polyp in the sigmoid colon, removed with [name] cold snare. Resected and retrieved. - Diverticulosis in the sigmoid colon. - Non-bleeding internal hemorrhoids. - Stool in the sigmoid colon and in the descending colon. recommendation - Return patient to hospital ward for ongoing care. - Clear liquid diet today pending surgical evaluation. If no plan for inpatient surgery, will advance diet. - Continue present medications. - Await pathology results. - Consult inpatient General Surgery - Consult inpatient General Surgery/Colorectal Surgery today. - CT abdomen/pelvis for staging. Completed CT chest on 05/27/2022. - CEA. - Ok to resume heparin gtt. - IV iron on this admission. - Continue B12 and folate. - Continue serial CBC checks.  EGD - Normal esophagus. - 3 cm hiatal hernia. - Gastritis. Biopsied. - Normal mucosa was found in the gastric fundus and in the gastric body. Biopsied. - Aigner Horseman few gastric polyps. - Normal examined duodenum. Biopsied. Impression: - Perform Ardine Iacovelli colonoscopy today. - Will follow-up on pathology results. - Additional accommodations pending colonoscopy findings.  Antimicrobials:  Anti-infectives (From admission, onward)    Start     Dose/Rate Route Frequency Ordered Stop   06/04/22 0600  cefoTEtan (CEFOTAN) 2 g in sodium chloride 0.9 % 100 mL IVPB  Status:  Discontinued        2 g 200 mL/hr over 30 Minutes  Intravenous On call to O.R. 06/03/22 4132 06/04/22 0956   06/03/22 1400  neomycin (MYCIFRADIN) tablet 1,000 mg       See Hyperspace for full Linked Orders Report.   1,000 mg Oral 3 times per day 06/03/22 0841 06/03/22 2159   06/03/22 1400  metroNIDAZOLE (FLAGYL) tablet 1,000 mg       See Hyperspace for full Linked Orders Report.   1,000 mg Oral 3 times per day 06/03/22 0841 06/03/22 2116   05/31/22 0600  cefoTEtan (CEFOTAN) 2 g in sodium chloride 0.9 %  100 mL IVPB        2 g 200 mL/hr over 30 Minutes Intravenous On call to O.R. 05/30/22 1716 06/01/22 0559   05/30/22 2200  neomycin (MYCIFRADIN) tablet 1,000 mg       See Hyperspace for full Linked Orders Report.   1,000 mg Oral 3 times per day 05/30/22 1716 05/30/22 2238   05/30/22 2200  metroNIDAZOLE (FLAGYL) tablet 1,000 mg       See Hyperspace for full Linked Orders Report.   1,000 mg Oral 3 times per day 05/30/22 1716 05/30/22 2128       Subjective: C/o reflux, throwing up after she swallows  Objective: Vitals:   06/05/22 0341 06/05/22 0852 06/05/22 1618 06/05/22 1927  BP:  116/74 (!) 122/56 (!) 146/72  Pulse:  71 91 77  Resp:  '16 18 16  '$ Temp:  98.3 F (36.8 C) 98 F (36.7 C) 97.6 F (36.4 C)  TempSrc:  Oral Oral Oral  SpO2:  97% 96% 97%  Weight: 94.3 kg     Height:        Intake/Output Summary (Last 24 hours) at 06/05/2022 1940 Last data filed at 06/05/2022 1400 Gross per 24 hour  Intake 240 ml  Output 1751 ml  Net -1511 ml   Filed Weights   05/27/22 1121 06/04/22 0413 06/05/22 0341  Weight: 93 kg 93.9 kg 94.3 kg    Examination:  General: No acute distress. Cardiovascular: irregularly irregular, mildly tachy Lungs: unlabored Abdomen: Soft, nontender, nondistended, midline staples Neurological: Alert and oriented 3. Moves all extremities 4 with equal strength. Cranial nerves II through XII grossly intact. Extremities: No clubbing or cyanosis. No edema.  Data Reviewed: I have personally reviewed following labs and imaging studies  CBC: Recent Labs  Lab 06/01/22 0015 06/02/22 0623 06/02/22 1122 06/02/22 1950 06/03/22 0452 06/04/22 0232 06/05/22 0357  WBC 8.1 8.9  --   --  9.6 8.1 11.4*  HGB 8.3* 8.2* 9.1* 8.7* 8.4* 9.0* 8.1*  HCT 27.7* 26.7* 28.9* 28.8* 27.5* 30.1* 26.1*  MCV 81.0 79.7*  --   --  81.8 83.1 81.1  PLT 279 238  --   --  222 222 440    Basic Metabolic Panel: Recent Labs  Lab 06/01/22 0015 06/02/22 0626 06/03/22 0452  06/04/22 0232 06/05/22 0357  NA 139 139 136 136 135  K 3.4* 3.8 4.0 4.4 4.2  CL 113* 109 109 107 106  CO2 21* 20* '22 22 23  '$ GLUCOSE 99 112* 93 83 110*  BUN 8 6* 6* 8 9  CREATININE 1.00 1.01* 0.90 0.89 0.91  CALCIUM 8.1* 8.4* 7.9* 8.0* 7.9*  MG 2.1  --   --   --   --     GFR: Estimated Creatinine Clearance: 55.1 mL/min (by C-G formula based on SCr of 0.91 mg/dL).  Liver Function Tests: No results for input(s): "AST", "ALT", "ALKPHOS", "BILITOT", "PROT", "ALBUMIN" in the last 168 hours.  CBG: No results for input(s): "GLUCAP" in the last 168 hours.   Recent Results (from the past 240 hour(s))  Resp panel by RT-PCR (RSV, Flu Hazelynn Mckenny&B, Covid) Anterior Nasal Swab     Status: None   Collection Time: 05/27/22 11:15 AM   Specimen: Anterior Nasal Swab  Result Value Ref Range Status   SARS Coronavirus 2 by RT PCR NEGATIVE NEGATIVE Final    Comment: (NOTE) SARS-CoV-2 target nucleic acids are NOT DETECTED.  The SARS-CoV-2 RNA is generally detectable in upper respiratory specimens during the acute phase of infection. The lowest concentration of SARS-CoV-2 viral copies this assay can detect is 138 copies/mL. Chantry Headen negative result does not preclude SARS-Cov-2 infection and should not be used as the sole basis for treatment or other patient management decisions. Yaneliz Radebaugh negative result may occur with  improper specimen collection/handling, submission of specimen other than nasopharyngeal swab, presence of viral mutation(s) within the areas targeted by this assay, and inadequate number of viral copies(<138 copies/mL). Dorise Gangi negative result must be combined with clinical observations, patient history, and epidemiological information. The expected result is Negative.  Fact Sheet for Patients:  EntrepreneurPulse.com.au  Fact Sheet for Healthcare Providers:  IncredibleEmployment.be  This test is no t yet approved or cleared by the [place] FDA and  has been  authorized for detection and/or diagnosis of SARS-CoV-2 by FDA under an Emergency Use Authorization (EUA). This EUA will remain  in effect (meaning this test can be used) for the duration of the COVID-19 declaration under Section 564(b)(1) of the Act, 21 U.S.C.section 360bbb-3(b)(1), unless the authorization is terminated  or revoked sooner.       Influenza Zahari Xiang by PCR NEGATIVE NEGATIVE Final   Influenza B by PCR NEGATIVE NEGATIVE Final    Comment: (NOTE) The Xpert Xpress SARS-CoV-2/FLU/RSV plus assay is intended as an aid in the diagnosis of influenza from Nasopharyngeal swab specimens and should not be used as Tam Savoia sole basis for treatment. Nasal washings and aspirates are unacceptable for Xpert Xpress SARS-CoV-2/FLU/RSV testing.  Fact Sheet for Patients: EntrepreneurPulse.com.au  Fact Sheet for Healthcare Providers: IncredibleEmployment.be  This test is not yet approved or cleared by the [place] FDA and has been authorized for detection and/or diagnosis of SARS-CoV-2 by FDA under an Emergency Use Authorization (EUA). This EUA will remain in effect (meaning this test can be used) for the duration of the COVID-19 declaration under Section 564(b)(1) of the Act, 21 U.S.C. section 360bbb-3(b)(1), unless the authorization is terminated or revoked.     Resp Syncytial Virus by PCR NEGATIVE NEGATIVE Final    Comment: (NOTE) Fact Sheet for Patients: EntrepreneurPulse.com.au  Fact Sheet for Healthcare Providers: IncredibleEmployment.be  This test is not yet approved or cleared by the [place] FDA and has been authorized for detection and/or diagnosis of SARS-CoV-2 by FDA under an Emergency Use Authorization (EUA). This EUA will remain in effect (meaning this test can be used) for the duration of the COVID-19 declaration under Section 564(b)(1) of the Act, 21 U.S.C. section 360bbb-3(b)(1), unless the  authorization is terminated or revoked.  Performed at [facility], [place] [address], [place], [place] [address]   Surgical pcr screen     Status: Abnormal   Collection Time: 06/03/22  8:12 PM   Specimen: Nasal Mucosa; Nasal Swab  Result Value Ref Range Status   MRSA, PCR NEGATIVE NEGATIVE Final   Staphylococcus aureus POSITIVE (Krislyn Donnan) NEGATIVE Final    Comment: (NOTE) The Xpert SA Assay (FDA approved for NASAL specimens  in patients 8 years of age and older), is one component of Margene Cherian comprehensive surveillance program. It is not intended to diagnose infection nor to guide or monitor treatment. Performed at [facility], [place] [address], [place], [place] [address]          Radiology Studies: DG CHEST PORT 1 VIEW  Result Date: 06/04/2022 CLINICAL DATA:  Cough EXAM: PORTABLE CHEST 1 VIEW COMPARISON:  05/27/2022 FINDINGS: Overall inspiratory effort is poor. Heart is mildly enlarged but stable. Aortic calcifications are again seen. Right jugular central line is noted with the tip in the mid right atrium. No pneumothorax is seen. No focal infiltrate or effusion is noted. IMPRESSION: Poor inspiratory effort when compare with the prior exam although no focal infiltrate is seen. No pneumothorax following central line placement is noted. Electronically Signed   By: Inez Catalina M.D.   On: 06/04/2022 19:02        Scheduled Meds:  acetaminophen  650 mg Oral Q6H   alteplase  2 mg Intracatheter Once   alteplase  2 mg Intracatheter Once   alvimopan  12 mg Oral BID   amiodarone  200 mg Oral BID   atorvastatin  10 mg Oral Daily   Chlorhexidine Gluconate Cloth  6 each Topical Daily   [START ON 06/09/2022] cyanocobalamin  1,000 mcg Intramuscular Q Sun   docusate sodium  100 mg Oral BID   enoxaparin (LOVENOX) injection  90 mg Subcutaneous Q12H   feeding supplement  237 mL Oral TID BM   folic acid  1 mg Oral Daily   gabapentin  300 mg Oral TID   mupirocin ointment  1 Application Nasal  BID   sodium chloride flush  10-40 mL Intracatheter Q12H   Continuous Infusions:  methocarbamol (ROBAXIN) IV       LOS: 9 days    Time spent: over 30 min    Fayrene Helper, MD [facility]   To contact the attending provider between 7A-7P or the covering provider during after hours 7P-7A, please log into the web site [email or web address] and access using universal [place] password for that web site. If you do not have the password, please call the hospital operator.  06/05/2022, 7:40 PM

## 2022-06-06 NOTE — Progress Notes (Addendum)
Rounding Note    Patient Name: Natasha Dickson Date of Encounter: 06/06/2022  [place] Cardiologist: Freada Bergeron, MD   Subjective   No CP or SOB, +N/V  Inpatient Medications    Scheduled Meds:  acetaminophen  650 mg Oral Q6H   alteplase  2 mg Intracatheter Once   alteplase  2 mg Intracatheter Once   alvimopan  12 mg Oral BID   amiodarone  200 mg Oral BID   atorvastatin  10 mg Oral Daily   Chlorhexidine Gluconate Cloth  6 each Topical Daily   [START ON 06/09/2022] cyanocobalamin  1,000 mcg Intramuscular Q Sun   docusate sodium  100 mg Oral BID   enoxaparin (LOVENOX) injection  90 mg Subcutaneous Q12H   feeding supplement  237 mL Oral TID BM   folic acid  1 mg Oral Daily   gabapentin  300 mg Oral TID   ipratropium-albuterol  3 mL Nebulization STAT   mupirocin ointment  1 Application Nasal BID   sodium chloride flush  10-40 mL Intracatheter Q12H   Continuous Infusions:  methocarbamol (ROBAXIN) IV      PRN Meds:    Vital Signs    Vitals:   06/05/22 1618 06/05/22 1927 06/05/22 2326 06/06/22 0326  BP: (!) 122/56 (!) 146/72 (!) 135/54 107/61  Pulse: 91 77 91 90  Resp: '18 16 14 11  '$ Temp: 98 F (36.7 C) 97.6 F (36.4 C) (!) 97.4 F (36.3 C) (!) 97.5 F (36.4 C)  TempSrc: Oral Oral Oral Oral  SpO2: 96% 97% 98% 96%  Weight:    95.7 kg  Height:        Intake/Output Summary (Last 24 hours) at 06/06/2022 0854 Last data filed at 06/06/2022 0300 Gross per 24 hour  Intake 120 ml  Output 2200 ml  Net -2080 ml      06/06/2022    3:26 AM 06/05/2022    3:41 AM 06/04/2022    4:13 AM  Last 3 Weights  Weight (lbs) 210 lb 15.7 oz 207 lb 14.3 oz 207 lb 0.2 oz  Weight (kg) 95.7 kg 94.3 kg 93.9 kg      Telemetry    Afib rate 100 - Personally Reviewed  ECG    No new tracing- Personally Reviewed  Physical Exam   GEN: No acute distress.  Laying in bed comfortable Neck: No JVD, tunneled CVC R side Cardiac: Irregularly irregular, no  murmurs Respiratory: Clear to auscultation bilaterally. GI: Soft, nontender, non-distended  MS: 1+ puffy UE edema, warm, trace LE edema Neuro:  Nonfocal  Psych: Normal affect   Labs    High Sensitivity Troponin:   Recent Labs  Lab 05/27/22 1143 05/27/22 1809  TROPONINIHS 5 5     Chemistry Recent Labs  Lab 06/01/22 0015 06/02/22 0626 06/04/22 0232 06/05/22 0357 06/06/22 0443  NA 139   < > 136 135 136  K 3.4*   < > 4.4 4.2 3.5  CL 113*   < > 107 106 103  CO2 21*   < > '22 23 26  '$ GLUCOSE 99   < > 83 110* 94  BUN 8   < > '8 9 11  '$ CREATININE 1.00   < > 0.89 0.91 0.95  CALCIUM 8.1*   < > 8.0* 7.9* 7.9*  MG 2.1  --   --   --   --   GFRNONAA 58*   < > >60 >60 >60  ANIONGAP 5   < >  $'7 6 7   'e$ < > = values in this interval not displayed.    Lipids  No results for input(s): "CHOL", "TRIG", "HDL", "LABVLDL", "LDLCALC", "CHOLHDL" in the last 168 hours.  Hematology Recent Labs  Lab 06/04/22 0232 06/05/22 0357 06/05/22 2030 06/06/22 0443  WBC 8.1 11.4*  --  10.7*  RBC 3.62* 3.22*  --  3.46*  HGB 9.0* 8.1* 8.4* 8.5*  HCT 30.1* 26.1* 26.8* 28.5*  MCV 83.1 81.1  --  82.4  MCH 24.9* 25.2*  --  24.6*  MCHC 29.9* 31.0  --  29.8*  RDW 21.2* 20.7*  --  21.3*  PLT 222 221  --  245   Thyroid  Recent Labs  Lab 05/31/22 0146  TSH 5.363*    BNP Recent Labs  Lab 06/01/22 1030  BNP 313.1*    DDimer No results for input(s): "DDIMER" in the last 168 hours.   Radiology    DG CHEST PORT 1 VIEW  Result Date: 06/04/2022 CLINICAL DATA:  Cough EXAM: PORTABLE CHEST 1 VIEW COMPARISON:  05/27/2022 FINDINGS: Overall inspiratory effort is poor. Heart is mildly enlarged but stable. Aortic calcifications are again seen. Right jugular central line is noted with the tip in the mid right atrium. No pneumothorax is seen. No focal infiltrate or effusion is noted. IMPRESSION: Poor inspiratory effort when compare with the prior exam although no focal infiltrate is seen. No pneumothorax following  central line placement is noted. Electronically Signed   By: Inez Catalina M.D.   On: 06/04/2022 19:02    Cardiac Studies   TTE 04/16/22: IMPRESSIONS     1. Left ventricular ejection fraction, by estimation, is 55 to 60%. The  left ventricle has normal function. The left ventricle has no regional  wall motion abnormalities. Left ventricular diastolic parameters are  indeterminate.   2. Right ventricular systolic function is normal. The right ventricular  size is normal. There is normal pulmonary artery systolic pressure.   3. Left atrial size was mildly dilated.   4. The mitral valve is normal in structure. Mild mitral valve  regurgitation. No evidence of mitral stenosis.   5. Tricuspid valve regurgitation is mild to moderate.   6. The aortic valve is tricuspid. Aortic valve regurgitation is not  visualized. Aortic valve sclerosis is present, with no evidence of aortic  valve stenosis.   7. The inferior vena cava is normal in size with greater than 50%  respiratory variability, suggesting right atrial pressure of 3 mmHg.   Patient Profile     76 y.o. female  with a hx of combined systolic and diastolic HF thought to be tachycardia induced CM in the setting of Aib with RVR, persistent Afib on rate control strategy, HTN, HLD and CKD who presented to the ER with progressive weakness found to be anemic and in Afib with RVR for which Cardiology was consulted. Underwent EGD/Colo on 05/29/22 which revealed obstructing colonic mass.   Assessment & Plan    Obstructing Mass at the [facility] - s/p laparoscopic, hand assisted, right colectomy 06/04/22, surgery following.   Persistent Afib Secondary hypercoagulable state  CHADs-vasc 5. Patient has known chronic Afib for which she has been maintained on rate control strategy. Presented on this admission with increasing weakness and SOB found to be in Afib with RVR with HR 110-130s. BP soft on admission and could not tolerate nodal agents and  therefore amiodarone was started. She was also found to be anemic (9.7) with positive stool guaiac now found  to have obstructing mass on colonoscopy. Xarelto was held and heparin was started.   - amiodarone 200 mg BID for total of 2 weeks then to 200 mg daily - resume AC when Hgb stable and when safe from post surgical standpoint. - rates improved with dig loading 12/25, needs dig level today will order. Digoxin as needed for rate control, not maintenance.   Microcytic Anemia Low B12  HgB 9 on admission and was previously 13 in 2021. Fecal occult positive and found to have obstructing colonic mass on colonoscopy as above. HGB down to 7.9.  Management of colonic mass per surgery consider transfusion if Hgb <8.  Chronic Combined Systolic and Diastolic HF with Improved EF: EF dropped to 30-35% 2014 thought to be secondary to tachy induced CM in the setting of Afib with RVR. Improved back to 55-60% in 04/2022. Holding GDMT due to soft blood pressures.  Low dose metoprolol added by overnight fellow, can continue as tolerated prn, HR >120 Will add back GDMT as able pending blood pressures Will give lasix 20 mg IV again today, likely resume oral tomorrow. She is 3.6 L positive for admission.    HTN  Holding home meds currently, she is normotensive and likely needs gentle diuresis for fluid balance post op.   CKD Stage II:  - Creatinine stable    HLD  - Continue lipitor '10mg'$  daily  No barriers from cardiology standpoint to disposition planning. She has persistent afib and rates are generally well controlled when comfortable. Cardiology will follow peripherally as she remains in hospital.    For questions or updates, please contact [place] Please consult [email or web address] for contact info under

## 2022-06-07 ENCOUNTER — Inpatient Hospital Stay (HOSPITAL_COMMUNITY): Payer: Medicare Other

## 2022-06-07 DIAGNOSIS — I4891 Unspecified atrial fibrillation: Secondary | ICD-10-CM | POA: Diagnosis not present

## 2022-06-07 LAB — COMPREHENSIVE METABOLIC PANEL
ALT: 17 U/L (ref 0–44)
AST: 31 U/L (ref 15–41)
Albumin: 2.5 g/dL — ABNORMAL LOW (ref 3.5–5.0)
Alkaline Phosphatase: 50 U/L (ref 38–126)
Anion gap: 11 (ref 5–15)
BUN: 10 mg/dL (ref 8–23)
CO2: 27 mmol/L (ref 22–32)
Calcium: 8 mg/dL — ABNORMAL LOW (ref 8.9–10.3)
Chloride: 102 mmol/L (ref 98–111)
Creatinine, Ser: 1.02 mg/dL — ABNORMAL HIGH (ref 0.44–1.00)
GFR, Estimated: 57 mL/min — ABNORMAL LOW (ref >60)
Glucose, Bld: 90 mg/dL (ref 70–99)
Potassium: 3.3 mmol/L — ABNORMAL LOW (ref 3.5–5.1)
Sodium: 140 mmol/L (ref 135–145)
Total Bilirubin: 0.6 mg/dL (ref 0.3–1.2)
Total Protein: 5.6 g/dL — ABNORMAL LOW (ref 6.5–8.1)

## 2022-06-07 LAB — PHOSPHORUS: Phosphorus: 3.2 mg/dL (ref 2.5–4.6)

## 2022-06-07 LAB — CBC WITH DIFFERENTIAL/PLATELET
Abs Immature Granulocytes: 0.23 10*3/uL — ABNORMAL HIGH (ref 0.00–0.07)
Basophils Absolute: 0 10*3/uL (ref 0.0–0.1)
Basophils Relative: 0 %
Eosinophils Absolute: 0 10*3/uL (ref 0.0–0.5)
Eosinophils Relative: 1 %
HCT: 30.5 % — ABNORMAL LOW (ref 36.0–46.0)
Hemoglobin: 8.9 g/dL — ABNORMAL LOW (ref 12.0–15.0)
Immature Granulocytes: 3 %
Lymphocytes Relative: 16 %
Lymphs Abs: 1.1 10*3/uL (ref 0.7–4.0)
MCH: 24.3 pg — ABNORMAL LOW (ref 26.0–34.0)
MCHC: 29.2 g/dL — ABNORMAL LOW (ref 30.0–36.0)
MCV: 83.3 fL (ref 80.0–100.0)
Monocytes Absolute: 0.5 10*3/uL (ref 0.1–1.0)
Monocytes Relative: 8 %
Neutro Abs: 4.9 10*3/uL (ref 1.7–7.7)
Neutrophils Relative %: 72 %
Platelets: 266 10*3/uL (ref 150–400)
RBC: 3.66 MIL/uL — ABNORMAL LOW (ref 3.87–5.11)
RDW: 21.7 % — ABNORMAL HIGH (ref 11.5–15.5)
Smear Review: NORMAL
WBC: 6.8 10*3/uL (ref 4.0–10.5)
nRBC: 0.6 % — ABNORMAL HIGH (ref 0.0–0.2)

## 2022-06-07 LAB — RESP PANEL BY RT-PCR (RSV, FLU A&B, COVID)  RVPGX2
Influenza A by PCR: NEGATIVE
Influenza B by PCR: POSITIVE — AB
Resp Syncytial Virus by PCR: NEGATIVE
SARS Coronavirus 2 by RT PCR: NEGATIVE

## 2022-06-07 LAB — MAGNESIUM: Magnesium: 2.1 mg/dL (ref 1.7–2.4)

## 2022-06-07 MED ORDER — GABAPENTIN 300 MG PO CAPS
300.0000 mg | ORAL_CAPSULE | Freq: Three times a day (TID) | ORAL | Status: DC
  Administered 2022-06-07 – 2022-06-12 (×16): 300 mg via ORAL
  Filled 2022-06-07 (×16): qty 1

## 2022-06-07 MED ORDER — OSELTAMIVIR PHOSPHATE 75 MG PO CAPS
75.0000 mg | ORAL_CAPSULE | Freq: Once | ORAL | Status: AC
  Administered 2022-06-07: 75 mg via ORAL
  Filled 2022-06-07: qty 1

## 2022-06-07 MED ORDER — POTASSIUM CHLORIDE 20 MEQ PO PACK
40.0000 meq | PACK | ORAL | Status: AC
  Administered 2022-06-07 (×2): 40 meq via ORAL
  Filled 2022-06-07 (×2): qty 2

## 2022-06-07 MED ORDER — OSELTAMIVIR PHOSPHATE 30 MG PO CAPS
30.0000 mg | ORAL_CAPSULE | Freq: Two times a day (BID) | ORAL | Status: AC
  Administered 2022-06-07 – 2022-06-11 (×9): 30 mg via ORAL
  Filled 2022-06-07 (×9): qty 1

## 2022-06-07 MED ORDER — METOPROLOL SUCCINATE ER 25 MG PO TB24
25.0000 mg | ORAL_TABLET | Freq: Every day | ORAL | Status: DC
  Administered 2022-06-07 – 2022-06-12 (×6): 25 mg via ORAL
  Filled 2022-06-07 (×6): qty 1

## 2022-06-07 MED ORDER — FUROSEMIDE 10 MG/ML IJ SOLN
20.0000 mg | Freq: Once | INTRAMUSCULAR | Status: AC
  Administered 2022-06-07: 20 mg via INTRAVENOUS
  Filled 2022-06-07: qty 2

## 2022-06-07 MED ORDER — FUROSEMIDE 20 MG PO TABS
20.0000 mg | ORAL_TABLET | Freq: Every day | ORAL | Status: DC
  Administered 2022-06-08 – 2022-06-12 (×5): 20 mg via ORAL
  Filled 2022-06-07 (×5): qty 1

## 2022-06-07 NOTE — Progress Notes (Signed)
Rounding Note    Patient Name: Natasha Dickson Date of Encounter: 06/07/2022  [place] Cardiologist: Freada Bergeron, MD   Subjective   No CP or SOB, +N/V, seen immediately after surgeon visited, they feel she is progressing well.   Inpatient Medications    Scheduled Meds:  acetaminophen  650 mg Oral Q6H   alteplase  2 mg Intracatheter Once   alteplase  2 mg Intracatheter Once   alvimopan  12 mg Oral BID   amiodarone  200 mg Oral BID   atorvastatin  10 mg Oral Daily   Chlorhexidine Gluconate Cloth  6 each Topical Daily   [START ON 06/09/2022] cyanocobalamin  1,000 mcg Intramuscular Q Sun   digoxin  0.25 mg Intravenous TID   docusate sodium  100 mg Oral BID   enoxaparin (LOVENOX) injection  90 mg Subcutaneous Q12H   feeding supplement  237 mL Oral TID BM   folic acid  1 mg Oral Daily   furosemide  20 mg Oral Daily   gabapentin  300 mg Oral TID   metoprolol succinate  12.5 mg Oral Daily   mupirocin ointment  1 Application Nasal BID   pantoprazole (PROTONIX) IV  40 mg Intravenous QHS   sodium chloride flush  10-40 mL Intracatheter Q12H   Continuous Infusions:  methocarbamol (ROBAXIN) IV      PRN Meds:    Vital Signs    Vitals:   06/06/22 1651 06/06/22 2128 06/07/22 0018 06/07/22 0426  BP: 113/60 133/61 133/78 (!) 141/63  Pulse: (!) 105 (!) 103 95 87  Resp: 20 (!) '21 18 18  '$ Temp: 98.2 F (36.8 C) 98.1 F (36.7 C) 98.4 F (36.9 C) 98.2 F (36.8 C)  TempSrc: Oral Oral Axillary Axillary  SpO2: 97% 99% 96% 95%  Weight:    92.3 kg  Height:        Intake/Output Summary (Last 24 hours) at 06/07/2022 0733 Last data filed at 06/07/2022 0300 Gross per 24 hour  Intake 720 ml  Output 850 ml  Net -130 ml      06/07/2022    4:26 AM 06/06/2022    3:26 AM 06/05/2022    3:41 AM  Last 3 Weights  Weight (lbs) 203 lb 7.8 oz 210 lb 15.7 oz 207 lb 14.3 oz  Weight (kg) 92.3 kg 95.7 kg 94.3 kg      Telemetry    Afib rate 100 - Personally  Reviewed  ECG    No new tracing- Personally Reviewed  Physical Exam   GEN: No acute distress.  Laying in bed comfortable Neck: No JVD Cardiac: Irregularly irregular, no murmurs Respiratory: Clear to auscultation bilaterally. GI: Soft, nontender, non-distended  MS: puffy trace LE edema. Neuro:  Nonfocal  Psych: Normal affect   Labs    High Sensitivity Troponin:   Recent Labs  Lab 05/27/22 1143 05/27/22 1809  TROPONINIHS 5 5     Chemistry Recent Labs  Lab 06/01/22 0015 06/02/22 0626 06/05/22 0357 06/06/22 0443 06/07/22 0321  NA 139   < > 135 136 140  K 3.4*   < > 4.2 3.5 3.3*  CL 113*   < > 106 103 102  CO2 21*   < > '23 26 27  '$ GLUCOSE 99   < > 110* 94 90  BUN 8   < > '9 11 10  '$ CREATININE 1.00   < > 0.91 0.95 1.02*  CALCIUM 8.1*   < > 7.9* 7.9* 8.0*  MG  2.1  --   --   --  2.1  PROT  --   --   --   --  5.6*  ALBUMIN  --   --   --   --  2.5*  AST  --   --   --   --  31  ALT  --   --   --   --  17  ALKPHOS  --   --   --   --  50  BILITOT  --   --   --   --  0.6  GFRNONAA 58*   < > >60 >60 57*  ANIONGAP 5   < > '6 7 11   '$ < > = values in this interval not displayed.    Lipids  No results for input(s): "CHOL", "TRIG", "HDL", "LABVLDL", "LDLCALC", "CHOLHDL" in the last 168 hours.  Hematology Recent Labs  Lab 06/05/22 0357 06/05/22 2030 06/06/22 0443 06/07/22 0321  WBC 11.4*  --  10.7* 6.8  RBC 3.22*  --  3.46* 3.66*  HGB 8.1* 8.4* 8.5* 8.9*  HCT 26.1* 26.8* 28.5* 30.5*  MCV 81.1  --  82.4 83.3  MCH 25.2*  --  24.6* 24.3*  MCHC 31.0  --  29.8* 29.2*  RDW 20.7*  --  21.3* 21.7*  PLT 221  --  245 266   Thyroid  No results for input(s): "TSH", "FREET4" in the last 168 hours.   BNP Recent Labs  Lab 06/01/22 1030  BNP 313.1*    DDimer No results for input(s): "DDIMER" in the last 168 hours.   Radiology    No results found.  Cardiac Studies   TTE 04/16/22: IMPRESSIONS     1. Left ventricular ejection fraction, by estimation, is 55 to 60%. The   left ventricle has normal function. The left ventricle has no regional  wall motion abnormalities. Left ventricular diastolic parameters are  indeterminate.   2. Right ventricular systolic function is normal. The right ventricular  size is normal. There is normal pulmonary artery systolic pressure.   3. Left atrial size was mildly dilated.   4. The mitral valve is normal in structure. Mild mitral valve  regurgitation. No evidence of mitral stenosis.   5. Tricuspid valve regurgitation is mild to moderate.   6. The aortic valve is tricuspid. Aortic valve regurgitation is not  visualized. Aortic valve sclerosis is present, with no evidence of aortic  valve stenosis.   7. The inferior vena cava is normal in size with greater than 50%  respiratory variability, suggesting right atrial pressure of 3 mmHg.   Patient Profile     76 y.o. female  with a hx of combined systolic and diastolic HF thought to be tachycardia induced CM in the setting of Aib with RVR, persistent Afib on rate control strategy, HTN, HLD and CKD who presented to the ER with progressive weakness found to be anemic and in Afib with RVR for which Cardiology was consulted. Underwent EGD/Colo on 05/29/22 which revealed obstructing colonic mass.   Assessment & Plan    Obstructing Mass at the [facility] - s/p laparoscopic, hand assisted, right colectomy 06/04/22, surgery following. PT to see.   Persistent Afib Secondary hypercoagulable state  CHADs-vasc 5. Patient has known chronic Afib for which she has been maintained on rate control strategy. Presented on this admission with increasing weakness and SOB found to be in Afib with RVR with HR 110-130s. BP soft on admission and could not tolerate  nodal agents and therefore amiodarone was started. She was also found to be anemic (9.7) with positive stool guaiac now found to have obstructing mass on colonoscopy. Xarelto was held and heparin was started, held for surgery.  - amiodarone 200  mg BID for total of 2 weeks (last day 06/15/22) then to 200 mg daily - resume AC when Hgb stable and when safe from post surgical standpoint. - rates improved with dig loading 12/25, 12/28. Dig level 12/30 pending. - will uptitrate metoprolol succ to 25 mg daily today, continue to uptitrate rate control as tolerated.   Microcytic Anemia Low B12  HgB 9 on admission and was previously 13 in 2021. Fecal occult positive and found to have obstructing colonic mass on colonoscopy as above. HGB down to 7.9.  Management of colonic mass per surgery consider transfusion if Hgb <8.  Chronic Combined Systolic and Diastolic HF with Improved EF: EF dropped to 30-35% 2014 thought to be secondary to tachy induced CM in the setting of Afib with RVR. Improved back to 55-60% in 04/2022. Holding GDMT due to soft blood pressures.  Low dose metoprolol added for rate control. Will add back GDMT as able pending blood pressures, focus on uptitration of rate control while inpatient. Redose lasix 20 mg IV today, she is still 3.4 L positive for admission, likely transition to oral 20 mg daily tomorrow given mild uptrend in Cr. Hypokalemia - replete.   HTN  Holding home meds currently, she is normotensive and likely needs gentle diuresis for fluid balance post op.   CKD Stage II:  - Creatinine stable    HLD  - Continue lipitor '10mg'$  daily  No barriers from cardiology standpoint to disposition planning. She has persistent afib and rates are generally well controlled when comfortable. Cardiology will follow peripherally as she remains in hospital.    For questions or updates, please contact [place] Please consult [email or web address] for contact info under

## 2022-06-07 NOTE — Progress Notes (Signed)
3 Days Post-Op   Subjective/Chief Complaint: Patient moving bowels.  Minimal pain.   Objective: Vital signs in last 24 hours: Temp:  [97.8 F (36.6 C)-98.4 F (36.9 C)] 98.4 F (36.9 C) (12/29 0712) Pulse Rate:  [82-141] 82 (12/29 0712) Resp:  [15-21] 17 (12/29 0712) BP: (113-156)/(43-78) 156/70 (12/29 0712) SpO2:  [95 %-99 %] 96 % (12/29 0712) Weight:  [92.3 kg] 92.3 kg (12/29 0426) Last BM Date : 06/06/22  Intake/Output from previous day: 12/28 0701 - 12/29 0700 In: 720 [P.O.:720] Out: 850 [Urine:850] Intake/Output this shift: No intake/output data recorded.  Abdomen: Incision clean dry intact.  Soft.  Appropriately sore around the incision.  No rebound or guarding.  Lab Results:  Recent Labs    06/06/22 0443 06/07/22 0321  WBC 10.7* 6.8  HGB 8.5* 8.9*  HCT 28.5* 30.5*  PLT 245 266   BMET Recent Labs    06/06/22 0443 06/07/22 0321  NA 136 140  K 3.5 3.3*  CL 103 102  CO2 26 27  GLUCOSE 94 90  BUN 11 10  CREATININE 0.95 1.02*  CALCIUM 7.9* 8.0*   PT/INR No results for input(s): "LABPROT", "INR" in the last 72 hours. ABG No results for input(s): "PHART", "HCO3" in the last 72 hours.  Invalid input(s): "PCO2", "PO2"  Studies/Results: No results found.  Anti-infectives: Anti-infectives (From admission, onward)    Start     Dose/Rate Route Frequency Ordered Stop   06/04/22 0600  cefoTEtan (CEFOTAN) 2 g in sodium chloride 0.9 % 100 mL IVPB  Status:  Discontinued        2 g 200 mL/hr over 30 Minutes Intravenous On call to O.R. 06/03/22 2707 06/04/22 0956   06/03/22 1400  neomycin (MYCIFRADIN) tablet 1,000 mg       See Hyperspace for full Linked Orders Report.   1,000 mg Oral 3 times per day 06/03/22 0841 06/03/22 2159   06/03/22 1400  metroNIDAZOLE (FLAGYL) tablet 1,000 mg       See Hyperspace for full Linked Orders Report.   1,000 mg Oral 3 times per day 06/03/22 0841 06/03/22 2116   05/31/22 0600  cefoTEtan (CEFOTAN) 2 g in sodium chloride 0.9 %  100 mL IVPB        2 g 200 mL/hr over 30 Minutes Intravenous On call to O.R. 05/30/22 1716 06/01/22 0559   05/30/22 2200  neomycin (MYCIFRADIN) tablet 1,000 mg       See Hyperspace for full Linked Orders Report.   1,000 mg Oral 3 times per day 05/30/22 1716 05/30/22 2238   05/30/22 2200  metroNIDAZOLE (FLAGYL) tablet 1,000 mg       See Hyperspace for full Linked Orders Report.   1,000 mg Oral 3 times per day 05/30/22 1716 05/30/22 2128       Assessment/Plan: s/p Procedure(s): LAPAROSCOPIC HAND ASSISTED RIGHT COLECTOMY (Right) Advance diet  PT consult for mobilization  Cardiology following  LOS: 11 days    Natasha Dickson A Natasha Dickson 06/07/2022

## 2022-06-07 NOTE — Progress Notes (Signed)
PROGRESS NOTE    Natasha Dickson  VPX:[id number] DOB: 1946/02/16 DOA: 05/27/2022 PCP: Tonia Ghent, MD  Chief Complaint  Patient presents with   Weakness    Brief Narrative:  Natasha Dickson is Natasha Dickson 76 y.o. female with medical history significant of atrial fibrillation on xarelto, systolic HF (recovered EF), non ischemic cardiomyopathy, hypertension and multiple other medical issues here with progressive generalized weakness.  Found to have new anemia and + FOBT as well as AFib with RVR. She was admitted with cardiology consultations. GI took for EGD/colonoscopy 12/20 which showed Natasha Dickson "malignant completely obstructing tumor in the cecum and at the ileocecal valve," which has been found to be invasive adenocarcinoma. There were polyps in the transverse, descending, and sigmoid colon which were also sent to pathology ultimately ruling out for malignancy. CT abd/pelvis showed mesenteric LNs, no liver lesions. Rate control has been complicated by hypotension for which cardiology is consulted, maintaining marginal rate control with amiodarone with soft BP. She has received IV iron and 1u PRBCs 12/20 for progressive anemia and had heparin held due to this. Due to continued difficulty with venous access, PICC line placed, subsequently developed DVT of right brachial and axillary veins as well as SVT of bilateral cephalic veins for which therapeutic anticoagulation was reinitiated and PICC discontinued, right IJ inserted 12/24. Surgery performed hemicolectomy 12/26.    Assessment & Plan:   Principal Problem:   Atrial fibrillation with RVR (HCC) Active Problems:   B12 deficiency   Weakness   (HFpEF) heart failure with preserved ejection fraction (HCC)   Microcytic anemia   Fecal occult blood test positive   Colonic mass   Iron deficiency anemia due to chronic blood loss   Diverticulosis of colon without hemorrhage   Adenomatous polyp of transverse colon   Adenomatous polyp of descending colon    Adenomatous polyp of sigmoid colon   Gastritis and gastroduodenitis   Hiatal hernia  Invasive moderately differentiated adenocarcinoma of the cecum: Other polyps tested negative for malignancy. CT abd/pelvis staging scan showed subcentimeter mesenteric LNs and 4.5cm x 2.6cm thickening of cecum close to ileocecal junction. No hepatic abnormalities. CEA oddly only 3.6 > 4.1. Now s/p laparoscopic hand-assisted right colectomy by Dr. Thermon Leyland 12/26.  - Has multimodal pain regimen ordered - post op ileus, will continue to monitor on clears - post op recs per surgery - soft diet per surgery  - Wound care per surgery.  - DC foley per postop routine - Follow up with oncology after discharge. - follow surgical pathology -> invasive colonic adenocarcinoma, will need to discuss with oncology (plan for Monday)   Influenza B Infection  - discussed tamiflu, she's agreeable - likely contributing to delirium   Delirium  Mild cognitive impairment:  - delirious today, inattentive - family concerned about possible stroke, I think this is less likely based on her exam, but will follow MRI brain - delirium precautions   Acute on chronic combined HFrEF with recovered EF, HTN: Needed IVF support while taking inadequate PO.  - diuresis per cardiology, appreciate assistance - Metoprolol on hold due to soft BP per cardiology - Soft BP limiting other GDMT at this time. Holding home BP meds.  - Coronary calcifications noted on scans, though deferring ischemic evaluation at this time.   Acute hypoxic respiratory failure: Suspect pulmonary edema worsening due to intraoperative fluids, though had increased cough prior. WBC stable/normal, afebrile. - Check CXR (poor inspiratory effort, no focal infiltrate) - Provide supplemental O2 to maintain SpO2 >  89% with normal WOB   Acute right brachial/axillary DVT, acute right cephalic and left cephalic SVT: In the setting of malignancy, remains hypercoagulable.  -  continue lovenox - monitor Hb on anticoagulation   Persistent AFib with RVR:  - CHA2DS2-VASc score is 5. Anticoagulation as above - Rates improved with digoxin. BP normalizing over past 24 hours. - Continue amiodarone, converted IV > PO 12/22. - appreciate cardiology recommendations, dig/amio per cards - amiodarone 200 mg BID x2 weeks, then daily, dig prn for rate control, not maintenance.  Metoprolol, uptitrate as tolerated.   Symptomatic iron deficiency, acute on chronic blood loss anemia: Ferritin is 7, iron 15, 3% sat, TIBC up to 535. Suspect thrombocytosis is reactive to this. s/p 1u PRBC 12/22. - IV iron given, stopped oral iron with dark stools complicating ability to discern GI bleeding. Stools now Mallen. - Hgb fluctuating, will follow    Vitamin B12 deficiency: Level is 166. - IM supplemented daily x7 days, now weekly   HLD: CK low.  - Statin   Stage IIIa CKD: Based on available CrCl values.  - Avoid nephrotoxins as able.  - CrCl continues improvement   Obesity: Body mass index is 39.11 kg/m.    Hypokalemia: At goal.   Central line in place, R IJ 12/24    DVT prophylaxis: lovenox Code Status: full code Family Communication: husband, daughter  Disposition:   Status is: Inpatient Remains inpatient appropriate because: need for post op care   Consultants:  Cardiology Surgery GI  Procedures:  12/26 LAPAROSCOPIC HAND ASSISTED RIGHT COLECTOMY   12/23 Summary:    Right:  Findings consistent with acute deep vein thrombosis involving the right  axillary  vein and right brachial veins. Findings consistent with acute superficial  vein  thrombosis involving the right basilic vein.    Left:  Findings consistent with acute superficial vein thrombosis involving the  left  cephalic vein.   EGD - Preparation of the colon was fair, but adequate for the purposes of this study. - Malignant completely obstructing tumor in the cecum and at the ileocecal valve.  Biopsied. Tattooed. - Two 4 to 5 mm polyps in the descending colon and in the transverse colon, removed with Natasha Dickson cold snare. Resected and retrieved. - One 8 mm polyp in the sigmoid colon, removed with Natasha Dickson cold snare. Resected and retrieved. - Diverticulosis in the sigmoid colon. - Non-bleeding internal hemorrhoids. - Stool in the sigmoid colon and in the descending colon. recommendation - Return patient to hospital ward for ongoing care. - Clear liquid diet today pending surgical evaluation. If no plan for inpatient surgery, will advance diet. - Continue present medications. - Await pathology results. - Consult inpatient General Surgery - Consult inpatient General Surgery/Colorectal Surgery today. - CT abdomen/pelvis for staging. Completed CT chest on 05/27/2022. - CEA. - Ok to resume heparin gtt. - IV iron on this admission. - Continue B12 and folate. - Continue serial CBC checks.  EGD - Normal esophagus. - 3 cm hiatal hernia. - Gastritis. Biopsied. - Normal mucosa was found in the gastric fundus and in the gastric body. Biopsied. - Natasha Dickson few gastric polyps. - Normal examined duodenum. Biopsied. Impression: - Perform Natasha Dickson colonoscopy today. - Will follow-up on pathology results. - Additional accommodations pending colonoscopy findings.  Antimicrobials:  Anti-infectives (From admission, onward)    Start     Dose/Rate Route Frequency Ordered Stop   06/07/22 2200  oseltamivir (TAMIFLU) capsule 30 mg       See Hyperspace for  full Linked Orders Report.   30 mg Oral 2 times daily 06/07/22 1455 06/12/22 0959   06/07/22 1545  oseltamivir (TAMIFLU) capsule 75 mg       See Hyperspace for full Linked Orders Report.   75 mg Oral  Once 06/07/22 1455 06/07/22 1616   06/04/22 0600  cefoTEtan (CEFOTAN) 2 g in sodium chloride 0.9 % 100 mL IVPB  Status:  Discontinued        2 g 200 mL/hr over 30 Minutes Intravenous On call to O.R. 06/03/22 9833 06/04/22 0956   06/03/22 1400  neomycin  (MYCIFRADIN) tablet 1,000 mg       See Hyperspace for full Linked Orders Report.   1,000 mg Oral 3 times per day 06/03/22 0841 06/03/22 2159   06/03/22 1400  metroNIDAZOLE (FLAGYL) tablet 1,000 mg       See Hyperspace for full Linked Orders Report.   1,000 mg Oral 3 times per day 06/03/22 0841 06/03/22 2116   05/31/22 0600  cefoTEtan (CEFOTAN) 2 g in sodium chloride 0.9 % 100 mL IVPB        2 g 200 mL/hr over 30 Minutes Intravenous On call to O.R. 05/30/22 1716 06/01/22 0559   05/30/22 2200  neomycin (MYCIFRADIN) tablet 1,000 mg       See Hyperspace for full Linked Orders Report.   1,000 mg Oral 3 times per day 05/30/22 1716 05/30/22 2238   05/30/22 2200  metroNIDAZOLE (FLAGYL) tablet 1,000 mg       See Hyperspace for full Linked Orders Report.   1,000 mg Oral 3 times per day 05/30/22 1716 05/30/22 2128       Subjective: No new complaints Family concerned about cognitive status, suspect possible stroke  Objective: Vitals:   06/07/22 0426 06/07/22 0712 06/07/22 1500 06/07/22 1617  BP: (!) 141/63 (!) 156/70 132/60 (!) 118/59  Pulse: 87 82 85 86  Resp: '18 17 20 15  '$ Temp: 98.2 F (36.8 C) 98.4 F (36.9 C) 98.7 F (37.1 C) 98.9 F (37.2 C)  TempSrc: Axillary Axillary Axillary Axillary  SpO2: 95% 96% 90% 99%  Weight: 92.3 kg     Height:        Intake/Output Summary (Last 24 hours) at 06/07/2022 1819 Last data filed at 06/07/2022 0300 Gross per 24 hour  Intake --  Output 850 ml  Net -850 ml   Filed Weights   06/05/22 0341 06/06/22 0326 06/07/22 0426  Weight: 94.3 kg 95.7 kg 92.3 kg    Examination:  General: No acute distress. Cardiovascular: irregularly irregular  Lungs: unlabored Abdomen: Soft, nontender, nondistended  Neurological: distractible, inattentive, CN 2-12 intact (no facial droop, her bottom lip falling open, but no asymmetry), symmetric strength upper and lower, intact FNF.  She's delirious.  Extremities: No clubbing or cyanosis. No edema.  Data  Reviewed: I have personally reviewed following labs and imaging studies  CBC: Recent Labs  Lab 06/03/22 0452 06/04/22 0232 06/05/22 0357 06/05/22 2030 06/06/22 0443 06/07/22 0321  WBC 9.6 8.1 11.4*  --  10.7* 6.8  NEUTROABS  --   --   --   --   --  4.9  HGB 8.4* 9.0* 8.1* 8.4* 8.5* 8.9*  HCT 27.5* 30.1* 26.1* 26.8* 28.5* 30.5*  MCV 81.8 83.1 81.1  --  82.4 83.3  PLT 222 222 221  --  245 825    Basic Metabolic Panel: Recent Labs  Lab 06/01/22 0015 06/02/22 0539 06/03/22 7673 06/04/22 0232 06/05/22 0357 06/06/22 0443 06/07/22 0321  NA 139   < > 136 136 135 136 140  K 3.4*   < > 4.0 4.4 4.2 3.5 3.3*  CL 113*   < > 109 107 106 103 102  CO2 21*   < > '22 22 23 26 27  '$ GLUCOSE 99   < > 93 83 110* 94 90  BUN 8   < > 6* '8 9 11 10  '$ CREATININE 1.00   < > 0.90 0.89 0.91 0.95 1.02*  CALCIUM 8.1*   < > 7.9* 8.0* 7.9* 7.9* 8.0*  MG 2.1  --   --   --   --   --  2.1  PHOS  --   --   --   --   --   --  3.2   < > = values in this interval not displayed.    GFR: Estimated Creatinine Clearance: 48.6 mL/min (Jaymi Tinner) (by C-G formula based on SCr of 1.02 mg/dL (H)).  Liver Function Tests: Recent Labs  Lab 06/07/22 0321  AST 31  ALT 17  ALKPHOS 50  BILITOT 0.6  PROT 5.6*  ALBUMIN 2.5*    CBG: No results for input(s): "GLUCAP" in the last 168 hours.   Recent Results (from the past 240 hour(s))  Surgical pcr screen     Status: Abnormal   Collection Time: 06/03/22  8:12 PM   Specimen: Nasal Mucosa; Nasal Swab  Result Value Ref Range Status   MRSA, PCR NEGATIVE NEGATIVE Final   Staphylococcus aureus POSITIVE (Natasha Dickson) NEGATIVE Final    Comment: (NOTE) The Xpert SA Assay (FDA approved for NASAL specimens in patients 69 years of age and older), is one component of Natasha Dickson comprehensive surveillance program. It is not intended to diagnose infection nor to guide or monitor treatment. Performed at [facility], [place] [address] [place], [place] [address]   Resp panel by RT-PCR (RSV, Flu  Natasha Dickson&B, Covid) Anterior Nasal Swab     Status: Abnormal   Collection Time: 06/07/22 10:09 AM   Specimen: Anterior Nasal Swab  Result Value Ref Range Status   SARS Coronavirus 2 by RT PCR NEGATIVE NEGATIVE Final    Comment: (NOTE) SARS-CoV-2 target nucleic acids are NOT DETECTED.  The SARS-CoV-2 RNA is generally detectable in upper respiratory specimens during the acute phase of infection. The lowest concentration of SARS-CoV-2 viral copies this assay can detect is 138 copies/mL. Natasha Dickson negative result does not preclude SARS-Cov-2 infection and should not be used as the sole basis for treatment or other patient management decisions. Natasha Dickson negative result may occur with  improper specimen collection/handling, submission of specimen other than nasopharyngeal swab, presence of viral mutation(s) within the areas targeted by this assay, and inadequate number of viral copies(<138 copies/mL). Natasha Dickson negative result must be combined with clinical observations, patient history, and epidemiological information. The expected result is Negative.  Fact Sheet for Patients:  EntrepreneurPulse.com.au  Fact Sheet for Healthcare Providers:  IncredibleEmployment.be  This test is no t yet approved or cleared by the [place] FDA and  has been authorized for detection and/or diagnosis of SARS-CoV-2 by FDA under an Emergency Use Authorization (EUA). This EUA will remain  in effect (meaning this test can be used) for the duration of the COVID-19 declaration under Section 564(b)(1) of the Act, 21 U.S.C.section 360bbb-3(b)(1), unless the authorization is terminated  or revoked sooner.       Influenza Natasha Dickson by PCR NEGATIVE NEGATIVE Final   Influenza B by PCR POSITIVE (Natasha Dickson) NEGATIVE Final  Comment: (NOTE) The Xpert Xpress SARS-CoV-2/FLU/RSV plus assay is intended as an aid in the diagnosis of influenza from Nasopharyngeal swab specimens and should not be used as Natasha Dickson sole basis for  treatment. Nasal washings and aspirates are unacceptable for Xpert Xpress SARS-CoV-2/FLU/RSV testing.  Fact Sheet for Patients: EntrepreneurPulse.com.au  Fact Sheet for Healthcare Providers: IncredibleEmployment.be  This test is not yet approved or cleared by the [place] FDA and has been authorized for detection and/or diagnosis of SARS-CoV-2 by FDA under an Emergency Use Authorization (EUA). This EUA will remain in effect (meaning this test can be used) for the duration of the COVID-19 declaration under Section 564(b)(1) of the Act, 21 U.S.C. section 360bbb-3(b)(1), unless the authorization is terminated or revoked.     Resp Syncytial Virus by PCR NEGATIVE NEGATIVE Final    Comment: (NOTE) Fact Sheet for Patients: EntrepreneurPulse.com.au  Fact Sheet for Healthcare Providers: IncredibleEmployment.be  This test is not yet approved or cleared by the [place] FDA and has been authorized for detection and/or diagnosis of SARS-CoV-2 by FDA under an Emergency Use Authorization (EUA). This EUA will remain in effect (meaning this test can be used) for the duration of the COVID-19 declaration under Section 564(b)(1) of the Act, 21 U.S.C. section 360bbb-3(b)(1), unless the authorization is terminated or revoked.  Performed at [facility], [place] [address] [place], [place] [address]          Radiology Studies: No results found.      Scheduled Meds:  acetaminophen  650 mg Oral Q6H   alteplase  2 mg Intracatheter Once   alteplase  2 mg Intracatheter Once   alvimopan  12 mg Oral BID   amiodarone  200 mg Oral BID   atorvastatin  10 mg Oral Daily   Chlorhexidine Gluconate Cloth  6 each Topical Daily   [START ON 06/09/2022] cyanocobalamin  1,000 mcg Intramuscular Q Sun   docusate sodium  100 mg Oral BID   enoxaparin (LOVENOX) injection  90 mg Subcutaneous Q12H   feeding supplement  237  mL Oral TID BM   folic acid  1 mg Oral Daily   [START ON 06/08/2022] furosemide  20 mg Oral Daily   gabapentin  300 mg Oral TID   metoprolol succinate  25 mg Oral Daily   mupirocin ointment  1 Application Nasal BID   oseltamivir  30 mg Oral BID   pantoprazole (PROTONIX) IV  40 mg Intravenous QHS   sodium chloride flush  10-40 mL Intracatheter Q12H   Continuous Infusions:  methocarbamol (ROBAXIN) IV       LOS: 11 days    Time spent: over 30 min    Fayrene Helper, MD [facility]   To contact the attending provider between 7A-7P or the covering provider during after hours 7P-7A, please log into the web site [email or web address] and access using universal [place] password for that web site. If you do not have the password, please call the hospital operator.  06/07/2022, 6:19 PM

## 2022-06-07 NOTE — Progress Notes (Signed)
[place] for lovenox Indication: DVT  No Known Allergies  Patient Measurements: Height: '5\' 1"'$  (154.9 cm) Weight: 92.3 kg (203 lb 7.8 oz) IBW/kg (Calculated) : 47.8 Heparin Dosing Weight: 69.7 kg  Vital Signs: Temp: 98.4 F (36.9 C) (12/29 0712) Temp Source: Axillary (12/29 0712) BP: 156/70 (12/29 0712) Pulse Rate: 82 (12/29 0712)  Labs: Recent Labs    06/05/22 0357 06/05/22 2030 06/06/22 0443 06/07/22 0321  HGB 8.1* 8.4* 8.5* 8.9*  HCT 26.1* 26.8* 28.5* 30.5*  PLT 221  --  245 266  CREATININE 0.91  --  0.95 1.02*     Estimated Creatinine Clearance: 48.6 mL/min (A) (by C-G formula based on SCr of 1.02 mg/dL (H)).   Medical History: Past Medical History:  Diagnosis Date   Breast cancer ([place]) 1999   Stage I in left breast   Chronic combined systolic and diastolic CHF (congestive heart failure) ([place])    a. 02/2013 Echo: EF 30-35%, diff HK, biatrial enlargement. b. 2D echo 07/2014 showed mildly dilated LV, EF 45-50%, severely dilated left atrium and moderately dilated right atrium.   CKD (chronic kidney disease), stage II    Closed fracture of unspecified part of neck of femur 2011   Right.  Soreness when standing. (Dr. Durward Fortes)   Nonischemic cardiomyopathy [facility])    a. 02/2013 [facility] CL: No ischemia, prob attenuation vs scar, EF 40%-->Med Rx.   Persistent atrial fibrillation ([place])    a. 02/2013: s/p TEE and attempted cardioversion x 2-->failed-->rate controlled with bb/digoxin, Xarelto initiated.   Assessment: 76 year old female presenting with afib w/ RVR - on Xarelto PTA. While admitted was started on heparin infusion. Heparin held in s/o progressive anemia. After PICC line placement, patient developed DVT, pharmacy has been consulted to dose enoxaparin.   Pt s/p laparoscopic colectomy 12/26 c/b postop ileus. Enoxaparin resumed, continue this until postop ileus resolves.  Goal of Therapy:  Monitor platelets by anticoagulation  protocol: Yes   Plan:  Enoxaparin '90mg'$  SQ BID Check q72h CBC  Arrie Senate, PharmD, [place], [facility] Clinical Pharmacist [phone] Please check AMION for all [place] numbers 06/07/2022

## 2022-06-07 NOTE — Evaluation (Signed)
Clinical/Bedside Swallow Evaluation Patient Details  Name: Natasha Dickson MRN: [id number] Date of Birth: 12/05/1945  Today's Date: 06/07/2022 Time: SLP Start Time (ACUTE ONLY): 1106 SLP Stop Time (ACUTE ONLY): 1130 SLP Time Calculation (min) (ACUTE ONLY): 24 min  Past Medical History:  Past Medical History:  Diagnosis Date   Breast cancer ([place]) 1999   Stage I in left breast   Chronic combined systolic and diastolic CHF (congestive heart failure) ([place])    a. 02/2013 Echo: EF 30-35%, diff HK, biatrial enlargement. b. 2D echo 07/2014 showed mildly dilated LV, EF 45-50%, severely dilated left atrium and moderately dilated right atrium.   CKD (chronic kidney disease), stage II    Closed fracture of unspecified part of neck of femur 2011   Right.  Soreness when standing. (Dr. Durward Fortes)   Nonischemic cardiomyopathy [facility])    a. 02/2013 [facility] CL: No ischemia, prob attenuation vs scar, EF 40%-->Med Rx.   Persistent atrial fibrillation ([place])    a. 02/2013: s/p TEE and attempted cardioversion x 2-->failed-->rate controlled with bb/digoxin, Xarelto initiated.   Past Surgical History:  Past Surgical History:  Procedure Laterality Date   BIOPSY  05/29/2022   Procedure: BIOPSY;  Surgeon: Lavena Bullion, DO;  Location: [place];  Service: Gastroenterology;;   CARDIOVERSION N/A 02/18/2013   Procedure: CARDIOVERSION;  Surgeon: Jolaine Artist, MD;  Location: [facility] ENDOSCOPY;  Service: Cardiovascular;  Laterality: N/A;   CARDIOVERSION N/A 02/22/2013   Procedure: CARDIOVERSION;  Surgeon: Fay Records, MD;  Location: [place];  Service: Cardiovascular;  Laterality: N/A;   CHOLECYSTECTOMY Right 06/04/2022   Procedure: LAPAROSCOPIC HAND ASSISTED RIGHT COLECTOMY;  Surgeon: Felicie Morn, MD;  Location: [place];  Service: General;  Laterality: Right;   COLONOSCOPY WITH PROPOFOL N/A 05/29/2022   Procedure: COLONOSCOPY WITH PROPOFOL;  Surgeon: Lavena Bullion, DO;  Location: [place];   Service: Gastroenterology;  Laterality: N/A;   ESOPHAGOGASTRODUODENOSCOPY (EGD) WITH PROPOFOL N/A 05/29/2022   Procedure: ESOPHAGOGASTRODUODENOSCOPY (EGD) WITH PROPOFOL;  Surgeon: Lavena Bullion, DO;  Location: [place];  Service: Gastroenterology;  Laterality: N/A;   IR FLUORO GUIDE CV LINE RIGHT  06/02/2022   IR US GUIDE VASC ACCESS RIGHT  06/02/2022   MASTECTOMY  1999   Left breast with saline implant.   PARTIAL HIP ARTHROPLASTY Right    POLYPECTOMY  05/29/2022   Procedure: POLYPECTOMY;  Surgeon: Lavena Bullion, DO;  Location: [place] ENDOSCOPY;  Service: Gastroenterology;;   SUBMUCOSAL TATTOO INJECTION  05/29/2022   Procedure: SUBMUCOSAL TATTOO INJECTION;  Surgeon: Lavena Bullion, DO;  Location: [place];  Service: Gastroenterology;;   TEE WITHOUT CARDIOVERSION N/A 02/18/2013   Procedure: TRANSESOPHAGEAL ECHOCARDIOGRAM (TEE);  Surgeon: Jolaine Artist, MD;  Location: [facility] ENDOSCOPY;  Service: Cardiovascular;  Laterality: N/A;   tubal ligation     TUBAL LIGATION     HPI:  76 y.o. female who presented to the ED 12/18 with hypotension and weakness. During work up found to have malignant completely obstructing tumor at cecum; dx invasive adenocarcinoma. Underwent hemicolectomy 12/26; post-op ileus; 12/28 notes indicate vomiting after swallowing. 12/29 diet advanced from clears to soft diet with pudding thick liquids.  PMH: A. Fib with RVR on Xarelto (last dose 12/18, currently on heparin gtt), CHF (30-35% 2014, now 55-60% in 04/2022), HTN, HLD, CKD2    Assessment / Plan / Recommendation  Clinical Impression  Pt presents with functional swallowing.  She was alert and participatory. Husband and daughter at bedside.Daughter reports some concerns for increased drooling and  lower lip weakness.  Oral motor assessment revealed good bilateral movements; no obvious cranial nerve weaknesses. Pt did have difficulty following commands with delayed processing noted.  She accepted bites of  solid food, puree, and drank thin liquids with no s/s of dysphagia nor aspiration.  Suspect encephalopathy is contributing to the symptoms that Santiago Glad, pt's daughter, is describing. Recommend continuing soft diet as recommended by surgery; allow thin liquids. Meds per pt's preference. No SLP is needed. D/W RN. Will sign off. SLP Visit Diagnosis: Dysphagia, unspecified (R13.10)    Aspiration Risk  No limitations    Diet Recommendation   Soft per surgery; thin liquids  Medication Administration: Whole meds with liquid    Other  Recommendations   N/a   Recommendations for follow up therapy are one component of a multi-disciplinary discharge planning process, led by the attending physician.  Recommendations may be updated based on patient status, additional functional criteria and insurance authorization.  Follow up Recommendations   No SLP f/u       Swallow Study   General Date of Onset: 05/27/22 HPI: 76 y.o. female who presented to the ED 12/18 with hypotension and weakness. During work up found to have malignant completely obstructing tumor at cecum; dx invasive adenocarcinoma. Underwent hemicolectomy 12/26; post-op ileus; 12/28 notes indicate vomiting after swallowing. 12/29 diet advanced from clears to soft diet with pudding thick liquids.  PMH: A. Fib with RVR on Xarelto (last dose 12/18, currently on heparin gtt), CHF (30-35% 2014, now 55-60% in 04/2022), HTN, HLD, CKD2 Type of Study: Bedside Swallow Evaluation Previous Swallow Assessment: no Diet Prior to this Study: Dysphagia 3 (soft);Pudding-thick liquids Temperature Spikes Noted: No Respiratory Status: Room air History of Recent Intubation: No Behavior/Cognition: Alert;Cooperative Oral Cavity Assessment: Within Functional Limits Oral Care Completed by SLP: Recent completion by staff Oral Cavity - Dentition: Adequate natural dentition Vision: Functional for self-feeding Self-Feeding Abilities: Able to feed self Patient  Positioning: Upright in bed Baseline Vocal Quality: Normal Volitional Cough: Strong Volitional Swallow: Able to elicit    Oral/Motor/Sensory Function Overall Oral Motor/Sensory Function: Within functional limits   Ice Chips Ice chips: Not tested   Thin Liquid Thin Liquid: Within functional limits Presentation: Cup;Straw    Nectar Thick Nectar Thick Liquid: Not tested   Honey Thick Honey Thick Liquid: Not tested   Puree Puree: Within functional limits   Solid     Solid: Within functional limits      Juan Quam Laurice 06/07/2022,11:54 AM  Estill Bamberg L. Tivis Ringer, MA CCC/SLP Clinical Specialist - [place] Office number [phone]

## 2022-06-07 NOTE — Progress Notes (Signed)
PT Cancellation Note  Patient Details Name: Natasha Dickson MRN: [id number] DOB: 07-13-1945   Cancelled Treatment:    Reason Eval/Treat Not Completed: Patient's level of consciousness.  Lethargic when PT attempted to see.  Retry another time.   Ramond Dial 06/07/2022, 3:02 PM  Mee Hives, PT PhD Acute Rehab Dept. Number: [place] and [place]

## 2022-06-08 DIAGNOSIS — I4891 Unspecified atrial fibrillation: Secondary | ICD-10-CM | POA: Diagnosis not present

## 2022-06-08 LAB — MAGNESIUM: Magnesium: 2.4 mg/dL (ref 1.7–2.4)

## 2022-06-08 LAB — COMPREHENSIVE METABOLIC PANEL
ALT: 14 U/L (ref 0–44)
AST: 30 U/L (ref 15–41)
Albumin: 2.3 g/dL — ABNORMAL LOW (ref 3.5–5.0)
Alkaline Phosphatase: 45 U/L (ref 38–126)
Anion gap: 9 (ref 5–15)
BUN: 12 mg/dL (ref 8–23)
CO2: 28 mmol/L (ref 22–32)
Calcium: 7.8 mg/dL — ABNORMAL LOW (ref 8.9–10.3)
Chloride: 103 mmol/L (ref 98–111)
Creatinine, Ser: 1.06 mg/dL — ABNORMAL HIGH (ref 0.44–1.00)
GFR, Estimated: 54 mL/min — ABNORMAL LOW (ref >60)
Glucose, Bld: 85 mg/dL (ref 70–99)
Potassium: 3.5 mmol/L (ref 3.5–5.1)
Sodium: 140 mmol/L (ref 135–145)
Total Bilirubin: 0.6 mg/dL (ref 0.3–1.2)
Total Protein: 5.2 g/dL — ABNORMAL LOW (ref 6.5–8.1)

## 2022-06-08 LAB — CBC WITH DIFFERENTIAL/PLATELET
Abs Immature Granulocytes: 0.26 10*3/uL — ABNORMAL HIGH (ref 0.00–0.07)
Basophils Absolute: 0 10*3/uL (ref 0.0–0.1)
Basophils Relative: 0 %
Eosinophils Absolute: 0.1 10*3/uL (ref 0.0–0.5)
Eosinophils Relative: 1 %
HCT: 28.8 % — ABNORMAL LOW (ref 36.0–46.0)
Hemoglobin: 8.2 g/dL — ABNORMAL LOW (ref 12.0–15.0)
Immature Granulocytes: 4 %
Lymphocytes Relative: 18 %
Lymphs Abs: 1.2 10*3/uL (ref 0.7–4.0)
MCH: 24.6 pg — ABNORMAL LOW (ref 26.0–34.0)
MCHC: 28.5 g/dL — ABNORMAL LOW (ref 30.0–36.0)
MCV: 86.5 fL (ref 80.0–100.0)
Monocytes Absolute: 0.5 10*3/uL (ref 0.1–1.0)
Monocytes Relative: 8 %
Neutro Abs: 4.6 10*3/uL (ref 1.7–7.7)
Neutrophils Relative %: 69 %
Platelets: 261 10*3/uL (ref 150–400)
RBC: 3.33 MIL/uL — ABNORMAL LOW (ref 3.87–5.11)
RDW: 22.1 % — ABNORMAL HIGH (ref 11.5–15.5)
WBC: 6.6 10*3/uL (ref 4.0–10.5)
nRBC: 1.4 % — ABNORMAL HIGH (ref 0.0–0.2)

## 2022-06-08 LAB — DIGOXIN LEVEL: Digoxin Level: 2.2 ng/mL — ABNORMAL HIGH (ref 0.8–2.0)

## 2022-06-08 LAB — PHOSPHORUS: Phosphorus: 3.9 mg/dL (ref 2.5–4.6)

## 2022-06-08 MED ORDER — RIVAROXABAN 15 MG PO TABS
15.0000 mg | ORAL_TABLET | Freq: Two times a day (BID) | ORAL | Status: DC
  Administered 2022-06-08 – 2022-06-12 (×8): 15 mg via ORAL
  Filled 2022-06-08 (×8): qty 1

## 2022-06-08 MED ORDER — POTASSIUM CHLORIDE CRYS ER 20 MEQ PO TBCR
40.0000 meq | EXTENDED_RELEASE_TABLET | Freq: Once | ORAL | Status: AC
  Administered 2022-06-08: 40 meq via ORAL
  Filled 2022-06-08: qty 2

## 2022-06-08 MED ORDER — LOPERAMIDE HCL 2 MG PO CAPS
2.0000 mg | ORAL_CAPSULE | ORAL | Status: DC | PRN
  Administered 2022-06-08 – 2022-06-09 (×2): 2 mg via ORAL
  Filled 2022-06-08 (×2): qty 1

## 2022-06-08 NOTE — Progress Notes (Signed)
Occupational Therapy Treatment Patient Details Name: Natasha Dickson MRN: [id number] DOB: April 05, 1946 Today's Date: 06/08/2022   History of present illness 76 y.o. female who presented to the ED 12/18 with hypotension and weakness. 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  Pt s/p laporoscopic R colectomy on 06/04/22.  PMH: A. Fib with RVR on Xarelto (last dose 12/18, currently on heparin gtt), CHF (30-35% 2014, now 55-60% in 04/2022), HTN, HLD, and CKD2.   OT comments  Keeli did not make functional progress this date, she was lethargic and requires moderate cues for attention, sequencing and safety throughout. Overall she required min A for bed mobility and simple transfers. Upon standing, pt reporting feeling "swimmy headed," LE BP not accurate in standing. Limited to BP commode transfer, and pt required max A for rear peri hygiene in standing. OT to continue to follow acutely. Due to very limited acute progress, d/c recommendation updated to SNF for safety and continued therapy.   Recommendations for follow up therapy are one component of a multi-disciplinary discharge planning process, led by the attending physician.  Recommendations may be updated based on patient status, additional functional criteria and insurance authorization.    Follow Up Recommendations  Skilled nursing-short term rehab (<3 hours/day) (likely to decline)     Assistance Recommended at Discharge Intermittent Supervision/Assistance  Patient can return home with the following  A little help with walking and/or transfers;A little help with bathing/dressing/bathroom;Assistance with cooking/housework;Assist for transportation;Help with stairs or ramp for entrance   Equipment Recommendations  BSC/3in1       Precautions / Restrictions Precautions Precautions: Fall Precaution Comments: watch BP Restrictions Weight Bearing Restrictions: No       Mobility Bed Mobility Overal  bed mobility: Needs Assistance Bed Mobility: Supine to Sit     Supine to sit: Min assist     General bed mobility comments: assist for trunk elevation    Transfers Overall transfer level: Needs assistance Equipment used: Rolling walker (2 wheels) Transfers: Sit to/from Stand, Bed to chair/wheelchair/BSC Sit to Stand: Min assist     Step pivot transfers: Min assist     General transfer comment: unsteady, one mild LOB. poor management of RW     Balance Overall balance assessment: Needs assistance Sitting-balance support: Feet supported Sitting balance-Leahy Scale: Fair     Standing balance support: Bilateral upper extremity supported, During functional activity, Single extremity supported Standing balance-Leahy Scale: Poor Standing balance comment: BUE support required                           ADL either performed or assessed with clinical judgement   ADL Overall ADL's : Needs assistance/impaired     Grooming: Wash/dry hands;Set up;Sitting                   Toilet Transfer: Minimal assistance;Rolling walker (2 wheels);BSC/3in1;Stand-pivot Toilet Transfer Details (indicate cue type and reason): SP only due to pt reports of feeling "swimmy headed" Toileting- Clothing Manipulation and Hygiene: Maximal assistance;Sit to/from stand Toileting - Clothing Manipulation Details (indicate cue type and reason): max A for rear peri hygeine in standing. pt unable to reach in sitting on BSC     Functional mobility during ADLs: Minimal assistance;Rolling walker (2 wheels) General ADL Comments: increased assist for attention, sequencing, safety and balance this date. Limited to SP to [facility]    Extremity/Trunk Assessment Upper Extremity Assessment Upper Extremity Assessment: Generalized weakness   Lower  Extremity Assessment Lower Extremity Assessment: Generalized weakness        Vision   Vision Assessment?: No apparent visual deficits   Perception  Perception Perception: Not tested   Praxis Praxis Praxis: Not tested    Cognition Arousal/Alertness: Awake/alert, Lethargic Behavior During Therapy: Flat affect Overall Cognitive Status: Within Functional Limits for tasks assessed                                 General Comments: flat affect, not very communicative this date. simple one word answers only. required cues for attention and sequencing              General Comments VSS on 2L O2    Pertinent Vitals/ Pain       Pain Assessment Pain Assessment: Faces Faces Pain Scale: Hurts little more Pain Location: abdomen and rear peri area Pain Descriptors / Indicators: Grimacing Pain Intervention(s): Limited activity within patient's tolerance, Monitored during session   Frequency  Min 2X/week        Progress Toward Goals  OT Goals(current goals can now be found in the care plan section)  Progress towards OT goals: Progressing toward goals  Acute Rehab OT Goals Patient Stated Goal: to go home OT Goal Formulation: With patient Time For Goal Achievement: 06/13/22 Potential to Achieve Goals: Good ADL Goals Pt Will Perform Grooming: with modified independence;standing Pt Will Perform Lower Body Dressing: with modified independence;sit to/from stand Pt Will Transfer to Toilet: with modified independence;ambulating Additional ADL Goal #1: Pt will complete at least 8 minutes of OOB functional tasks to demonstrate increased activity tolerance Additional ADL Goal #2: Pt will indep complete IADL medication management task  Plan Discharge plan needs to be updated       AM-PAC OT "6 Clicks" Daily Activity     Outcome Measure   Help from another person eating meals?: None Help from another person taking care of personal grooming?: A Little Help from another person toileting, which includes using toliet, bedpan, or urinal?: A Little Help from another person bathing (including washing, rinsing, drying)?: A  Little Help from another person to put on and taking off regular upper body clothing?: None Help from another person to put on and taking off regular lower body clothing?: A Lot 6 Click Score: 19    End of Session Equipment Utilized During Treatment: Rolling walker (2 wheels)  OT Visit Diagnosis: Unsteadiness on feet (R26.81);Other abnormalities of gait and mobility (R26.89);Muscle weakness (generalized) (M62.81);Pain   Activity Tolerance Patient tolerated treatment well   Patient Left in bed;with call bell/phone within reach;with family/visitor present   Nurse Communication Mobility status        Time: 4801-6553 OT Time Calculation (min): 23 min  Charges: OT General Charges $OT Visit: 1 Visit OT Treatments $Self Care/Home Management : 23-37 mins    Elliot Cousin 06/08/2022, 3:13 PM

## 2022-06-08 NOTE — Progress Notes (Signed)
Rounding Note    Patient Name: Natasha Dickson Date of Encounter: 06/08/2022  [place] Cardiologist: Freada Bergeron, MD   Subjective   Resting comfortably, no complaints  Inpatient Medications    Scheduled Meds:  acetaminophen  650 mg Oral Q6H   alteplase  2 mg Intracatheter Once   alteplase  2 mg Intracatheter Once   alvimopan  12 mg Oral BID   amiodarone  200 mg Oral BID   atorvastatin  10 mg Oral Daily   Chlorhexidine Gluconate Cloth  6 each Topical Daily   [START ON 06/09/2022] cyanocobalamin  1,000 mcg Intramuscular Q Sun   docusate sodium  100 mg Oral BID   enoxaparin (LOVENOX) injection  90 mg Subcutaneous Q12H   feeding supplement  237 mL Oral TID BM   folic acid  1 mg Oral Daily   furosemide  20 mg Oral Daily   gabapentin  300 mg Oral TID   metoprolol succinate  25 mg Oral Daily   mupirocin ointment  1 Application Nasal BID   oseltamivir  30 mg Oral BID   pantoprazole (PROTONIX) IV  40 mg Intravenous QHS   sodium chloride flush  10-40 mL Intracatheter Q12H   Continuous Infusions:  methocarbamol (ROBAXIN) IV     PRN Meds: acetaminophen **OR** acetaminophen, guaiFENesin-dextromethorphan, HYDROmorphone (DILAUDID) injection, methocarbamol (ROBAXIN) IV, metoprolol tartrate, ondansetron (ZOFRAN) IV, mouth rinse, oxyCODONE, oxyCODONE, prochlorperazine, simethicone, sodium chloride flush   Vital Signs    Vitals:   06/07/22 1500 06/07/22 1617 06/08/22 0011 06/08/22 0351  BP: 132/60 (!) 118/59 (!) 110/58 (!) 91/55  Pulse: 85 86 81 82  Resp: '20 15 16 19  '$ Temp: 98.7 F (37.1 C) 98.9 F (37.2 C) 98.4 F (36.9 C) 97.9 F (36.6 C)  TempSrc: Axillary Axillary Axillary Axillary  SpO2: 90% 99% 99% 98%  Weight:    91.8 kg  Height:       No intake or output data in the 24 hours ending 06/08/22 0759    06/08/2022    3:51 AM 06/07/2022    4:26 AM 06/06/2022    3:26 AM  Last 3 Weights  Weight (lbs) 202 lb 6.1 oz 203 lb 7.8 oz 210 lb 15.7 oz   Weight (kg) 91.8 kg 92.3 kg 95.7 kg      Telemetry    Atrial fibrillation heart rates around 60-80- Personally Reviewed  ECG    No new- Personally Reviewed  Physical Exam   GEN: No acute distress.  Resting comfortably Cardiac: Irregularly irregular Respiratory: Normal respiratory effort GI: non-distended  MS: Trace edema; No deformity.   Labs    High Sensitivity Troponin:   Recent Labs  Lab 05/27/22 1143 05/27/22 1809  TROPONINIHS 5 5     Chemistry Recent Labs  Lab 06/06/22 0443 06/07/22 0321 06/08/22 0450  NA 136 140 140  K 3.5 3.3* 3.5  CL 103 102 103  CO2 '26 27 28  '$ GLUCOSE 94 90 85  BUN '11 10 12  '$ CREATININE 0.95 1.02* 1.06*  CALCIUM 7.9* 8.0* 7.8*  MG  --  2.1 2.4  PROT  --  5.6* 5.2*  ALBUMIN  --  2.5* 2.3*  AST  --  31 30  ALT  --  17 14  ALKPHOS  --  50 45  BILITOT  --  0.6 0.6  GFRNONAA >60 57* 54*  ANIONGAP '7 11 9    '$ Lipids No results for input(s): "CHOL", "TRIG", "HDL", "LABVLDL", "LDLCALC", "CHOLHDL" in the  last 168 hours.  Hematology Recent Labs  Lab 06/06/22 0443 06/07/22 0321 06/08/22 0450  WBC 10.7* 6.8 6.6  RBC 3.46* 3.66* 3.33*  HGB 8.5* 8.9* 8.2*  HCT 28.5* 30.5* 28.8*  MCV 82.4 83.3 86.5  MCH 24.6* 24.3* 24.6*  MCHC 29.8* 29.2* 28.5*  RDW 21.3* 21.7* 22.1*  PLT 245 266 261   Thyroid No results for input(s): "TSH", "FREET4" in the last 168 hours.  BNP Recent Labs  Lab 06/01/22 1030  BNP 313.1*    DDimer No results for input(s): "DDIMER" in the last 168 hours.   Radiology    MR BRAIN WO CONTRAST  Result Date: 06/08/2022 CLINICAL DATA:  Altered mental status EXAM: MRI HEAD WITHOUT CONTRAST TECHNIQUE: Multiplanar, multiecho pulse sequences of the brain and surrounding structures were obtained without intravenous contrast. COMPARISON:  None Available. FINDINGS: Brain: No acute infarction, hemorrhage, hydrocephalus, extra-axial collection or mass lesion. No chronic microhemorrhage. There is multifocal hyperintense  T2-weighted signal within the periventricular and deep white matter. Dilated perivascular space at the left lentiform nucleus. Mild generalized volume loss. The midline structures are normal. Vascular: Normal flow voids. Skull and upper cervical spine: Normal bone marrow signal. Sinuses/Orbits: Bilateral mastoid fluid. Nasopharynx is clear. Paranasal sinuses are normal. Normal orbits. Other: None IMPRESSION: 1. No acute intracranial abnormality. 2. Findings of chronic microvascular ischemia and generalized volume loss. 3. Bilateral mastoid fluid. Electronically Signed   By: Ulyses Jarred M.D.   On: 06/08/2022 00:44     Patient Profile     76 y.o. female with persistent atrial fibrillation rate control strategy obstructing mass at the cecum post laparoscopic surgery and colectomy on 06/04/2022 on amiodarone, Xarelto with combined systolic and diastolic heart failure EF from 30 to 35% in 2014 back to 60% recently.  Assessment & Plan    Chronic systolic and diastolic heart failure - EF has improved in 2023 back to 60%. - Goal-directed medical therapy has been held because of low blood pressures - Overall stable and euvolemic.  Received IV Lasix 20 mg on 12/29. - We will transition to oral Lasix 20 mg daily.  Orders are in.  Persistent atrial fibrillation - Currently on amiodarone load 200 mg twice a day.  Continue for total of 2 weeks last day on 06/15/2022 and then down to 200 mg daily.  Currently well rate controlled.  CHA2DS2-VASc 5.  BP was soft and therefore amiodarone was utilized for rates.  Also on digoxin as well.  Unfortunately digoxin in her serum was 2.2, elevated.  We will go ahead and discontinue due to concomitant use with amiodarone which can increase levels.  Chronic anticoagulation - Resume Xarelto when hemoglobin stable, and felt to be safe from postsurgical standpoint.  No barriers for discharge from cardiology perspective.  We will go ahead and sign off.    For questions or  updates, please contact [place] Please consult [email or web address] for contact info under        Signed, Candee Furbish, MD  06/08/2022, 7:59 AM

## 2022-06-08 NOTE — Progress Notes (Signed)
4 Days Post-Op   Subjective/Chief Complaint:  No significant change  Having BM  Pain controlled  Confused at times  Objective: Vital signs in last 24 hours: Temp:  [97.9 F (36.6 C)-98.9 F (37.2 C)] 97.9 F (36.6 C) (12/30 0351) Pulse Rate:  [81-86] 82 (12/30 0351) Resp:  [15-20] 19 (12/30 0351) BP: (91-132)/(55-60) 91/55 (12/30 0351) SpO2:  [90 %-99 %] 98 % (12/30 0351) Weight:  [91.8 kg] 91.8 kg (12/30 0351) Last BM Date : 06/07/22  Intake/Output from previous day: No intake/output data recorded. Intake/Output this shift: No intake/output data recorded.  Abd  CDI incision soft   Lab Results:  Recent Labs    06/07/22 0321 06/08/22 0450  WBC 6.8 6.6  HGB 8.9* 8.2*  HCT 30.5* 28.8*  PLT 266 261   BMET Recent Labs    06/07/22 0321 06/08/22 0450  NA 140 140  K 3.3* 3.5  CL 102 103  CO2 27 28  GLUCOSE 90 85  BUN 10 12  CREATININE 1.02* 1.06*  CALCIUM 8.0* 7.8*   PT/INR No results for input(s): "LABPROT", "INR" in the last 72 hours. ABG No results for input(s): "PHART", "HCO3" in the last 72 hours.  Invalid input(s): "PCO2", "PO2"  Studies/Results: MR BRAIN WO CONTRAST  Result Date: 06/08/2022 CLINICAL DATA:  Altered mental status EXAM: MRI HEAD WITHOUT CONTRAST TECHNIQUE: Multiplanar, multiecho pulse sequences of the brain and surrounding structures were obtained without intravenous contrast. COMPARISON:  None Available. FINDINGS: Brain: No acute infarction, hemorrhage, hydrocephalus, extra-axial collection or mass lesion. No chronic microhemorrhage. There is multifocal hyperintense T2-weighted signal within the periventricular and deep white matter. Dilated perivascular space at the left lentiform nucleus. Mild generalized volume loss. The midline structures are normal. Vascular: Normal flow voids. Skull and upper cervical spine: Normal bone marrow signal. Sinuses/Orbits: Bilateral mastoid fluid. Nasopharynx is clear. Paranasal sinuses are normal. Normal  orbits. Other: None IMPRESSION: 1. No acute intracranial abnormality. 2. Findings of chronic microvascular ischemia and generalized volume loss. 3. Bilateral mastoid fluid. Electronically Signed   By: Ulyses Jarred M.D.   On: 06/08/2022 00:44    Anti-infectives: Anti-infectives (From admission, onward)    Start     Dose/Rate Route Frequency Ordered Stop   06/07/22 2200  oseltamivir (TAMIFLU) capsule 30 mg       See Hyperspace for full Linked Orders Report.   30 mg Oral 2 times daily 06/07/22 1455 06/12/22 0959   06/07/22 1545  oseltamivir (TAMIFLU) capsule 75 mg       See Hyperspace for full Linked Orders Report.   75 mg Oral  Once 06/07/22 1455 06/07/22 1616   06/04/22 0600  cefoTEtan (CEFOTAN) 2 g in sodium chloride 0.9 % 100 mL IVPB  Status:  Discontinued        2 g 200 mL/hr over 30 Minutes Intravenous On call to O.R. 06/03/22 4193 06/04/22 0956   06/03/22 1400  neomycin (MYCIFRADIN) tablet 1,000 mg       See Hyperspace for full Linked Orders Report.   1,000 mg Oral 3 times per day 06/03/22 0841 06/03/22 2159   06/03/22 1400  metroNIDAZOLE (FLAGYL) tablet 1,000 mg       See Hyperspace for full Linked Orders Report.   1,000 mg Oral 3 times per day 06/03/22 0841 06/03/22 2116   05/31/22 0600  cefoTEtan (CEFOTAN) 2 g in sodium chloride 0.9 % 100 mL IVPB        2 g 200 mL/hr over 30 Minutes Intravenous On call  to O.R. 05/30/22 1716 06/01/22 0559   05/30/22 2200  neomycin (MYCIFRADIN) tablet 1,000 mg       See Hyperspace for full Linked Orders Report.   1,000 mg Oral 3 times per day 05/30/22 1716 05/30/22 2238   05/30/22 2200  metroNIDAZOLE (FLAGYL) tablet 1,000 mg       See Hyperspace for full Linked Orders Report.   1,000 mg Oral 3 times per day 05/30/22 1716 05/30/22 2128       Assessment/Plan: s/p Procedure(s): LAPAROSCOPIC HAND ASSISTED RIGHT COLECTOMY (Right) Tolerating diet Can transition to po meds including anticoagulation if needed  Will need continues PT  and might  benefit from rehab at some point once medial issues improve   LOS: 12 days    Turner Daniels MD  06/08/2022

## 2022-06-08 NOTE — Progress Notes (Signed)
PROGRESS NOTE    Natasha Dickson  WUX:[id number] DOB: August 09, 1945 DOA: 05/27/2022 PCP: Tonia Ghent, MD  Chief Complaint  Patient presents with   Weakness    Brief Narrative:  Natasha Dickson is Natasha Dickson 76 y.o. female with medical history significant of atrial fibrillation on xarelto, systolic HF (recovered EF), non ischemic cardiomyopathy, hypertension and multiple other medical issues here with progressive generalized weakness.  Found to have new anemia and + FOBT as well as AFib with RVR. She was admitted with cardiology consultations. GI took for EGD/colonoscopy 12/20 which showed Yeiden Frenkel "malignant completely obstructing tumor in the cecum and at the ileocecal valve," which has been found to be invasive adenocarcinoma. There were polyps in the transverse, descending, and sigmoid colon which were also sent to pathology ultimately ruling out for malignancy. CT abd/pelvis showed mesenteric LNs, no liver lesions. Rate control has been complicated by hypotension for which cardiology is consulted, maintaining marginal rate control with amiodarone with soft BP. She has received IV iron and 1u PRBCs 12/20 for progressive anemia and had heparin held due to this. Due to continued difficulty with venous access, PICC line placed, subsequently developed DVT of right brachial and axillary veins as well as SVT of bilateral cephalic veins for which therapeutic anticoagulation was reinitiated and PICC discontinued, right IJ inserted 12/24. Surgery performed hemicolectomy 12/26.    Assessment & Plan:   Principal Problem:   Atrial fibrillation with RVR (HCC) Active Problems:   B12 deficiency   Weakness   (HFpEF) heart failure with preserved ejection fraction (HCC)   Microcytic anemia   Fecal occult blood test positive   Colonic mass   Iron deficiency anemia due to chronic blood loss   Diverticulosis of colon without hemorrhage   Adenomatous polyp of transverse colon   Adenomatous polyp of descending colon    Adenomatous polyp of sigmoid colon   Gastritis and gastroduodenitis   Hiatal hernia  Invasive moderately differentiated adenocarcinoma of the cecum: Other polyps tested negative for malignancy. CT abd/pelvis staging scan showed subcentimeter mesenteric LNs and 4.5cm x 2.6cm thickening of cecum close to ileocecal junction. No hepatic abnormalities. CEA oddly only 3.6 > 4.1. Now s/p laparoscopic hand-assisted right colectomy by Dr. Thermon Leyland 12/26.  - Has multimodal pain regimen ordered - post op recs per surgery - soft diet per surgery  - Wound care per surgery.  - DC foley per postop routine - Follow up with oncology after discharge. - follow surgical pathology -> invasive colonic adenocarcinoma, will need to discuss with oncology (plan for Monday)   Influenza B Infection  - tamiflu  Delirium  Mild cognitive impairment:  - improved today - family concerned about possible stroke, I think this is less likely based on her exam, but will follow MRI brain (without acute stroke) - delirium precautions   Acute on chronic combined HFrEF with recovered EF, HTN: Needed IVF support while taking inadequate PO.  - diuresis per cardiology, appreciate assistance -> PO lasix at this time - Metoprolol - spironolactone on hold - Coronary calcifications noted on scans, though deferring ischemic evaluation at this time.   Acute hypoxic respiratory failure: Suspect pulmonary edema worsening due to intraoperative fluids, though had increased cough prior. WBC stable/normal, afebrile. - Check CXR (poor inspiratory effort, no focal infiltrate) - Provide supplemental O2 to maintain SpO2 >89% with normal WOB   Acute right brachial/axillary DVT, acute right cephalic and left cephalic SVT: In the setting of malignancy, remains hypercoagulable.  - continue lovenox ->  transition to xarelto - monitor Hb on anticoagulation   Persistent AFib with RVR:  - CHA2DS2-VASc score is 5. Anticoagulation as above -  Rates improved with digoxin. BP normalizing over past 24 hours. - Continue amiodarone, converted IV > PO 12/22. - appreciate cardiology recommendations, dig/amio per cards - amiodarone 200 mg BID x2 weeks (until 06/15/2022), then daily on 1/7.  Dig d/c'd.  Metoprolol, uptitrate as tolerated.   Symptomatic iron deficiency, acute on chronic blood loss anemia: Ferritin is 7, iron 15, 3% sat, TIBC up to 535. Suspect thrombocytosis is reactive to this. s/p 1u PRBC 12/22. - IV iron given, stopped oral iron with dark stools complicating ability to discern GI bleeding. Stools now Burcher. - Hgb fluctuating, will follow    Vitamin B12 deficiency: Level is 166. - IM supplemented daily x7 days, now weekly   HLD: CK low.  - Statin   Stage IIIa CKD: Based on available CrCl values.  - Avoid nephrotoxins as able.  - CrCl continues improvement   Obesity: Body mass index is 39.11 kg/m.    Hypokalemia: replace and follow  Central line in place, R IJ 12/24 (limited peripheral access due to UE DVT and SVT)    DVT prophylaxis: lovenox Code Status: full code Family Communication: husband, daughter  Disposition:   Status is: Inpatient Remains inpatient appropriate because: need for post op care   Consultants:  Cardiology Surgery GI  Procedures:  12/26 LAPAROSCOPIC HAND ASSISTED RIGHT COLECTOMY   12/23 Summary:    Right:  Findings consistent with acute deep vein thrombosis involving the right  axillary  vein and right brachial veins. Findings consistent with acute superficial  vein  thrombosis involving the right basilic vein.    Left:  Findings consistent with acute superficial vein thrombosis involving the  left  cephalic vein.   EGD - Preparation of the colon was fair, but adequate for the purposes of this study. - Malignant completely obstructing tumor in the cecum and at the ileocecal valve. Biopsied. Tattooed. - Two 4 to 5 mm polyps in the descending colon and in the  transverse colon, removed with Baleigh Rennaker cold snare. Resected and retrieved. - One 8 mm polyp in the sigmoid colon, removed with Kaamil Morefield cold snare. Resected and retrieved. - Diverticulosis in the sigmoid colon. - Non-bleeding internal hemorrhoids. - Stool in the sigmoid colon and in the descending colon. recommendation - Return patient to hospital ward for ongoing care. - Clear liquid diet today pending surgical evaluation. If no plan for inpatient surgery, will advance diet. - Continue present medications. - Await pathology results. - Consult inpatient General Surgery - Consult inpatient General Surgery/Colorectal Surgery today. - CT abdomen/pelvis for staging. Completed CT chest on 05/27/2022. - CEA. - Ok to resume heparin gtt. - IV iron on this admission. - Continue B12 and folate. - Continue serial CBC checks.  EGD - Normal esophagus. - 3 cm hiatal hernia. - Gastritis. Biopsied. - Normal mucosa was found in the gastric fundus and in the gastric body. Biopsied. - Siomara Burkel few gastric polyps. - Normal examined duodenum. Biopsied. Impression: - Perform Anabella Capshaw colonoscopy today. - Will follow-up on pathology results. - Additional accommodations pending colonoscopy findings.  Antimicrobials:  Anti-infectives (From admission, onward)    Start     Dose/Rate Route Frequency Ordered Stop   06/07/22 2200  oseltamivir (TAMIFLU) capsule 30 mg       See Hyperspace for full Linked Orders Report.   30 mg Oral 2 times daily 06/07/22 1455 06/12/22 0959  06/07/22 1545  oseltamivir (TAMIFLU) capsule 75 mg       See Hyperspace for full Linked Orders Report.   75 mg Oral  Once 06/07/22 1455 06/07/22 1616   06/04/22 0600  cefoTEtan (CEFOTAN) 2 g in sodium chloride 0.9 % 100 mL IVPB  Status:  Discontinued        2 g 200 mL/hr over 30 Minutes Intravenous On call to O.R. 06/03/22 4098 06/04/22 0956   06/03/22 1400  neomycin (MYCIFRADIN) tablet 1,000 mg       See Hyperspace for full Linked Orders Report.   1,000  mg Oral 3 times per day 06/03/22 0841 06/03/22 2159   06/03/22 1400  metroNIDAZOLE (FLAGYL) tablet 1,000 mg       See Hyperspace for full Linked Orders Report.   1,000 mg Oral 3 times per day 06/03/22 0841 06/03/22 2116   05/31/22 0600  cefoTEtan (CEFOTAN) 2 g in sodium chloride 0.9 % 100 mL IVPB        2 g 200 mL/hr over 30 Minutes Intravenous On call to O.R. 05/30/22 1716 06/01/22 0559   05/30/22 2200  neomycin (MYCIFRADIN) tablet 1,000 mg       See Hyperspace for full Linked Orders Report.   1,000 mg Oral 3 times per day 05/30/22 1716 05/30/22 2238   05/30/22 2200  metroNIDAZOLE (FLAGYL) tablet 1,000 mg       See Hyperspace for full Linked Orders Report.   1,000 mg Oral 3 times per day 05/30/22 1716 05/30/22 2128       Subjective: Feels better than yesterday  Objective: Vitals:   06/07/22 1617 06/08/22 0011 06/08/22 0351 06/08/22 0937  BP: (!) 118/59 (!) 110/58 (!) 91/55 129/61  Pulse: 86 81 82 77  Resp: '15 16 19 20  '$ Temp: 98.9 F (37.2 C) 98.4 F (36.9 C) 97.9 F (36.6 C) 98 F (36.7 C)  TempSrc: Axillary Axillary Axillary Axillary  SpO2: 99% 99% 98% 90%  Weight:   91.8 kg   Height:        Intake/Output Summary (Last 24 hours) at 06/08/2022 1146 Last data filed at 06/08/2022 1010 Gross per 24 hour  Intake 240 ml  Output --  Net 240 ml   Filed Weights   06/06/22 0326 06/07/22 0426 06/08/22 0351  Weight: 95.7 kg 92.3 kg 91.8 kg    Examination:  General: No acute distress. Cardiovascular: RRR Lungs: unlabored Abdomen: Soft, nontender, nondistended - midline staples well appearing Neurological: alert, still some mild delay with responses, but improved from yesterday Extremities: No clubbing or cyanosis. No edema.  Data Reviewed: I have personally reviewed following labs and imaging studies  CBC: Recent Labs  Lab 06/04/22 0232 06/05/22 0357 06/05/22 2030 06/06/22 0443 06/07/22 0321 06/08/22 0450  WBC 8.1 11.4*  --  10.7* 6.8 6.6  NEUTROABS  --   --    --   --  4.9 4.6  HGB 9.0* 8.1* 8.4* 8.5* 8.9* 8.2*  HCT 30.1* 26.1* 26.8* 28.5* 30.5* 28.8*  MCV 83.1 81.1  --  82.4 83.3 86.5  PLT 222 221  --  245 266 119    Basic Metabolic Panel: Recent Labs  Lab 06/04/22 0232 06/05/22 0357 06/06/22 0443 06/07/22 0321 06/08/22 0450  NA 136 135 136 140 140  K 4.4 4.2 3.5 3.3* 3.5  CL 107 106 103 102 103  CO2 '22 23 26 27 28  '$ GLUCOSE 83 110* 94 90 85  BUN '8 9 11 10 12  '$ CREATININE 0.89  0.91 0.95 1.02* 1.06*  CALCIUM 8.0* 7.9* 7.9* 8.0* 7.8*  MG  --   --   --  2.1 2.4  PHOS  --   --   --  3.2 3.9    GFR: Estimated Creatinine Clearance: 46.6 mL/min (Jerimie Mancuso) (by C-G formula based on SCr of 1.06 mg/dL (H)).  Liver Function Tests: Recent Labs  Lab 06/07/22 0321 06/08/22 0450  AST 31 30  ALT 17 14  ALKPHOS 50 45  BILITOT 0.6 0.6  PROT 5.6* 5.2*  ALBUMIN 2.5* 2.3*    CBG: No results for input(s): "GLUCAP" in the last 168 hours.   Recent Results (from the past 240 hour(s))  Surgical pcr screen     Status: Abnormal   Collection Time: 06/03/22  8:12 PM   Specimen: Nasal Mucosa; Nasal Swab  Result Value Ref Range Status   MRSA, PCR NEGATIVE NEGATIVE Final   Staphylococcus aureus POSITIVE (Debany Vantol) NEGATIVE Final    Comment: (NOTE) The Xpert SA Assay (FDA approved for NASAL specimens in patients 65 years of age and older), is one component of Lashan Macias comprehensive surveillance program. It is not intended to diagnose infection nor to guide or monitor treatment. Performed at [facility], [place] [address], [place],  [address]   Resp panel by RT-PCR (RSV, Flu Magali Bray&B, Covid) Anterior Nasal Swab     Status: Abnormal   Collection Time: 06/07/22 10:09 AM   Specimen: Anterior Nasal Swab  Result Value Ref Range Status   SARS Coronavirus 2 by RT PCR NEGATIVE NEGATIVE Final    Comment: (NOTE) SARS-CoV-2 target nucleic acids are NOT DETECTED.  The SARS-CoV-2 RNA is generally detectable in upper respiratory specimens during the acute phase of  infection. The lowest concentration of SARS-CoV-2 viral copies this assay can detect is 138 copies/mL. Tremell Reimers negative result does not preclude SARS-Cov-2 infection and should not be used as the sole basis for treatment or other patient management decisions. Arvon Schreiner negative result may occur with  improper specimen collection/handling, submission of specimen other than nasopharyngeal swab, presence of viral mutation(s) within the areas targeted by this assay, and inadequate number of viral copies(<138 copies/mL). Hisayo Delossantos negative result must be combined with clinical observations, patient history, and epidemiological information. The expected result is Negative.  Fact Sheet for Patients:  EntrepreneurPulse.com.au  Fact Sheet for Healthcare Providers:  IncredibleEmployment.be  This test is no t yet approved or cleared by the [place] FDA and  has been authorized for detection and/or diagnosis of SARS-CoV-2 by FDA under an Emergency Use Authorization (EUA). This EUA will remain  in effect (meaning this test can be used) for the duration of the COVID-19 declaration under Section 564(b)(1) of the Act, 21 U.S.C.section 360bbb-3(b)(1), unless the authorization is terminated  or revoked sooner.       Influenza Majel Giel by PCR NEGATIVE NEGATIVE Final   Influenza B by PCR POSITIVE (Kristalynn Coddington) NEGATIVE Final    Comment: (NOTE) The Xpert Xpress SARS-CoV-2/FLU/RSV plus assay is intended as an aid in the diagnosis of influenza from Nasopharyngeal swab specimens and should not be used as Erland Vivas sole basis for treatment. Nasal washings and aspirates are unacceptable for Xpert Xpress SARS-CoV-2/FLU/RSV testing.  Fact Sheet for Patients: EntrepreneurPulse.com.au  Fact Sheet for Healthcare Providers: IncredibleEmployment.be  This test is not yet approved or cleared by the [place] FDA and has been authorized for detection and/or diagnosis of  SARS-CoV-2 by FDA under an Emergency Use Authorization (EUA). This EUA will remain in effect (meaning this  test can be used) for the duration of the COVID-19 declaration under Section 564(b)(1) of the Act, 21 U.S.C. section 360bbb-3(b)(1), unless the authorization is terminated or revoked.     Resp Syncytial Virus by PCR NEGATIVE NEGATIVE Final    Comment: (NOTE) Fact Sheet for Patients: EntrepreneurPulse.com.au  Fact Sheet for Healthcare Providers: IncredibleEmployment.be  This test is not yet approved or cleared by the [place] FDA and has been authorized for detection and/or diagnosis of SARS-CoV-2 by FDA under an Emergency Use Authorization (EUA). This EUA will remain in effect (meaning this test can be used) for the duration of the COVID-19 declaration under Section 564(b)(1) of the Act, 21 U.S.C. section 360bbb-3(b)(1), unless the authorization is terminated or revoked.  Performed at [facility], [place] [address], [place],  [address]          Radiology Studies: MR BRAIN WO CONTRAST  Result Date: 06/08/2022 CLINICAL DATA:  Altered mental status EXAM: MRI HEAD WITHOUT CONTRAST TECHNIQUE: Multiplanar, multiecho pulse sequences of the brain and surrounding structures were obtained without intravenous contrast. COMPARISON:  None Available. FINDINGS: Brain: No acute infarction, hemorrhage, hydrocephalus, extra-axial collection or mass lesion. No chronic microhemorrhage. There is multifocal hyperintense T2-weighted signal within the periventricular and deep white matter. Dilated perivascular space at the left lentiform nucleus. Mild generalized volume loss. The midline structures are normal. Vascular: Normal flow voids. Skull and upper cervical spine: Normal bone marrow signal. Sinuses/Orbits: Bilateral mastoid fluid. Nasopharynx is clear. Paranasal sinuses are normal. Normal orbits. Other: None IMPRESSION: 1. No acute  intracranial abnormality. 2. Findings of chronic microvascular ischemia and generalized volume loss. 3. Bilateral mastoid fluid. Electronically Signed   By: Ulyses Jarred M.D.   On: 06/08/2022 00:44        Scheduled Meds:  acetaminophen  650 mg Oral Q6H   alteplase  2 mg Intracatheter Once   alteplase  2 mg Intracatheter Once   alvimopan  12 mg Oral BID   amiodarone  200 mg Oral BID   atorvastatin  10 mg Oral Daily   Chlorhexidine Gluconate Cloth  6 each Topical Daily   [START ON 06/09/2022] cyanocobalamin  1,000 mcg Intramuscular Q Sun   feeding supplement  237 mL Oral TID BM   folic acid  1 mg Oral Daily   furosemide  20 mg Oral Daily   gabapentin  300 mg Oral TID   metoprolol succinate  25 mg Oral Daily   oseltamivir  30 mg Oral BID   pantoprazole (PROTONIX) IV  40 mg Intravenous QHS   rivaroxaban  15 mg Oral BID WC   sodium chloride flush  10-40 mL Intracatheter Q12H   Continuous Infusions:  methocarbamol (ROBAXIN) IV       LOS: 12 days    Time spent: over 30 min    Fayrene Helper, MD [facility]   To contact the attending provider between 7A-7P or the covering provider during after hours 7P-7A, please log into the web site [email or web address] and access using universal [place] password for that web site. If you do not have the password, please call the hospital operator.  06/08/2022, 11:46 AM

## 2022-06-08 NOTE — Progress Notes (Signed)
[place] for transition from enoxaparin to PTA Xarelto Indication: DVT  No Known Allergies  Patient Measurements: Height: '5\' 1"'$  (154.9 cm) Weight: 91.8 kg (202 lb 6.1 oz) IBW/kg (Calculated) : 47.8 Heparin Dosing Weight: 69.7 kg  Vital Signs: Temp: 98 F (36.7 C) (12/30 0937) Temp Source: Axillary (12/30 0937) BP: 129/61 (12/30 0937) Pulse Rate: 77 (12/30 0937)  Labs: Recent Labs    06/06/22 0443 06/07/22 0321 06/08/22 0450  HGB 8.5* 8.9* 8.2*  HCT 28.5* 30.5* 28.8*  PLT 245 266 261  CREATININE 0.95 1.02* 1.06*     Estimated Creatinine Clearance: 46.6 mL/min (A) (by C-G formula based on SCr of 1.06 mg/dL (H)).   Medical History: Past Medical History:  Diagnosis Date   Breast cancer ([place]) 1999   Stage I in left breast   Chronic combined systolic and diastolic CHF (congestive heart failure) ([place])    a. 02/2013 Echo: EF 30-35%, diff HK, biatrial enlargement. b. 2D echo 07/2014 showed mildly dilated LV, EF 45-50%, severely dilated left atrium and moderately dilated right atrium.   CKD (chronic kidney disease), stage II    Closed fracture of unspecified part of neck of femur 2011   Right.  Soreness when standing. (Dr. Durward Fortes)   Nonischemic cardiomyopathy [facility])    a. 02/2013 [facility] CL: No ischemia, prob attenuation vs scar, EF 40%-->Med Rx.   Persistent atrial fibrillation ([place])    a. 02/2013: s/p TEE and attempted cardioversion x 2-->failed-->rate controlled with bb/digoxin, Xarelto initiated.   Assessment: 76 year old female presenting with afib w/ RVR - on Xarelto PTA. While admitted was started on heparin infusion. Heparin held in s/o progressive anemia. After PICC line placement, patient developed DVT, pharmacy consulted to dose enoxaparin and now consulted to resume PTA Xarelto.   Patient has been on therapeutic enoxaparin since 12/27.   Goal of Therapy:  Monitor platelets by anticoagulation protocol: Yes   Plan:   Discontinue enoxaparin '90mg'$  SQ BID Start Xarelto 15 mg BID this PM x18 days, then 20 mg daily (subtracting therapeutic enoxaparin time from 21 day Xarelto load) Monitor CBC, s/sx of bleeding  Francena Hanly, PharmD Pharmacy Resident  06/08/2022 11:29 AM

## 2022-06-09 DIAGNOSIS — I4891 Unspecified atrial fibrillation: Secondary | ICD-10-CM | POA: Diagnosis not present

## 2022-06-09 LAB — CBC WITH DIFFERENTIAL/PLATELET
Abs Immature Granulocytes: 0.36 10*3/uL — ABNORMAL HIGH (ref 0.00–0.07)
Basophils Absolute: 0 10*3/uL (ref 0.0–0.1)
Basophils Relative: 0 %
Eosinophils Absolute: 0.1 10*3/uL (ref 0.0–0.5)
Eosinophils Relative: 2 %
HCT: 28.6 % — ABNORMAL LOW (ref 36.0–46.0)
Hemoglobin: 8.2 g/dL — ABNORMAL LOW (ref 12.0–15.0)
Immature Granulocytes: 5 %
Lymphocytes Relative: 21 %
Lymphs Abs: 1.6 10*3/uL (ref 0.7–4.0)
MCH: 25 pg — ABNORMAL LOW (ref 26.0–34.0)
MCHC: 28.7 g/dL — ABNORMAL LOW (ref 30.0–36.0)
MCV: 87.2 fL (ref 80.0–100.0)
Monocytes Absolute: 0.6 10*3/uL (ref 0.1–1.0)
Monocytes Relative: 7 %
Neutro Abs: 5 10*3/uL (ref 1.7–7.7)
Neutrophils Relative %: 65 %
Platelets: 292 10*3/uL (ref 150–400)
RBC: 3.28 MIL/uL — ABNORMAL LOW (ref 3.87–5.11)
RDW: 22.4 % — ABNORMAL HIGH (ref 11.5–15.5)
WBC: 7.7 10*3/uL (ref 4.0–10.5)
nRBC: 0.5 % — ABNORMAL HIGH (ref 0.0–0.2)

## 2022-06-09 LAB — COMPREHENSIVE METABOLIC PANEL
ALT: 16 U/L (ref 0–44)
AST: 26 U/L (ref 15–41)
Albumin: 2.2 g/dL — ABNORMAL LOW (ref 3.5–5.0)
Alkaline Phosphatase: 46 U/L (ref 38–126)
Anion gap: 3 — ABNORMAL LOW (ref 5–15)
BUN: 13 mg/dL (ref 8–23)
CO2: 28 mmol/L (ref 22–32)
Calcium: 7.4 mg/dL — ABNORMAL LOW (ref 8.9–10.3)
Chloride: 103 mmol/L (ref 98–111)
Creatinine, Ser: 0.98 mg/dL (ref 0.44–1.00)
GFR, Estimated: 60 mL/min — ABNORMAL LOW (ref >60)
Glucose, Bld: 89 mg/dL (ref 70–99)
Potassium: 3.7 mmol/L (ref 3.5–5.1)
Sodium: 134 mmol/L — ABNORMAL LOW (ref 135–145)
Total Bilirubin: 0.5 mg/dL (ref 0.3–1.2)
Total Protein: 5.3 g/dL — ABNORMAL LOW (ref 6.5–8.1)

## 2022-06-09 LAB — PHOSPHORUS: Phosphorus: 3.2 mg/dL (ref 2.5–4.6)

## 2022-06-09 LAB — MAGNESIUM: Magnesium: 2.2 mg/dL (ref 1.7–2.4)

## 2022-06-09 MED ORDER — PANTOPRAZOLE SODIUM 40 MG PO TBEC
40.0000 mg | DELAYED_RELEASE_TABLET | Freq: Every day | ORAL | Status: DC
  Administered 2022-06-09 – 2022-06-12 (×4): 40 mg via ORAL
  Filled 2022-06-09 (×4): qty 1

## 2022-06-09 NOTE — Plan of Care (Signed)
  Problem: Education: Goal: Knowledge of General Education information will improve Description: Including pain rating scale, medication(s)/side effects and non-pharmacologic comfort measures Outcome: Progressing   Problem: Health Behavior/Discharge Planning: Goal: Ability to manage health-related needs will improve Outcome: Progressing   Problem: Clinical Measurements: Goal: Ability to maintain clinical measurements within normal limits will improve Outcome: Progressing Goal: Will remain free from infection Outcome: Progressing Goal: Diagnostic test results will improve Outcome: Progressing Goal: Respiratory complications will improve Outcome: Progressing Goal: Cardiovascular complication will be avoided Outcome: Progressing   Problem: Activity: Goal: Risk for activity intolerance will decrease Outcome: Progressing   Problem: Nutrition: Goal: Adequate nutrition will be maintained Outcome: Progressing   Problem: Coping: Goal: Level of anxiety will decrease Outcome: Progressing   Problem: Elimination: Goal: Will not experience complications related to bowel motility Outcome: Progressing Goal: Will not experience complications related to urinary retention Outcome: Progressing   Problem: Pain Managment: Goal: General experience of comfort will improve Outcome: Progressing   Problem: Safety: Goal: Ability to remain free from injury will improve Outcome: Progressing   Problem: Skin Integrity: Goal: Risk for impaired skin integrity will decrease Outcome: Progressing   Problem: Education: Goal: Understanding of discharge needs will improve Outcome: Progressing Goal: Verbalization of understanding of the causes of altered bowel function will improve Outcome: Progressing   Problem: Activity: Goal: Ability to tolerate increased activity will improve Outcome: Progressing   Problem: Bowel/Gastric: Goal: Gastrointestinal status for postoperative course will  improve Outcome: Progressing   Problem: Health Behavior/Discharge Planning: Goal: Identification of community resources to assist with postoperative recovery needs will improve Outcome: Progressing   Problem: Nutritional: Goal: Will attain and maintain optimal nutritional status will improve Outcome: Progressing   Problem: Clinical Measurements: Goal: Postoperative complications will be avoided or minimized Outcome: Progressing   Problem: Respiratory: Goal: Respiratory status will improve Outcome: Progressing   Problem: Skin Integrity: Goal: Will show signs of wound healing Outcome: Progressing   

## 2022-06-09 NOTE — Progress Notes (Signed)
PROGRESS NOTE    [name]  YNW:[id number] DOB: [birth year] DOA: 05/27/2022 PCP: Tonia Ghent, MD  Chief Complaint  Patient presents with   Weakness    Brief Narrative:  Natasha Dickson is Natasha Dickson 76 y.o. female with medical history significant of atrial fibrillation on xarelto, systolic HF (recovered EF), non ischemic cardiomyopathy, hypertension and multiple other medical issues here with progressive generalized weakness.  Found to have new anemia and + FOBT as well as AFib with RVR. She was admitted with cardiology consultations. GI took for EGD/colonoscopy 12/20 which showed [name] "malignant completely obstructing tumor in the cecum and at the ileocecal valve," which has been found to be invasive adenocarcinoma. There were polyps in the transverse, descending, and sigmoid colon which were also sent to pathology ultimately ruling out for malignancy. CT abd/pelvis showed mesenteric LNs, no liver lesions. Rate control has been complicated by hypotension for which cardiology is consulted, maintaining marginal rate control with amiodarone with soft BP. She has received IV iron and 1u PRBCs 12/20 for progressive anemia and had heparin held due to this. Due to continued difficulty with venous access, PICC line placed, subsequently developed DVT of right brachial and axillary veins as well as SVT of bilateral cephalic veins for which therapeutic anticoagulation was reinitiated and PICC discontinued, right IJ inserted 12/24. Surgery performed hemicolectomy 12/26.    Assessment & Plan:   Principal Problem:   Atrial fibrillation with RVR (HCC) Active Problems:   B12 deficiency   Weakness   (HFpEF) heart failure with preserved ejection fraction (HCC)   Microcytic anemia   Fecal occult blood test positive   Colonic mass   Iron deficiency anemia due to chronic blood loss   Diverticulosis of colon without hemorrhage   Adenomatous polyp of transverse colon   Adenomatous polyp of descending colon    Adenomatous polyp of sigmoid colon   Gastritis and gastroduodenitis   Hiatal hernia  Invasive moderately differentiated adenocarcinoma of the cecum: Other polyps tested negative for malignancy. CT abd/pelvis staging scan showed subcentimeter mesenteric LNs and 4.5cm x 2.6cm thickening of cecum close to ileocecal junction. No hepatic abnormalities. CEA oddly only 3.6 > 4.1. Now s/p laparoscopic hand-assisted right colectomy by Dr. Thermon Leyland 12/26.  - Has multimodal pain regimen ordered - post op recs per surgery - soft diet per surgery- stable for d/c per surgery - Wound care per surgery.  - Follow up with oncology after discharge. - follow surgical pathology -> invasive colonic adenocarcinoma, will need to discuss with oncology (plan for Monday)   Influenza B Infection  - tamiflu  Delirium  Mild cognitive impairment:  - improved today - family concerned about possible stroke, I think this is less likely based on her exam, but will follow MRI brain (without acute stroke) - delirium precautions   Acute on chronic combined HFrEF with recovered EF, HTN: Needed IVF support while taking inadequate PO.  - diuresis per cardiology, appreciate assistance -> PO lasix at this time - Metoprolol - spironolactone on hold - Coronary calcifications noted on scans, though deferring ischemic evaluation at this time.   Acute hypoxic respiratory failure: Suspect pulmonary edema worsening due to intraoperative fluids, though had increased cough prior. WBC stable/normal, afebrile. - Check CXR (poor inspiratory effort, no focal infiltrate) - Provide supplemental O2 to maintain SpO2 >89% with normal WOB   Acute right brachial/axillary DVT, acute right cephalic and left cephalic SVT: In the setting of malignancy, remains hypercoagulable.  - continue lovenox -> transition to  xarelto - monitor Hb on anticoagulation   Persistent AFib with RVR:  - CHA2DS2-VASc score is 5. Anticoagulation as above - Rates  improved with digoxin. BP normalizing over past 24 hours. - Continue amiodarone, converted IV > PO 12/22. - appreciate cardiology recommendations, dig/amio per cards - amiodarone 200 mg BID x2 weeks (until 06/15/2022), then daily on 1/7.  Dig d/c'd.  Metoprolol, uptitrate as tolerated.   Symptomatic iron deficiency, acute on chronic blood loss anemia: Ferritin is 7, iron 15, 3% sat, TIBC up to 535. Suspect thrombocytosis is reactive to this. s/p 1u PRBC 12/22. - IV iron given, stopped oral iron with dark stools complicating ability to discern GI bleeding. Stools now [place]. - Hgb fluctuating, will follow    Vitamin B12 deficiency: Level is 166. - IM supplemented daily x7 days, now weekly   HLD: CK low.  - Statin   Stage IIIa CKD: Based on available CrCl values.  - Avoid nephrotoxins as able.  - CrCl continues improvement   Obesity: Body mass index is 39.11 kg/m.    Hypokalemia: replace and follow  Central line in place, R IJ 12/24 (limited peripheral access due to UE DVT and SVT) I think she's approaching readiness to remove this.  Will discuss with RN as she won't have access after this.  Plan for removal later today or tomorrow.    DVT prophylaxis: lovenox Code Status: full code Family Communication: none at bedside 12/30 or 12/31 Disposition:   Status is: Inpatient Remains inpatient appropriate because: need for post op care - she really wants to return home after discharge - doesn't seem particularly interested in rehab - will discuss with family    Consultants:  Cardiology Surgery GI  Procedures:  12/26 LAPAROSCOPIC HAND ASSISTED RIGHT COLECTOMY   12/23 Summary:    Right:  Findings consistent with acute deep vein thrombosis involving the right  axillary  vein and right brachial veins. Findings consistent with acute superficial  vein  thrombosis involving the right basilic vein.    Left:  Findings consistent with acute superficial vein thrombosis involving the   left  cephalic vein.   EGD - Preparation of the colon was fair, but adequate for the purposes of this study. - Malignant completely obstructing tumor in the cecum and at the ileocecal valve. Biopsied. Tattooed. - Two 4 to 5 mm polyps in the descending colon and in the transverse colon, removed with Atom Solivan cold snare. Resected and retrieved. - One 8 mm polyp in the sigmoid colon, removed with Stephonie Wilcoxen cold snare. Resected and retrieved. - Diverticulosis in the sigmoid colon. - Non-bleeding internal hemorrhoids. - Stool in the sigmoid colon and in the descending colon. recommendation - Return patient to hospital ward for ongoing care. - Clear liquid diet today pending surgical evaluation. If no plan for inpatient surgery, will advance diet. - Continue present medications. - Await pathology results. - Consult inpatient General Surgery - Consult inpatient General Surgery/Colorectal Surgery today. - CT abdomen/pelvis for staging. Completed CT chest on 05/27/2022. - CEA. - Ok to resume heparin gtt. - IV iron on this admission. - Continue B12 and folate. - Continue serial CBC checks.  EGD - Normal esophagus. - 3 cm hiatal hernia. - Gastritis. Biopsied. - Normal mucosa was found in the gastric fundus and in the gastric body. Biopsied. - Adiel Mcnamara few gastric polyps. - Normal examined duodenum. Biopsied. Impression: - Perform Saleem Coccia colonoscopy today. - Will follow-up on pathology results. - Additional accommodations pending colonoscopy findings.  Antimicrobials:  Anti-infectives (  From admission, onward)    Start     Dose/Rate Route Frequency Ordered Stop   06/07/22 2200  oseltamivir (TAMIFLU) capsule 30 mg       See Hyperspace for full Linked Orders Report.   30 mg Oral 2 times daily 06/07/22 1455 06/12/22 0959   06/07/22 1545  oseltamivir (TAMIFLU) capsule 75 mg       See Hyperspace for full Linked Orders Report.   75 mg Oral  Once 06/07/22 1455 06/07/22 1616   06/04/22 0600  cefoTEtan (CEFOTAN)  2 g in sodium chloride 0.9 % 100 mL IVPB  Status:  Discontinued        2 g 200 mL/hr over 30 Minutes Intravenous On call to O.R. 06/03/22 4196 06/04/22 0956   06/03/22 1400  neomycin (MYCIFRADIN) tablet 1,000 mg       See Hyperspace for full Linked Orders Report.   1,000 mg Oral 3 times per day 06/03/22 0841 06/03/22 2159   06/03/22 1400  metroNIDAZOLE (FLAGYL) tablet 1,000 mg       See Hyperspace for full Linked Orders Report.   1,000 mg Oral 3 times per day 06/03/22 0841 06/03/22 2116   05/31/22 0600  cefoTEtan (CEFOTAN) 2 g in sodium chloride 0.9 % 100 mL IVPB        2 g 200 mL/hr over 30 Minutes Intravenous On call to O.R. 05/30/22 1716 06/01/22 0559   05/30/22 2200  neomycin (MYCIFRADIN) tablet 1,000 mg       See Hyperspace for full Linked Orders Report.   1,000 mg Oral 3 times per day 05/30/22 1716 05/30/22 2238   05/30/22 2200  metroNIDAZOLE (FLAGYL) tablet 1,000 mg       See Hyperspace for full Linked Orders Report.   1,000 mg Oral 3 times per day 05/30/22 1716 05/30/22 2128       Subjective: No new complaints  Objective: Vitals:   06/08/22 2309 06/09/22 0548 06/09/22 0928 06/09/22 1112  BP: 99/64 (!) 130/57 139/68 (!) 133/57  Pulse: 79 80 70 79  Resp: '19 16 16 15  '$ Temp: 98 F (36.7 C) 98 F (36.7 C) 97.6 F (36.4 C)   TempSrc: Axillary Axillary Oral   SpO2: 100% 100% 99% 93%  Weight:  90.7 kg    Height:        Intake/Output Summary (Last 24 hours) at 06/09/2022 1610 Last data filed at 06/09/2022 2229 Gross per 24 hour  Intake --  Output 0 ml  Net 0 ml   Filed Weights   06/07/22 0426 06/08/22 0351 06/09/22 0548  Weight: 92.3 kg 91.8 kg 90.7 kg    Examination:  General: No acute distress. Cardiovascular: rate controlled. Lungs: unlabored Abdomen: Soft, nontender, nondistended  Neurological: Alert and oriented 3. Moves all extremities 4 with equal strength. Cranial nerves II through XII grossly intact. Extremities: No clubbing or cyanosis. No  edema.  Data Reviewed: I have personally reviewed following labs and imaging studies  CBC: Recent Labs  Lab 06/05/22 0357 06/05/22 2030 06/06/22 0443 06/07/22 0321 06/08/22 0450 06/09/22 0950  WBC 11.4*  --  10.7* 6.8 6.6 7.7  NEUTROABS  --   --   --  4.9 4.6 5.0  HGB 8.1* 8.4* 8.5* 8.9* 8.2* 8.2*  HCT 26.1* 26.8* 28.5* 30.5* 28.8* 28.6*  MCV 81.1  --  82.4 83.3 86.5 87.2  PLT 221  --  245 266 261 798    Basic Metabolic Panel: Recent Labs  Lab 06/05/22 0357 06/06/22 0443 06/07/22  0321 06/08/22 0450 06/09/22 0950  NA 135 136 140 140 134*  K 4.2 3.5 3.3* 3.5 3.7  CL 106 103 102 103 103  CO2 '23 26 27 28 28  '$ GLUCOSE 110* 94 90 85 89  BUN '9 11 10 12 13  '$ CREATININE 0.91 0.95 1.02* 1.06* 0.98  CALCIUM 7.9* 7.9* 8.0* 7.8* 7.4*  MG  --   --  2.1 2.4 2.2  PHOS  --   --  3.2 3.9 3.2    GFR: Estimated Creatinine Clearance: 50.1 mL/min (by C-G formula based on SCr of 0.98 mg/dL).  Liver Function Tests: Recent Labs  Lab 06/07/22 0321 06/08/22 0450 06/09/22 0950  AST '31 30 26  '$ ALT '17 14 16  '$ ALKPHOS 50 45 46  BILITOT 0.6 0.6 0.5  PROT 5.6* 5.2* 5.3*  ALBUMIN 2.5* 2.3* 2.2*    CBG: No results for input(s): "GLUCAP" in the last 168 hours.   Recent Results (from the past 240 hour(s))  Surgical pcr screen     Status: Abnormal   Collection Time: 06/03/22  8:12 PM   Specimen: Nasal Mucosa; Nasal Swab  Result Value Ref Range Status   MRSA, PCR NEGATIVE NEGATIVE Final   Staphylococcus aureus POSITIVE (Carole Deere) NEGATIVE Final    Comment: (NOTE) The Xpert SA Assay (FDA approved for NASAL specimens in patients 27 years of age and older), is one component of Nikka Hakimian comprehensive surveillance program. It is not intended to diagnose infection nor to guide or monitor treatment. Performed at [facility], [place] [address], [place], [place] [address]   Resp panel by RT-PCR (RSV, Flu Chyan Carnero&B, Covid) Anterior Nasal Swab     Status: Abnormal   Collection Time: 06/07/22 10:09 AM    Specimen: Anterior Nasal Swab  Result Value Ref Range Status   SARS Coronavirus 2 by RT PCR NEGATIVE NEGATIVE Final    Comment: (NOTE) SARS-CoV-2 target nucleic acids are NOT DETECTED.  The SARS-CoV-2 RNA is generally detectable in upper respiratory specimens during the acute phase of infection. The lowest concentration of SARS-CoV-2 viral copies this assay can detect is 138 copies/mL. Izel Eisenhardt negative result does not preclude SARS-Cov-2 infection and should not be used as the sole basis for treatment or other patient management decisions. Heleena Miceli negative result may occur with  improper specimen collection/handling, submission of specimen other than nasopharyngeal swab, presence of viral mutation(s) within the areas targeted by this assay, and inadequate number of viral copies(<138 copies/mL). Kassandra Meriweather negative result must be combined with clinical observations, patient history, and epidemiological information. The expected result is Negative.  Fact Sheet for Patients:  EntrepreneurPulse.com.au  Fact Sheet for Healthcare Providers:  IncredibleEmployment.be  This test is no t yet approved or cleared by the [place] FDA and  has been authorized for detection and/or diagnosis of SARS-CoV-2 by FDA under an Emergency Use Authorization (EUA). This EUA will remain  in effect (meaning this test can be used) for the duration of the COVID-19 declaration under Section 564(b)(1) of the Act, 21 U.S.C.section 360bbb-3(b)(1), unless the authorization is terminated  or revoked sooner.       Influenza Nasif Bos by PCR NEGATIVE NEGATIVE Final   Influenza B by PCR POSITIVE (Taela Charbonneau) NEGATIVE Final    Comment: (NOTE) The Xpert Xpress SARS-CoV-2/FLU/RSV plus assay is intended as an aid in the diagnosis of influenza from Nasopharyngeal swab specimens and should not be used as Salayah Meares sole basis for treatment. Nasal washings and aspirates are unacceptable for Xpert Xpress  SARS-CoV-2/FLU/RSV testing.  Fact Sheet for Patients: EntrepreneurPulse.com.au  Fact Sheet for Healthcare Providers: IncredibleEmployment.be  This test is not yet approved or cleared by the [place] FDA and has been authorized for detection and/or diagnosis of SARS-CoV-2 by FDA under an Emergency Use Authorization (EUA). This EUA will remain in effect (meaning this test can be used) for the duration of the COVID-19 declaration under Section 564(b)(1) of the Act, 21 U.S.C. section 360bbb-3(b)(1), unless the authorization is terminated or revoked.     Resp Syncytial Virus by PCR NEGATIVE NEGATIVE Final    Comment: (NOTE) Fact Sheet for Patients: EntrepreneurPulse.com.au  Fact Sheet for Healthcare Providers: IncredibleEmployment.be  This test is not yet approved or cleared by the [place] FDA and has been authorized for detection and/or diagnosis of SARS-CoV-2 by FDA under an Emergency Use Authorization (EUA). This EUA will remain in effect (meaning this test can be used) for the duration of the COVID-19 declaration under Section 564(b)(1) of the Act, 21 U.S.C. section 360bbb-3(b)(1), unless the authorization is terminated or revoked.  Performed at [facility], [place] [address] [place], [place] [address]          Radiology Studies: MR BRAIN WO CONTRAST  Result Date: 06/08/2022 CLINICAL DATA:  Altered mental status EXAM: MRI HEAD WITHOUT CONTRAST TECHNIQUE: Multiplanar, multiecho pulse sequences of the brain and surrounding structures were obtained without intravenous contrast. COMPARISON:  None Available. FINDINGS: Brain: No acute infarction, hemorrhage, hydrocephalus, extra-axial collection or mass lesion. No chronic microhemorrhage. There is multifocal hyperintense T2-weighted signal within the periventricular and deep white matter. Dilated perivascular space at the left lentiform  nucleus. Mild generalized volume loss. The midline structures are normal. Vascular: Normal flow voids. Skull and upper cervical spine: Normal bone marrow signal. Sinuses/Orbits: Bilateral mastoid fluid. Nasopharynx is clear. Paranasal sinuses are normal. Normal orbits. Other: None IMPRESSION: 1. No acute intracranial abnormality. 2. Findings of chronic microvascular ischemia and generalized volume loss. 3. Bilateral mastoid fluid. Electronically Signed   By: Ulyses Jarred M.D.   On: 06/08/2022 00:44        Scheduled Meds:  acetaminophen  650 mg Oral Q6H   alteplase  2 mg Intracatheter Once   alteplase  2 mg Intracatheter Once   alvimopan  12 mg Oral BID   amiodarone  200 mg Oral BID   atorvastatin  10 mg Oral Daily   Chlorhexidine Gluconate Cloth  6 each Topical Daily   cyanocobalamin  1,000 mcg Intramuscular Q Sun   feeding supplement  237 mL Oral TID BM   folic acid  1 mg Oral Daily   furosemide  20 mg Oral Daily   gabapentin  300 mg Oral TID   metoprolol succinate  25 mg Oral Daily   oseltamivir  30 mg Oral BID   pantoprazole (PROTONIX) IV  40 mg Intravenous QHS   rivaroxaban  15 mg Oral BID WC   sodium chloride flush  10-40 mL Intracatheter Q12H   Continuous Infusions:  methocarbamol (ROBAXIN) IV       LOS: 13 days    Time spent: over 30 min    Fayrene Helper, MD [facility]   To contact the attending provider between 7A-7P or the covering provider during after hours 7P-7A, please log into the web site [email or web address] and access using universal [place] password for that web site. If you do not have the password, please call the hospital operator.  06/09/2022, 4:10 PM

## 2022-06-09 NOTE — Progress Notes (Signed)
5 Days Post-Op   Subjective/Chief Complaint: Patient more awake today.  She has no complaints.  She is tolerating her diet and her bowels are moving.   Objective: Vital signs in last 24 hours: Temp:  [98 F (36.7 C)] 98 F (36.7 C) (12/31 0548) Pulse Rate:  [70-80] 80 (12/31 0548) Resp:  [16-20] 16 (12/31 0548) BP: (99-130)/(57-67) 130/57 (12/31 0548) SpO2:  [90 %-100 %] 100 % (12/31 0548) Weight:  [90.7 kg] 90.7 kg (12/31 0548) Last BM Date : 06/08/22  Intake/Output from previous day: 12/30 0701 - 12/31 0700 In: 240 [P.O.:240] Out: -  Intake/Output this shift: No intake/output data recorded.  Incision/Wound: Incision clean dry intact.  Abdomen soft nontender without rebound or guarding.  Lab Results:  Recent Labs    06/07/22 0321 06/08/22 0450  WBC 6.8 6.6  HGB 8.9* 8.2*  HCT 30.5* 28.8*  PLT 266 261   BMET Recent Labs    06/07/22 0321 06/08/22 0450  NA 140 140  K 3.3* 3.5  CL 102 103  CO2 27 28  GLUCOSE 90 85  BUN 10 12  CREATININE 1.02* 1.06*  CALCIUM 8.0* 7.8*   PT/INR No results for input(s): "LABPROT", "INR" in the last 72 hours. ABG No results for input(s): "PHART", "HCO3" in the last 72 hours.  Invalid input(s): "PCO2", "PO2"  Studies/Results: MR BRAIN WO CONTRAST  Result Date: 06/08/2022 CLINICAL DATA:  Altered mental status EXAM: MRI HEAD WITHOUT CONTRAST TECHNIQUE: Multiplanar, multiecho pulse sequences of the brain and surrounding structures were obtained without intravenous contrast. COMPARISON:  None Available. FINDINGS: Brain: No acute infarction, hemorrhage, hydrocephalus, extra-axial collection or mass lesion. No chronic microhemorrhage. There is multifocal hyperintense T2-weighted signal within the periventricular and deep white matter. Dilated perivascular space at the left lentiform nucleus. Mild generalized volume loss. The midline structures are normal. Vascular: Normal flow voids. Skull and upper cervical spine: Normal bone marrow  signal. Sinuses/Orbits: Bilateral mastoid fluid. Nasopharynx is clear. Paranasal sinuses are normal. Normal orbits. Other: None IMPRESSION: 1. No acute intracranial abnormality. 2. Findings of chronic microvascular ischemia and generalized volume loss. 3. Bilateral mastoid fluid. Electronically Signed   By: Ulyses Jarred M.D.   On: 06/08/2022 00:44    Anti-infectives: Anti-infectives (From admission, onward)    Start     Dose/Rate Route Frequency Ordered Stop   06/07/22 2200  oseltamivir (TAMIFLU) capsule 30 mg       See Hyperspace for full Linked Orders Report.   30 mg Oral 2 times daily 06/07/22 1455 06/12/22 0959   06/07/22 1545  oseltamivir (TAMIFLU) capsule 75 mg       See Hyperspace for full Linked Orders Report.   75 mg Oral  Once 06/07/22 1455 06/07/22 1616   06/04/22 0600  cefoTEtan (CEFOTAN) 2 g in sodium chloride 0.9 % 100 mL IVPB  Status:  Discontinued        2 g 200 mL/hr over 30 Minutes Intravenous On call to O.R. 06/03/22 5638 06/04/22 0956   06/03/22 1400  neomycin (MYCIFRADIN) tablet 1,000 mg       See Hyperspace for full Linked Orders Report.   1,000 mg Oral 3 times per day 06/03/22 0841 06/03/22 2159   06/03/22 1400  metroNIDAZOLE (FLAGYL) tablet 1,000 mg       See Hyperspace for full Linked Orders Report.   1,000 mg Oral 3 times per day 06/03/22 0841 06/03/22 2116   05/31/22 0600  cefoTEtan (CEFOTAN) 2 g in sodium chloride 0.9 % 100 mL  IVPB        2 g 200 mL/hr over 30 Minutes Intravenous On call to O.R. 05/30/22 1716 06/01/22 0559   05/30/22 2200  neomycin (MYCIFRADIN) tablet 1,000 mg       See Hyperspace for full Linked Orders Report.   1,000 mg Oral 3 times per day 05/30/22 1716 05/30/22 2238   05/30/22 2200  metroNIDAZOLE (FLAGYL) tablet 1,000 mg       See Hyperspace for full Linked Orders Report.   1,000 mg Oral 3 times per day 05/30/22 1716 05/30/22 2128       Assessment/Plan: s/p Procedure(s): LAPAROSCOPIC HAND ASSISTED RIGHT COLECTOMY (Right) Bowels  are moving.  Tolerating diet  Pain control is good  She is stable from a surgical standpoint for discharge or placement once her medical issues resolved.  LOS: 13 days    Turner Daniels MD 06/09/2022

## 2022-06-10 DIAGNOSIS — I4891 Unspecified atrial fibrillation: Secondary | ICD-10-CM | POA: Diagnosis not present

## 2022-06-10 NOTE — TOC Progression Note (Signed)
Transition of Care [facility]) - Progression Note    Patient Details  Name: Natasha Dickson MRN: [id number] Date of Birth: 02-Nov-1945  [facility] CM/SW [place], [place] Phone Number: 06/10/2022, 3:16 PM  Clinical Narrative:     CSW informed by MD and CM that PT recommending SNF for patient at dc.CSW spoke with patient regarding possible SNF placement when medically ready for dc. Patient politely declined SNF placement. Patient informed CSW that she spoke with her spouse and that he is in agreement with her returning back home when medically ready for dc. CSW informed MD and CM.    Expected Discharge Plan: [place] Barriers to Discharge: Continued Medical Work up  Expected Discharge Plan and Services In-house Referral: NA Discharge Planning Services: CM Consult Post Acute Care Choice: [place] arrangements for the past 2 months: Single Family Home                   DME Agency: NA       HH Arranged: PT, OT           Social Determinants of Health (SDOH) Interventions SDOH Screenings   Food Insecurity: No Food Insecurity (10/24/2021)  Housing: Low Risk  (10/24/2021)  Transportation Needs: No Transportation Needs (10/24/2021)  Alcohol Screen: Low Risk  (10/24/2021)  Depression (PHQ2-9): Low Risk  (10/24/2021)  Financial Resource Strain: Low Risk  (10/24/2021)  Physical Activity: Inactive (10/24/2021)  Social Connections: Moderately Isolated (10/24/2021)  Stress: No Stress Concern Present (10/24/2021)  Tobacco Use: Low Risk  (06/05/2022)    Readmission Risk Interventions     No data to display

## 2022-06-10 NOTE — Progress Notes (Signed)
PROGRESS NOTE    Natasha Dickson  MPN:[id number] DOB: 09-18-1945 DOA: 05/27/2022 PCP: Tonia Ghent, MD  Chief Complaint  Patient presents with   Weakness    Brief Narrative:  Natasha Dickson is Natasha Dickson 77 y.o. female with medical history significant of atrial fibrillation on xarelto, systolic HF (recovered EF), non ischemic cardiomyopathy, hypertension and multiple other medical issues here with progressive generalized weakness.  Found to have new anemia and + FOBT as well as AFib with RVR. She was admitted with cardiology consultations. GI took for EGD/colonoscopy 12/20 which showed Natasha Dickson "malignant completely obstructing tumor in the cecum and at the ileocecal valve," which has been found to be invasive adenocarcinoma. There were polyps in the transverse, descending, and sigmoid colon which were also sent to pathology ultimately ruling out for malignancy. CT abd/pelvis showed mesenteric LNs, no liver lesions. Rate control has been complicated by hypotension for which cardiology is consulted, maintaining marginal rate control with amiodarone with soft BP. She has received IV iron and 1u PRBCs 12/20 for progressive anemia and had heparin held due to this. Due to continued difficulty with venous access, PICC line placed, subsequently developed DVT of right brachial and axillary veins as well as SVT of bilateral cephalic veins for which therapeutic anticoagulation was reinitiated and PICC discontinued, right IJ inserted 12/24. Surgery performed hemicolectomy 12/26.    Assessment & Plan:   Principal Problem:   Atrial fibrillation with RVR (HCC) Active Problems:   B12 deficiency   Weakness   (HFpEF) heart failure with preserved ejection fraction (HCC)   Microcytic anemia   Fecal occult blood test positive   Colonic mass   Iron deficiency anemia due to chronic blood loss   Diverticulosis of colon without hemorrhage   Adenomatous polyp of transverse colon   Adenomatous polyp of descending colon    Adenomatous polyp of sigmoid colon   Gastritis and gastroduodenitis   Hiatal hernia  Invasive moderately differentiated adenocarcinoma of the cecum: Other polyps tested negative for malignancy. CT abd/pelvis staging scan showed subcentimeter mesenteric LNs and 4.5cm x 2.6cm thickening of cecum close to ileocecal junction. No hepatic abnormalities. CEA oddly only 3.6 > 4.1. Now s/p laparoscopic hand-assisted right colectomy by Dr. Thermon Dickson 12/26.  - Has multimodal pain regimen ordered - post op recs per surgery - soft diet per surgery- stable for d/c per surgery - Wound care per surgery.  - Follow up with oncology after discharge. - follow surgical pathology -> invasive colonic adenocarcinoma, will need to discuss with oncology.  Discussed with oncology, who recommended follow up in 1 month outpatient.  Influenza B Infection  - tamiflu  Delirium  Mild cognitive impairment:  - improved today - MRI brain (without acute stroke) - delirium precautions   Acute on chronic combined HFrEF with recovered EF, HTN: Needed IVF support while taking inadequate PO.  - diuresis per cardiology, appreciate assistance -> PO lasix at this time - Metoprolol - spironolactone on hold - Coronary calcifications noted on scans, though deferring ischemic evaluation at this time.   Acute hypoxic respiratory failure: Suspect pulmonary edema worsening due to intraoperative fluids, though had increased cough prior. WBC stable/normal, afebrile. - Check CXR (poor inspiratory effort, no focal infiltrate) - Provide supplemental O2 to maintain SpO2 >89% with normal WOB - repeat CXR in Natasha Dickson few weeks   Acute right brachial/axillary DVT, acute right cephalic and left cephalic SVT: In the setting of malignancy, remains hypercoagulable.  - continue lovenox -> transition to xarelto - monitor  Hb on anticoagulation   Persistent AFib with RVR:  - CHA2DS2-VASc score is 5. Anticoagulation as above - Rates improved with  digoxin. BP normalizing over past 24 hours. - Continue amiodarone, converted IV > PO 12/22. - appreciate cardiology recommendations, dig/amio per cards - amiodarone 200 mg BID x2 weeks (until 06/15/2022), then daily on 1/7.  Dig d/c'd.  Metoprolol, uptitrate as tolerated.   Symptomatic iron deficiency, acute on chronic blood loss anemia: Ferritin is 7, iron 15, 3% sat, TIBC up to 535. Suspect thrombocytosis is reactive to this. s/p 1u PRBC 12/22. - IV iron given, stopped oral iron with dark stools complicating ability to discern GI bleeding. Stools now [place]. - Hgb fluctuating, will follow    Vitamin B12 deficiency: Level is 166. - IM supplemented daily x7 days, now weekly   HLD: CK low.  - Statin   Stage IIIa CKD: Based on available CrCl values.  - Avoid nephrotoxins as able.  - CrCl continues improvement   Obesity: Body mass index is 39.11 kg/m.    Hypokalemia: replace and follow  Central line in place, R IJ 12/24.  Discontinued 1/1.  She doesn't have access now.    DVT prophylaxis: lovenox Code Status: full code Family Communication: husband, daughter Natasha Dickson Disposition:   Status is: Inpatient Remains inpatient appropriate because: need for post op care - she really wants to return home after discharge - doesn't seem particularly interested in rehab - will discuss with family  --- like d/c with home health   Consultants:  Cardiology Surgery GI  Procedures:  12/26 LAPAROSCOPIC HAND ASSISTED RIGHT COLECTOMY   12/23 Summary:    Right:  Findings consistent with acute deep vein thrombosis involving the right  axillary  vein and right brachial veins. Findings consistent with acute superficial  vein  thrombosis involving the right basilic vein.    Left:  Findings consistent with acute superficial vein thrombosis involving the  left  cephalic vein.   EGD - Preparation of the colon was fair, but adequate for the purposes of this study. - Malignant completely  obstructing tumor in the cecum and at the ileocecal valve. Biopsied. Tattooed. - Two 4 to 5 mm polyps in the descending colon and in the transverse colon, removed with [name] cold snare. Resected and retrieved. - One 8 mm polyp in the sigmoid colon, removed with Nayshawn Mesta cold snare. Resected and retrieved. - Diverticulosis in the sigmoid colon. - Non-bleeding internal hemorrhoids. - Stool in the sigmoid colon and in the descending colon. recommendation - Return patient to hospital ward for ongoing care. - Clear liquid diet today pending surgical evaluation. If no plan for inpatient surgery, will advance diet. - Continue present medications. - Await pathology results. - Consult inpatient General Surgery - Consult inpatient General Surgery/Colorectal Surgery today. - CT abdomen/pelvis for staging. Completed CT chest on 05/27/2022. - CEA. - Ok to resume heparin gtt. - IV iron on this admission. - Continue B12 and folate. - Continue serial CBC checks.  EGD - Normal esophagus. - 3 cm hiatal hernia. - Gastritis. Biopsied. - Normal mucosa was found in the gastric fundus and in the gastric body. Biopsied. - Cortana Vanderford few gastric polyps. - Normal examined duodenum. Biopsied. Impression: - Perform Natania Finigan colonoscopy today. - Will follow-up on pathology results. - Additional accommodations pending colonoscopy findings.  Antimicrobials:  Anti-infectives (From admission, onward)    Start     Dose/Rate Route Frequency Ordered Stop   06/07/22 2200  oseltamivir (TAMIFLU) capsule 30 mg  See Hyperspace for full Linked Orders Report.   30 mg Oral 2 times daily 06/07/22 1455 06/12/22 0959   06/07/22 1545  oseltamivir (TAMIFLU) capsule 75 mg       See Hyperspace for full Linked Orders Report.   75 mg Oral  Once 06/07/22 1455 06/07/22 1616   06/04/22 0600  cefoTEtan (CEFOTAN) 2 g in sodium chloride 0.9 % 100 mL IVPB  Status:  Discontinued        2 g 200 mL/hr over 30 Minutes Intravenous On call to O.R.  06/03/22 9485 06/04/22 0956   06/03/22 1400  neomycin (MYCIFRADIN) tablet 1,000 mg       See Hyperspace for full Linked Orders Report.   1,000 mg Oral 3 times per day 06/03/22 0841 06/03/22 2159   06/03/22 1400  metroNIDAZOLE (FLAGYL) tablet 1,000 mg       See Hyperspace for full Linked Orders Report.   1,000 mg Oral 3 times per day 06/03/22 0841 06/03/22 2116   05/31/22 0600  cefoTEtan (CEFOTAN) 2 g in sodium chloride 0.9 % 100 mL IVPB        2 g 200 mL/hr over 30 Minutes Intravenous On call to O.R. 05/30/22 1716 06/01/22 0559   05/30/22 2200  neomycin (MYCIFRADIN) tablet 1,000 mg       See Hyperspace for full Linked Orders Report.   1,000 mg Oral 3 times per day 05/30/22 1716 05/30/22 2238   05/30/22 2200  metroNIDAZOLE (FLAGYL) tablet 1,000 mg       See Hyperspace for full Linked Orders Report.   1,000 mg Oral 3 times per day 05/30/22 1716 05/30/22 2128       Subjective: No complaints Doesn't want to go to SNF (we had extended conversation, I tried to convince her)  Objective: Vitals:   06/09/22 2025 06/10/22 0456 06/10/22 0903 06/10/22 1603  BP: (!) 114/45  (!) 115/46 (!) 105/52  Pulse: 66  66 63  Resp: '16 19 20 14  '$ Temp: 97.7 F (36.5 C) 98.1 F (36.7 C) 97.8 F (36.6 C) 97.7 F (36.5 C)  TempSrc: Axillary Oral Oral Oral  SpO2: 100% 93% 98% 97%  Weight:  92.5 kg    Height:        Intake/Output Summary (Last 24 hours) at 06/10/2022 1735 Last data filed at 06/10/2022 0500 Gross per 24 hour  Intake 240 ml  Output 300 ml  Net -60 ml   Filed Weights   06/08/22 0351 06/09/22 0548 06/10/22 0456  Weight: 91.8 kg 90.7 kg 92.5 kg    Examination:  General: No acute distress. Cardiovascular: RRR Lungs: unlabored Abdomen: Soft, nontender, nondistended Neurological: Alert and oriented 3. Moves all extremities 4 with equal strength. Cranial nerves II through XII grossly intact. Extremities: No clubbing or cyanosis. No edema.  Data Reviewed: I have personally reviewed  following labs and imaging studies  CBC: Recent Labs  Lab 06/05/22 0357 06/05/22 2030 06/06/22 0443 06/07/22 0321 06/08/22 0450 06/09/22 0950  WBC 11.4*  --  10.7* 6.8 6.6 7.7  NEUTROABS  --   --   --  4.9 4.6 5.0  HGB 8.1* 8.4* 8.5* 8.9* 8.2* 8.2*  HCT 26.1* 26.8* 28.5* 30.5* 28.8* 28.6*  MCV 81.1  --  82.4 83.3 86.5 87.2  PLT 221  --  245 266 261 462    Basic Metabolic Panel: Recent Labs  Lab 06/05/22 0357 06/06/22 0443 06/07/22 0321 06/08/22 0450 06/09/22 0950  NA 135 136 140 140 134*  K 4.2  3.5 3.3* 3.5 3.7  CL 106 103 102 103 103  CO2 '23 26 27 28 28  '$ GLUCOSE 110* 94 90 85 89  BUN '9 11 10 12 13  '$ CREATININE 0.91 0.95 1.02* 1.06* 0.98  CALCIUM 7.9* 7.9* 8.0* 7.8* 7.4*  MG  --   --  2.1 2.4 2.2  PHOS  --   --  3.2 3.9 3.2    GFR: Estimated Creatinine Clearance: 50.7 mL/min (by C-G formula based on SCr of 0.98 mg/dL).  Liver Function Tests: Recent Labs  Lab 06/07/22 0321 06/08/22 0450 06/09/22 0950  AST '31 30 26  '$ ALT '17 14 16  '$ ALKPHOS 50 45 46  BILITOT 0.6 0.6 0.5  PROT 5.6* 5.2* 5.3*  ALBUMIN 2.5* 2.3* 2.2*    CBG: No results for input(s): "GLUCAP" in the last 168 hours.   Recent Results (from the past 240 hour(s))  Surgical pcr screen     Status: Abnormal   Collection Time: 06/03/22  8:12 PM   Specimen: Nasal Mucosa; Nasal Swab  Result Value Ref Range Status   MRSA, PCR NEGATIVE NEGATIVE Final   Staphylococcus aureus POSITIVE (Aniqua Briere) NEGATIVE Final    Comment: (NOTE) The Xpert SA Assay (FDA approved for NASAL specimens in patients 67 years of age and older), is one component of Talea Manges comprehensive surveillance program. It is not intended to diagnose infection nor to guide or monitor treatment. Performed at [facility], [place] [address], [place], [place] [address]   Resp panel by RT-PCR (RSV, Flu Shuan Statzer&B, Covid) Anterior Nasal Swab     Status: Abnormal   Collection Time: 06/07/22 10:09 AM   Specimen: Anterior Nasal Swab  Result Value Ref Range  Status   SARS Coronavirus 2 by RT PCR NEGATIVE NEGATIVE Final    Comment: (NOTE) SARS-CoV-2 target nucleic acids are NOT DETECTED.  The SARS-CoV-2 RNA is generally detectable in upper respiratory specimens during the acute phase of infection. The lowest concentration of SARS-CoV-2 viral copies this assay can detect is 138 copies/mL. Cheskel Silverio negative result does not preclude SARS-Cov-2 infection and should not be used as the sole basis for treatment or other patient management decisions. Ozil Stettler negative result may occur with  improper specimen collection/handling, submission of specimen other than nasopharyngeal swab, presence of viral mutation(s) within the areas targeted by this assay, and inadequate number of viral copies(<138 copies/mL). Sharlena Kristensen negative result must be combined with clinical observations, patient history, and epidemiological information. The expected result is Negative.  Fact Sheet for Patients:  EntrepreneurPulse.com.au  Fact Sheet for Healthcare Providers:  IncredibleEmployment.be  This test is no t yet approved or cleared by the [place] FDA and  has been authorized for detection and/or diagnosis of SARS-CoV-2 by FDA under an Emergency Use Authorization (EUA). This EUA will remain  in effect (meaning this test can be used) for the duration of the COVID-19 declaration under Section 564(b)(1) of the Act, 21 U.S.C.section 360bbb-3(b)(1), unless the authorization is terminated  or revoked sooner.       Influenza Lilas [place]ief[place]f by PCR NEGATIVE NEGATIVE Final   Influenza B by PCR POSITIVE (Treveon Bourcier) NEGATIVE Final    Comment: (NOTE) The Xpert Xpress SARS-CoV-2/FLU/RSV plus assay is intended as an aid in the diagnosis of influenza from Nasopharyngeal swab specimens and should not be used as Dawson Albers sole basis for treatment. Nasal washings and aspirates are unacceptable for Xpert Xpress SARS-CoV-2/FLU/RSV testing.  Fact Sheet for  Patients: EntrepreneurPulse.com.au  Fact Sheet for Healthcare Providers: IncredibleEmployment.be  This test  is not yet approved or cleared by the [place] and has been authorized for detection and/or diagnosis of SARS-CoV-2 by FDA under an Emergency Use Authorization (EUA). This EUA will remain in effect (meaning this test can be used) for the duration of the COVID-19 declaration under Section 564(b)(1) of the Act, 21 U.S.C. section 360bbb-3(b)(1), unless the authorization is terminated or revoked.     Resp Syncytial Virus by PCR NEGATIVE NEGATIVE Final    Comment: (NOTE) Fact Sheet for Patients: EntrepreneurPulse.com.au  Fact Sheet for Healthcare Providers: IncredibleEmployment.be  This test is not yet approved or cleared by the [place] FDA and has been authorized for detection and/or diagnosis of SARS-CoV-2 by FDA under an Emergency Use Authorization (EUA). This EUA will remain in effect (meaning this test can be used) for the duration of the COVID-19 declaration under Section 564(b)(1) of the Act, 21 U.S.C. section 360bbb-3(b)(1), unless the authorization is terminated or revoked.  Performed at [facility], [place] [address] [place],  [address]          Radiology Studies: No results found.      Scheduled Meds:  acetaminophen  650 mg Oral Q6H   alteplase  2 mg Intracatheter Once   alteplase  2 mg Intracatheter Once   alvimopan  12 mg Oral BID   amiodarone  200 mg Oral BID   atorvastatin  10 mg Oral Daily   Chlorhexidine Gluconate Cloth  6 each Topical Daily   cyanocobalamin  1,000 mcg Intramuscular Q Sun   feeding supplement  237 mL Oral TID BM   folic acid  1 mg Oral Daily   furosemide  20 mg Oral Daily   gabapentin  300 mg Oral TID   metoprolol succinate  25 mg Oral Daily   oseltamivir  30 mg Oral BID   pantoprazole  40 mg Oral Daily   rivaroxaban  15 mg  Oral BID WC   sodium chloride flush  10-40 mL Intracatheter Q12H   Continuous Infusions:     LOS: 14 days    Time spent: over 30 min    Fayrene Helper, MD [facility]   To contact the attending provider between 7A-7P or the covering provider during after hours 7P-7A, please log into the web site [email or web address] and access using universal [place] password for that web site. If you do not have the password, please call the hospital operator.  06/10/2022, 5:35 PM

## 2022-06-10 NOTE — TOC Progression Note (Signed)
Transition of Care [facility]) - Progression Note    Patient Details  Name: Natasha Dickson MRN: [id number] Date of Birth: [birth year]  Transition of Care [facility]) CM/SW Contact  Graves-Bigelow, Ocie Cornfield, RN Phone Number: 06/10/2022, 3:31 PM  Clinical Narrative: Case Manager spoke with patients spouse and the patient is not agreeable to SNF. Plan will be to return home with home health services. Spouse wants to use [place] for [facility]: RN, PT,OT, Aide, and CSW services- order to be written. Alvis Lemmings will service the patient within 24-48 hours post transition home. DME orders for RW and BSC submitted to [employer]-will be delivered to the room prior to transition home. Case Manager will provide patient with brochures for personal care agencies as well. Case Manager will continue to follow for transition of care needs.   Expected Discharge Plan: [place] Barriers to Discharge: No Barriers Identified  Expected Discharge Plan and Services In-house Referral: NA Discharge Planning Services: CM Consult Post Acute Care Choice: [place] arrangements for the past 2 months: Single Family Home                 DME Arranged: Walker rolling, Bedside commode DME Agency: [employer] Date DME Agency Contacted: 06/10/22 Time DME Agency Contacted: [phone]   [place]: RN, Disease Management, PT, OT, Nurse's Aide, Social Work [employer] Agency: [place] Date [place]: 06/10/22 Time [place]: [phone] Representative spoke with at [place]: [place] Determinants of Health ([place]) Interventions SDOH Screenings   Food Insecurity: No Food Insecurity (10/24/2021)  Housing: Low Risk  (10/24/2021)  Transportation Needs: No Transportation Needs (10/24/2021)  Alcohol Screen: Low Risk  (10/24/2021)  Depression (PHQ2-9): Low Risk  (10/24/2021)  Financial Resource Strain: Low Risk  (10/24/2021)  Physical Activity: Inactive (10/24/2021)  Social Connections:  Moderately Isolated (10/24/2021)  Stress: No Stress Concern Present (10/24/2021)  Tobacco Use: Low Risk  (06/05/2022)    Readmission Risk Interventions     No data to display

## 2022-06-10 NOTE — Progress Notes (Signed)
Consult for PICC line removal: Pt has sutured IJ CVC. Sarah, RN informed that she can remove line.

## 2022-06-10 NOTE — TOC Progression Note (Signed)
Transition of Care [facility]) - Progression Note    Patient Details  Name: [name] MRN: [id number] Date of Birth: 1946-04-30  Transition of Care [facility]) CM/SW Contact  Graves-Bigelow, Ocie Cornfield, RN Phone Number: 06/10/2022, 1:42 PM  Clinical Narrative:   Case Manager spoke with spouse and he feels that the patient will need SNF prior to discharge. Spouse is asking that the CSW look into [facility] to see if they have a bed available-CSW is aware. Case Manager will continue to follow for disposition needs.   Expected Discharge Plan: [place] Barriers to Discharge: Continued Medical Work up  Expected Discharge Plan and Services In-house Referral: NA Discharge Planning Services: CM Consult Post Acute Care Choice: [place] arrangements for the past 2 months: Single Family Home                   DME Agency: NA       HH Arranged: PT, OT  Social Determinants of Health (SDOH) Interventions SDOH Screenings   Food Insecurity: No Food Insecurity (10/24/2021)  Housing: Low Risk  (10/24/2021)  Transportation Needs: No Transportation Needs (10/24/2021)  Alcohol Screen: Low Risk  (10/24/2021)  Depression (PHQ2-9): Low Risk  (10/24/2021)  Financial Resource Strain: Low Risk  (10/24/2021)  Physical Activity: Inactive (10/24/2021)  Social Connections: Moderately Isolated (10/24/2021)  Stress: No Stress Concern Present (10/24/2021)  Tobacco Use: Low Risk  (06/05/2022)    Readmission Risk Interventions     No data to display

## 2022-06-10 NOTE — Progress Notes (Signed)
6 Days Post-Op   Subjective/Chief Complaint: Looks and feels better.  No longer short of breath and off oxygen.  Bowels are moving and tolerating diet.  No abdominal pain.   Objective: Vital signs in last 24 hours: Temp:  [97.6 F (36.4 C)-98.2 F (36.8 C)] 97.8 F (36.6 C) (01/01 0903) Pulse Rate:  [65-79] 66 (01/01 0903) Resp:  [15-20] 20 (01/01 0903) BP: (114-139)/(45-68) 115/46 (01/01 0903) SpO2:  [93 %-100 %] 98 % (01/01 0903) Weight:  [92.5 kg] 92.5 kg (01/01 0456) Last BM Date : 06/09/22  Intake/Output from previous day: 12/31 0701 - 01/01 0700 In: 960 [P.O.:960] Out: 300 [Urine:300] Intake/Output this shift: No intake/output data recorded.  GI: Incision clean dry intact.  Soft nontender.  Lab Results:  Recent Labs    06/08/22 0450 06/09/22 0950  WBC 6.6 7.7  HGB 8.2* 8.2*  HCT 28.8* 28.6*  PLT 261 292   BMET Recent Labs    06/08/22 0450 06/09/22 0950  NA 140 134*  K 3.5 3.7  CL 103 103  CO2 28 28  GLUCOSE 85 89  BUN 12 13  CREATININE 1.06* 0.98  CALCIUM 7.8* 7.4*   PT/INR No results for input(s): "LABPROT", "INR" in the last 72 hours. ABG No results for input(s): "PHART", "HCO3" in the last 72 hours.  Invalid input(s): "PCO2", "PO2"  Studies/Results: No results found.  Anti-infectives: Anti-infectives (From admission, onward)    Start     Dose/Rate Route Frequency Ordered Stop   06/07/22 2200  oseltamivir (TAMIFLU) capsule 30 mg       See Hyperspace for full Linked Orders Report.   30 mg Oral 2 times daily 06/07/22 1455 06/12/22 0959   06/07/22 1545  oseltamivir (TAMIFLU) capsule 75 mg       See Hyperspace for full Linked Orders Report.   75 mg Oral  Once 06/07/22 1455 06/07/22 1616   06/04/22 0600  cefoTEtan (CEFOTAN) 2 g in sodium chloride 0.9 % 100 mL IVPB  Status:  Discontinued        2 g 200 mL/hr over 30 Minutes Intravenous On call to O.R. 06/03/22 6433 06/04/22 0956   06/03/22 1400  neomycin (MYCIFRADIN) tablet 1,000 mg        See Hyperspace for full Linked Orders Report.   1,000 mg Oral 3 times per day 06/03/22 0841 06/03/22 2159   06/03/22 1400  metroNIDAZOLE (FLAGYL) tablet 1,000 mg       See Hyperspace for full Linked Orders Report.   1,000 mg Oral 3 times per day 06/03/22 0841 06/03/22 2116   05/31/22 0600  cefoTEtan (CEFOTAN) 2 g in sodium chloride 0.9 % 100 mL IVPB        2 g 200 mL/hr over 30 Minutes Intravenous On call to O.R. 05/30/22 1716 06/01/22 0559   05/30/22 2200  neomycin (MYCIFRADIN) tablet 1,000 mg       See Hyperspace for full Linked Orders Report.   1,000 mg Oral 3 times per day 05/30/22 1716 05/30/22 2238   05/30/22 2200  metroNIDAZOLE (FLAGYL) tablet 1,000 mg       See Hyperspace for full Linked Orders Report.   1,000 mg Oral 3 times per day 05/30/22 1716 05/30/22 2128       Assessment/Plan: s/p Procedure(s): LAPAROSCOPIC HAND ASSISTED RIGHT COLECTOMY (Right) Stable for discharge when medically fit  Follow-up with oncology and surgery already in chart  May resume any anticoagulation as needed as an outpatient  LOS: 14 days  Joyice Faster Zorana Brockwell MD  06/10/2022

## 2022-06-11 ENCOUNTER — Inpatient Hospital Stay (HOSPITAL_COMMUNITY): Payer: Medicare Other

## 2022-06-11 DIAGNOSIS — I4891 Unspecified atrial fibrillation: Secondary | ICD-10-CM | POA: Diagnosis not present

## 2022-06-11 NOTE — Care Management Important Message (Signed)
Important Message  Patient Details  Name: Natasha Dickson MRN: [id number] Date of Birth: [birth year]   Medicare Important Message Given:  Yes     Shelda Altes 06/11/2022, 12:22 PM

## 2022-06-11 NOTE — Progress Notes (Signed)
7 Days Post-Op   Subjective/Chief Complaint: Sitting up eating breakfast. Denies abd pain. Tolerating PO. Reports a BM last night. States she is hoping to be discharged today.  Objective: Vital signs in last 24 hours: Temp:  [97.7 F (36.5 C)-98.2 F (36.8 C)] 97.9 F (36.6 C) (01/02 0436) Pulse Rate:  [63-73] 73 (01/02 0436) Resp:  [12-20] 17 (01/02 0436) BP: (105-138)/(46-73) 138/73 (01/02 0436) SpO2:  [95 %-99 %] 95 % (01/02 0436) Weight:  [92.8 kg] 92.8 kg (01/02 0436) Last BM Date : 06/09/22  Intake/Output from previous day: 01/01 0701 - 01/02 0700 In: -  Out: 500 [Urine:500] Intake/Output this shift: No intake/output data recorded.  GI: Incision clean dry intact.  Soft nontender.  Lab Results:  Recent Labs    06/09/22 0950  WBC 7.7  HGB 8.2*  HCT 28.6*  PLT 292   BMET Recent Labs    06/09/22 0950  NA 134*  K 3.7  CL 103  CO2 28  GLUCOSE 89  BUN 13  CREATININE 0.98  CALCIUM 7.4*   PT/INR No results for input(s): "LABPROT", "INR" in the last 72 hours. ABG No results for input(s): "PHART", "HCO3" in the last 72 hours.  Invalid input(s): "PCO2", "PO2"  Studies/Results: No results found.  Anti-infectives: Anti-infectives (From admission, onward)    Start     Dose/Rate Route Frequency Ordered Stop   06/07/22 2200  oseltamivir (TAMIFLU) capsule 30 mg       See Hyperspace for full Linked Orders Report.   30 mg Oral 2 times daily 06/07/22 1455 06/12/22 0959   06/07/22 1545  oseltamivir (TAMIFLU) capsule 75 mg       See Hyperspace for full Linked Orders Report.   75 mg Oral  Once 06/07/22 1455 06/07/22 1616   06/04/22 0600  cefoTEtan (CEFOTAN) 2 g in sodium chloride 0.9 % 100 mL IVPB  Status:  Discontinued        2 g 200 mL/hr over 30 Minutes Intravenous On call to O.R. 06/03/22 0102 06/04/22 0956   06/03/22 1400  neomycin (MYCIFRADIN) tablet 1,000 mg       See Hyperspace for full Linked Orders Report.   1,000 mg Oral 3 times per day 06/03/22 0841  06/03/22 2159   06/03/22 1400  metroNIDAZOLE (FLAGYL) tablet 1,000 mg       See Hyperspace for full Linked Orders Report.   1,000 mg Oral 3 times per day 06/03/22 0841 06/03/22 2116   05/31/22 0600  cefoTEtan (CEFOTAN) 2 g in sodium chloride 0.9 % 100 mL IVPB        2 g 200 mL/hr over 30 Minutes Intravenous On call to O.R. 05/30/22 1716 06/01/22 0559   05/30/22 2200  neomycin (MYCIFRADIN) tablet 1,000 mg       See Hyperspace for full Linked Orders Report.   1,000 mg Oral 3 times per day 05/30/22 1716 05/30/22 2238   05/30/22 2200  metroNIDAZOLE (FLAGYL) tablet 1,000 mg       See Hyperspace for full Linked Orders Report.   1,000 mg Oral 3 times per day 05/30/22 1716 05/30/22 2128       Assessment/Plan: s/p Procedure(s): LAPAROSCOPIC HAND ASSISTED RIGHT COLECTOMY (Right) Pain controlled, tolerating PO, having bowel function. Stable for discharge when medically fit. Stop entereg now that bowel function has returned   Follow-up with oncology and surgery have been arranged   FEN: SOFT ID: none VTE: Xarelto for afib, this was resumed 12/30 (hgb stable at 8.2) Foley: none  LOS: 15 days    Jill Alexanders PA-C  06/11/2022

## 2022-06-11 NOTE — NC FL2 (Signed)
New Site LEVEL OF CARE FORM     IDENTIFICATION  Patient Name: Natasha Dickson Birthdate: Dec 12, 1945 Sex: female Admission Date (Current Location): 05/27/2022  [facility] and [place] Number:  Herbalist and Address:  [facility], [place] [address], [place], [place] [address]      Provider Number: [id number]  Attending Physician Name and Address:  Elodia Florence., *  Relative Name and Phone Number:  Shanon Brow (Spouse) [phone]    Current Level of Care: Hospital Recommended Level of Care: [place] Prior Approval Number:    Date Approved/Denied:   PASRR Number: [id number]  Discharge Plan: SNF    Current Diagnoses: Patient Active Problem List   Diagnosis Date Noted   Colonic mass 05/29/2022   Iron deficiency anemia due to chronic blood loss 05/29/2022   Diverticulosis of colon without hemorrhage 05/29/2022   Adenomatous polyp of transverse colon 05/29/2022   Adenomatous polyp of descending colon 05/29/2022   Adenomatous polyp of sigmoid colon 05/29/2022   Gastritis and gastroduodenitis 05/29/2022   Hiatal hernia 05/29/2022   Microcytic anemia 05/28/2022   Fecal occult blood test positive 05/28/2022   Atrial fibrillation with RVR ([place]) 05/27/2022   B12 deficiency 05/27/2022   Weakness 05/27/2022   (HFpEF) heart failure with preserved ejection fraction ([place]) 05/27/2022   Edema 05/19/2022   Muscle pain 05/19/2022   Tremor 05/19/2022   Memory change 05/19/2022   Lower back pain 01/01/2021   Pain in right hip 01/20/2020   Pain with hip hemiarthroplasty ([place]) 12/29/2019   Sciatica 10/03/2017   Elevated serum creatinine 02/12/2017   Advance care planning 03/15/2016   Skin lesion 03/15/2016   Fall at home 03/15/2016   Healthcare maintenance 03/15/2016   Cough 03/21/2015   Morbid obesity ([place]) 10/21/2014   Chronic combined systolic and diastolic CHF, NYHA class 2 ([place]) 10/21/2014   CKD (chronic kidney  disease) 10/21/2014   Weight loss 07/20/2013   Appetite loss 07/20/2013   Hypokalemia 02/23/2013   Nonischemic cardiomyopathy ([place])    A-fib ([place]) 02/19/2013   Congestive dilated cardiomyopathy ([place]) 02/19/2013   Acute combined systolic and diastolic heart failure ([place]) 02/18/2013   SOB (shortness of breath) 02/15/2013   Hip fracture ([place]) 03/27/2011   Abnormal Pap smear of cervix 03/27/2011   Hyperlipidemia 03/27/2011    Orientation RESPIRATION BLADDER Height & Weight     Self, Time, Situation, Place  Normal Continent, External catheter (External Urinary Catheter) Weight: 204 lb 9.4 oz (92.8 kg) Height:  '5\' 1"'$  (154.9 cm)  BEHAVIORAL SYMPTOMS/MOOD NEUROLOGICAL BOWEL NUTRITION STATUS      Continent Diet (Please see discharge summary)  AMBULATORY STATUS COMMUNICATION OF NEEDS Skin   Limited Assist Verbally Other (Comment) (Appropriate for ethnicity,dry,Erythema,perineum,bilateral,wound/Incision LDAs,Incision closed,abdomen,other,liquid skin adhesive)                       Personal Care Assistance Level of Assistance  Bathing, Feeding, Dressing Bathing Assistance: Limited assistance Feeding assistance: Independent Dressing Assistance: Limited assistance     Functional Limitations Info  Sight, Hearing, Speech Sight Info: Impaired Hearing Info: Adequate Speech Info: Adequate    SPECIAL CARE FACTORS FREQUENCY  PT (By licensed PT), OT (By licensed OT)     PT Frequency: 5x min weekly OT Frequency: 5x min weekly            Contractures Contractures Info: Not present    Additional Factors Info  Code Status, Allergies, Isolation Precautions Code Status  Info: FULL Allergies Info: NKA     Isolation Precautions Info: Droplet precaution added 06/07/2022     Current Medications (06/11/2022):  This is the current hospital active medication list Current Facility-Administered Medications  Medication Dose Route Frequency Provider Last Rate Last Admin   acetaminophen  (TYLENOL) tablet 650 mg  650 mg Oral Q6H PRN Stechschulte, Nickola Major, MD   650 mg at 06/05/22 3716   Or   acetaminophen (TYLENOL) suppository 650 mg  650 mg Rectal Q6H PRN Stechschulte, Nickola Major, MD       acetaminophen (TYLENOL) tablet 650 mg  650 mg Oral Q6H Stechschulte, Nickola Major, MD   650 mg at 06/11/22 9678   alteplase (CATHFLO ACTIVASE) injection 2 mg  2 mg Intracatheter Once Stechschulte, Nickola Major, MD       alteplase (CATHFLO ACTIVASE) injection 2 mg  2 mg Intracatheter Once Stechschulte, Nickola Major, MD       amiodarone (PACERONE) tablet 200 mg  200 mg Oral BID Stechschulte, Nickola Major, MD   200 mg at 06/11/22 1107   atorvastatin (LIPITOR) tablet 10 mg  10 mg Oral Daily Stechschulte, Nickola Major, MD   10 mg at 06/11/22 1107   Chlorhexidine Gluconate Cloth 2 % PADS 6 each  6 each Topical Daily Stechschulte, Nickola Major, MD   6 each at 06/11/22 1108   cyanocobalamin (VITAMIN B12) injection 1,000 mcg  1,000 mcg Intramuscular Q Sun Stechschulte, Nickola Major, MD   1,000 mcg at 06/09/22 0935   feeding supplement (ENSURE ENLIVE / ENSURE PLUS) liquid 237 mL  237 mL Oral TID BM Stechschulte, Nickola Major, MD   237 mL at 93/81/01 7510   folic acid (FOLVITE) tablet 1 mg  1 mg Oral Daily Stechschulte, Nickola Major, MD   1 mg at 06/11/22 1107   furosemide (LASIX) tablet 20 mg  20 mg Oral Daily Cherlynn Kaiser A, MD   20 mg at 06/11/22 1107   gabapentin (NEURONTIN) capsule 300 mg  300 mg Oral TID Elodia Florence., MD   300 mg at 06/11/22 1106   guaiFENesin-dextromethorphan (ROBITUSSIN DM) 100-10 MG/5ML syrup 5 mL  5 mL Oral Q4H PRN [name], MD   5 mL at 06/08/22 0936   HYDROmorphone (DILAUDID) injection 0.5 mg  0.5 mg Intravenous Q3H PRN Stechschulte, Nickola Major, MD       loperamide (IMODIUM) capsule 2 mg  2 mg Oral PRN Elodia Florence., MD   2 mg at 06/09/22 2237   metoprolol succinate (TOPROL-XL) 24 hr tablet 25 mg  25 mg Oral Daily Cherlynn Kaiser A, MD   25 mg at 06/11/22 1107   metoprolol tartrate (LOPRESSOR) tablet 12.5 mg   12.5 mg Oral TID PRN Stechschulte, Nickola Major, MD       ondansetron [facility]) injection 4 mg  4 mg Intravenous Q6H PRN Stechschulte, Nickola Major, MD   4 mg at 06/05/22 1213   Oral care mouth rinse  15 mL Mouth Rinse PRN Elodia Florence., MD       oseltamivir (TAMIFLU) capsule 30 mg  30 mg Oral BID Elodia Florence., MD   30 mg at 06/11/22 1106   oxyCODONE (Oxy IR/ROXICODONE) immediate release tablet 10 mg  10 mg Oral Q4H PRN Stechschulte, Nickola Major, MD       oxyCODONE (Oxy IR/ROXICODONE) immediate release tablet 5 mg  5 mg Oral Q4H PRN Stechschulte, Nickola Major, MD   5 mg at 06/04/22 1650   pantoprazole (  PROTONIX) EC tablet 40 mg  40 mg Oral Daily Elodia Florence., MD   40 mg at 06/11/22 1107   prochlorperazine (COMPAZINE) injection 10 mg  10 mg Intravenous Q4H PRN Stechschulte, Nickola Major, MD       Rivaroxaban Alveda Reasons) tablet 15 mg  15 mg Oral BID WC Francena Hanly, RPH   15 mg at 06/11/22 0815   simethicone (MYLICON) chewable tablet 80 mg  80 mg Oral QID PRN Stechschulte, Nickola Major, MD   80 mg at 06/05/22 1428     Discharge Medications: Please see discharge summary for a list of discharge medications.  Relevant Imaging Results:  Relevant Lab Results:   Additional Information [phone]  Milas Gain, LCSWA

## 2022-06-11 NOTE — TOC Progression Note (Signed)
Transition of Care [facility]) - Progression Note    Patient Details  Name: Natasha Dickson MRN: [id number] Date of Birth: 07/05/1945  Transition of Care [facility]) CM/SW Contact  Graves-Bigelow, Ocie Cornfield, RN Phone Number: 06/11/2022, 10:03 AM  Clinical Narrative: Case Manager received a call from the spouse that the patient is now agreeable to SNF [facility]. CSW to speak with patient to see if agreeable and check on bed availability. Case Manager did call [employer] to cancel DME if it hasn't been delivered. No further needs identified at this time.   Expected Discharge Plan: Skilled Nursing Facility Barriers to Discharge: No Barriers Identified  Expected Discharge Plan and Services In-house Referral: NA Discharge Planning Services: CM Consult Post Acute Care Choice: [place] arrangements for the past 2 months: Single Family Home                 DME Arranged: Walker rolling, Bedside commode DME Agency: [employer] Date DME Agency Contacted: 06/10/22 Time DME Agency Contacted: 58   HH Arranged: RN, Disease Management, PT, OT, Nurse's Aide, Social Work [employer] Agency: [place] Date [place]: 06/10/22 Time [place]: 1527 Representative spoke with at [place]: [employer] Determinants of Health ([place]) Interventions SDOH Screenings   Food Insecurity: No Food Insecurity (10/24/2021)  Housing: Low Risk  (10/24/2021)  Transportation Needs: No Transportation Needs (10/24/2021)  Alcohol Screen: Low Risk  (10/24/2021)  Depression (PHQ2-9): Low Risk  (10/24/2021)  Financial Resource Strain: Low Risk  (10/24/2021)  Physical Activity: Inactive (10/24/2021)  Social Connections: Moderately Isolated (10/24/2021)  Stress: No Stress Concern Present (10/24/2021)  Tobacco Use: Low Risk  (06/05/2022)    Readmission Risk Interventions     No data to display

## 2022-06-11 NOTE — Progress Notes (Signed)
Mobility Specialist - Progress Note   06/11/22 1121  Orthostatic Lying   BP- Lying (!) 152/116  Orthostatic Standing at 0 minutes  BP- Standing at 0 minutes (!) 135/98  Mobility  Activity Ambulated with assistance in room  Level of Assistance Moderate assist, patient does 50-74%  Assistive Device Front wheel walker  Distance Ambulated (ft) 20 ft  Activity Response Tolerated fair  Mobility Referral Yes  $Mobility charge 1 Mobility   Pt was received in chair and agreeable to session. Pt requesting assistance to [facility] and was ModA throughout transfer. Pt with successful void. Pt was MaxA for pericare. Session was limited d/t pt fatiguing quickly. Pt was left in bed with all needs met and bed alarm on.   Franki Monte  Mobility Specialist Please contact via Solicitor or Rehab office at [phone]

## 2022-06-11 NOTE — TOC Progression Note (Addendum)
Transition of Care [facility]) - Progression Note    Patient Details  Name: Natasha Dickson MRN: [id number] Date of Birth: 07/22/1945  Transition of Care [facility]) CM/SW [place], [place] Phone Number: 06/11/2022, 10:34 AM  Clinical Narrative:     Update- CSW met with patients spouse and provided medicare compare SNF ratings list with accepted SNF bed offers.  Patients spouse chose SNF placement at [facility] and rehab for patient. CSW spoke with Freda Munro who informed CSW to follow up in the morning for bed availability. CSW will follow up in the morning on bed availability.CSW will continue to follow.   CSW informed patient now agreeable to SNF placement. CSW spoke with patient. Patient confirmed she is agreeable to SNF placement and gave CSW permission to fax out initial referral to [place] faxed over initial referral to [facility]. CSW awaiting call back from [place] with [facility] to see if they can offer SNF bed for patient. CSW will continue to follow and assist with patients dc planning needs.  Update- patient and patients spouse agreeable for CSW to fax referral to [place] area and [facility] in [place]. CSW awaiting SNF bed offers.   Expected Discharge Plan: Skilled Nursing Facility Barriers to Discharge: No Barriers Identified  Expected Discharge Plan and Services In-house Referral: NA Discharge Planning Services: CM Consult Post Acute Care Choice: [place] arrangements for the past 2 months: Single Family Home                 DME Arranged: Walker rolling, Bedside commode DME Agency: [employer] Date DME Agency Contacted: 06/10/22 Time DME Agency Contacted: 45   HH Arranged: RN, Disease Management, PT, OT, Nurse's Aide, Social Work [employer] Agency: [place] Date [place]: 06/10/22 Time [place]: 1527 Representative spoke with at [place]: [place] Determinants of Health ([place]) Interventions SDOH  Screenings   Food Insecurity: No Food Insecurity (10/24/2021)  Housing: Low Risk  (10/24/2021)  Transportation Needs: No Transportation Needs (10/24/2021)  Alcohol Screen: Low Risk  (10/24/2021)  Depression (PHQ2-9): Low Risk  (10/24/2021)  Financial Resource Strain: Low Risk  (10/24/2021)  Physical Activity: Inactive (10/24/2021)  Social Connections: Moderately Isolated (10/24/2021)  Stress: No Stress Concern Present (10/24/2021)  Tobacco Use: Low Risk  (06/05/2022)    Readmission Risk Interventions     No data to display

## 2022-06-11 NOTE — Progress Notes (Signed)
Physical Therapy Treatment Patient Details Name: Natasha Dickson MRN: [id number] DOB: 1946-02-14 Today's Date: 06/11/2022   History of Present Illness 77 y.o. female who presented to the ED 12/18 with hypotension and weakness. 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  Pt s/p laporoscopic R colectomy on 06/04/22.  PMH: A. Fib with RVR on Xarelto (last dose 12/18, currently on heparin gtt), CHF (30-35% 2014, now 55-60% in 04/2022), HTN, HLD, and CKD2.    PT Comments     Initial plan for stair training for discharge home, however pt limited in mobility by increased O2 demand, and increased HR with activity in presence of generalized weakness, and poor awareness of current physical limitations. Vitals stable in supine, and pt requiring min A for coming to EoB. Once EoB pt request use of BSC. Pt transfers 1x from bed to [facility] with minA and increase in HR to 140s, pt reports does not feel she needs to use BSC so transfers to recliner. Once sitting in recliner request to return to [facility]. Pt has BM, but also increase in HR, drop in SpO2 on RA (reading in 70%s O2 however poor pleth waveform) and period of decreased command follow while standing for maxA with pericare. Pt requires modA to return to recliner and 3L O2 via [place] to rebound SpO2 to 90s. Pt reports feeling much better with addition of supplemental O2.  Given pt response to cardiopulmonary response to self care activities, in presence of generalized weakness PT recommends short bout of SNF level rehab to improve strength and endurance prior to going home.     Recommendations for follow up therapy are one component of a multi-disciplinary discharge planning process, led by the attending physician.  Recommendations may be updated based on patient status, additional functional criteria and insurance authorization.  Follow Up Recommendations  Skilled nursing-short term rehab (<3 hours/day) Can patient physically be  transported by private vehicle: No   Assistance Recommended at Discharge Frequent or constant Supervision/Assistance  Patient can return home with the following A little help with bathing/dressing/bathroom;Assistance with cooking/housework;Direct supervision/assist for medications management;Assist for transportation;Help with stairs or ramp for entrance;A lot of help with walking and/or transfers   Equipment Recommendations  None recommended by PT       Precautions / Restrictions Precautions Precautions: Fall Precaution Comments: watch BP Restrictions Weight Bearing Restrictions: No     Mobility  Bed Mobility Overal bed mobility: Needs Assistance Bed Mobility: Supine to Sit     Supine to sit: Min assist     General bed mobility comments: min A for trunk elevation with HoB elevated, and for scooting to EoB.    Transfers Overall transfer level: Needs assistance Equipment used: Rolling walker (2 wheels) Transfers: Sit to/from Stand, Bed to chair/wheelchair/BSC Sit to Stand: Min assist   Step pivot transfers: Min assist, Mod assist       General transfer comment: min A for power up to RW, once standing pt reports need to have BM, able to pull BSC beside bed and providing min A for pivoting to BSC, pt in Afib with HR elevating into the 140s,  pt RA with SpO2 in 90%, pt reports she no longer feels like she needs to have BM, HR in 90s, pivot to recliner with min A, once in seated pt reports now she really needs to have BM, pivots back to [facility] with min A, HR elevates again to 140s, Pt successful with BM. Stood at [employer] for [employer],  pt with 3/4 DoE and drop in SpO2 to 70s with poor waveform. Provided vc for sequencing return to recliner, pt with poor command follow and no longer answering questions, requires modA for safely pivoting to recliner. Once safely in seated, provided pt with 3L O2 via [place], pt SpO2 in 80s with good waveform. With cues for purse lip breathing SpO2 rebounds to 90s  and pt able to answer questions. Reporting she is feeling much better.    Ambulation/Gait               General Gait Details: deferred due to drop in SpO2 and increase in HR in presence of overall fatigue from using BSC         Balance Overall balance assessment: Needs assistance Sitting-balance support: Feet supported, Bilateral upper extremity supported Sitting balance-Leahy Scale: Fair Sitting balance - Comments: close supervision EOB.   Standing balance support: Bilateral upper extremity supported, During functional activity Standing balance-Leahy Scale: Fair Standing balance comment: reliant on RW for support, able to let go with one hand to perform peri care after BSC.                            Cognition Arousal/Alertness: Awake/alert Behavior During Therapy: WFL for tasks assessed/performed, Flat affect Overall Cognitive Status: Within Functional Limits for tasks assessed                                 General Comments: flat affect, not really aware of how impared her mobility is, after not being able to ambulate to the door she states "I'll just have to do better next time." Despite plan for discharge today           General Comments General comments (skin integrity, edema, etc.): at rest on RA SpO2 93%O2, HR in 80s bpm, with getting up to BSCx2 pt with increase in HR Afib with max noted 146bpm, SpO2 dropped to at least mid 80s with good pleth waveform, pt with BP taken on R ankle in supine 142/86, unable to take in seated due to postional change and improper size cuff for use on arm      Pertinent Vitals/Pain Pain Assessment Pain Assessment: Faces Faces Pain Scale: Hurts a little bit Pain Location: generalized with movement Pain Descriptors / Indicators: Grimacing Pain Intervention(s): Limited activity within patient's tolerance, Monitored during session, Repositioned     PT Goals (current goals can now be found in the care plan  section) Acute Rehab PT Goals Patient Stated Goal: to go home and have therapy at home PT Goal Formulation: With patient Time For Goal Achievement: 06/19/22 Potential to Achieve Goals: Good Progress towards PT goals: Not progressing toward goals - comment    Frequency    Min 3X/week      PT Plan Discharge plan needs to be updated       AM-PAC PT "6 Clicks" Mobility   Outcome Measure  Help needed turning from your back to your side while in a flat bed without using bedrails?: A Little Help needed moving from lying on your back to sitting on the side of a flat bed without using bedrails?: A Little Help needed moving to and from a bed to a chair (including a wheelchair)?: A Little Help needed standing up from a chair using your arms (e.g., wheelchair or bedside chair)?: A Little Help needed to walk in  hospital room?: A Lot Help needed climbing 3-5 steps with a railing? : Total 6 Click Score: 15    End of Session Equipment Utilized During Treatment: Gait belt Activity Tolerance: Patient limited by fatigue;Treatment limited secondary to medical complications (Comment) (drop in SpO2 on RA) Patient left: in chair;with call bell/phone within reach;with chair alarm set Nurse Communication: Mobility status (drop in SpO2, increase in HR with pivot to [facility]) PT Visit Diagnosis: Muscle weakness (generalized) (M62.81);Difficulty in walking, not elsewhere classified (R26.2);Pain Pain - Right/Left:  (abdomen) Pain - part of body:  (abdomen)     Time: 3734-2876 PT Time Calculation (min) (ACUTE ONLY): 41 min  Charges:  $Therapeutic Activity: 38-52 mins                     Johnesha Acheampong B. Migdalia Dk PT, DPT Acute Rehabilitation Services Please use secure chat or  Call Office [phone]    [place] 06/11/2022, 11:33 AM

## 2022-06-11 NOTE — Progress Notes (Addendum)
PROGRESS NOTE    Natasha Dickson  WEX:[id number] DOB: 10/02/1945 DOA: 05/27/2022 PCP: Tonia Ghent, MD  Chief Complaint  Patient presents with   Weakness    Brief Narrative:  Natasha Dickson is Natasha Dickson 77 y.o. female with medical history significant of atrial fibrillation on xarelto, systolic HF (recovered EF), non ischemic cardiomyopathy, hypertension and multiple other medical issues here with progressive generalized weakness.  Found to have new anemia and + FOBT as well as AFib with RVR. She was admitted with cardiology consultations. GI took for EGD/colonoscopy 12/20 which showed Natasha Dickson "malignant completely obstructing tumor in the cecum and at the ileocecal valve," which has been found to be invasive adenocarcinoma. There were polyps in the transverse, descending, and sigmoid colon which were also sent to pathology ultimately ruling out for malignancy. CT abd/pelvis showed mesenteric LNs, no liver lesions. Rate control has been complicated by hypotension for which cardiology is consulted, maintaining marginal rate control with amiodarone with soft BP. She has received IV iron and 1u PRBCs 12/20 for progressive anemia and had heparin held due to this. Due to continued difficulty with venous access, PICC line placed, subsequently developed DVT of right brachial and axillary veins as well as SVT of bilateral cephalic veins for which therapeutic anticoagulation was reinitiated and PICC discontinued, right IJ inserted 12/24. Surgery performed hemicolectomy 12/26.   Path with invasive adenocarcinoma. Plan for 1 month follow up outpatient.  Assessment & Plan:   Principal Problem:   Atrial fibrillation with RVR (HCC) Active Problems:   B12 deficiency   Weakness   (HFpEF) heart failure with preserved ejection fraction (HCC)   Microcytic anemia   Fecal occult blood test positive   Colonic mass   Iron deficiency anemia due to chronic blood loss   Diverticulosis of colon without hemorrhage   Adenomatous  polyp of transverse colon   Adenomatous polyp of descending colon   Adenomatous polyp of sigmoid colon   Gastritis and gastroduodenitis   Hiatal hernia  Orthostatic Hypotension  Dyspnea on Exertion  Concern for continued symptomatic anemia - notable today when working with therapy - typically rate controlled with afib, but got RVR and also got hypoxic (though poor pleth reading) - with recent Hb ~8, I wonder if most likely cause of all of these symptoms is continued symptomatic anemia - unfortunately removed IJ 1/1 -> I think she might benefit from transfusion, but can reevaluate tomorrow.  May need another central line briefly?  Unfortunately R arm restricted with DVT and L arm restricted with hx mastectomy.  Will defer decision regarding transfusion to daytime doc 1/3.   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 CT abd/pelvis staging scan showed subcentimeter mesenteric LNs and 4.5cm x 2.6cm thickening of cecum close to ileocecal junction. No hepatic abnormalities. CEA oddly only 3.6 > 4.1. Now s/p laparoscopic hand-assisted right colectomy by Dr. Thermon Leyland 12/26.  - Has multimodal pain regimen ordered - post op recs per surgery - soft diet per surgery- stable for d/c per surgery - Wound care per surgery.  - Follow up with oncology after discharge. - follow surgical pathology -> invasive colonic adenocarcinoma, will need to discuss with oncology.  Discussed with oncology, who recommended follow up in 1 month outpatient.  Influenza B Infection  - tamiflu  Delirium  Mild cognitive impairment:  - much improved - MRI brain (without acute stroke) - delirium precautions   Acute on chronic  combined HFrEF with recovered EF, HTN: Needed IVF support while taking inadequate PO.  - diuresis per cardiology, appreciate assistance -> PO lasix at this time - Metoprolol - spironolactone on  hold - Coronary calcifications noted on scans, though deferring ischemic evaluation at this time.   Acute hypoxic respiratory failure: Suspect pulmonary edema worsening due to intraoperative fluids, though had increased cough prior. WBC stable/normal, afebrile. - Check CXR (poor inspiratory effort, no focal infiltrate) - Provide supplemental O2 to maintain SpO2 >89% with normal WOB - repeat CXR without focal airspace disease   Acute right brachial/axillary DVT, acute right cephalic and left cephalic SVT: In the setting of malignancy, remains hypercoagulable.  - continue lovenox -> transition to xarelto - monitor Hb on anticoagulation   Persistent AFib with RVR:  - CHA2DS2-VASc score is 5. Anticoagulation as above - Rates improved with digoxin. BP normalizing over past 24 hours. - Continue amiodarone, converted IV > PO 12/22. - appreciate cardiology recommendations, dig/amio per cards - amiodarone 200 mg BID x2 weeks (until 06/15/2022), then daily on 1/7.  Dig d/c'd.  Metoprolol, uptitrate as tolerated.   Symptomatic iron deficiency, acute on chronic blood loss anemia: Ferritin is 7, iron 15, 3% sat, TIBC up to 535. Suspect thrombocytosis is reactive to this. s/p 1u PRBC 12/22. - IV iron given, stopped oral iron with dark stools complicating ability to discern GI bleeding. Stools now Natasha Dickson. - Hgb fluctuating, will follow    Vitamin B12 deficiency: Level is 166. - IM supplemented daily x7 days, now weekly   HLD: CK low.  - Statin   Stage IIIa CKD: Based on available CrCl values.  - Avoid nephrotoxins as able.  - CrCl continues improvement   Obesity: Body mass index is 39.11 kg/m.    Hypokalemia: replace and follow  Central line in place, R IJ 12/24.  Discontinued 1/1 in preparation for discharge.  She doesn't have access now.    DVT prophylaxis: lovenox Code Status: full code Family Communication: husband, daughter elaine Disposition:   Status is: Inpatient Remains  inpatient appropriate because: need for post op care - she really wants to return home after discharge - doesn't seem particularly interested in rehab - will discuss with family  --- like d/c with home health   Consultants:  Cardiology Surgery GI  Procedures:  12/26 LAPAROSCOPIC HAND ASSISTED RIGHT COLECTOMY   12/23 Summary:    Right:  Findings consistent with acute deep vein thrombosis involving the right  axillary  vein and right brachial veins. Findings consistent with acute superficial  vein  thrombosis involving the right basilic vein.    Left:  Findings consistent with acute superficial vein thrombosis involving the  left  cephalic vein.   EGD - Preparation of the colon was fair, but adequate for the purposes of this study. - Malignant completely obstructing tumor in the cecum and at the ileocecal valve. Biopsied. Tattooed. - Two 4 to 5 mm polyps in the descending colon and in the transverse colon, removed with Natasha Dickson cold snare. Resected and retrieved. - One 8 mm polyp in the sigmoid colon, removed with Natasha Dickson cold snare. Resected and retrieved. - Diverticulosis in the sigmoid colon. - Non-bleeding internal hemorrhoids. - Stool in the sigmoid colon and in the descending colon. recommendation - Return patient to hospital ward for ongoing care. - Clear liquid diet today pending surgical evaluation. If no plan for inpatient surgery, will advance diet. - Continue present medications. - Await pathology results. - Consult inpatient General Surgery -  Consult inpatient General Surgery/Colorectal Surgery today. - CT abdomen/pelvis for staging. Completed CT chest on 05/27/2022. - CEA. - Ok to resume heparin gtt. - IV iron on this admission. - Continue B12 and folate. - Continue serial CBC checks.  EGD - Normal esophagus. - 3 cm hiatal hernia. - Gastritis. Biopsied. - Normal mucosa was found in the gastric fundus and in the gastric body. Biopsied. - Natasha Dickson few gastric polyps. -  Normal examined duodenum. Biopsied. Impression: - Perform Natasha Dickson colonoscopy today. - Will follow-up on pathology results. - Additional accommodations pending colonoscopy findings.  Antimicrobials:  Anti-infectives (From admission, onward)    Start     Dose/Rate Route Frequency Ordered Stop   06/07/22 2200  oseltamivir (TAMIFLU) capsule 30 mg       See Hyperspace for full Linked Orders Report.   30 mg Oral 2 times daily 06/07/22 1455 06/12/22 0959   06/07/22 1545  oseltamivir (TAMIFLU) capsule 75 mg       See Hyperspace for full Linked Orders Report.   75 mg Oral  Once 06/07/22 1455 06/07/22 1616   06/04/22 0600  cefoTEtan (CEFOTAN) 2 g in sodium chloride 0.9 % 100 mL IVPB  Status:  Discontinued        2 g 200 mL/hr over 30 Minutes Intravenous On call to O.R. 06/03/22 1324 06/04/22 0956   06/03/22 1400  neomycin (MYCIFRADIN) tablet 1,000 mg       See Hyperspace for full Linked Orders Report.   1,000 mg Oral 3 times per day 06/03/22 0841 06/03/22 2159   06/03/22 1400  metroNIDAZOLE (FLAGYL) tablet 1,000 mg       See Hyperspace for full Linked Orders Report.   1,000 mg Oral 3 times per day 06/03/22 0841 06/03/22 2116   05/31/22 0600  cefoTEtan (CEFOTAN) 2 g in sodium chloride 0.9 % 100 mL IVPB        2 g 200 mL/hr over 30 Minutes Intravenous On call to O.R. 05/30/22 1716 06/01/22 0559   05/30/22 2200  neomycin (MYCIFRADIN) tablet 1,000 mg       See Hyperspace for full Linked Orders Report.   1,000 mg Oral 3 times per day 05/30/22 1716 05/30/22 2238   05/30/22 2200  metroNIDAZOLE (FLAGYL) tablet 1,000 mg       See Hyperspace for full Linked Orders Report.   1,000 mg Oral 3 times per day 05/30/22 1716 05/30/22 2128       Subjective: C/o SOB with exertion LH with standing  Objective: Vitals:   06/10/22 1603 06/10/22 2017 06/11/22 0436 06/11/22 1100  BP: (!) 105/52 (!) 117/49 138/73 (!) 135/98  Pulse: 63 72 73 (!) 105  Resp: '14 12 17 19  '$ Temp: 97.7 F (36.5 C) 98.2 F (36.8 C)  97.9 F (36.6 C)   TempSrc: Oral Oral Oral   SpO2: 97% 99% 95% (!) 74%  Weight:   92.8 kg   Height:        Intake/Output Summary (Last 24 hours) at 06/11/2022 1936 Last data filed at 06/10/2022 2330 Gross per 24 hour  Intake --  Output 500 ml  Net -500 ml   Filed Weights   06/09/22 0548 06/10/22 0456 06/11/22 0436  Weight: 90.7 kg 92.5 kg 92.8 kg    Examination:  General: No acute distress. Cardiovascular: RRR Lungs: unlabored Abdomen: Soft, nontender, nondistended Neurological: Alert and oriented 3. Moves all extremities 4 with equal strength. Cranial nerves II through XII grossly intact. Extremities: No clubbing or cyanosis. No edema.  Data Reviewed: I have personally reviewed following labs and imaging studies  CBC: Recent Labs  Lab 06/05/22 0357 06/05/22 2030 06/06/22 0443 06/07/22 0321 06/08/22 0450 06/09/22 0950  WBC 11.4*  --  10.7* 6.8 6.6 7.7  NEUTROABS  --   --   --  4.9 4.6 5.0  HGB 8.1* 8.4* 8.5* 8.9* 8.2* 8.2*  HCT 26.1* 26.8* 28.5* 30.5* 28.8* 28.6*  MCV 81.1  --  82.4 83.3 86.5 87.2  PLT 221  --  245 266 261 284    Basic Metabolic Panel: Recent Labs  Lab 06/05/22 0357 06/06/22 0443 06/07/22 0321 06/08/22 0450 06/09/22 0950  NA 135 136 140 140 134*  K 4.2 3.5 3.3* 3.5 3.7  CL 106 103 102 103 103  CO2 '23 26 27 28 28  '$ GLUCOSE 110* 94 90 85 89  BUN '9 11 10 12 13  '$ CREATININE 0.91 0.95 1.02* 1.06* 0.98  CALCIUM 7.9* 7.9* 8.0* 7.8* 7.4*  MG  --   --  2.1 2.4 2.2  PHOS  --   --  3.2 3.9 3.2    GFR: Estimated Creatinine Clearance: 50.7 mL/min (by C-G formula based on SCr of 0.98 mg/dL).  Liver Function Tests: Recent Labs  Lab 06/07/22 0321 06/08/22 0450 06/09/22 0950  AST '31 30 26  '$ ALT '17 14 16  '$ ALKPHOS 50 45 46  BILITOT 0.6 0.6 0.5  PROT 5.6* 5.2* 5.3*  ALBUMIN 2.5* 2.3* 2.2*    CBG: No results for input(s): "GLUCAP" in the last 168 hours.   Recent Results (from the past 240 hour(s))  Surgical pcr screen     Status:  Abnormal   Collection Time: 06/03/22  8:12 PM   Specimen: Nasal Mucosa; Nasal Swab  Result Value Ref Range Status   MRSA, PCR NEGATIVE NEGATIVE Final   Staphylococcus aureus POSITIVE (Natasha Dickson) NEGATIVE Final    Comment: (NOTE) The Xpert SA Assay (FDA approved for NASAL specimens in patients 36 years of age and older), is one component of Natasha Dickson comprehensive surveillance program. It is not intended to diagnose infection nor to guide or monitor treatment. Performed at [facility], [place] [address], [place], [place] [address]   Resp panel by RT-PCR (RSV, Flu Natasha Dickson&B, Covid) Anterior Nasal Swab     Status: Abnormal   Collection Time: 06/07/22 10:09 AM   Specimen: Anterior Nasal Swab  Result Value Ref Range Status   SARS Coronavirus 2 by RT PCR NEGATIVE NEGATIVE Final    Comment: (NOTE) SARS-CoV-2 target nucleic acids are NOT DETECTED.  The SARS-CoV-2 RNA is generally detectable in upper respiratory specimens during the acute phase of infection. The lowest concentration of SARS-CoV-2 viral copies this assay can detect is 138 copies/mL. Natasha Dickson negative result does not preclude SARS-Cov-2 infection and should not be used as the sole basis for treatment or other patient management decisions. Natasha Dickson negative result may occur with  improper specimen collection/handling, submission of specimen other than nasopharyngeal swab, presence of viral mutation(s) within the areas targeted by this assay, and inadequate number of viral copies(<138 copies/mL). Natasha Dickson negative result must be combined with clinical observations, patient history, and epidemiological information. The expected result is Negative.  Fact Sheet for Patients:  EntrepreneurPulse.com.au  Fact Sheet for Healthcare Providers:  IncredibleEmployment.be  This test is no t yet approved or cleared by the [place] FDA and  has been authorized for detection and/or diagnosis of SARS-CoV-2 by FDA under an Emergency Use  Authorization (EUA). This EUA will remain  in effect (meaning this test can be used) for the duration of the COVID-19 declaration under Section 564(b)(1) of the Act, 21 U.S.C.section 360bbb-3(b)(1), unless the authorization is terminated  or revoked sooner.       Influenza Natasha Dickson by PCR NEGATIVE NEGATIVE Final   Influenza B by PCR POSITIVE (Linh Johannes) NEGATIVE Final    Comment: (NOTE) The Xpert Xpress SARS-CoV-2/FLU/RSV plus assay is intended as an aid in the diagnosis of influenza from Nasopharyngeal swab specimens and should not be used as Natasha Dickson sole basis for treatment. Nasal washings and aspirates are unacceptable for Xpert Xpress SARS-CoV-2/FLU/RSV testing.  Fact Sheet for Patients: EntrepreneurPulse.com.au  Fact Sheet for Healthcare Providers: IncredibleEmployment.be  This test is not yet approved or cleared by the [place] FDA and has been authorized for detection and/or diagnosis of SARS-CoV-2 by FDA under an Emergency Use Authorization (EUA). This EUA will remain in effect (meaning this test can be used) for the duration of the COVID-19 declaration under Section 564(b)(1) of the Act, 21 U.S.C. section 360bbb-3(b)(1), unless the authorization is terminated or revoked.     Resp Syncytial Virus by PCR NEGATIVE NEGATIVE Final    Comment: (NOTE) Fact Sheet for Patients: EntrepreneurPulse.com.au  Fact Sheet for Healthcare Providers: IncredibleEmployment.be  This test is not yet approved or cleared by the [place] FDA and has been authorized for detection and/or diagnosis of SARS-CoV-2 by FDA under an Emergency Use Authorization (EUA). This EUA will remain in effect (meaning this test can be used) for the duration of the COVID-19 declaration under Section 564(b)(1) of the Act, 21 U.S.C. section 360bbb-3(b)(1), unless the authorization is terminated or revoked.  Performed at [facility], [place] [address], [place], [place] [address]          Radiology Studies: DG CHEST PORT 1 VIEW  Result Date: 06/11/2022 CLINICAL DATA:  Shortness of breath EXAM: PORTABLE CHEST 1 VIEW COMPARISON:  Radiograph 06/04/2022 FINDINGS: Unchanged enlarged cardiac silhouette. There is no focal airspace consolidation. There is no pleural effusion or evidence of pneumothorax. Bones are unchanged. Thoracic spondylosis. IMPRESSION: No focal airspace disease.  Unchanged cardiomegaly. Electronically Signed   By: Maurine Simmering M.D.   On: 06/11/2022 15:14        Scheduled Meds:  acetaminophen  650 mg Oral Q6H   amiodarone  200 mg Oral BID   atorvastatin  10 mg Oral Daily   Chlorhexidine Gluconate Cloth  6 each Topical Daily   cyanocobalamin  1,000 mcg Intramuscular Q Sun   feeding supplement  237 mL Oral TID BM   folic acid  1 mg Oral Daily   furosemide  20 mg Oral Daily   gabapentin  300 mg Oral TID   metoprolol succinate  25 mg Oral Daily   oseltamivir  30 mg Oral BID   pantoprazole  40 mg Oral Daily   rivaroxaban  15 mg Oral BID WC   Continuous Infusions:     LOS: 15 days    Time spent: over 30 min    Fayrene Helper, MD [facility]   To contact the attending provider between 7A-7P or the covering provider during after hours 7P-7A, please log into the web site [email or web address] and access using universal [place] password for that web site. If you do not have the password, please call the hospital operator.  06/11/2022, 7:36 PM

## 2022-06-12 DIAGNOSIS — R2689 Other abnormalities of gait and mobility: Secondary | ICD-10-CM | POA: Diagnosis not present

## 2022-06-12 DIAGNOSIS — E785 Hyperlipidemia, unspecified: Secondary | ICD-10-CM | POA: Diagnosis not present

## 2022-06-12 DIAGNOSIS — Z48815 Encounter for surgical aftercare following surgery on the digestive system: Secondary | ICD-10-CM | POA: Diagnosis not present

## 2022-06-12 DIAGNOSIS — R54 Age-related physical debility: Secondary | ICD-10-CM | POA: Diagnosis not present

## 2022-06-12 DIAGNOSIS — D500 Iron deficiency anemia secondary to blood loss (chronic): Secondary | ICD-10-CM | POA: Diagnosis not present

## 2022-06-12 DIAGNOSIS — D509 Iron deficiency anemia, unspecified: Secondary | ICD-10-CM | POA: Diagnosis not present

## 2022-06-12 DIAGNOSIS — R41841 Cognitive communication deficit: Secondary | ICD-10-CM | POA: Diagnosis not present

## 2022-06-12 DIAGNOSIS — M6281 Muscle weakness (generalized): Secondary | ICD-10-CM | POA: Diagnosis not present

## 2022-06-12 DIAGNOSIS — E538 Deficiency of other specified B group vitamins: Secondary | ICD-10-CM | POA: Diagnosis not present

## 2022-06-12 DIAGNOSIS — R531 Weakness: Secondary | ICD-10-CM | POA: Diagnosis not present

## 2022-06-12 DIAGNOSIS — Z7401 Bed confinement status: Secondary | ICD-10-CM | POA: Diagnosis not present

## 2022-06-12 DIAGNOSIS — J9601 Acute respiratory failure with hypoxia: Secondary | ICD-10-CM | POA: Diagnosis not present

## 2022-06-12 DIAGNOSIS — I5043 Acute on chronic combined systolic (congestive) and diastolic (congestive) heart failure: Secondary | ICD-10-CM | POA: Diagnosis not present

## 2022-06-12 DIAGNOSIS — R2681 Unsteadiness on feet: Secondary | ICD-10-CM | POA: Diagnosis not present

## 2022-06-12 DIAGNOSIS — C189 Malignant neoplasm of colon, unspecified: Secondary | ICD-10-CM | POA: Diagnosis not present

## 2022-06-12 DIAGNOSIS — I4819 Other persistent atrial fibrillation: Secondary | ICD-10-CM | POA: Diagnosis not present

## 2022-06-12 DIAGNOSIS — I4891 Unspecified atrial fibrillation: Secondary | ICD-10-CM | POA: Diagnosis not present

## 2022-06-12 LAB — HEMOGLOBIN AND HEMATOCRIT, BLOOD
HCT: 28.2 % — ABNORMAL LOW (ref 36.0–46.0)
Hemoglobin: 8.7 g/dL — ABNORMAL LOW (ref 12.0–15.0)

## 2022-06-12 MED ORDER — RIVAROXABAN 15 MG PO TABS: ORAL_TABLET | ORAL | Status: DC

## 2022-06-12 MED ORDER — RIVAROXABAN 15 MG PO TABS: 15.0000 mg | ORAL_TABLET | Freq: Two times a day (BID) | ORAL | Status: DC

## 2022-06-12 MED ORDER — METOPROLOL SUCCINATE ER 25 MG PO TB24: 25.0000 mg | ORAL_TABLET | Freq: Every day | ORAL | Status: DC

## 2022-06-12 MED ORDER — RIVAROXABAN 20 MG PO TABS: 20.0000 mg | ORAL_TABLET | Freq: Every day | ORAL | Status: DC

## 2022-06-12 MED ORDER — VITAMIN B-12 1000 MCG PO TABS: 1000.0000 ug | ORAL_TABLET | Freq: Every day | ORAL | Status: DC

## 2022-06-12 MED ORDER — ENSURE ENLIVE PO LIQD: 237.0000 mL | Freq: Three times a day (TID) | ORAL | Status: DC

## 2022-06-12 MED ORDER — GUAIFENESIN-DM 100-10 MG/5ML PO SYRP: 5.0000 mL | ORAL_SOLUTION | ORAL | 0 refills | Status: DC | PRN

## 2022-06-12 MED ORDER — AMIODARONE HCL 200 MG PO TABS: ORAL_TABLET | ORAL | Status: DC

## 2022-06-12 MED ORDER — FOLIC ACID 1 MG PO TABS: 1.0000 mg | ORAL_TABLET | Freq: Every day | ORAL | Status: DC

## 2022-06-12 MED ORDER — GABAPENTIN 100 MG PO CAPS: 100.0000 mg | ORAL_CAPSULE | Freq: Three times a day (TID) | ORAL | Status: DC

## 2022-06-12 MED ORDER — ACETAMINOPHEN 325 MG PO TABS: 650.0000 mg | ORAL_TABLET | Freq: Four times a day (QID) | ORAL | Status: AC | PRN

## 2022-06-12 NOTE — Progress Notes (Signed)
Mobility Specialist - Progress Note   06/12/22 1204  Mobility  Activity Ambulated with assistance in room  Level of Assistance Moderate assist, patient does 50-74%  Assistive Device Front wheel walker  Distance Ambulated (ft) 5 ft  Activity Response Tolerated well  Mobility Referral Yes  $Mobility charge 1 Mobility   Pt received in chair and agreeable to session. No complaints throughout. Pt was returned to chair with all needs met.   Franki Monte  Mobility Specialist Please contact via Solicitor or Rehab office at [phone]

## 2022-06-12 NOTE — Progress Notes (Signed)
Report called to [facility] receiving Nurse Anne Shutter, RN. Questions answered and no concerns verbalized. Awaiting [facility] to transfer out, ETA 1300. Leveda Anna, BSN, RN

## 2022-06-12 NOTE — Plan of Care (Signed)

## 2022-06-12 NOTE — Progress Notes (Signed)
Occupational Therapy Treatment Patient Details Name: Natasha Dickson MRN: [id number] DOB: Oct 12, 1945 Today's Date: 06/12/2022   History of present illness 77 y.o. female who presented to the ED 12/18 with hypotension and weakness. 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  Pt s/p laporoscopic R colectomy on 06/04/22.  PMH: A. Fib with RVR on Xarelto (last dose 12/18, currently on heparin gtt), CHF (30-35% 2014, now 55-60% in 04/2022), HTN, HLD, and CKD2.   OT comments  Patient received in bed, on RA, and states she feels better today. Patient making good gains with min guard to min assist for sit to stands and transfers. Patient required 2 transfers to [facility] due to loose stools. Patient's HR 92-130 with activity and SpO2 97 on RA. Patient expected to discharge to SNF for continued OT for increased independence in self care.   Recommendations for follow up therapy are one component of a multi-disciplinary discharge planning process, led by the attending physician.  Recommendations may be updated based on patient status, additional functional criteria and insurance authorization.    Follow Up Recommendations  Skilled nursing-short term rehab (<3 hours/day)     Assistance Recommended at Discharge Intermittent Supervision/Assistance  Patient can return home with the following  A little help with walking and/or transfers;A little help with bathing/dressing/bathroom;Assistance with cooking/housework;Assist for transportation;Help with stairs or ramp for entrance   Equipment Recommendations  BSC/3in1    Recommendations for Other Services      Precautions / Restrictions Precautions Precautions: Fall Precaution Comments: watch BP Restrictions Weight Bearing Restrictions: No       Mobility Bed Mobility Overal bed mobility: Needs Assistance Bed Mobility: Supine to Sit     Supine to sit: Min assist     General bed mobility comments: min assist to  raise trunk with use of bed rails and HOB elevated    Transfers Overall transfer level: Needs assistance Equipment used: Rolling walker (2 wheels) Transfers: Sit to/from Stand, Bed to chair/wheelchair/BSC Sit to Stand: Min assist     Step pivot transfers: Min assist, Min guard     General transfer comment: min assist to min guard assist to power up and min guard when up     Balance Overall balance assessment: Needs assistance Sitting-balance support: Feet supported, Bilateral upper extremity supported Sitting balance-Leahy Scale: Fair     Standing balance support: Single extremity supported, Bilateral upper extremity supported, No upper extremity supported Standing balance-Leahy Scale: Fair Standing balance comment: single extremity support while performing grooming standing at sink                           ADL either performed or assessed with clinical judgement   ADL Overall ADL's : Needs assistance/impaired     Grooming: Wash/dry hands;Wash/dry face;Oral care;Standing;Min guard Grooming Details (indicate cue type and reason): able to stand at sink for grooming with HR 119 before needing seated rest break Upper Body Bathing: Set up;Sitting   Lower Body Bathing: Minimal assistance;Sit to/from stand Lower Body Bathing Details (indicate cue type and reason): peri area bathing performed seated Upper Body Dressing : Set up;Sitting Upper Body Dressing Details (indicate cue type and reason): changed gown     Toilet Transfer: Min guard;BSC/3in1;Rolling walker (2 wheels) Toilet Transfer Details (indicate cue type and reason): performed 2 transfers to toilet during visit [place] and Hygiene: Moderate assistance;Sit to/from stand Toileting - Clothing Manipulation Details (indicate cue type and reason):  required mod assist to complete toilet hygiene standing            Extremity/Trunk Assessment              Vision        Perception     Praxis      Cognition Arousal/Alertness: Awake/alert Behavior During Therapy: WFL for tasks assessed/performed, Flat affect Overall Cognitive Status: Within Functional Limits for tasks assessed                                          Exercises      Shoulder Instructions       General Comments HR 92-130 with activity, BP 125/74, SpO2 97 on RA    Pertinent Vitals/ Pain       Pain Assessment Pain Assessment: Faces Faces Pain Scale: Hurts a little bit Pain Location: generalized with movement Pain Descriptors / Indicators: Grimacing Pain Intervention(s): Monitored during session, Repositioned  Home Living                                          Prior Functioning/Environment              Frequency  Min 2X/week        Progress Toward Goals  OT Goals(current goals can now be found in the care plan section)  Progress towards OT goals: Progressing toward goals  Acute Rehab OT Goals Patient Stated Goal: get better OT Goal Formulation: With patient Time For Goal Achievement: 06/13/22 Potential to Achieve Goals: Good ADL Goals Pt Will Perform Grooming: with modified independence;standing Pt Will Perform Lower Body Dressing: with modified independence;sit to/from stand Pt Will Transfer to Toilet: with modified independence;ambulating Additional ADL Goal #1: Pt will complete at least 8 minutes of OOB functional tasks to demonstrate increased activity tolerance Additional ADL Goal #2: Pt will indep complete IADL medication management task  Plan Discharge plan needs to be updated    Co-evaluation                 AM-PAC OT "6 Clicks" Daily Activity     Outcome Measure   Help from another person eating meals?: None Help from another person taking care of personal grooming?: A Little Help from another person toileting, which includes using toliet, bedpan, or urinal?: A Little Help from another person  bathing (including washing, rinsing, drying)?: A Little Help from another person to put on and taking off regular upper body clothing?: None Help from another person to put on and taking off regular lower body clothing?: A Lot 6 Click Score: 19    End of Session Equipment Utilized During Treatment: Rolling walker (2 wheels)  OT Visit Diagnosis: Unsteadiness on feet (R26.81);Other abnormalities of gait and mobility (R26.89);Muscle weakness (generalized) (M62.81);Pain   Activity Tolerance Patient tolerated treatment well   Patient Left in chair;with call bell/phone within reach;with chair alarm set   Nurse Communication Mobility status        Time: 9678-9381 OT Time Calculation (min): 33 min  Charges: OT General Charges $OT Visit: 1 Visit OT Treatments $Self Care/Home Management : 23-37 mins  Lodema Hong, [place]  Office Clar[place] 06/12/2022, 1:35 PM

## 2022-06-12 NOTE — Discharge Summary (Signed)
Physician Discharge Summary   Patient: Natasha Dickson MRN: [id number] DOB: [birth year]  Admit date:     05/27/2022  Discharge date: 06/12/22  Discharge Physician: [name]   PCP: Tonia Ghent, MD   Recommendations at discharge:  Follow up with PCP in 1-2 weeks from SNF discharge. Suggest monitoring CBC and BMP at least weekly post discharge until stable.  Follow up with oncology in 1 month for discussion of management of colon CA. Follow up with general surgery s/p colectomy 12/26.  Follow up with cardiology in 1-2 weeks to continue management of AFib with RVR. Discharged on metoprolol, xarelto, and amiodarone.   Discharge Diagnoses: Principal Problem:   Atrial fibrillation with RVR ([place]) Active Problems:   B12 deficiency   Weakness   (HFpEF) heart failure with preserved ejection fraction (HCC)   Microcytic anemia   Fecal occult blood test positive   Colonic mass   Iron deficiency anemia due to chronic blood loss   Diverticulosis of colon without hemorrhage   Adenomatous polyp of transverse colon   Adenomatous polyp of descending colon   Adenomatous polyp of sigmoid colon   Gastritis and gastroduodenitis   Hiatal hernia  Hospital Course: Natasha Dickson is a 77 y.o. female with medical history significant of atrial fibrillation on xarelto, systolic HF (recovered EF), non ischemic cardiomyopathy, hypertension and multiple other medical issues here with progressive generalized weakness.  Found to have new anemia and + FOBT as well as AFib with RVR. She was admitted with cardiology consultations. GI took for EGD/colonoscopy 12/20 which showed a "malignant completely obstructing tumor in the cecum and at the ileocecal valve," which has been found to be invasive adenocarcinoma. There were polyps in the transverse, descending, and sigmoid colon which were also sent to pathology ultimately ruling out for malignancy. CT abd/pelvis showed mesenteric LNs, no liver lesions. Rate control  has been complicated by hypotension for which cardiology is consulted, maintaining marginal rate control with amiodarone with soft BP. She has received IV iron and 1u PRBCs 12/20 for progressive anemia and had heparin held due to this. Due to continued difficulty with venous access, PICC line placed, subsequently developed DVT of right brachial and axillary veins as well as SVT of bilateral cephalic veins for which therapeutic anticoagulation was reinitiated and PICC discontinued, right IJ inserted 12/24. Surgery performed hemicolectomy 12/26.    Path with invasive adenocarcinoma for which oncology has recommended outpatient follow up in 1 month. She completed a course of tamiflu for influenza diagnosed 12/29 and is stable for discharge. Will require SNF rehabilitation prior to returning home.  Assessment and Plan: Orthostatic hypotension: Improving overall.  - Continue monitoring with PT/OT at SNF. Volume status currently stable with improving anemia and stable heart rate/BP.    Invasive moderately differentiated adenocarcinoma of the cecum: Other polyps tested negative for malignancy. CT abd/pelvis staging scan showed subcentimeter mesenteric LNs and 4.5cm x 2.6cm thickening of cecum close to ileocecal junction. No hepatic abnormalities. CEA oddly only 3.6 > 4.1. Now s/p laparoscopic hand-assisted right colectomy by Dr. Thermon Leyland 12/26.  - Has multimodal pain regimen ordered, tapering gabapentin to '100mg'$  tID, can come off in coming days. Has not required any tramadol or oxycodone.  - Soft diet per surgery- stable for d/c per surgery - Wounds closed > routine care. - Surgical pathology showed invasive colonic adenocarcinoma, oncology recommends follow up in 1 month outpatient.   Influenza B infection: Symptoms improved, Dx 12/29. Has completed 5 days of tamiflu. -  Antitussives prn   Delirium  Mild cognitive impairment:  - much improved - MRI brain (without acute stroke) - delirium  precautions   Acute on chronic combined HFrEF with recovered EF, HTN:  - Diuresis per cardiology, appreciate assistance. Will continue lasix '20mg'$  po daily - Metoprolol dose decreased, will continue on '25mg'$  daily.  - PTA spironolactone, losartan on hold - Coronary calcifications noted on scans, though deferring ischemic evaluation at this time.   Acute hypoxic respiratory failure: Suspect pulmonary edema worsening due to intraoperative fluids, influenza also contributed. Repeat CXR without focal airspace disease - Provide supplemental O2 to maintain SpO2 >89% with normal WOB. Not currently requiring supplemental oxygen.   Acute right brachial/axillary DVT, acute right cephalic and left cephalic SVT: - Continue anticoagulation with xarelto '15mg'$  BID until 1/17 when we switch to '20mg'$  daily.    Persistent AFib with RVR:  - CHA2DS2-VASc score is 5. Continue xarelto. - Rates improved and BP stabilized. BP has tolerated metoprolol since 12/29. Will continue amiodarone 200 mg BID x2 weeks (until 06/15/2022), then daily on 1/7.  - Follow up with cardiology after DC.   Symptomatic iron deficiency, acute on chronic blood loss anemia: Ferritin is 7, iron 15, 3% sat, TIBC up to 535. Suspect thrombocytosis is reactive to this. s/p 1u PRBC 12/22. - IV iron given, stopped oral iron with dark stools complicating ability to discern GI bleeding. Stools now [place]. Since hgb stable, will restart oral iron supplement at discharge  - Monitor CBC closely. Hgb 8.7g/dl at discharge with no evidence of ongoing bleeding.    Vitamin B12 deficiency: Level is 166. - IM supplemented while admitted, continue daily oral supplement   HLD: CK low.  - Statin   Stage IIIa CKD: Based on available CrCl values, 94m/min at discharge. - Avoid nephrotoxins, monitor BMP.   Obesity: Body mass index is 39.11 kg/m.    Hypokalemia: Continue monitoring.   Central line in place, R IJ 12/24.  Discontinued 1/1 in preparation for  discharge.  Consultants: GI, surgery, cardiology, oncology Procedures performed:  05/29/22 COLONOSCOPY WITH PROPOFOLCirigliano, Vito V, DO  12/26/23LAPAROSCOPIC HAND ASSISTED RIGHT COLECTOMYStechschulte, PNickola Major MD  Disposition: Skilled nursing facility Diet recommendation: Soft DISCHARGE MEDICATION: Allergies as of 06/12/2022   No Known Allergies      Medication List     STOP taking these medications    cyanocobalamin 1000 MCG/ML injection Commonly known as: VITAMIN B12 Replaced by: cyanocobalamin 1000 MCG tablet   cyclobenzaprine 10 MG tablet Commonly known as: FLEXERIL   diltiazem 180 MG 24 hr capsule Commonly known as: CARDIZEM CD   losartan 25 MG tablet Commonly known as: COZAAR   spironolactone 25 MG tablet Commonly known as: ALDACTONE   traMADol 50 MG tablet Commonly known as: ULTRAM       TAKE these medications    acetaminophen 325 MG tablet Commonly known as: TYLENOL Take 2 tablets (650 mg total) by mouth every 6 (six) hours as needed for mild pain, moderate pain, fever or headache.   amiodarone 200 MG tablet Commonly known as: PACERONE Take 1 tablet (200 mg total) by mouth 2 (two) times daily for 3 days, THEN 1 tablet (200 mg total) daily for 27 days. Start taking on: June 12, 2022   atorvastatin 10 MG tablet Commonly known as: LIPITOR Take 1 tablet (10 mg total) by mouth daily. Held as of 05/17/22   cyanocobalamin 1000 MCG tablet Commonly known as: VITAMIN B12 Take 1 tablet (1,000 mcg total) by mouth daily.  Replaces: cyanocobalamin 1000 MCG/ML injection   feeding supplement Liqd Take 237 mLs by mouth 3 (three) times daily between meals.   folic acid 1 MG tablet Commonly known as: FOLVITE Take 1 tablet (1 mg total) by mouth daily. Start taking on: June 13, 2022   furosemide 20 MG tablet Commonly known as: LASIX Take 1 tablet (20 mg total) by mouth daily.   gabapentin 100 MG capsule Commonly known as: NEURONTIN Take 1 capsule (100  mg total) by mouth 3 (three) times daily.   guaiFENesin-dextromethorphan 100-10 MG/5ML syrup Commonly known as: ROBITUSSIN DM Take 5 mLs by mouth every 4 (four) hours as needed for cough (chest congestion).   metoprolol succinate 25 MG 24 hr tablet Commonly known as: TOPROL-XL Take 1 tablet (25 mg total) by mouth daily. Start taking on: June 13, 2022 What changed:  medication strength how much to take how to take this when to take this additional instructions   nitroGLYCERIN 0.4 MG SL tablet Commonly known as: NITROSTAT DISSOLVE ONE TABLET UNDER THE TONGUE EVERY 5 MINUTES AS NEEDED FOR CHEST PAIN.  DO NOT EXCEED A TOTAL OF 3 DOSES IN 15 MINUTES What changed: See the new instructions.   Rivaroxaban 15 MG Tabs tablet Commonly known as: XARELTO '15mg'$  twice daily until Jun 26, 2022 then switch to '20mg'$  once daily thereafter What changed:  medication strength how much to take how to take this when to take this additional instructions               Durable Medical Equipment  (From admission, onward)           Start     Ordered   06/10/22 1538  For home use only DME Walker rolling  Once       Question Answer Comment  Walker: With [place]   Patient needs a walker to treat with the following condition General weakness      06/10/22 1538   06/10/22 1538  For home use only DME Bedside commode  Once       Question:  Patient needs a bedside commode to treat with the following condition  Answer:  General weakness   06/10/22 1538            Follow-up Information     Stechschulte, Nickola Major, MD. Go on 08/02/2022.   Specialty: Surgery Why: 9 AM, please arrive 30 min early to complete paperwork Contact information: [address] [address] [phone]         [facility] Follow up.   Specialty: Home Health Services Why: Registered Nurse, Physical and Occupational Therapy, Soical Worker, Aide-office to call with  visit times. Contact information: [place] [address] [phone]         [facility] Follow up.   Why: Rolling walker and bedside commode to be delivered to the room. Contact information: Address: [address], [place], [place] [address] Phone: [phone]        Tonia Ghent, MD Follow up.   Specialty: Family Medicine Contact information: [place] [address] [phone]         Freada Bergeron, MD. Schedule an appointment as soon as possible for a visit in 1 month(s).   Specialties: Cardiology, Radiology Contact information: [address] [place] [address] [place] Follow up.  Why: Phone: [phone]. Call on Monday if you have not received a call to schedule an appointment with a GI cancer specialist.               Discharge Exam: Filed Weights   06/10/22 0456 06/11/22 0436 06/12/22 0610  Weight: 92.5 kg 92.8 kg 94.2 kg  BP (!) 112/55 (BP Location: Right Arm)   Pulse 64   Temp 97.7 F (36.5 C) (Oral)   Resp 15   Ht '5\' 1"'$  (1.549 m)   Wt 94.2 kg   SpO2 97%   BMI 39.24 kg/m   No distress, pleasant, sitting in chair Trace LE edema. Trace R  > L UE edema.  Clear, nonlabored, frequent cough Irreg, rate in 60-70's, no JVD.  Soft, NT, ND, healed surgical incision sites Alert, oriented  Condition at discharge: stable  The results of significant diagnostics from this hospitalization (including imaging, microbiology, ancillary and laboratory) are listed below for reference.   Imaging Studies: DG CHEST PORT 1 VIEW  Result Date: 06/11/2022 CLINICAL DATA:  Shortness of breath EXAM: PORTABLE CHEST 1 VIEW COMPARISON:  Radiograph 06/04/2022 FINDINGS: Unchanged enlarged cardiac silhouette. There is no focal airspace consolidation. There is no pleural effusion or evidence of pneumothorax. Bones are unchanged. Thoracic  spondylosis. IMPRESSION: No focal airspace disease.  Unchanged cardiomegaly. Electronically Signed   By: Maurine Simmering M.D.   On: 06/11/2022 15:14   MR BRAIN WO CONTRAST  Result Date: 06/08/2022 CLINICAL DATA:  Altered mental status EXAM: MRI HEAD WITHOUT CONTRAST TECHNIQUE: Multiplanar, multiecho pulse sequences of the brain and surrounding structures were obtained without intravenous contrast. COMPARISON:  None Available. FINDINGS: Brain: No acute infarction, hemorrhage, hydrocephalus, extra-axial collection or mass lesion. No chronic microhemorrhage. There is multifocal hyperintense T2-weighted signal within the periventricular and deep white matter. Dilated perivascular space at the left lentiform nucleus. Mild generalized volume loss. The midline structures are normal. Vascular: Normal flow voids. Skull and upper cervical spine: Normal bone marrow signal. Sinuses/Orbits: Bilateral mastoid fluid. Nasopharynx is clear. Paranasal sinuses are normal. Normal orbits. Other: None IMPRESSION: 1. No acute intracranial abnormality. 2. Findings of chronic microvascular ischemia and generalized volume loss. 3. Bilateral mastoid fluid. Electronically Signed   By: Ulyses Jarred M.D.   On: 06/08/2022 00:44   DG CHEST PORT 1 VIEW  Result Date: 06/04/2022 CLINICAL DATA:  Cough EXAM: PORTABLE CHEST 1 VIEW COMPARISON:  05/27/2022 FINDINGS: Overall inspiratory effort is poor. Heart is mildly enlarged but stable. Aortic calcifications are again seen. Right jugular central line is noted with the tip in the mid right atrium. No pneumothorax is seen. No focal infiltrate or effusion is noted. IMPRESSION: Poor inspiratory effort when compare with the prior exam although no focal infiltrate is seen. No pneumothorax following central line placement is noted. Electronically Signed   By: Inez Catalina M.D.   On: 06/04/2022 19:02   IR Fluoro Guide CV Line Right  Result Date: 06/02/2022 CLINICAL DATA:  Need for central venous  access. Current right upper extremity PICC line needs to be removed due to development thrombus around the catheter. Also currently with superficial thrombophlebitis in the left upper extremity related to recent IV placement. EXAM: NON-TUNNELED CENTRAL VENOUS CATHETER PLACEMENT WITH ULTRASOUND AND FLUOROSCOPIC GUIDANCE FLUOROSCOPY: 18 seconds.  2.0 mGy. PROCEDURE: The procedure, risks, benefits, and alternatives were explained to the patient. Questions regarding the procedure were encouraged and answered. The patient understands and consents to the procedure. The right neck and chest were prepped with chlorhexidine in  a sterile fashion, and a sterile drape was applied covering the operative field. Maximum barrier sterile technique with sterile gowns and gloves were used for the procedure. Local anesthesia was provided with 1% lidocaine. Ultrasound was used to confirm patency of the right internal jugular vein. A permanent ultrasound image was saved and recorded. After creating a small venotomy incision, a 21 gauge needle was advanced into the right internal jugular vein under direct, real-time ultrasound guidance. Ultrasound image documentation was performed. After securing guidewire access, a peel-away sheath was placed over a guide wire. Utilizing guide wire measurement, a 5 [place], dual-lumen power injectable non tunneled catheter was cut to 16 cm. The catheter was then placed through the sheath and the sheath removed. Final catheter positioning was confirmed and documented with a fluoroscopic spot image. The catheter was aspirated and flushed with saline. The catheter exit site was secured with 0-Prolene retention sutures. COMPLICATIONS: None.  No pneumothorax. FINDINGS: After catheter placement, the tip lies at the cavoatrial junction. The catheter aspirates normally and is ready for immediate use. IMPRESSION: Placement of non-tunneled central venous catheter via the right internal jugular vein. The catheter  tip lies at the cavoatrial junction. The catheter is ready for immediate use. Electronically Signed   By: Aletta Edouard M.D.   On: 06/02/2022 12:22   IR US Guide Vasc Access Right  Result Date: 06/02/2022 CLINICAL DATA:  Need for central venous access. Current right upper extremity PICC line needs to be removed due to development thrombus around the catheter. Also currently with superficial thrombophlebitis in the left upper extremity related to recent IV placement. EXAM: NON-TUNNELED CENTRAL VENOUS CATHETER PLACEMENT WITH ULTRASOUND AND FLUOROSCOPIC GUIDANCE FLUOROSCOPY: 18 seconds.  2.0 mGy. PROCEDURE: The procedure, risks, benefits, and alternatives were explained to the patient. Questions regarding the procedure were encouraged and answered. The patient understands and consents to the procedure. The right neck and chest were prepped with chlorhexidine in a sterile fashion, and a sterile drape was applied covering the operative field. Maximum barrier sterile technique with sterile gowns and gloves were used for the procedure. Local anesthesia was provided with 1% lidocaine. Ultrasound was used to confirm patency of the right internal jugular vein. A permanent ultrasound image was saved and recorded. After creating a small venotomy incision, a 21 gauge needle was advanced into the right internal jugular vein under direct, real-time ultrasound guidance. Ultrasound image documentation was performed. After securing guidewire access, a peel-away sheath was placed over a guide wire. Utilizing guide wire measurement, a 5 [place], dual-lumen power injectable non tunneled catheter was cut to 16 cm. The catheter was then placed through the sheath and the sheath removed. Final catheter positioning was confirmed and documented with a fluoroscopic spot image. The catheter was aspirated and flushed with saline. The catheter exit site was secured with 0-Prolene retention sutures. COMPLICATIONS: None.  No pneumothorax.  FINDINGS: After catheter placement, the tip lies at the cavoatrial junction. The catheter aspirates normally and is ready for immediate use. IMPRESSION: Placement of non-tunneled central venous catheter via the right internal jugular vein. The catheter tip lies at the cavoatrial junction. The catheter is ready for immediate use. Electronically Signed   By: Aletta Edouard M.D.   On: 06/02/2022 12:22   VAS [place] UPPER EXTREMITY VENOUS DUPLEX  Result Date: 06/01/2022 UPPER VENOUS STUDY  Patient Name:  JOSPHINE LAFFEY  Date of Exam:   06/01/2022 Medical Rec #: [id number]      Accession #:    [id number] Date  of Birth: [birth year]      Patient Gender: F Patient Age:   55 years Exam [facility]  [facility] Procedure:      VAS [place] UPPER EXTREMITY VENOUS DUPLEX Referring Phys: Vance Gather --------------------------------------------------------------------------------  Indications: Swelling Risk Factors: None identified. Limitations: Bandages and line. Comparison Study: No prior studies. Performing Technologist: Oliver Hum RVT  Examination Guidelines: A complete evaluation includes B-mode imaging, spectral Doppler, color Doppler, and power Doppler as needed of all accessible portions of each vessel. Bilateral testing is considered an integral part of a complete examination. Limited examinations for reoccurring indications may be performed as noted.  Right Findings: +----------+------------+---------+-----------+----------+-------+ RIGHT     CompressiblePhasicitySpontaneousPropertiesSummary +----------+------------+---------+-----------+----------+-------+ IJV           Full       Yes       Yes                      +----------+------------+---------+-----------+----------+-------+ Subclavian    Full       Yes       Yes                      +----------+------------+---------+-----------+----------+-------+ Axillary      None       No        No                Acute   +----------+------------+---------+-----------+----------+-------+ Brachial    Partial      No        No                Acute  +----------+------------+---------+-----------+----------+-------+ Radial        Full                                          +----------+------------+---------+-----------+----------+-------+ Ulnar         Full                                          +----------+------------+---------+-----------+----------+-------+ Cephalic      Full                                          +----------+------------+---------+-----------+----------+-------+ Basilic       None                                   Acute  +----------+------------+---------+-----------+----------+-------+  Left Findings: +----------+------------+---------+-----------+----------+-------+ LEFT      CompressiblePhasicitySpontaneousPropertiesSummary +----------+------------+---------+-----------+----------+-------+ IJV           Full       Yes       Yes                      +----------+------------+---------+-----------+----------+-------+ Subclavian    Full       Yes       Yes                      +----------+------------+---------+-----------+----------+-------+ Axillary      Full       Yes  Yes                      +----------+------------+---------+-----------+----------+-------+ Brachial      Full       Yes       Yes                      +----------+------------+---------+-----------+----------+-------+ Radial        Full                                          +----------+------------+---------+-----------+----------+-------+ Ulnar         Full                                          +----------+------------+---------+-----------+----------+-------+ Cephalic      None                                   Acute  +----------+------------+---------+-----------+----------+-------+ Basilic       Full                                           +----------+------------+---------+-----------+----------+-------+  Summary:  Right: Findings consistent with acute deep vein thrombosis involving the right axillary vein and right brachial veins. Findings consistent with acute superficial vein thrombosis involving the right basilic vein.  Left: Findings consistent with acute superficial vein thrombosis involving the left cephalic vein.  *See table(s) above for measurements and observations.  Diagnosing physician: Deitra Mayo MD Electronically signed by Deitra Mayo MD on 06/01/2022 at 4:28:35 PM.    Final    [place] EKG SITE RITE  Result Date: 05/31/2022 If Site Rite image not attached, placement could not be confirmed due to current cardiac rhythm.  CT ABDOMEN PELVIS W CONTRAST  Result Date: 05/29/2022 CLINICAL DATA:  Colon carcinoma staging EXAM: CT ABDOMEN AND PELVIS WITH CONTRAST TECHNIQUE: Multidetector CT imaging of the abdomen and pelvis was performed using the standard protocol following bolus administration of intravenous contrast. RADIATION DOSE REDUCTION: This exam was performed according to the departmental dose-optimization program which includes automated exposure control, adjustment of the mA and/or kV according to patient size and/or use of iterative reconstruction technique. CONTRAST:  176m OMNIPAQUE IOHEXOL 350 MG/ML SOLN COMPARISON:  None Available. FINDINGS: Lower chest: Heart is enlarged in size. Scattered coronary artery calcifications are seen. There are small linear densities in the lower lung fields suggesting minimal scarring or subsegmental atelectasis. There is no focal consolidation in the lower lung fields. There is reconstruction prosthesis in left breast. Hepatobiliary: No focal abnormalities are seen in liver. There is no dilation of bile ducts. There are large gallbladder stones. There is no wall thickening in gallbladder. There is no fluid around the gallbladder. Pancreas: No focal abnormalities are  seen. Spleen: Unremarkable. Adrenals/Urinary Tract: There is mild hyperplasia of adrenals without discrete nodules. There is no hydronephrosis. There is 5 mm calculus in the lower pole right kidney. Ureters are not dilated. Urinary bladder is unremarkable. Stomach/Bowel: Small hiatal hernia is seen. Stomach is not distended. There is no significant small bowel dilation. Appendix is not  seen. There is 4.5 x 2.6 cm asymmetric mass lesion in the cecum close to the ileocolic junction. This lesion has to be considered a malignant neoplasm. There is mild asymmetric wall thickening in hepatic flexure measuring 2.2 cm in length and 6 mm in thickness. Scattered diverticula are seen in colon without signs of focal acute diverticulitis. There is no pericolic stranding. Vascular/Lymphatic: Scattered arterial calcifications are seen in aorta and its major branches. There are few subcentimeter mesenteric nodes. In view of the mass seen in cecum, follow-up PET-CT may be considered for further characterization of the slightly enlarged mesenteric lymph nodes. Reproductive: Unremarkable. Other: There is no ascites or pneumoperitoneum. Umbilical hernia containing fat is seen. Maximum diameter of hernial sac measures 5.8 cm. Musculoskeletal: There is 50% decrease in height of body of L1 vertebra, most likely old fracture. There is mild retropulsion in the upper endplate of body of L1 vertebra. There is 30% decrease in height of upper endplate of body of L3 vertebra. This may be recent or old. There is previous right hip arthroplasty. IMPRESSION: There is 4.5 x 2.6 cm asymmetric wall thickening in cecum close to the ileocecal junction. This lesion has to be considered a malignant neoplasm. There are few subcentimeter mesenteric lymph nodes which may suggest incidental benign reactive hyperplasia. In view of the mass lesion in the cecum, PET-CT may be considered for further characterization of mesenteric nodes. There is small focus of  asymmetric wall thickening in hepatic flexure which may be an artifact due to incomplete distention or suggest neoplastic process. Please correlate with endoscopic findings. No focal abnormalities are seen in liver. 50% decrease in height of body of L1 vertebra most likely is old compression fracture. There is 30% decrease in height of upper endplate of body of L3 vertebra which may be recent or old fracture. If there are focal symptoms, follow-up MRI may be considered. There is no evidence of intestinal obstruction or pneumoperitoneum. There is no hydronephrosis. Large gallbladder stones without signs of acute cholecystitis or dilation of bile ducts. Small hiatal hernia. Diverticulosis of colon. Nonobstructing right renal calculus. There is moderate sized umbilical hernia containing fat. Aortic arteriosclerosis. Coronary artery calcifications are seen. Other findings as described in the body of the report. Electronically Signed   By: Elmer Picker M.D.   On: 05/29/2022 20:08   CT CHEST WO CONTRAST  Result Date: 05/27/2022 CLINICAL DATA:  Chronic/persisting cough. EXAM: CT CHEST WITHOUT CONTRAST TECHNIQUE: Multidetector CT imaging of the chest was performed following the standard protocol without IV contrast. RADIATION DOSE REDUCTION: This exam was performed according to the departmental dose-optimization program which includes automated exposure control, adjustment of the mA and/or kV according to patient size and/or use of iterative reconstruction technique. COMPARISON:  None Available. FINDINGS: Cardiovascular: The heart is enlarged and there is no pericardial effusion. Three-vessel coronary artery calcifications are noted. There is atherosclerotic calcification of the aorta without evidence of aneurysm. The pulmonary trunk is normal in caliber. Mediastinum/Nodes: No mediastinal or axillary lymphadenopathy. Evaluation of the hila is limited due to lack of IV contrast. The trachea and esophagus are  within normal limits. There is a 1.2 cm nodule in the thyroid isthmus. There is a small hiatal hernia. Lungs/Pleura: Mild apical pleural scarring is present bilaterally. Mild atelectasis or scarring is noted bilaterally. No consolidation, effusion, or pneumothorax. Upper Abdomen: No acute abnormality. Musculoskeletal: A left breast implant is noted. Degenerative changes are present in the thoracic spine. IMPRESSION: 1. No acute process. 2. Small hiatal  hernia. 3. Aortic atherosclerosis and coronary artery calcifications. Electronically Signed   By: Brett Fairy M.D.   On: 05/27/2022 21:45   DG Chest 1 View  Result Date: 05/27/2022 CLINICAL DATA:  Malaise symptoms for 1 week. EXAM: CHEST  1 VIEW COMPARISON:  None Available. FINDINGS: The heart size and mediastinal contours are within normal limits. Both lungs are clear. The visualized skeletal structures are unremarkable. IMPRESSION: No active disease. Electronically Signed   By: Nolon Nations M.D.   On: 05/27/2022 11:50   DG Chest 2 View  Result Date: 05/26/2022 CLINICAL DATA:  Cough for years. EXAM: CHEST - 2 VIEW COMPARISON:  February 16, 2013 FINDINGS: Stable cardiomegaly. The hila and mediastinum are normal. No pneumothorax. No nodules or masses. No focal infiltrates. No other acute abnormalities. IMPRESSION: No active cardiopulmonary disease. Electronically Signed   By: Dorise Bullion III M.D.   On: 05/26/2022 13:48    Microbiology: Results for orders placed or performed during the hospital encounter of 05/27/22  Resp panel by RT-PCR (RSV, Flu A&B, Covid) Anterior Nasal Swab     Status: None   Collection Time: 05/27/22 11:15 AM   Specimen: Anterior Nasal Swab  Result Value Ref Range Status   SARS Coronavirus 2 by RT PCR NEGATIVE NEGATIVE Final    Comment: (NOTE) SARS-CoV-2 target nucleic acids are NOT DETECTED.  The SARS-CoV-2 RNA is generally detectable in upper respiratory specimens during the acute phase of infection. The  lowest concentration of SARS-CoV-2 viral copies this assay can detect is 138 copies/mL. A negative result does not preclude SARS-Cov-2 infection and should not be used as the sole basis for treatment or other patient management decisions. A negative result may occur with  improper specimen collection/handling, submission of specimen other than nasopharyngeal swab, presence of viral mutation(s) within the areas targeted by this assay, and inadequate number of viral copies(<138 copies/mL). A negative result must be combined with clinical observations, patient history, and epidemiological information. The expected result is Negative.  Fact Sheet for Patients:  EntrepreneurPulse.com.au  Fact Sheet for Healthcare Providers:  IncredibleEmployment.be  This test is no t yet approved or cleared by the [place] FDA and  has been authorized for detection and/or diagnosis of SARS-CoV-2 by FDA under an Emergency Use Authorization (EUA). This EUA will remain  in effect (meaning this test can be used) for the duration of the COVID-19 declaration under Section 564(b)(1) of the Act, 21 U.S.C.section 360bbb-3(b)(1), unless the authorization is terminated  or revoked sooner.       Influenza A by PCR NEGATIVE NEGATIVE Final   Influenza B by PCR NEGATIVE NEGATIVE Final    Comment: (NOTE) The Xpert Xpress SARS-CoV-2/FLU/RSV plus assay is intended as an aid in the diagnosis of influenza from Nasopharyngeal swab specimens and should not be used as a sole basis for treatment. Nasal washings and aspirates are unacceptable for Xpert Xpress SARS-CoV-2/FLU/RSV testing.  Fact Sheet for Patients: EntrepreneurPulse.com.au  Fact Sheet for Healthcare Providers: IncredibleEmployment.be  This test is not yet approved or cleared by the [place] FDA and has been authorized for detection and/or diagnosis of SARS-CoV-2 by FDA under  an Emergency Use Authorization (EUA). This EUA will remain in effect (meaning this test can be used) for the duration of the COVID-19 declaration under Section 564(b)(1) of the Act, 21 U.S.C. section 360bbb-3(b)(1), unless the authorization is terminated or revoked.     Resp Syncytial Virus by PCR NEGATIVE NEGATIVE Final    Comment: (NOTE) Fact Sheet for  Patients: EntrepreneurPulse.com.au  Fact Sheet for Healthcare Providers: IncredibleEmployment.be  This test is not yet approved or cleared by the [place] FDA and has been authorized for detection and/or diagnosis of SARS-CoV-2 by FDA under an Emergency Use Authorization (EUA). This EUA will remain in effect (meaning this test can be used) for the duration of the COVID-19 declaration under Section 564(b)(1) of the Act, 21 U.S.C. section 360bbb-3(b)(1), unless the authorization is terminated or revoked.  Performed at [facility], [place] [address] [place], [place] [address]   Surgical pcr screen     Status: Abnormal   Collection Time: 06/03/22  8:12 PM   Specimen: Nasal Mucosa; Nasal Swab  Result Value Ref Range Status   MRSA, PCR NEGATIVE NEGATIVE Final   Staphylococcus aureus POSITIVE (A) NEGATIVE Final    Comment: (NOTE) The Xpert SA Assay (FDA approved for NASAL specimens in patients 25 years of age and older), is one component of a comprehensive surveillance program. It is not intended to diagnose infection nor to guide or monitor treatment. Performed at [facility], [place] [address], [place], [place] [address]   Resp panel by RT-PCR (RSV, Flu A&B, Covid) Anterior Nasal Swab     Status: Abnormal   Collection Time: 06/07/22 10:09 AM   Specimen: Anterior Nasal Swab  Result Value Ref Range Status   SARS Coronavirus 2 by RT PCR NEGATIVE NEGATIVE Final    Comment: (NOTE) SARS-CoV-2 target nucleic acids are NOT DETECTED.  The SARS-CoV-2 RNA is generally detectable in  upper respiratory specimens during the acute phase of infection. The lowest concentration of SARS-CoV-2 viral copies this assay can detect is 138 copies/mL. A negative result does not preclude SARS-Cov-2 infection and should not be used as the sole basis for treatment or other patient management decisions. A negative result may occur with  improper specimen collection/handling, submission of specimen other than nasopharyngeal swab, presence of viral mutation(s) within the areas targeted by this assay, and inadequate number of viral copies(<138 copies/mL). A negative result must be combined with clinical observations, patient history, and epidemiological information. The expected result is Negative.  Fact Sheet for Patients:  EntrepreneurPulse.com.au  Fact Sheet for Healthcare Providers:  IncredibleEmployment.be  This test is no t yet approved or cleared by the [place] FDA and  has been authorized for detection and/or diagnosis of SARS-CoV-2 by FDA under an Emergency Use Authorization (EUA). This EUA will remain  in effect (meaning this test can be used) for the duration of the COVID-19 declaration under Section 564(b)(1) of the Act, 21 U.S.C.section 360bbb-3(b)(1), unless the authorization is terminated  or revoked sooner.       Influenza A by PCR NEGATIVE NEGATIVE Final   Influenza B by PCR POSITIVE (A) NEGATIVE Final    Comment: (NOTE) The Xpert Xpress SARS-CoV-2/FLU/RSV plus assay is intended as an aid in the diagnosis of influenza from Nasopharyngeal swab specimens and should not be used as a sole basis for treatment. Nasal washings and aspirates are unacceptable for Xpert Xpress SARS-CoV-2/FLU/RSV testing.  Fact Sheet for Patients: EntrepreneurPulse.com.au  Fact Sheet for Healthcare Providers: IncredibleEmployment.be  This test is not yet approved or cleared by the [place] FDA and has  been authorized for detection and/or diagnosis of SARS-CoV-2 by FDA under an Emergency Use Authorization (EUA). This EUA will remain in effect (meaning this test can be used) for the duration of the COVID-19 declaration under Section 564(b)(1) of the Act, 21 U.S.C. section 360bbb-3(b)(1), unless the authorization  is terminated or revoked.     Resp Syncytial Virus by PCR NEGATIVE NEGATIVE Final    Comment: (NOTE) Fact Sheet for Patients: EntrepreneurPulse.com.au  Fact Sheet for Healthcare Providers: IncredibleEmployment.be  This test is not yet approved or cleared by the [place] FDA and has been authorized for detection and/or diagnosis of SARS-CoV-2 by FDA under an Emergency Use Authorization (EUA). This EUA will remain in effect (meaning this test can be used) for the duration of the COVID-19 declaration under Section 564(b)(1) of the Act, 21 U.S.C. section 360bbb-3(b)(1), unless the authorization is terminated or revoked.  Performed at [facility], [place] [address] [place], [place] [address]     Labs: CBC: Recent Labs  Lab 06/06/22 [phone] 06/07/22 0321 06/08/22 0450 06/09/22 0950 06/12/22 0845  WBC 10.7* 6.8 6.6 7.7  --   NEUTROABS  --  4.9 4.6 5.0  --   HGB 8.5* 8.9* 8.2* 8.2* 8.7*  HCT 28.5* 30.5* 28.8* 28.6* 28.2*  MCV 82.4 83.3 86.5 87.2  --   PLT 245 266 261 292  --    Basic Metabolic Panel: Recent Labs  Lab 06/06/22 0443 06/07/22 0321 06/08/22 0450 06/09/22 0950  NA 136 140 140 134*  K 3.5 3.3* 3.5 3.7  CL 103 102 103 103  CO2 '26 27 28 28  '$ GLUCOSE 94 90 85 89  BUN '11 10 12 13  '$ CREATININE 0.95 1.02* 1.06* 0.98  CALCIUM 7.9* 8.0* 7.8* 7.4*  MG  --  2.1 2.4 2.2  PHOS  --  3.2 3.9 3.2   Liver Function Tests: Recent Labs  Lab 06/07/22 0321 06/08/22 0450 06/09/22 0950  AST '31 30 26  '$ ALT '17 14 16  '$ ALKPHOS 50 45 46  BILITOT 0.6 0.6 0.5  PROT 5.6* 5.2* 5.3*  ALBUMIN 2.5* 2.3* 2.2*   CBG: No  results for input(s): "GLUCAP" in the last 168 hours.  Discharge time spent: greater than 30 minutes.  Signed: [name], MD [facility] 06/12/2022

## 2022-06-12 NOTE — TOC Progression Note (Signed)
Transition of Care [facility]) - Progression Note    Patient Details  Name: Natasha Dickson MRN: [id number] Date of Birth: 28-Jun-1945  Transition of Care [facility]) CM/SW [place], [place] Phone Number: 06/12/2022, 10:11 AM  Clinical Narrative:     CSW received call from patients spouse requesting for CSW to check and see if [place] with [facility] had SNF bed available for patient. CSW spoke with [place] with [facility] who informed CSW no bed availability today. CSW informed patients spouse. Patient spouse confirmed he would like to see if [facility] has SNF bed available. CSW spoke with [place] and [place] with [facility] who confirmed they have SNF bed available today if patient is medically ready for dc. CSW informed MD.CSW will continue to follow and assist with patients dc planning needs.  Expected Discharge Plan: Skilled Nursing Facility Barriers to Discharge: No Barriers Identified  Expected Discharge Plan and Services In-house Referral: NA Discharge Planning Services: CM Consult Post Acute Care Choice: [place] arrangements for the past 2 months: Single Family Home Expected Discharge Date: 06/12/22               DME Arranged: Gilford Rile rolling, Bedside commode DME Agency: [employer] Date DME Agency Contacted: 06/10/22 Time DME Agency Contacted: 65   HH Arranged: RN, Disease Management, PT, OT, Nurse's Aide, Social Work [employer] Agency: [place] Date [place]: 06/10/22 Time [place]: 1527 Representative spoke with at [place]: [place] Determinants of Health ([place]) Interventions SDOH Screenings   Food Insecurity: No Food Insecurity (10/24/2021)  Housing: Low Risk  (10/24/2021)  Transportation Needs: No Transportation Needs (10/24/2021)  Alcohol Screen: Low Risk  (10/24/2021)  Depression (PHQ2-9): Low Risk  (10/24/2021)  Financial Resource Strain: Low Risk  (10/24/2021)  Physical Activity: Inactive (10/24/2021)  Social  Connections: Moderately Isolated (10/24/2021)  Stress: No Stress Concern Present (10/24/2021)  Tobacco Use: Low Risk  (06/05/2022)    Readmission Risk Interventions     No data to display

## 2022-06-12 NOTE — TOC Transition Note (Signed)
Transition of Care [facility]) - CM/SW Discharge Note   Patient Details  Name: Natasha Dickson MRN: [id number] Date of Birth: [birth year]  [facility]) CM/SW Contact:  Milas Gain, [place] Phone Number: 06/12/2022, 11:38 AM   Clinical Narrative:     Patient will DC to: [place] date: 06/12/2022  Family notified: Lorri Frederick by: Corey Harold   ?  Per MD patient ready for DC to [facility] . RN, patient, patient's family, and facility notified of DC. Discharge Summary sent to facility. RN given number for report tele# [phone] RM# [phone]. DC packet on chart. Ambulance transport requested for patient.  CSW signing off.   Final next level of care: Skilled Nursing Facility Barriers to Discharge: No Barriers Identified   Patient Goals and CMS Choice CMS Medicare.gov Compare Post Acute Care list provided to:: Patient (patient and patients spouse) Choice offered to / list presented to : Patient (patient and patients spouse)  Discharge Placement                Patient chooses bed at: [facility] Patient to be transferred to facility by: [place] Name of family member notified: Shanon Brow Patient and family notified of of transfer: 06/12/22  Discharge Plan and Services Additional resources added to the After Visit Summary for   In-house Referral: NA Discharge Planning Services: CM Consult Post Acute Care Choice: Home Health          DME Arranged: Gilford Rile rolling, Bedside commode DME Agency: [employer] Date DME Agency Contacted: 06/10/22 Time DME Agency Contacted: [phone]   [place]: RN, Disease Management, PT, OT, Nurse's Aide, Social Work [employer] Agency: [place] Date [place] at [place]: 06/10/22 Time [place]: 1527 Representative spoke with at [place]: [place] Determinants of Health ([place]) Interventions SDOH Screenings   Food Insecurity: No Food Insecurity  (10/24/2021)  Housing: Low Risk  (10/24/2021)  Transportation Needs: No Transportation Needs (10/24/2021)  Alcohol Screen: Low Risk  (10/24/2021)  Depression (PHQ2-9): Low Risk  (10/24/2021)  Financial Resource Strain: Low Risk  (10/24/2021)  Physical Activity: Inactive (10/24/2021)  Social Connections: Moderately Isolated (10/24/2021)  Stress: No Stress Concern Present (10/24/2021)  Tobacco Use: Low Risk  (06/05/2022)     Readmission Risk Interventions     No data to display

## 2022-06-13 DIAGNOSIS — E538 Deficiency of other specified B group vitamins: Secondary | ICD-10-CM | POA: Diagnosis not present

## 2022-06-13 DIAGNOSIS — E785 Hyperlipidemia, unspecified: Secondary | ICD-10-CM | POA: Diagnosis not present

## 2022-06-13 DIAGNOSIS — C189 Malignant neoplasm of colon, unspecified: Secondary | ICD-10-CM | POA: Diagnosis not present

## 2022-06-13 DIAGNOSIS — I5043 Acute on chronic combined systolic (congestive) and diastolic (congestive) heart failure: Secondary | ICD-10-CM | POA: Diagnosis not present

## 2022-06-13 DIAGNOSIS — J9601 Acute respiratory failure with hypoxia: Secondary | ICD-10-CM | POA: Diagnosis not present

## 2022-06-13 DIAGNOSIS — D500 Iron deficiency anemia secondary to blood loss (chronic): Secondary | ICD-10-CM | POA: Diagnosis not present

## 2022-06-13 DIAGNOSIS — Z48815 Encounter for surgical aftercare following surgery on the digestive system: Secondary | ICD-10-CM | POA: Diagnosis not present

## 2022-06-13 DIAGNOSIS — D509 Iron deficiency anemia, unspecified: Secondary | ICD-10-CM | POA: Diagnosis not present

## 2022-06-13 DIAGNOSIS — R54 Age-related physical debility: Secondary | ICD-10-CM | POA: Diagnosis not present

## 2022-06-13 DIAGNOSIS — I4819 Other persistent atrial fibrillation: Secondary | ICD-10-CM | POA: Diagnosis not present

## 2022-06-17 DIAGNOSIS — J9601 Acute respiratory failure with hypoxia: Secondary | ICD-10-CM | POA: Diagnosis not present

## 2022-06-17 DIAGNOSIS — C189 Malignant neoplasm of colon, unspecified: Secondary | ICD-10-CM | POA: Diagnosis not present

## 2022-06-17 DIAGNOSIS — D500 Iron deficiency anemia secondary to blood loss (chronic): Secondary | ICD-10-CM | POA: Diagnosis not present

## 2022-06-17 DIAGNOSIS — M6281 Muscle weakness (generalized): Secondary | ICD-10-CM | POA: Diagnosis not present

## 2022-06-17 DIAGNOSIS — E538 Deficiency of other specified B group vitamins: Secondary | ICD-10-CM | POA: Diagnosis not present

## 2022-06-17 DIAGNOSIS — Z48815 Encounter for surgical aftercare following surgery on the digestive system: Secondary | ICD-10-CM | POA: Diagnosis not present

## 2022-06-17 DIAGNOSIS — E785 Hyperlipidemia, unspecified: Secondary | ICD-10-CM | POA: Diagnosis not present

## 2022-06-17 DIAGNOSIS — I4819 Other persistent atrial fibrillation: Secondary | ICD-10-CM | POA: Diagnosis not present

## 2022-06-17 DIAGNOSIS — I5043 Acute on chronic combined systolic (congestive) and diastolic (congestive) heart failure: Secondary | ICD-10-CM | POA: Diagnosis not present

## 2022-06-19 ENCOUNTER — Other Ambulatory Visit: Payer: Self-pay | Admitting: *Deleted

## 2022-06-19 DIAGNOSIS — C189 Malignant neoplasm of colon, unspecified: Secondary | ICD-10-CM | POA: Diagnosis not present

## 2022-06-19 DIAGNOSIS — I4819 Other persistent atrial fibrillation: Secondary | ICD-10-CM | POA: Diagnosis not present

## 2022-06-19 DIAGNOSIS — I5043 Acute on chronic combined systolic (congestive) and diastolic (congestive) heart failure: Secondary | ICD-10-CM | POA: Diagnosis not present

## 2022-06-19 DIAGNOSIS — D500 Iron deficiency anemia secondary to blood loss (chronic): Secondary | ICD-10-CM | POA: Diagnosis not present

## 2022-06-19 DIAGNOSIS — I10 Essential (primary) hypertension: Secondary | ICD-10-CM

## 2022-06-19 NOTE — Patient Outreach (Signed)
[place] Coordinator follow up. Screening for potential [facility] care coordination services as benefit of insurance plan and Primary Care Provider.  Facility site visit to [facility] skilled nursing facility. Met with Marita Kansas, Education officer, museum. Marita Kansas reports Mrs. Stander transitioned to home today 06/19/22 with [facility]. Lives with spouse.   Mrs. Dizon has medical history for CHF, colon cancer, atrial fibrillation, cardiomyopathy, HTN, generalized weakness, anemia.   Will make referral to [facility] care coordination team for RN follow up.   Marthenia Rolling, MSN, RN,BSN [place] Acute Care Coordinator [phone] (Direct dial)

## 2022-06-20 ENCOUNTER — Telehealth: Payer: Self-pay | Admitting: *Deleted

## 2022-06-20 ENCOUNTER — Telehealth: Payer: Self-pay | Admitting: Family Medicine

## 2022-06-20 ENCOUNTER — Telehealth: Payer: Self-pay | Admitting: Cardiology

## 2022-06-20 DIAGNOSIS — G3184 Mild cognitive impairment, so stated: Secondary | ICD-10-CM | POA: Diagnosis not present

## 2022-06-20 DIAGNOSIS — Z483 Aftercare following surgery for neoplasm: Secondary | ICD-10-CM | POA: Diagnosis not present

## 2022-06-20 DIAGNOSIS — N200 Calculus of kidney: Secondary | ICD-10-CM | POA: Diagnosis not present

## 2022-06-20 DIAGNOSIS — I951 Orthostatic hypotension: Secondary | ICD-10-CM | POA: Diagnosis not present

## 2022-06-20 DIAGNOSIS — J9601 Acute respiratory failure with hypoxia: Secondary | ICD-10-CM | POA: Diagnosis not present

## 2022-06-20 DIAGNOSIS — E785 Hyperlipidemia, unspecified: Secondary | ICD-10-CM | POA: Diagnosis not present

## 2022-06-20 DIAGNOSIS — I82A11 Acute embolism and thrombosis of right axillary vein: Secondary | ICD-10-CM | POA: Diagnosis not present

## 2022-06-20 DIAGNOSIS — K449 Diaphragmatic hernia without obstruction or gangrene: Secondary | ICD-10-CM | POA: Diagnosis not present

## 2022-06-20 DIAGNOSIS — K8020 Calculus of gallbladder without cholecystitis without obstruction: Secondary | ICD-10-CM | POA: Diagnosis not present

## 2022-06-20 DIAGNOSIS — I4819 Other persistent atrial fibrillation: Secondary | ICD-10-CM | POA: Diagnosis not present

## 2022-06-20 DIAGNOSIS — N1831 Chronic kidney disease, stage 3a: Secondary | ICD-10-CM | POA: Diagnosis not present

## 2022-06-20 DIAGNOSIS — C180 Malignant neoplasm of cecum: Secondary | ICD-10-CM | POA: Diagnosis not present

## 2022-06-20 DIAGNOSIS — I82613 Acute embolism and thrombosis of superficial veins of upper extremity, bilateral: Secondary | ICD-10-CM | POA: Diagnosis not present

## 2022-06-20 DIAGNOSIS — I5043 Acute on chronic combined systolic (congestive) and diastolic (congestive) heart failure: Secondary | ICD-10-CM | POA: Diagnosis not present

## 2022-06-20 DIAGNOSIS — I130 Hypertensive heart and chronic kidney disease with heart failure and stage 1 through stage 4 chronic kidney disease, or unspecified chronic kidney disease: Secondary | ICD-10-CM | POA: Diagnosis not present

## 2022-06-20 DIAGNOSIS — D62 Acute posthemorrhagic anemia: Secondary | ICD-10-CM | POA: Diagnosis not present

## 2022-06-20 DIAGNOSIS — E538 Deficiency of other specified B group vitamins: Secondary | ICD-10-CM | POA: Diagnosis not present

## 2022-06-20 DIAGNOSIS — D630 Anemia in neoplastic disease: Secondary | ICD-10-CM | POA: Diagnosis not present

## 2022-06-20 DIAGNOSIS — K2990 Gastroduodenitis, unspecified, without bleeding: Secondary | ICD-10-CM | POA: Diagnosis not present

## 2022-06-20 DIAGNOSIS — K429 Umbilical hernia without obstruction or gangrene: Secondary | ICD-10-CM | POA: Diagnosis not present

## 2022-06-20 DIAGNOSIS — I2510 Atherosclerotic heart disease of native coronary artery without angina pectoris: Secondary | ICD-10-CM | POA: Diagnosis not present

## 2022-06-20 DIAGNOSIS — I428 Other cardiomyopathies: Secondary | ICD-10-CM | POA: Diagnosis not present

## 2022-06-20 DIAGNOSIS — I82621 Acute embolism and thrombosis of deep veins of right upper extremity: Secondary | ICD-10-CM | POA: Diagnosis not present

## 2022-06-20 DIAGNOSIS — C50912 Malignant neoplasm of unspecified site of left female breast: Secondary | ICD-10-CM | POA: Diagnosis not present

## 2022-06-20 DIAGNOSIS — D631 Anemia in chronic kidney disease: Secondary | ICD-10-CM | POA: Diagnosis not present

## 2022-06-20 NOTE — Telephone Encounter (Signed)
Called home health nurse. She stated patient was discharged from nursing facility last night and patient does not have any xarelto 15 mg to take BID until she goes back to xarelto 20 mg by mouth daily. Since patient only has 20 mg dose, she wanted to know if Patient could take 20 mg (1 tablet) in the morning and at night take 10 mg (1/2 tablet) to equal the 30 mg total for the day. Will forward to pharmD for advisement. Will check to see if our office has samples of xarelto 15 mg.

## 2022-06-20 NOTE — Progress Notes (Signed)
  Care Coordination  Outreach Note  06/20/2022 Name: Natasha Dickson MRN: [id number] DOB: [birth year]   Care Coordination Outreach Attempts: An unsuccessful telephone outreach was attempted today to offer the patient information about available care coordination services as a benefit of their health plan.   Referral received   Follow Up Plan:  Additional outreach attempts will be made to offer the patient care coordination information and services.   Encounter Outcome:  No Answer  Julian Hy, [place] Direct Dial: [phone]

## 2022-06-20 NOTE — Telephone Encounter (Signed)
Home Health verbal orders [place] number: [phone]  Requesting OT/PT/Skilled nursing/Social Work/Speech:  Reason:Skilled Nursing  Frequency: 1 X W -  5 W  Please forward to [facility] pool or providers CMA

## 2022-06-20 NOTE — Telephone Encounter (Signed)
Verbal orders given to Natasha Dickson.  

## 2022-06-20 NOTE — Telephone Encounter (Signed)
Pt c/o medication issue:  1. Name of Medication:   Rivaroxaban (XARELTO) 15 MG TABS tablet    2. How are you currently taking this medication (dosage and times per day)?   3. Are you having a reaction (difficulty breathing--STAT)?   4. What is your medication issue? Pt home health nurse called stating pt was discharged from rehab and they changed her xarelto to taking '15mg'$  twice daily until 06/26/22. Pt doesn't have enough to last until then but she does have '20mg'$  tablets at home. They want to know if its okay for her to take 1.5 tablets of those instead of having to pay for more 15 mg tablets.

## 2022-06-20 NOTE — Telephone Encounter (Signed)
Please give the order.  Thanks.   

## 2022-06-21 DIAGNOSIS — J9601 Acute respiratory failure with hypoxia: Secondary | ICD-10-CM | POA: Diagnosis not present

## 2022-06-21 DIAGNOSIS — C180 Malignant neoplasm of cecum: Secondary | ICD-10-CM | POA: Diagnosis not present

## 2022-06-21 DIAGNOSIS — C50912 Malignant neoplasm of unspecified site of left female breast: Secondary | ICD-10-CM | POA: Diagnosis not present

## 2022-06-21 DIAGNOSIS — Z483 Aftercare following surgery for neoplasm: Secondary | ICD-10-CM | POA: Diagnosis not present

## 2022-06-21 DIAGNOSIS — I4819 Other persistent atrial fibrillation: Secondary | ICD-10-CM | POA: Diagnosis not present

## 2022-06-21 DIAGNOSIS — D630 Anemia in neoplastic disease: Secondary | ICD-10-CM | POA: Diagnosis not present

## 2022-06-24 DIAGNOSIS — C180 Malignant neoplasm of cecum: Secondary | ICD-10-CM | POA: Diagnosis not present

## 2022-06-24 DIAGNOSIS — C50912 Malignant neoplasm of unspecified site of left female breast: Secondary | ICD-10-CM | POA: Diagnosis not present

## 2022-06-24 DIAGNOSIS — J9601 Acute respiratory failure with hypoxia: Secondary | ICD-10-CM | POA: Diagnosis not present

## 2022-06-24 DIAGNOSIS — Z483 Aftercare following surgery for neoplasm: Secondary | ICD-10-CM | POA: Diagnosis not present

## 2022-06-24 DIAGNOSIS — I4819 Other persistent atrial fibrillation: Secondary | ICD-10-CM | POA: Diagnosis not present

## 2022-06-24 DIAGNOSIS — D630 Anemia in neoplastic disease: Secondary | ICD-10-CM | POA: Diagnosis not present

## 2022-06-24 NOTE — Telephone Encounter (Signed)
I had called them back that day and let them know, that they should not cut the pills in half and that we did have samples. The patient picked-up samples.

## 2022-06-24 NOTE — Telephone Encounter (Signed)
I'm sorry for the delay, did we give the patient samples of '15mg'$ ?

## 2022-06-25 DIAGNOSIS — C50912 Malignant neoplasm of unspecified site of left female breast: Secondary | ICD-10-CM | POA: Diagnosis not present

## 2022-06-25 DIAGNOSIS — D630 Anemia in neoplastic disease: Secondary | ICD-10-CM | POA: Diagnosis not present

## 2022-06-25 DIAGNOSIS — C180 Malignant neoplasm of cecum: Secondary | ICD-10-CM | POA: Diagnosis not present

## 2022-06-25 DIAGNOSIS — Z483 Aftercare following surgery for neoplasm: Secondary | ICD-10-CM | POA: Diagnosis not present

## 2022-06-25 DIAGNOSIS — J9601 Acute respiratory failure with hypoxia: Secondary | ICD-10-CM | POA: Diagnosis not present

## 2022-06-25 DIAGNOSIS — I4819 Other persistent atrial fibrillation: Secondary | ICD-10-CM | POA: Diagnosis not present

## 2022-06-25 NOTE — Progress Notes (Signed)
  Care Coordination   Note   06/25/2022 Name: DELRAE HAGEY MRN: [id number] DOB: 1946/01/27  SHELIAH FIORILLO is a 77 y.o. year old female who sees Tonia Ghent, MD for primary care. I reached out to [name] by phone today to offer care coordination services.  Ms. Ramberg was given information about Care Coordination services today including:   The Care Coordination services include support from the care team which includes your Nurse Coordinator, Clinical Social Worker, or Pharmacist.  The Care Coordination team is here to help remove barriers to the health concerns and goals most important to you. Care Coordination services are voluntary, and the patient may decline or stop services at any time by request to their care team member.   Care Coordination Consent Status: Patient agreed to services and verbal consent obtained.   Follow up plan:  Telephone appointment with care coordination team member scheduled for:  07/09/2022  Encounter Outcome:  Pt. Scheduled from referral   Julian Hy, [place] Direct Dial: [phone]

## 2022-06-25 NOTE — Progress Notes (Signed)
  Care Coordination  Outreach Note  06/25/2022 Name: Natasha Dickson MRN: [id number] DOB: 1946-01-09   Care Coordination Outreach Attempts: A second unsuccessful outreach was attempted today to offer the patient with information about available care coordination services as a benefit of their health plan.     Referral received   Follow Up Plan:  Additional outreach attempts will be made to offer the patient care coordination information and services.   Encounter Outcome:  No Answer  Julian Hy, [place] Direct Dial: [phone]

## 2022-06-26 DIAGNOSIS — D630 Anemia in neoplastic disease: Secondary | ICD-10-CM | POA: Diagnosis not present

## 2022-06-26 DIAGNOSIS — I4819 Other persistent atrial fibrillation: Secondary | ICD-10-CM | POA: Diagnosis not present

## 2022-06-26 DIAGNOSIS — C50912 Malignant neoplasm of unspecified site of left female breast: Secondary | ICD-10-CM | POA: Diagnosis not present

## 2022-06-26 DIAGNOSIS — C180 Malignant neoplasm of cecum: Secondary | ICD-10-CM | POA: Diagnosis not present

## 2022-06-26 DIAGNOSIS — Z483 Aftercare following surgery for neoplasm: Secondary | ICD-10-CM | POA: Diagnosis not present

## 2022-06-26 DIAGNOSIS — J9601 Acute respiratory failure with hypoxia: Secondary | ICD-10-CM | POA: Diagnosis not present

## 2022-06-27 ENCOUNTER — Telehealth: Payer: Self-pay | Admitting: *Deleted

## 2022-06-27 ENCOUNTER — Ambulatory Visit (INDEPENDENT_AMBULATORY_CARE_PROVIDER_SITE_OTHER): Payer: Medicare Other | Admitting: Family Medicine

## 2022-06-27 ENCOUNTER — Encounter: Payer: Self-pay | Admitting: Family Medicine

## 2022-06-27 VITALS — BP 104/62 | HR 104 | Temp 97.6°F | Ht 61.0 in | Wt 198.0 lb

## 2022-06-27 DIAGNOSIS — E876 Hypokalemia: Secondary | ICD-10-CM

## 2022-06-27 DIAGNOSIS — K6389 Other specified diseases of intestine: Secondary | ICD-10-CM

## 2022-06-27 DIAGNOSIS — E538 Deficiency of other specified B group vitamins: Secondary | ICD-10-CM | POA: Diagnosis not present

## 2022-06-27 DIAGNOSIS — I4891 Unspecified atrial fibrillation: Secondary | ICD-10-CM | POA: Diagnosis not present

## 2022-06-27 DIAGNOSIS — D649 Anemia, unspecified: Secondary | ICD-10-CM

## 2022-06-27 LAB — COMPREHENSIVE METABOLIC PANEL
ALT: 15 U/L (ref 0–35)
AST: 26 U/L (ref 0–37)
Albumin: 3.8 g/dL (ref 3.5–5.2)
Alkaline Phosphatase: 61 U/L (ref 39–117)
BUN: 11 mg/dL (ref 6–23)
CO2: 26 mEq/L (ref 19–32)
Calcium: 9.3 mg/dL (ref 8.4–10.5)
Chloride: 103 mEq/L (ref 96–112)
Creatinine, Ser: 1.19 mg/dL (ref 0.40–1.20)
GFR: 44.49 mL/min — ABNORMAL LOW (ref >60.00)
Glucose, Bld: 99 mg/dL (ref 70–99)
Potassium: 2.8 mEq/L — CL (ref 3.5–5.1)
Sodium: 139 mEq/L (ref 135–145)
Total Bilirubin: 1.5 mg/dL — ABNORMAL HIGH (ref 0.2–1.2)
Total Protein: 7.5 g/dL (ref 6.0–8.3)

## 2022-06-27 LAB — CBC WITH DIFFERENTIAL/PLATELET
Basophils Absolute: 0 10*3/uL (ref 0.0–0.1)
Basophils Relative: 0.7 % (ref 0.0–3.0)
Eosinophils Absolute: 0.2 10*3/uL (ref 0.0–0.7)
Eosinophils Relative: 2.7 % (ref 0.0–5.0)
HCT: 35.3 % — ABNORMAL LOW (ref 36.0–46.0)
Hemoglobin: 10.9 g/dL — ABNORMAL LOW (ref 12.0–15.0)
Lymphocytes Relative: 24.1 % (ref 12.0–46.0)
Lymphs Abs: 1.4 10*3/uL (ref 0.7–4.0)
MCHC: 30.8 g/dL (ref 30.0–36.0)
MCV: 81.4 fl (ref 78.0–100.0)
Monocytes Absolute: 0.8 10*3/uL (ref 0.1–1.0)
Monocytes Relative: 13.2 % — ABNORMAL HIGH (ref 3.0–12.0)
Neutro Abs: 3.4 10*3/uL (ref 1.4–7.7)
Neutrophils Relative %: 59.3 % (ref 43.0–77.0)
Platelets: 422 10*3/uL — ABNORMAL HIGH (ref 150.0–400.0)
RBC: 4.34 Mil/uL (ref 3.87–5.11)
RDW: 24.2 % — ABNORMAL HIGH (ref 11.5–15.5)
WBC: 5.7 10*3/uL (ref 4.0–10.5)

## 2022-06-27 LAB — VITAMIN B12: Vitamin B-12: 1493 pg/mL — ABNORMAL HIGH (ref 211–911)

## 2022-06-27 MED ORDER — POTASSIUM CHLORIDE CRYS ER 10 MEQ PO TBCR: 10.0000 meq | EXTENDED_RELEASE_TABLET | Freq: Two times a day (BID) | ORAL | 0 refills | Status: DC

## 2022-06-27 MED ORDER — LOPERAMIDE HCL 2 MG PO TABS: 1.0000 mg | ORAL_TABLET | Freq: Three times a day (TID) | ORAL | Status: DC | PRN

## 2022-06-27 MED ORDER — RIVAROXABAN 20 MG PO TABS: 20.0000 mg | ORAL_TABLET | Freq: Every day | ORAL | Status: DC

## 2022-06-27 MED ORDER — AMIODARONE HCL 200 MG PO TABS: 200.0000 mg | ORAL_TABLET | Freq: Every day | ORAL | Status: DC

## 2022-06-27 NOTE — Patient Instructions (Addendum)
07/02/2022  9:00 AM 07/02/2022  9:10 AM   Stechschulte, Vonita Moss, MD General Surgery NPI: [id number] [place] [address]   Phone: [phone] Fax: [phone]  =======================================   Go to the lab on the way out.   If you have mychart we'll likely use that to update you.    Take care.  Glad to see you.

## 2022-06-27 NOTE — Telephone Encounter (Signed)
Please call pt.  Will finalize the result note when possible but K is low.  Needs to start potassium chloride '10mg'$  Eq BID and recheck labs on Monday.  Rx sent.  Please set up nonfasting lab visit.  Thanks.

## 2022-06-27 NOTE — Progress Notes (Signed)
Recommendations at discharge:  Follow up with PCP in 1-2 weeks from SNF discharge. Suggest monitoring CBC and BMP at least weekly post discharge until stable.  Follow up with oncology in 1 month for discussion of management of colon CA. Follow up with general surgery s/p colectomy 12/26.  Follow up with cardiology in 1-2 weeks to continue management of AFib with RVR. Discharged on metoprolol, xarelto, and amiodarone.    Discharge Diagnoses: Principal Problem:   Atrial fibrillation with RVR (Natasha Dickson) Active Problems:   B12 deficiency   Weakness   (HFpEF) heart failure with preserved ejection fraction (HCC)   Microcytic anemia   Fecal occult blood test positive   Colonic mass   Iron deficiency anemia due to chronic blood loss   Diverticulosis of colon without hemorrhage   Adenomatous polyp of transverse colon   Adenomatous polyp of descending colon   Adenomatous polyp of sigmoid colon   Gastritis and gastroduodenitis   Hiatal hernia   Hospital Course: Natasha Dickson is a 77 y.o. female with medical history significant of atrial fibrillation on xarelto, systolic HF (recovered EF), non ischemic cardiomyopathy, hypertension and multiple other medical issues here with progressive generalized weakness.  Found to have new anemia and + FOBT as well as AFib with RVR. She was admitted with cardiology consultations. GI took for EGD/colonoscopy 12/20 which showed a "malignant completely obstructing tumor in the cecum and at the ileocecal valve," which has been found to be invasive adenocarcinoma. There were polyps in the transverse, descending, and sigmoid colon which were also sent to pathology ultimately ruling out for malignancy. CT abd/pelvis showed mesenteric LNs, no liver lesions. Rate control has been complicated by hypotension for which cardiology is consulted, maintaining marginal rate control with amiodarone with soft BP. She has received IV iron and 1u PRBCs 12/20 for progressive anemia and had  heparin held due to this. Due to continued difficulty with venous access, PICC line placed, subsequently developed DVT of right brachial and axillary veins as well as SVT of bilateral cephalic veins for which therapeutic anticoagulation was reinitiated and PICC discontinued, right IJ inserted 12/24. Surgery performed hemicolectomy 12/26.    Path with invasive adenocarcinoma for which oncology has recommended outpatient follow up in 1 month. She completed a course of tamiflu for influenza diagnosed 12/29 and is stable for discharge. Will require SNF rehabilitation prior to returning home.   Assessment and Plan: Orthostatic hypotension: Improving overall.  - Continue monitoring with PT/OT at SNF. Volume status currently stable with improving anemia and stable heart rate/BP.    Invasive moderately differentiated adenocarcinoma of the cecum: Other polyps tested negative for malignancy. CT abd/pelvis staging scan showed subcentimeter mesenteric LNs and 4.5cm x 2.6cm thickening of cecum close to ileocecal junction. No hepatic abnormalities. CEA oddly only 3.6 > 4.1. Now s/p laparoscopic hand-assisted right colectomy by Dr. Thermon Dickson 12/26.  - Has multimodal pain regimen ordered, tapering gabapentin to '100mg'$  tID, can come off in coming days. Has not required any tramadol or oxycodone.  - Soft diet per surgery- stable for d/c per surgery - Wounds closed > routine care. - Surgical pathology showed invasive colonic adenocarcinoma, oncology recommends follow up in 1 month outpatient.   Influenza B infection: Symptoms improved, Dx 12/29. Has completed 5 days of tamiflu. - Antitussives prn   Delirium  Mild cognitive impairment:  - much improved - MRI brain (without acute stroke) - delirium precautions   Acute on chronic combined HFrEF with recovered EF, HTN:  - Diuresis per  cardiology, appreciate assistance. Will continue lasix '20mg'$  po daily - Metoprolol dose decreased, will continue on '25mg'$  daily.   - PTA spironolactone, losartan on hold - Coronary calcifications noted on scans, though deferring ischemic evaluation at this time.   Acute hypoxic respiratory failure: Suspect pulmonary edema worsening due to intraoperative fluids, influenza also contributed. Repeat CXR without focal airspace disease - Provide supplemental O2 to maintain SpO2 >89% with normal WOB. Not currently requiring supplemental oxygen.   Acute right brachial/axillary DVT, acute right cephalic and left cephalic SVT: - Continue anticoagulation with xarelto '15mg'$  BID until 1/17 when we switch to '20mg'$  daily.    Persistent AFib with RVR:  - CHA2DS2-VASc score is 5. Continue xarelto. - Rates improved and BP stabilized. BP has tolerated metoprolol since 12/29. Will continue amiodarone 200 mg BID x2 weeks (until 06/15/2022), then daily on 1/7.  - Follow up with cardiology after DC.   Symptomatic iron deficiency, acute on chronic blood loss anemia: Ferritin is 7, iron 15, 3% sat, TIBC up to 535. Suspect thrombocytosis is reactive to this. s/p 1u PRBC 12/22. - IV iron given, stopped oral iron with dark stools complicating ability to discern GI bleeding. Stools now [name]. Since hgb stable, will restart oral iron supplement at discharge  - Monitor CBC closely. Hgb 8.7g/dl at discharge with no evidence of ongoing bleeding.    Vitamin B12 deficiency: Level is 166. - IM supplemented while admitted, continue daily oral supplement   HLD: CK low.  - Statin   Stage IIIa CKD: Based on available CrCl values, 46m/min at discharge. - Avoid nephrotoxins, monitor BMP.   Obesity: Body mass index is 39.11 kg/m.    Hypokalemia: Continue monitoring.   Central line in place, R IJ 12/24.  Discontinued 1/1 in preparation for discharge.   Consultants: GI, surgery, cardiology, oncology Procedures performed:  05/29/22 COLONOSCOPY WITH PROPOFOLCirigliano, Vito V, DO  12/26/23LAPAROSCOPIC HAND ASSISTED RIGHT COLECTOMYStechschulte, PNickola Major MD   Disposition: Skilled nursing facility Diet recommendation: Soft DISCHARGE MEDICATION: Allergies as of 06/12/2022   No Known Allergies         Medication List       STOP taking these medications     cyanocobalamin 1000 MCG/ML injection Commonly known as: VITAMIN B12 Replaced by: cyanocobalamin 1000 MCG tablet    cyclobenzaprine 10 MG tablet Commonly known as: FLEXERIL    diltiazem 180 MG 24 hr capsule Commonly known as: CARDIZEM CD    losartan 25 MG tablet Commonly known as: COZAAR    spironolactone 25 MG tablet Commonly known as: ALDACTONE    traMADol 50 MG tablet Commonly known as: ULTRAM           TAKE these medications     acetaminophen 325 MG tablet Commonly known as: TYLENOL Take 2 tablets (650 mg total) by mouth every 6 (six) hours as needed for mild pain, moderate pain, fever or headache.    amiodarone 200 MG tablet Commonly known as: PACERONE Take 1 tablet (200 mg total) by mouth 2 (two) times daily for 3 days, THEN 1 tablet (200 mg total) daily for 27 days. Start taking on: June 12, 2022    atorvastatin 10 MG tablet Commonly known as: LIPITOR Take 1 tablet (10 mg total) by mouth daily. Held as of 05/17/22    cyanocobalamin 1000 MCG tablet Commonly known as: VITAMIN B12 Take 1 tablet (1,000 mcg total) by mouth daily. Replaces: cyanocobalamin 1000 MCG/ML injection    feeding supplement Liqd Take 237 mLs by mouth 3 (  three) times daily between meals.    folic acid 1 MG tablet Commonly known as: FOLVITE Take 1 tablet (1 mg total) by mouth daily. Start taking on: June 13, 2022    furosemide 20 MG tablet Commonly known as: LASIX Take 1 tablet (20 mg total) by mouth daily.    gabapentin 100 MG capsule Commonly known as: NEURONTIN Take 1 capsule (100 mg total) by mouth 3 (three) times daily.    guaiFENesin-dextromethorphan 100-10 MG/5ML syrup Commonly known as: ROBITUSSIN DM Take 5 mLs by mouth every 4 (four) hours as needed for cough  (chest congestion).    metoprolol succinate 25 MG 24 hr tablet Commonly known as: TOPROL-XL Take 1 tablet (25 mg total) by mouth daily. Start taking on: June 13, 2022  nitroGLYCERIN 0.4 MG SL tablet Commonly known as: NITROSTAT DISSOLVE ONE TABLET UNDER THE TONGUE EVERY 5 MINUTES AS NEEDED FOR CHEST PAIN.  DO NOT EXCEED A TOTAL OF 3 DOSES IN 15 MINUTES What changed: See the new instructions.    Rivaroxaban 15 MG Tabs tablet Commonly known as: XARELTO '15mg'$  twice daily until Jun 26, 2022 then switch to '20mg'$  once daily thereafter     ===================== Inpatient course discussed with patient.  Admitted with anemia, incidentally found to have colon cancer now status post resection.  Path report discussed with patient.  A-fib pathophysiology discussed with patient.  Inpatient course complicated by flu, DVT, delirium.  Fortunately she improved to the point where she could be discharged.  She clearly feels better in the meantime.  Mentation has improved. Orthostasis resolved.  No abd pain.  No CP.  Not SOB.  Diarrhea recently noted, taking 1/2 tab imodium prn.  At home with her husband now.  Meds, vitals, and allergies reviewed.   ROS: Per HPI unless specifically indicated in ROS section   GEN: nad, alert and oriented HEENT: ncat NECK: supple w/o LA CV: IRR not tachycardic. PULM: ctab, no inc wob ABD: soft, +bs, Well healed abd scar, no redness.   EXT: no edema SKIN: no acute rash  30 minutes were devoted to patient care in this encounter (this includes time spent reviewing the patient's file/history, interviewing and examining the patient, counseling/reviewing plan with patient).

## 2022-06-27 NOTE — Telephone Encounter (Signed)
Called and relayed message to patient about lab results. Patient verbalized understanding. Advised to pick up rx as soon as possible and lab appt made for Monday at 11:00 am.

## 2022-06-27 NOTE — Telephone Encounter (Signed)
Hope with [facility] lab called with Critical lab result.   Pt's Potassium was 2.8  Results given to PCP

## 2022-06-28 DIAGNOSIS — E669 Obesity, unspecified: Secondary | ICD-10-CM

## 2022-06-28 DIAGNOSIS — Z96641 Presence of right artificial hip joint: Secondary | ICD-10-CM

## 2022-06-28 DIAGNOSIS — I82621 Acute embolism and thrombosis of deep veins of right upper extremity: Secondary | ICD-10-CM

## 2022-06-28 DIAGNOSIS — I5043 Acute on chronic combined systolic (congestive) and diastolic (congestive) heart failure: Secondary | ICD-10-CM

## 2022-06-28 DIAGNOSIS — D630 Anemia in neoplastic disease: Secondary | ICD-10-CM | POA: Diagnosis not present

## 2022-06-28 DIAGNOSIS — K2990 Gastroduodenitis, unspecified, without bleeding: Secondary | ICD-10-CM

## 2022-06-28 DIAGNOSIS — D62 Acute posthemorrhagic anemia: Secondary | ICD-10-CM

## 2022-06-28 DIAGNOSIS — C50912 Malignant neoplasm of unspecified site of left female breast: Secondary | ICD-10-CM | POA: Diagnosis not present

## 2022-06-28 DIAGNOSIS — C180 Malignant neoplasm of cecum: Secondary | ICD-10-CM | POA: Diagnosis not present

## 2022-06-28 DIAGNOSIS — Z483 Aftercare following surgery for neoplasm: Secondary | ICD-10-CM | POA: Diagnosis not present

## 2022-06-28 DIAGNOSIS — D631 Anemia in chronic kidney disease: Secondary | ICD-10-CM

## 2022-06-28 DIAGNOSIS — I2510 Atherosclerotic heart disease of native coronary artery without angina pectoris: Secondary | ICD-10-CM

## 2022-06-28 DIAGNOSIS — E538 Deficiency of other specified B group vitamins: Secondary | ICD-10-CM

## 2022-06-28 DIAGNOSIS — K429 Umbilical hernia without obstruction or gangrene: Secondary | ICD-10-CM

## 2022-06-28 DIAGNOSIS — I82613 Acute embolism and thrombosis of superficial veins of upper extremity, bilateral: Secondary | ICD-10-CM

## 2022-06-28 DIAGNOSIS — Z7901 Long term (current) use of anticoagulants: Secondary | ICD-10-CM

## 2022-06-28 DIAGNOSIS — K449 Diaphragmatic hernia without obstruction or gangrene: Secondary | ICD-10-CM

## 2022-06-28 DIAGNOSIS — Z9181 History of falling: Secondary | ICD-10-CM

## 2022-06-28 DIAGNOSIS — N1831 Chronic kidney disease, stage 3a: Secondary | ICD-10-CM

## 2022-06-28 DIAGNOSIS — I82A11 Acute embolism and thrombosis of right axillary vein: Secondary | ICD-10-CM

## 2022-06-28 DIAGNOSIS — E785 Hyperlipidemia, unspecified: Secondary | ICD-10-CM

## 2022-06-28 DIAGNOSIS — I428 Other cardiomyopathies: Secondary | ICD-10-CM

## 2022-06-28 DIAGNOSIS — Z6837 Body mass index (BMI) 37.0-37.9, adult: Secondary | ICD-10-CM

## 2022-06-28 DIAGNOSIS — I4819 Other persistent atrial fibrillation: Secondary | ICD-10-CM

## 2022-06-28 DIAGNOSIS — J9601 Acute respiratory failure with hypoxia: Secondary | ICD-10-CM

## 2022-06-28 DIAGNOSIS — K8020 Calculus of gallbladder without cholecystitis without obstruction: Secondary | ICD-10-CM

## 2022-06-28 DIAGNOSIS — Z9049 Acquired absence of other specified parts of digestive tract: Secondary | ICD-10-CM

## 2022-06-28 DIAGNOSIS — G3184 Mild cognitive impairment, so stated: Secondary | ICD-10-CM

## 2022-06-28 DIAGNOSIS — I951 Orthostatic hypotension: Secondary | ICD-10-CM

## 2022-06-28 DIAGNOSIS — N200 Calculus of kidney: Secondary | ICD-10-CM

## 2022-06-28 DIAGNOSIS — Z9012 Acquired absence of left breast and nipple: Secondary | ICD-10-CM

## 2022-06-28 DIAGNOSIS — I130 Hypertensive heart and chronic kidney disease with heart failure and stage 1 through stage 4 chronic kidney disease, or unspecified chronic kidney disease: Secondary | ICD-10-CM

## 2022-06-29 DIAGNOSIS — D649 Anemia, unspecified: Secondary | ICD-10-CM | POA: Insufficient documentation

## 2022-06-29 NOTE — Assessment & Plan Note (Signed)
See notes on follow-up labs. 

## 2022-06-29 NOTE — Assessment & Plan Note (Signed)
Path report discussed with patient.  She has general surgery follow-up pending.  I will await that consult note.  Her abdominal exam is benign.  Okay for outpatient follow-up.  I greatly appreciate the help of all involved.

## 2022-06-29 NOTE — Assessment & Plan Note (Signed)
Hopefully this will continue to improve in the meantime.  See notes on labs.

## 2022-06-29 NOTE — Assessment & Plan Note (Signed)
Continue amiodarone Lasix metoprolol potassium and Xarelto for now.  Follow-up labs pending.  See notes on labs.

## 2022-06-30 ENCOUNTER — Other Ambulatory Visit: Payer: Self-pay | Admitting: Family Medicine

## 2022-06-30 MED ORDER — VITAMIN B-12 1000 MCG PO TABS: ORAL_TABLET | ORAL | Status: DC

## 2022-07-01 ENCOUNTER — Telehealth: Payer: Self-pay | Admitting: Family Medicine

## 2022-07-01 ENCOUNTER — Other Ambulatory Visit: Payer: Medicare Other

## 2022-07-01 DIAGNOSIS — J9601 Acute respiratory failure with hypoxia: Secondary | ICD-10-CM | POA: Diagnosis not present

## 2022-07-01 DIAGNOSIS — C180 Malignant neoplasm of cecum: Secondary | ICD-10-CM | POA: Diagnosis not present

## 2022-07-01 DIAGNOSIS — Z483 Aftercare following surgery for neoplasm: Secondary | ICD-10-CM | POA: Diagnosis not present

## 2022-07-01 DIAGNOSIS — D630 Anemia in neoplastic disease: Secondary | ICD-10-CM | POA: Diagnosis not present

## 2022-07-01 DIAGNOSIS — C50912 Malignant neoplasm of unspecified site of left female breast: Secondary | ICD-10-CM | POA: Diagnosis not present

## 2022-07-01 DIAGNOSIS — I4819 Other persistent atrial fibrillation: Secondary | ICD-10-CM | POA: Diagnosis not present

## 2022-07-01 NOTE — Telephone Encounter (Signed)
Patient called and stated she is seeing her oncologist on Wednesday on wanted to know can she just do her labs there instead of here on Tuesday. Call back number [phone].

## 2022-07-01 NOTE — Telephone Encounter (Signed)
Called and spoke with patient, she is going to advise oncology at her appointment 07/03/22 she needs BMP drawn as order is already placed in system.

## 2022-07-01 NOTE — Telephone Encounter (Signed)
Prescription Request  07/01/2022  Is this a "Controlled Substance" medicine? No  LOV: 06/27/2022  What is the name of the medication or equipment? metoprolol succinate (TOPROL-XL) 25 MG 24 hr tablet  folic acid (FOLVITE) 1 MG tablet    Have you contacted your pharmacy to request a refill? No   Which pharmacy would you like this sent to?   [facility] ([address] [place] [address] [place]) [place] [address] Phone: [phone] Fax: [phone]    Patient notified that their request is being sent to the clinical staff for review and that they should receive a response within 2 business days.   Please advise at [facility] [phone]

## 2022-07-02 ENCOUNTER — Telehealth (HOSPITAL_COMMUNITY): Payer: Self-pay

## 2022-07-02 ENCOUNTER — Other Ambulatory Visit: Payer: Medicare Other

## 2022-07-03 ENCOUNTER — Inpatient Hospital Stay: Payer: Medicare Other | Admitting: Hematology and Oncology

## 2022-07-03 ENCOUNTER — Inpatient Hospital Stay: Payer: Medicare Other | Attending: Hematology and Oncology | Admitting: Hematology and Oncology

## 2022-07-03 ENCOUNTER — Other Ambulatory Visit: Payer: Self-pay

## 2022-07-03 ENCOUNTER — Other Ambulatory Visit: Payer: Self-pay | Admitting: Family Medicine

## 2022-07-03 VITALS — BP 120/82 | HR 64 | Temp 98.1°F | Resp 13 | Wt 195.8 lb

## 2022-07-03 DIAGNOSIS — Z483 Aftercare following surgery for neoplasm: Secondary | ICD-10-CM | POA: Diagnosis not present

## 2022-07-03 DIAGNOSIS — I5042 Chronic combined systolic (congestive) and diastolic (congestive) heart failure: Secondary | ICD-10-CM | POA: Diagnosis not present

## 2022-07-03 DIAGNOSIS — C50912 Malignant neoplasm of unspecified site of left female breast: Secondary | ICD-10-CM | POA: Diagnosis not present

## 2022-07-03 DIAGNOSIS — N182 Chronic kidney disease, stage 2 (mild): Secondary | ICD-10-CM | POA: Diagnosis not present

## 2022-07-03 DIAGNOSIS — C189 Malignant neoplasm of colon, unspecified: Secondary | ICD-10-CM | POA: Insufficient documentation

## 2022-07-03 DIAGNOSIS — D630 Anemia in neoplastic disease: Secondary | ICD-10-CM | POA: Diagnosis not present

## 2022-07-03 DIAGNOSIS — C180 Malignant neoplasm of cecum: Secondary | ICD-10-CM | POA: Diagnosis not present

## 2022-07-03 DIAGNOSIS — I4819 Other persistent atrial fibrillation: Secondary | ICD-10-CM | POA: Diagnosis not present

## 2022-07-03 DIAGNOSIS — C182 Malignant neoplasm of ascending colon: Secondary | ICD-10-CM

## 2022-07-03 DIAGNOSIS — Z853 Personal history of malignant neoplasm of breast: Secondary | ICD-10-CM | POA: Diagnosis not present

## 2022-07-03 DIAGNOSIS — J9601 Acute respiratory failure with hypoxia: Secondary | ICD-10-CM | POA: Diagnosis not present

## 2022-07-03 DIAGNOSIS — D500 Iron deficiency anemia secondary to blood loss (chronic): Secondary | ICD-10-CM

## 2022-07-03 LAB — RETIC PANEL
Immature Retic Fract: 34.4 % — ABNORMAL HIGH (ref 2.3–15.9)
RBC.: 4.5 MIL/uL (ref 3.87–5.11)
Retic Count, Absolute: 92.7 10*3/uL (ref 19.0–186.0)
Retic Ct Pct: 2.1 % (ref 0.4–3.1)
Reticulocyte Hemoglobin: 22.9 pg — ABNORMAL LOW (ref >27.9)

## 2022-07-03 LAB — CBC WITH DIFFERENTIAL (CANCER CENTER ONLY)
Abs Immature Granulocytes: 0.04 10*3/uL (ref 0.00–0.07)
Basophils Absolute: 0.1 10*3/uL (ref 0.0–0.1)
Basophils Relative: 1 %
Eosinophils Absolute: 0.1 10*3/uL (ref 0.0–0.5)
Eosinophils Relative: 2 %
HCT: 37.1 % (ref 36.0–46.0)
Hemoglobin: 11.2 g/dL — ABNORMAL LOW (ref 12.0–15.0)
Immature Granulocytes: 1 %
Lymphocytes Relative: 23 %
Lymphs Abs: 1.6 10*3/uL (ref 0.7–4.0)
MCH: 24.8 pg — ABNORMAL LOW (ref 26.0–34.0)
MCHC: 30.2 g/dL (ref 30.0–36.0)
MCV: 82.3 fL (ref 80.0–100.0)
Monocytes Absolute: 0.6 10*3/uL (ref 0.1–1.0)
Monocytes Relative: 9 %
Neutro Abs: 4.8 10*3/uL (ref 1.7–7.7)
Neutrophils Relative %: 64 %
Platelet Count: 353 10*3/uL (ref 150–400)
RBC: 4.51 MIL/uL (ref 3.87–5.11)
RDW: 21.9 % — ABNORMAL HIGH (ref 11.5–15.5)
WBC Count: 7.2 10*3/uL (ref 4.0–10.5)
nRBC: 0 % (ref 0.0–0.2)

## 2022-07-03 LAB — CMP (CANCER CENTER ONLY)
ALT: 12 U/L (ref 0–44)
AST: 21 U/L (ref 15–41)
Albumin: 3.8 g/dL (ref 3.5–5.0)
Alkaline Phosphatase: 67 U/L (ref 38–126)
Anion gap: 11 (ref 5–15)
BUN: 11 mg/dL (ref 8–23)
CO2: 26 mmol/L (ref 22–32)
Calcium: 9.4 mg/dL (ref 8.9–10.3)
Chloride: 102 mmol/L (ref 98–111)
Creatinine: 1.34 mg/dL — ABNORMAL HIGH (ref 0.44–1.00)
GFR, Estimated: 41 mL/min — ABNORMAL LOW (ref >60)
Glucose, Bld: 109 mg/dL — ABNORMAL HIGH (ref 70–99)
Potassium: 3.3 mmol/L — ABNORMAL LOW (ref 3.5–5.1)
Sodium: 139 mmol/L (ref 135–145)
Total Bilirubin: 1.6 mg/dL — ABNORMAL HIGH (ref 0.3–1.2)
Total Protein: 7.6 g/dL (ref 6.5–8.1)

## 2022-07-03 LAB — IRON AND IRON BINDING CAPACITY (CC-WL,HP ONLY)
Iron: 25 ug/dL — ABNORMAL LOW (ref 28–170)
Saturation Ratios: 4 % — ABNORMAL LOW (ref 10.4–31.8)
TIBC: 570 ug/dL — ABNORMAL HIGH (ref 250–450)
UIBC: 545 ug/dL

## 2022-07-03 LAB — FERRITIN: Ferritin: 14 ng/mL (ref 11–307)

## 2022-07-03 LAB — CEA (ACCESS): CEA (CHCC): 4.86 ng/mL (ref 0.00–5.00)

## 2022-07-03 MED ORDER — FOLIC ACID 1 MG PO TABS: 1.0000 mg | ORAL_TABLET | Freq: Every day | ORAL | 1 refills | Status: DC

## 2022-07-03 MED ORDER — METOPROLOL SUCCINATE ER 25 MG PO TB24: 25.0000 mg | ORAL_TABLET | Freq: Every day | ORAL | 1 refills | Status: DC

## 2022-07-03 NOTE — Progress Notes (Signed)
[place] [phone]   [phone]  INITIAL CONSULT NOTE  Patient Care Team: Tonia Ghent, MD as PCP - General (Family Medicine) Freada Bergeron, MD as PCP - Cardiology (Cardiology)  Hematological/Oncological History # Stage II (T3N0M0) Adenocarcinoma of the Colon 05/29/2022: colonoscopy performed and showed large cecal mass. Pathology confirmed an invasive moderately differentiated adenocarcinoma  . Same day CT scan showed a  4.5 x 2.6 cm asymmetric wall thickening in cecum close to the ileocecal junction.  06/04/2022: laparoscopic right hemicolectomy. Pathology confirms T3N0 adenocarcinoma of the colon.  07/03/2022: establish care with Dr. Lorenso Courier   CHIEF COMPLAINTS/PURPOSE OF CONSULTATION:  "Adenocarcinoma of the Colon "  HISTORY OF PRESENTING ILLNESS:  Natasha Dickson 77 y.o. female with medical history significant for stage I breast cancer 1999, congestive heart failure, CKD, and atrial fibrillation who presents for evaluation of recently diagnosed stage II colon cancer status post resection.  On review of the previous records Ms. Pilson underwent a colonoscopy on 05/29/2022 which showed a large cecal mass with pathology confirming invasive moderately differentiated adenocarcinoma.  Same-day CT scan of the abdomen showed a 4.5 x 2.6 cm asymmetric wall thickening in the cecum close to the ileocecal junction.  On 06/04/2022 patient underwent a laparoscopic right hemicolectomy with pathology confirming a T3 N0 adenocarcinoma of the colon.  Due to concern for these findings the patient was referred to oncology for further evaluation and management.  On exam today Ms. Cieslak is accompanied by her husband.  She reports that she has been well since her surgery.  She notes that the surgeon took out "a foot of bowel".  She notes the lymph nodes were negative.  She notes that she has been taking potassium pills since that time because she was told she had  critically low levels of potassium.  She notes that her bowel movements have not been great.  She notes that initially they were "spurting out" and recently she has been using [place] ointment and wash claws in order to help with the rectal issues.  She notes that she is moving bowels approximately twice per day at this time.  She notes that if she eats something that upsets her stomach it can be difficult.  She reports that her stools are "faintly solid".  She notes that she is also not eating right and is down about 5 pounds in the arm since her last visit.  The surgical site appears to be healing well at this time.  She is not having any drainage, blood in the stool, or dark stools.  She also notes that the surgical site is clean dry and intact.  On further discussion she reports her medical history is significant for heart disease in her mother and colon cancer in her father.  She has 2 healthy children and the third 1 who passed away from pulmonary hypertension.  She reports she is a never smoker and drinks alcohol very rarely, only on special occasions.  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  She is not having any issues with fatigue, lightheadedness, dizziness, chest pain, shortness of breath.  She is not having abdominal pain.  She denies any fevers, chills, sweats, nausea, vomiting or diarrhea.  A full 10 point ROS was otherwise negative.  MEDICAL HISTORY:  Past Medical History:  Diagnosis Date   Breast cancer ([place]) 1999   Stage I in left breast   Chronic combined systolic and diastolic CHF (congestive heart failure) (  [place])    a. 02/2013 Echo: EF 30-35%, diff HK, biatrial enlargement. b. 2D echo 07/2014 showed mildly dilated LV, EF 45-50%, severely dilated left atrium and moderately dilated right atrium.   CKD (chronic kidney disease), stage II    Closed fracture of unspecified part of neck of femur 2011   Right.  Soreness when standing. (Dr. Durward Fortes)   Nonischemic  cardiomyopathy [facility])    a. 02/2013 [facility] CL: No ischemia, prob attenuation vs scar, EF 40%-->Med Rx.   Persistent atrial fibrillation ([place])    a. 02/2013: s/p TEE and attempted cardioversion x 2-->failed-->rate controlled with bb/digoxin, Xarelto initiated.    SURGICAL HISTORY: Past Surgical History:  Procedure Laterality Date   BIOPSY  05/29/2022   Procedure: BIOPSY;  Surgeon: Lavena Bullion, DO;  Location: [place];  Service: Gastroenterology;;   CARDIOVERSION N/A 02/18/2013   Procedure: CARDIOVERSION;  Surgeon: Jolaine Artist, MD;  Location: [facility] ENDOSCOPY;  Service: Cardiovascular;  Laterality: N/A;   CARDIOVERSION N/A 02/22/2013   Procedure: CARDIOVERSION;  Surgeon: Fay Records, MD;  Location: [place];  Service: Cardiovascular;  Laterality: N/A;   CHOLECYSTECTOMY Right 06/04/2022   Procedure: LAPAROSCOPIC HAND ASSISTED RIGHT COLECTOMY;  Surgeon: Felicie Morn, MD;  Location: [place];  Service: General;  Laterality: Right;   COLONOSCOPY WITH PROPOFOL N/A 05/29/2022   Procedure: COLONOSCOPY WITH PROPOFOL;  Surgeon: Lavena Bullion, DO;  Location: [place];  Service: Gastroenterology;  Laterality: N/A;   ESOPHAGOGASTRODUODENOSCOPY (EGD) WITH PROPOFOL N/A 05/29/2022   Procedure: ESOPHAGOGASTRODUODENOSCOPY (EGD) WITH PROPOFOL;  Surgeon: Lavena Bullion, DO;  Location: [place];  Service: Gastroenterology;  Laterality: N/A;   IR FLUORO GUIDE CV LINE RIGHT  06/02/2022   IR US GUIDE VASC ACCESS RIGHT  06/02/2022   MASTECTOMY  1999   Left breast with saline implant.   PARTIAL HIP ARTHROPLASTY Right    POLYPECTOMY  05/29/2022   Procedure: POLYPECTOMY;  Surgeon: Lavena Bullion, DO;  Location: [place] ENDOSCOPY;  Service: Gastroenterology;;   SUBMUCOSAL TATTOO INJECTION  05/29/2022   Procedure: SUBMUCOSAL TATTOO INJECTION;  Surgeon: Lavena Bullion, DO;  Location: [place];  Service: Gastroenterology;;   TEE WITHOUT CARDIOVERSION N/A 02/18/2013   Procedure:  TRANSESOPHAGEAL ECHOCARDIOGRAM (TEE);  Surgeon: Jolaine Artist, MD;  [facility] ENDOSCOPY;  Service: Cardiovascular;  Laterality: N/A;   tubal ligation     TUBAL LIGATION      SOCIAL HISTORY: Social History   Socioeconomic History   Marital status: Married    Spouse name: Not on file   Number of children: Not on file   Years of education: Not on file   Highest education level: Not on file  Occupational History   Occupation: Scientist, water quality    Employer: [employer]  Tobacco Use   Smoking status: Never   Smokeless tobacco: Never  Vaping Use   Vaping Use: Never used  Substance and Sexual Activity   Alcohol use: Yes    Alcohol/week: 0.0 standard drinks of alcohol    Comment: RARE   Drug use: No   Sexual activity: Never  Other Topics Concern   Not on file  Social History Narrative   High School grad.   Married 40+ years   Scientist, water quality at [place] Strain: Low Risk  (10/24/2021)   Overall Financial Resource Strain (CARDIA)    Difficulty of Paying Living Expenses: Not hard at all  Food Insecurity: No Food Insecurity (10/24/2021)   Hunger Vital  Sign    Worried About Charity fundraiser in the Last Year: Never true    [place] in the Last Year: Never true  Transportation Needs: No Transportation Needs (10/24/2021)   PRAPARE - Hydrologist (Medical): No    Lack of Transportation (Non-Medical): No  Physical Activity: Inactive (10/24/2021)   Exercise Vital Sign    Days of Exercise per Week: 0 days    Minutes of Exercise per Session: 0 min  Stress: No Stress Concern Present (10/24/2021)   [place]    Feeling of Stress : Not at all  Social Connections: Moderately Isolated (10/24/2021)   Social Connection and Isolation Panel [NHANES]    Frequency of Communication with Friends and Family: More than three times a week     Frequency of Social Gatherings with Friends and Family: More than three times a week    Attends Religious Services: Never    Marine scientist or Organizations: No    Attends Archivist Meetings: Never    Marital Status: Married  Human resources officer Violence: Not At Risk (10/24/2021)   Humiliation, Afraid, Rape, and Kick questionnaire    Fear of Current or Ex-Partner: No    Emotionally Abused: No    Physically Abused: No    Sexually Abused: No    FAMILY HISTORY: Family History  Problem Relation Age of Onset   Congenital heart disease Son         transposition of great vessels- corrected with  MI  as an adult   COPD Mother        from long-term smoking   Colon cancer Father    Heart disease Father    Heart attack Father    Hypertension Father    Heart disease Brother    Stroke Neg Hx     ALLERGIES:  has No Known Allergies.  MEDICATIONS:  Current Outpatient Medications  Medication Sig Dispense Refill   acetaminophen (TYLENOL) 325 MG tablet Take 2 tablets (650 mg total) by mouth every 6 (six) hours as needed for mild pain, moderate pain, fever or headache.     amiodarone (PACERONE) 200 MG tablet Take 1 tablet (200 mg total) by mouth daily for 3 days.     atorvastatin (LIPITOR) 10 MG tablet Take 1 tablet (10 mg total) by mouth daily. Held as of 05/17/22 90 tablet 3   cyanocobalamin (VITAMIN B12) 1000 MCG tablet Held as of 06/30/22     feeding supplement (ENSURE ENLIVE / ENSURE PLUS) LIQD Take 237 mLs by mouth 3 (three) times daily between meals.     folic acid (FOLVITE) 1 MG tablet Take 1 tablet (1 mg total) by mouth daily.     furosemide (LASIX) 20 MG tablet Take 1 tablet (20 mg total) by mouth daily. 30 tablet    loperamide (IMODIUM A-D) 2 MG tablet Take 0.5-1 tablets (1-2 mg total) by mouth 3 (three) times daily as needed for diarrhea or loose stools.     metoprolol succinate (TOPROL-XL) 25 MG 24 hr tablet Take 1 tablet (25 mg total) by mouth daily.      nitroGLYCERIN (NITROSTAT) 0.4 MG SL tablet DISSOLVE ONE TABLET UNDER THE TONGUE EVERY 5 MINUTES AS NEEDED FOR CHEST PAIN.  DO NOT EXCEED A TOTAL OF 3 DOSES IN 15 MINUTES (Patient not taking: Reported on 06/27/2022) 75 tablet 0   potassium chloride (KLOR-CON M) 10 MEQ tablet Take  1 tablet (10 mEq total) by mouth 2 (two) times daily. 20 tablet 0   rivaroxaban (XARELTO) 20 MG TABS tablet Take 1 tablet (20 mg total) by mouth daily with supper.     No current facility-administered medications for this visit.    REVIEW OF SYSTEMS:   Constitutional: ( - ) fevers, ( - )  chills , ( - ) night sweats Eyes: ( - ) blurriness of vision, ( - ) double vision, ( - ) watery eyes Ears, nose, mouth, throat, and face: ( - ) mucositis, ( - ) sore throat Respiratory: ( - ) cough, ( - ) dyspnea, ( - ) wheezes Cardiovascular: ( - ) palpitation, ( - ) chest discomfort, ( - ) lower extremity swelling Gastrointestinal:  ( - ) nausea, ( - ) heartburn, ( - ) change in bowel habits Skin: ( - ) abnormal skin rashes Lymphatics: ( - ) new lymphadenopathy, ( - ) easy bruising Neurological: ( - ) numbness, ( - ) tingling, ( - ) new weaknesses Behavioral/Psych: ( - ) mood change, ( - ) new changes  All other systems were reviewed with the patient and are negative.  PHYSICAL EXAMINATION: ECOG PERFORMANCE STATUS: 1 - Symptomatic but completely ambulatory  Vitals:   07/03/22 1251  BP: 120/82  Pulse: 64  Resp: 13  Temp: 98.1 F (36.7 C)  SpO2: 100%   Filed Weights   07/03/22 1251  Weight: 195 lb 12.8 oz (88.8 kg)    GENERAL: well appearing elderly Caucasian female in NAD  SKIN: skin color, texture, turgor are normal, no rashes or significant lesions EYES: conjunctiva are pink and non-injected, sclera clear LUNGS: clear to auscultation and percussion with normal breathing effort HEART: regular rate & rhythm and no murmurs and no lower extremity edema ABDOMEN: Well-healing laparoscopic surgical sites.  Clean dry and  intact.  Soft, non-tender, non-distended, normal bowel sounds Musculoskeletal: no cyanosis of digits and no clubbing  PSYCH: alert & oriented x 3, fluent speech NEURO: no focal motor/sensory deficits  LABORATORY DATA:  I have reviewed the data as listed    Latest Ref Rng & Units 07/03/2022    1:33 PM 06/27/2022   11:16 AM 06/12/2022    8:45 AM  CBC  WBC 4.0 - 10.5 K/uL 7.2  5.7    Hemoglobin 12.0 - 15.0 g/dL 11.2  10.9  8.7   Hematocrit 36.0 - 46.0 % 37.1  35.3  28.2   Platelets 150 - 400 K/uL 353  422.0         Latest Ref Rng & Units 07/03/2022    1:33 PM 06/27/2022   11:16 AM 06/09/2022    9:50 AM  CMP  Glucose 70 - 99 mg/dL 109  99  89   BUN 8 - 23 mg/dL '11  11  13   '$ Creatinine 0.44 - 1.00 mg/dL 1.34  1.19  0.98   Sodium 135 - 145 mmol/L 139  139  134   Potassium 3.5 - 5.1 mmol/L 3.3  2.8  3.7   Chloride 98 - 111 mmol/L 102  103  103   CO2 22 - 32 mmol/L '26  26  28   '$ Calcium 8.9 - 10.3 mg/dL 9.4  9.3  7.4   Total Protein 6.5 - 8.1 g/dL 7.6  7.5  5.3   Total Bilirubin 0.3 - 1.2 mg/dL 1.6  1.5  0.5   Alkaline Phos 38 - 126 U/L 67  61  46   AST 15 -  41 U/L '21  26  26   '$ ALT 0 - 44 U/L '12  15  16      '$ PATHOLOGY:  SURGICAL PATHOLOGY CASE: [phone] PATIENT: Emmelina Woelfel Surgical Pathology Report  Clinical History: Obstruction (nt)  FINAL MICROSCOPIC DIAGNOSIS:  A. COLON, RIGHT, HEMICOLECTOMY: Invasive colonic adenocarcinoma, 4.5 cm Carcinoma extends into pericolonic connective tissue (pT3) All margins negative for carcinoma Eighteen lymph nodes negative for metastatic carcinoma (0/18) (pN0) Two separate tubular adenomas Benign appendix See oncology table  ONCOLOGY TABLE: COLON AND RECTUM, CARCINOMA:  Resection Procedure: Hemicolectomy Tumor Site: Cecum at appendiceal orifice Tumor Size: 4.5 x 4 cm Macroscopic Tumor Perforation: Not identified Histologic Type: Colorectal adenocarcinoma, not otherwise specified Histologic Grade: Moderately differentiated,  G2 Multiple Primary Sites: Not applicable Tumor Extension: Into pericolonic connective tissue Lymphovascular Invasion: Not identified Perineural Invasion: Not identified Treatment Effect: No known presurgical therapy Margins:      Margin Status for Invasive Carcinoma: All margins negative for invasive carcinoma      Distance from Invasive Carcinoma to Radial (Circumferential) Margin): 4 cm Regional Lymph Nodes:      Number of Lymph Nodes with Tumor: 0      Number of Lymph Nodes Examined: 18 Tumor Deposits: Not identified Distant Metastasis:      Distant Site(s) Involved: Not applicable Pathologic Stage Classification (pTNM, AJCC 8th Edition): pT3, pN0 Ancillary Studies: Can be performed if requested Representative Tumor Block: A4-A7 (v4.2.0.1)  GROSS DESCRIPTION: Specimen: Right colon including cecum with attached appendix and portion of terminal ileum, received fresh. Specimen integrity: Intact and unopened. Specimen length: There are 22 cm of terminal ileum, and colon is 30 cm from cecum to distal margin. Mesorectal intactness: Not applicable Tumor location: Cecum surrounding appendiceal os, with focal involvement of ileocecal valve. Tumor size: 4.5 x 4 cm tan-red firm well-defined fungating mass which is up to 2.5 cm thick. Percent of bowel circumference involved: The mass involves approximately 80% of the cecum. Tumor distance to margins:                      Proximal: 22 cm                      Distal: 25 cm                      Radial (posterior ascending): 4 cm to the non-f pericolonic soft tissue margin. Macroscopic extent of tumor invasion: The mass focally involves full-thickness of the muscularis, and abuts but does not grossly involve serosa. Total presumed lymph nodes: Found are 19 possible lymph nodes which range from 0.1 to 1.2 cm. Extramural satellite tumor nodules: None Mucosal polyp(s): Within the proximal right colon, 2 cm from the cecal mass, are 2  dark red polyps, 0.6 and 1.2 cm. Additional findings: The appendix is 4.8 cm in length, up to 0.4 cm in diameter, has a smooth pink serosa and unremarkable cut surfaces. Block summary: Block 1 = proximal margin Block 2 = distal margin Blocks 3-5 = grossly deepest invasion of mass, including nearest serosa Block 6 = section including mass and root of appendix Block 7 = mass and adjacent mucosa Block 8 = mass involving ileocecal valve Block 9 = tissue for molecular testing Blocks 10, 11 = polyps, bisected Block 12 = appendix Block 13 = 5 possible nodes Block 14 = 4 possible nodes Block 15 = 5 possible nodes Block 16 = 4 possible nodes Block 17 =  1 node bisected SW 06/05/2022  Final Diagnosis performed by Claudette Laws, MD.   Electronically signed 06/06/2022    RADIOGRAPHIC STUDIES: DG CHEST PORT 1 VIEW  Result Date: 06/11/2022 CLINICAL DATA:  Shortness of breath EXAM: PORTABLE CHEST 1 VIEW COMPARISON:  Radiograph 06/04/2022 FINDINGS: Unchanged enlarged cardiac silhouette. There is no focal airspace consolidation. There is no pleural effusion or evidence of pneumothorax. Bones are unchanged. Thoracic spondylosis. IMPRESSION: No focal airspace disease.  Unchanged cardiomegaly. Electronically Signed   By: Maurine Simmering M.D.   On: 06/11/2022 15:14   MR BRAIN WO CONTRAST  Result Date: 06/08/2022 CLINICAL DATA:  Altered mental status EXAM: MRI HEAD WITHOUT CONTRAST TECHNIQUE: Multiplanar, multiecho pulse sequences of the brain and surrounding structures were obtained without intravenous contrast. COMPARISON:  None Available. FINDINGS: Brain: No acute infarction, hemorrhage, hydrocephalus, extra-axial collection or mass lesion. No chronic microhemorrhage. There is multifocal hyperintense T2-weighted signal within the periventricular and deep white matter. Dilated perivascular space at the left lentiform nucleus. Mild generalized volume loss. The midline structures are normal. Vascular: Normal flow  voids. Skull and upper cervical spine: Normal bone marrow signal. Sinuses/Orbits: Bilateral mastoid fluid. Nasopharynx is clear. Paranasal sinuses are normal. Normal orbits. Other: None IMPRESSION: 1. No acute intracranial abnormality. 2. Findings of chronic microvascular ischemia and generalized volume loss. 3. Bilateral mastoid fluid. Electronically Signed   By: Ulyses Jarred M.D.   On: 06/08/2022 00:44   DG CHEST PORT 1 VIEW  Result Date: 06/04/2022 CLINICAL DATA:  Cough EXAM: PORTABLE CHEST 1 VIEW COMPARISON:  05/27/2022 FINDINGS: Overall inspiratory effort is poor. Heart is mildly enlarged but stable. Aortic calcifications are again seen. Right jugular central line is noted with the tip in the mid right atrium. No pneumothorax is seen. No focal infiltrate or effusion is noted. IMPRESSION: Poor inspiratory effort when compare with the prior exam although no focal infiltrate is seen. No pneumothorax following central line placement is noted. Electronically Signed   By: Inez Catalina M.D.   On: 06/04/2022 19:02    ASSESSMENT & PLAN AZUREE MINISH 77 y.o. female with medical history significant for stage I breast cancer 1999, congestive heart failure, CKD, and atrial fibrillation who presents for evaluation of recently diagnosed stage II colon cancer status post resection.  After review of the labs, review of the records, and discussion with the patient the patients findings are most consistent with stage II low risk adenocarcinoma of the colon.  # Stage II (T3N0M0) Adenocarcinoma of the Colon -- Pathology results status post resection are consistent with a stage II adenocarcinoma the colon.  Findings appear low risk at this time.  No indication for adjuvant chemotherapy. --Plan for CT scans every 12 months for the next 5 years.  Plan for return to clinic every 6 months time for repeat labs and evaluation. --Labs today show white blood cell count 7.2, hemoglobin 11.2, MCV 82.3, and platelets of 353.   Iron saturation at 4% with a total iron-binding capacity of 570.  Ferritin levels are pending. --RTC for 6 months for labs and clinic visit   Orders Placed This Encounter  Procedures   CBC with Differential ([facility] Only)    Standing Status:   Future    Number of Occurrences:   1    Standing Expiration Date:   07/04/2023   CMP ([facility] only)    Standing Status:   Future    Number of Occurrences:   1    Standing Expiration Date:  07/04/2023   Ferritin    Standing Status:   Future    Number of Occurrences:   1    Standing Expiration Date:   07/04/2023   Iron and Iron Binding Capacity ([facility] only)    Standing Status:   Future    Number of Occurrences:   1    Standing Expiration Date:   07/04/2023   Retic Panel    Standing Status:   Future    Number of Occurrences:   1    Standing Expiration Date:   07/04/2023   [facility] ([facility] ONLY    Standing Status:   Future    Number of Occurrences:   1    Standing Expiration Date:   07/04/2023    All questions were answered. The patient knows to call the clinic with any problems, questions or concerns.  A total of more than 60 minutes were spent on this encounter with face-to-face time and non-face-to-face time, including preparing to see the patient, ordering tests and/or medications, counseling the patient and coordination of care as outlined above.   Ledell Peoples, MD Department of Hematology/Oncology [place] at [facility] Phone: [phone] Pager: [phone] Email: [email or web address]  07/03/2022 4:05 PM

## 2022-07-03 NOTE — Addendum Note (Signed)
Addended by: Tonia Ghent on: 07/03/2022 09:25 PM   Modules accepted: Orders

## 2022-07-03 NOTE — Telephone Encounter (Signed)
Sent both prescriptions.  I did not realize this phone note was in process when I started the other phone note.  Please update patient.  Thanks.

## 2022-07-03 NOTE — Progress Notes (Unsigned)
Please see where patient wants folic acid and metoprolol sent.  I can sign order if needed.  Thanks.

## 2022-07-03 NOTE — Telephone Encounter (Signed)
Natasha Dickson from [facility] called in to follow up on this request. Informed her that Dr. Damita Dunnings will return tomorrow. She has enough to get through tomorrow morning but will be out after that.

## 2022-07-04 ENCOUNTER — Other Ambulatory Visit: Payer: Self-pay | Admitting: *Deleted

## 2022-07-04 ENCOUNTER — Telehealth: Payer: Self-pay | Admitting: Hematology and Oncology

## 2022-07-04 ENCOUNTER — Telehealth: Payer: Self-pay | Admitting: *Deleted

## 2022-07-04 DIAGNOSIS — E876 Hypokalemia: Secondary | ICD-10-CM

## 2022-07-04 MED ORDER — FERROUS SULFATE 324 (65 FE) MG PO TBEC: DELAYED_RELEASE_TABLET | ORAL | 2 refills | Status: DC

## 2022-07-04 MED ORDER — POTASSIUM CHLORIDE CRYS ER 10 MEQ PO TBCR: 10.0000 meq | EXTENDED_RELEASE_TABLET | Freq: Two times a day (BID) | ORAL | 1 refills | Status: DC

## 2022-07-04 NOTE — Telephone Encounter (Signed)
-----  Message from Arna Snipe, RN sent at 07/03/2022  5:23 PM EST ----- Regarding: Results Please call patient with information. I was unable to reach patient by phone. I did ask her to call the clinic. Thank you.   ----- Message ----- From: Orson Slick, MD Sent: 07/03/2022   4:06 PM EST To: Arna Snipe, RN  Please let Ms. Holzworth know that her potassium levels are improving.  They are currently up out of the critical range of 2.8 and are now 3.3.  Recommend that she continue taking her potassium pills twice daily.  Additionally she does have iron deficiency.  Please call in ferrous sulfate 325 mg p.o. daily to her pharmacy of her choosing.  Recommend that she take this every morning with orange juice.  If it causes stomach upset/constipation please let us know and we could consider administration of IV iron therapy.  Will plan to have the patient's labs checked again in approximately 3 months time with a clinic visit in 6 months. ----- Message ----- From: Buel Ream, [facility] Sent: 07/03/2022   1:55 PM EST To: Orson Slick, MD

## 2022-07-04 NOTE — Telephone Encounter (Signed)
Notified of message below

## 2022-07-04 NOTE — Progress Notes (Signed)
See other phone note

## 2022-07-04 NOTE — Telephone Encounter (Signed)
Called patient to schedule f/u per 1/24 los notes. Patient scheduled and notified.

## 2022-07-05 ENCOUNTER — Telehealth: Payer: Self-pay | Admitting: Hematology and Oncology

## 2022-07-05 ENCOUNTER — Telehealth: Payer: Self-pay | Admitting: Cardiology

## 2022-07-05 NOTE — Telephone Encounter (Signed)
Pt is calling to confirm her current dose of Toprol XL she is to be taking since discharged from the hospital and since seeing her PCP.   Pt aware that Dr. Damita Dunnings PCP sent in for her to take Toprol XL 25 mg po daily.  Informed her that it appears this was sent in on 1/24 by him to her Pharmacy [facility].    Advised her to proceed with taking this medication as her PCP ordered on her and sent in.  Pt saw her PCP on 06/27/22, post hospital follow-up.  Pt verbalized understanding and agrees with this plan.  She said she just wanted to confirm the dose that he sent in.  Pt will have her Husband go and pick this up.

## 2022-07-05 NOTE — Progress Notes (Signed)
Opened in error

## 2022-07-05 NOTE — Telephone Encounter (Signed)
Pt c/o medication issue:  1. Name of Medication: metoprolol succinate (TOPROL-XL) 25 MG 24 hr tablet   2. How are you currently taking this medication (dosage and times per day)? 1 tablet daily  3. Are you having a reaction (difficulty breathing--STAT)? no  4. What is your medication issue? Patient states she is not sure if she still needs to be on the medication. She says she was on the 100 mg, then they changed it to 25 mg after she was in the hospital. She says her husband is picking up the refills now, but she is not sure if she still needs to take it.

## 2022-07-05 NOTE — Telephone Encounter (Signed)
Patient called to cancel appointments. Patient said she will call back once she knows what dates she can do labs and f/u.

## 2022-07-08 DIAGNOSIS — C180 Malignant neoplasm of cecum: Secondary | ICD-10-CM | POA: Diagnosis not present

## 2022-07-08 DIAGNOSIS — D630 Anemia in neoplastic disease: Secondary | ICD-10-CM | POA: Diagnosis not present

## 2022-07-08 DIAGNOSIS — Z483 Aftercare following surgery for neoplasm: Secondary | ICD-10-CM | POA: Diagnosis not present

## 2022-07-08 DIAGNOSIS — I4819 Other persistent atrial fibrillation: Secondary | ICD-10-CM | POA: Diagnosis not present

## 2022-07-08 DIAGNOSIS — C50912 Malignant neoplasm of unspecified site of left female breast: Secondary | ICD-10-CM | POA: Diagnosis not present

## 2022-07-08 DIAGNOSIS — J9601 Acute respiratory failure with hypoxia: Secondary | ICD-10-CM | POA: Diagnosis not present

## 2022-07-09 ENCOUNTER — Ambulatory Visit: Payer: Self-pay

## 2022-07-09 DIAGNOSIS — C180 Malignant neoplasm of cecum: Secondary | ICD-10-CM | POA: Diagnosis not present

## 2022-07-09 DIAGNOSIS — C50912 Malignant neoplasm of unspecified site of left female breast: Secondary | ICD-10-CM | POA: Diagnosis not present

## 2022-07-09 DIAGNOSIS — I4819 Other persistent atrial fibrillation: Secondary | ICD-10-CM | POA: Diagnosis not present

## 2022-07-09 DIAGNOSIS — D630 Anemia in neoplastic disease: Secondary | ICD-10-CM | POA: Diagnosis not present

## 2022-07-09 DIAGNOSIS — J9601 Acute respiratory failure with hypoxia: Secondary | ICD-10-CM | POA: Diagnosis not present

## 2022-07-09 DIAGNOSIS — Z483 Aftercare following surgery for neoplasm: Secondary | ICD-10-CM | POA: Diagnosis not present

## 2022-07-09 NOTE — Patient Outreach (Signed)
  Care Coordination   Initial Visit Note   07/09/2022 Name: Natasha Dickson MRN: [id number] DOB: 1945/09/10  Natasha Dickson is a 77 y.o. year old female who sees Tonia Ghent, MD for primary care. I  spouse, Natasha Dickson  What matters to the patients health and wellness today?  Maintaining health     Goals Addressed             This Visit's Progress    Patient Stated: Post hospital follow up and management       Care Coordination Interventions: Evaluation of current treatment plan related to colon cancer and patient's adherence to plan as established by provider:  Husband states patient has done well after her colon surgery.  He states patient had follow up with oncologist on 07/03/22 and cancer appears to be at [place].  Husband states patient is recommended to follow up with oncology in 6 months and have CT scans 1 x per year for 5 years. Husband states patient is receiving home health services with [facility].  He states patient continues to have nursing services but he has elected to discontinue PT due to patients lack of motivation to work on physical therapy recommendations.  Reviewed medications with husband/ caregiver and discussed importance of compliance Reviewed scheduled/upcoming provider appointments  Discussed plans with spouse/ caregiver for ongoing care management follow up  Advised husband to encourage patient to increase activities as tolerated.  Advised to notify doctor for any new/ ongoing symptoms.        SDOH assessments and interventions completed:  Yes  SDOH Interventions Today    Flowsheet Row Most Recent Value  SDOH Interventions   Food Insecurity Interventions Intervention Not Indicated  Housing Interventions Intervention Not Indicated  Transportation Interventions Intervention Not Indicated        Care Coordination Interventions:  Yes, provided   Follow up plan: Follow up call scheduled for 07/30/22    Encounter Outcome:  Pt. Visit Completed   Quinn Plowman RN,BSN,CCM [place] [phone] direct line

## 2022-07-11 DIAGNOSIS — C180 Malignant neoplasm of cecum: Secondary | ICD-10-CM | POA: Diagnosis not present

## 2022-07-11 DIAGNOSIS — Z483 Aftercare following surgery for neoplasm: Secondary | ICD-10-CM | POA: Diagnosis not present

## 2022-07-11 DIAGNOSIS — I4819 Other persistent atrial fibrillation: Secondary | ICD-10-CM | POA: Diagnosis not present

## 2022-07-11 DIAGNOSIS — J9601 Acute respiratory failure with hypoxia: Secondary | ICD-10-CM | POA: Diagnosis not present

## 2022-07-11 DIAGNOSIS — D630 Anemia in neoplastic disease: Secondary | ICD-10-CM | POA: Diagnosis not present

## 2022-07-11 DIAGNOSIS — C50912 Malignant neoplasm of unspecified site of left female breast: Secondary | ICD-10-CM | POA: Diagnosis not present

## 2022-07-14 ENCOUNTER — Other Ambulatory Visit: Payer: Self-pay | Admitting: Family Medicine

## 2022-07-14 DIAGNOSIS — D649 Anemia, unspecified: Secondary | ICD-10-CM

## 2022-07-15 ENCOUNTER — Telehealth: Payer: Self-pay

## 2022-07-15 NOTE — Telephone Encounter (Signed)
Spoke to pt, pt stated she'd give [place] a call back to scheduled lab appt. Call back # [phone]

## 2022-07-15 NOTE — Telephone Encounter (Signed)
Patient needs non fasting lab appt in mid feb. Please call patient to schedule.

## 2022-07-15 NOTE — Telephone Encounter (Signed)
Patient called back and scheduled labs for 07/24/22

## 2022-07-16 DIAGNOSIS — D630 Anemia in neoplastic disease: Secondary | ICD-10-CM | POA: Diagnosis not present

## 2022-07-16 DIAGNOSIS — C180 Malignant neoplasm of cecum: Secondary | ICD-10-CM | POA: Diagnosis not present

## 2022-07-16 DIAGNOSIS — J9601 Acute respiratory failure with hypoxia: Secondary | ICD-10-CM | POA: Diagnosis not present

## 2022-07-16 DIAGNOSIS — Z483 Aftercare following surgery for neoplasm: Secondary | ICD-10-CM | POA: Diagnosis not present

## 2022-07-16 DIAGNOSIS — I4819 Other persistent atrial fibrillation: Secondary | ICD-10-CM | POA: Diagnosis not present

## 2022-07-16 DIAGNOSIS — C50912 Malignant neoplasm of unspecified site of left female breast: Secondary | ICD-10-CM | POA: Diagnosis not present

## 2022-07-20 DIAGNOSIS — K449 Diaphragmatic hernia without obstruction or gangrene: Secondary | ICD-10-CM | POA: Diagnosis not present

## 2022-07-20 DIAGNOSIS — D630 Anemia in neoplastic disease: Secondary | ICD-10-CM | POA: Diagnosis not present

## 2022-07-20 DIAGNOSIS — K429 Umbilical hernia without obstruction or gangrene: Secondary | ICD-10-CM | POA: Diagnosis not present

## 2022-07-20 DIAGNOSIS — E538 Deficiency of other specified B group vitamins: Secondary | ICD-10-CM | POA: Diagnosis not present

## 2022-07-20 DIAGNOSIS — I2510 Atherosclerotic heart disease of native coronary artery without angina pectoris: Secondary | ICD-10-CM | POA: Diagnosis not present

## 2022-07-20 DIAGNOSIS — I951 Orthostatic hypotension: Secondary | ICD-10-CM | POA: Diagnosis not present

## 2022-07-20 DIAGNOSIS — K8020 Calculus of gallbladder without cholecystitis without obstruction: Secondary | ICD-10-CM | POA: Diagnosis not present

## 2022-07-20 DIAGNOSIS — I82613 Acute embolism and thrombosis of superficial veins of upper extremity, bilateral: Secondary | ICD-10-CM | POA: Diagnosis not present

## 2022-07-20 DIAGNOSIS — Z483 Aftercare following surgery for neoplasm: Secondary | ICD-10-CM | POA: Diagnosis not present

## 2022-07-20 DIAGNOSIS — I82621 Acute embolism and thrombosis of deep veins of right upper extremity: Secondary | ICD-10-CM | POA: Diagnosis not present

## 2022-07-20 DIAGNOSIS — I5043 Acute on chronic combined systolic (congestive) and diastolic (congestive) heart failure: Secondary | ICD-10-CM | POA: Diagnosis not present

## 2022-07-20 DIAGNOSIS — N1831 Chronic kidney disease, stage 3a: Secondary | ICD-10-CM | POA: Diagnosis not present

## 2022-07-20 DIAGNOSIS — D631 Anemia in chronic kidney disease: Secondary | ICD-10-CM | POA: Diagnosis not present

## 2022-07-20 DIAGNOSIS — K2990 Gastroduodenitis, unspecified, without bleeding: Secondary | ICD-10-CM | POA: Diagnosis not present

## 2022-07-20 DIAGNOSIS — I4819 Other persistent atrial fibrillation: Secondary | ICD-10-CM | POA: Diagnosis not present

## 2022-07-20 DIAGNOSIS — I428 Other cardiomyopathies: Secondary | ICD-10-CM | POA: Diagnosis not present

## 2022-07-20 DIAGNOSIS — I130 Hypertensive heart and chronic kidney disease with heart failure and stage 1 through stage 4 chronic kidney disease, or unspecified chronic kidney disease: Secondary | ICD-10-CM | POA: Diagnosis not present

## 2022-07-20 DIAGNOSIS — D62 Acute posthemorrhagic anemia: Secondary | ICD-10-CM | POA: Diagnosis not present

## 2022-07-20 DIAGNOSIS — G3184 Mild cognitive impairment, so stated: Secondary | ICD-10-CM | POA: Diagnosis not present

## 2022-07-20 DIAGNOSIS — I82A11 Acute embolism and thrombosis of right axillary vein: Secondary | ICD-10-CM | POA: Diagnosis not present

## 2022-07-20 DIAGNOSIS — C180 Malignant neoplasm of cecum: Secondary | ICD-10-CM | POA: Diagnosis not present

## 2022-07-20 DIAGNOSIS — J9601 Acute respiratory failure with hypoxia: Secondary | ICD-10-CM | POA: Diagnosis not present

## 2022-07-20 DIAGNOSIS — C50912 Malignant neoplasm of unspecified site of left female breast: Secondary | ICD-10-CM | POA: Diagnosis not present

## 2022-07-20 DIAGNOSIS — N200 Calculus of kidney: Secondary | ICD-10-CM | POA: Diagnosis not present

## 2022-07-20 DIAGNOSIS — E785 Hyperlipidemia, unspecified: Secondary | ICD-10-CM | POA: Diagnosis not present

## 2022-07-24 ENCOUNTER — Other Ambulatory Visit: Payer: Self-pay | Admitting: Family Medicine

## 2022-07-24 ENCOUNTER — Other Ambulatory Visit (INDEPENDENT_AMBULATORY_CARE_PROVIDER_SITE_OTHER): Payer: Medicare Other

## 2022-07-24 DIAGNOSIS — D649 Anemia, unspecified: Secondary | ICD-10-CM

## 2022-07-24 DIAGNOSIS — Z483 Aftercare following surgery for neoplasm: Secondary | ICD-10-CM | POA: Diagnosis not present

## 2022-07-24 DIAGNOSIS — C180 Malignant neoplasm of cecum: Secondary | ICD-10-CM | POA: Diagnosis not present

## 2022-07-24 DIAGNOSIS — D630 Anemia in neoplastic disease: Secondary | ICD-10-CM | POA: Diagnosis not present

## 2022-07-24 DIAGNOSIS — J9601 Acute respiratory failure with hypoxia: Secondary | ICD-10-CM | POA: Diagnosis not present

## 2022-07-24 DIAGNOSIS — I4819 Other persistent atrial fibrillation: Secondary | ICD-10-CM | POA: Diagnosis not present

## 2022-07-24 DIAGNOSIS — C50912 Malignant neoplasm of unspecified site of left female breast: Secondary | ICD-10-CM | POA: Diagnosis not present

## 2022-07-24 DIAGNOSIS — E538 Deficiency of other specified B group vitamins: Secondary | ICD-10-CM

## 2022-07-24 LAB — CBC WITH DIFFERENTIAL/PLATELET
Basophils Absolute: 0 10*3/uL (ref 0.0–0.1)
Basophils Relative: 0.5 % (ref 0.0–3.0)
Eosinophils Absolute: 0.2 10*3/uL (ref 0.0–0.7)
Eosinophils Relative: 2.7 % (ref 0.0–5.0)
HCT: 41 % (ref 36.0–46.0)
Hemoglobin: 12.6 g/dL (ref 12.0–15.0)
Lymphocytes Relative: 25.7 % (ref 12.0–46.0)
Lymphs Abs: 1.5 10*3/uL (ref 0.7–4.0)
MCHC: 30.7 g/dL (ref 30.0–36.0)
MCV: 87.6 fl (ref 78.0–100.0)
Monocytes Absolute: 0.4 10*3/uL (ref 0.1–1.0)
Monocytes Relative: 7.3 % (ref 3.0–12.0)
Neutro Abs: 3.7 10*3/uL (ref 1.4–7.7)
Neutrophils Relative %: 63.8 % (ref 43.0–77.0)
Platelets: 325 10*3/uL (ref 150.0–400.0)
RBC: 4.67 Mil/uL (ref 3.87–5.11)
RDW: 27.8 % — ABNORMAL HIGH (ref 11.5–15.5)
WBC: 5.8 10*3/uL (ref 4.0–10.5)

## 2022-07-24 LAB — VITAMIN B12: Vitamin B-12: 524 pg/mL (ref 211–911)

## 2022-07-30 ENCOUNTER — Ambulatory Visit: Payer: Self-pay

## 2022-07-30 ENCOUNTER — Other Ambulatory Visit: Payer: Self-pay | Admitting: *Deleted

## 2022-07-30 DIAGNOSIS — D630 Anemia in neoplastic disease: Secondary | ICD-10-CM | POA: Diagnosis not present

## 2022-07-30 DIAGNOSIS — J9601 Acute respiratory failure with hypoxia: Secondary | ICD-10-CM | POA: Diagnosis not present

## 2022-07-30 DIAGNOSIS — C50912 Malignant neoplasm of unspecified site of left female breast: Secondary | ICD-10-CM | POA: Diagnosis not present

## 2022-07-30 DIAGNOSIS — C180 Malignant neoplasm of cecum: Secondary | ICD-10-CM | POA: Diagnosis not present

## 2022-07-30 DIAGNOSIS — I4819 Other persistent atrial fibrillation: Secondary | ICD-10-CM | POA: Diagnosis not present

## 2022-07-30 DIAGNOSIS — Z483 Aftercare following surgery for neoplasm: Secondary | ICD-10-CM | POA: Diagnosis not present

## 2022-07-30 MED ORDER — FERROUS SULFATE 324 (65 FE) MG PO TBEC: DELAYED_RELEASE_TABLET | ORAL | 2 refills | Status: DC

## 2022-07-30 NOTE — Patient Outreach (Signed)
  Care Coordination   07/30/2022 Name: Natasha Dickson MRN: [id number] DOB: Sep 10, 1945   Care Coordination Outreach Attempts:  An unsuccessful telephone outreach was attempted today to offer the patient information about available care coordination services as a benefit of their health plan.  Call attempt to patients spouse.  Unable to reach. HIPAA compliant message left with call back phone number.   Follow Up Plan:  Additional outreach attempts will be made to offer the patient care coordination information and services.   Encounter Outcome:  No Answer   Care Coordination Interventions:  No, not indicated    Quinn Plowman [facility] [place] [phone] direct line

## 2022-08-05 ENCOUNTER — Other Ambulatory Visit: Payer: Self-pay | Admitting: *Deleted

## 2022-08-05 DIAGNOSIS — E876 Hypokalemia: Secondary | ICD-10-CM

## 2022-08-05 MED ORDER — POTASSIUM CHLORIDE CRYS ER 10 MEQ PO TBCR: 10.0000 meq | EXTENDED_RELEASE_TABLET | Freq: Two times a day (BID) | ORAL | 1 refills | Status: DC

## 2022-08-06 ENCOUNTER — Telehealth: Payer: Self-pay | Admitting: *Deleted

## 2022-08-06 DIAGNOSIS — C50912 Malignant neoplasm of unspecified site of left female breast: Secondary | ICD-10-CM | POA: Diagnosis not present

## 2022-08-06 DIAGNOSIS — I4819 Other persistent atrial fibrillation: Secondary | ICD-10-CM | POA: Diagnosis not present

## 2022-08-06 DIAGNOSIS — D500 Iron deficiency anemia secondary to blood loss (chronic): Secondary | ICD-10-CM | POA: Diagnosis not present

## 2022-08-06 DIAGNOSIS — D630 Anemia in neoplastic disease: Secondary | ICD-10-CM | POA: Diagnosis not present

## 2022-08-06 DIAGNOSIS — E785 Hyperlipidemia, unspecified: Secondary | ICD-10-CM | POA: Diagnosis not present

## 2022-08-06 DIAGNOSIS — Z483 Aftercare following surgery for neoplasm: Secondary | ICD-10-CM | POA: Diagnosis not present

## 2022-08-06 DIAGNOSIS — C180 Malignant neoplasm of cecum: Secondary | ICD-10-CM | POA: Diagnosis not present

## 2022-08-06 DIAGNOSIS — J9601 Acute respiratory failure with hypoxia: Secondary | ICD-10-CM | POA: Diagnosis not present

## 2022-08-06 NOTE — Progress Notes (Signed)
  Care Coordination Note  08/06/2022 Name: Natasha Dickson MRN: [id number] DOB: 28-Sep-1945  TALAR OJEDA is a 77 y.o. year old female who is a primary care patient of Tonia Ghent, MD and is actively engaged with the care management team. I reached out to [name] by phone today to assist with re-scheduling a follow up visit with the RN Case Manager  Follow up plan: Unsuccessful telephone outreach attempt made. A HIPAA compliant phone message was left for the patient providing contact information and requesting a return call.   Julian Hy, [place] Direct Dial: [phone]

## 2022-08-13 DIAGNOSIS — J9601 Acute respiratory failure with hypoxia: Secondary | ICD-10-CM | POA: Diagnosis not present

## 2022-08-13 DIAGNOSIS — Z483 Aftercare following surgery for neoplasm: Secondary | ICD-10-CM | POA: Diagnosis not present

## 2022-08-13 DIAGNOSIS — I4819 Other persistent atrial fibrillation: Secondary | ICD-10-CM | POA: Diagnosis not present

## 2022-08-13 DIAGNOSIS — D630 Anemia in neoplastic disease: Secondary | ICD-10-CM | POA: Diagnosis not present

## 2022-08-13 DIAGNOSIS — C180 Malignant neoplasm of cecum: Secondary | ICD-10-CM | POA: Diagnosis not present

## 2022-08-13 DIAGNOSIS — C50912 Malignant neoplasm of unspecified site of left female breast: Secondary | ICD-10-CM | POA: Diagnosis not present

## 2022-08-15 ENCOUNTER — Ambulatory Visit: Payer: Medicare Other | Admitting: Family Medicine

## 2022-08-16 ENCOUNTER — Encounter: Payer: Self-pay | Admitting: Family Medicine

## 2022-08-19 ENCOUNTER — Telehealth: Payer: Self-pay | Admitting: *Deleted

## 2022-08-19 ENCOUNTER — Encounter: Payer: Self-pay | Admitting: Family Medicine

## 2022-08-19 ENCOUNTER — Ambulatory Visit (INDEPENDENT_AMBULATORY_CARE_PROVIDER_SITE_OTHER): Payer: Medicare Other | Admitting: Family Medicine

## 2022-08-19 ENCOUNTER — Ambulatory Visit (INDEPENDENT_AMBULATORY_CARE_PROVIDER_SITE_OTHER)
Admission: RE | Admit: 2022-08-19 | Discharge: 2022-08-19 | Disposition: A | Discharge status: Home / Self Care | Payer: Medicare Other | Source: Ambulatory Visit | Attending: Family Medicine | Admitting: Family Medicine

## 2022-08-19 VITALS — BP 108/72 | HR 97 | Temp 97.2°F | Ht 61.0 in | Wt 189.0 lb

## 2022-08-19 DIAGNOSIS — R413 Other amnesia: Secondary | ICD-10-CM | POA: Diagnosis not present

## 2022-08-19 DIAGNOSIS — E538 Deficiency of other specified B group vitamins: Secondary | ICD-10-CM

## 2022-08-19 DIAGNOSIS — R059 Cough, unspecified: Secondary | ICD-10-CM | POA: Diagnosis not present

## 2022-08-19 DIAGNOSIS — D649 Anemia, unspecified: Secondary | ICD-10-CM

## 2022-08-19 LAB — CBC WITH DIFFERENTIAL/PLATELET
Basophils Absolute: 0.1 10*3/uL (ref 0.0–0.1)
Basophils Relative: 0.8 % (ref 0.0–3.0)
Eosinophils Absolute: 0.4 10*3/uL (ref 0.0–0.7)
Eosinophils Relative: 4 % (ref 0.0–5.0)
HCT: 42.9 % (ref 36.0–46.0)
Hemoglobin: 13.6 g/dL (ref 12.0–15.0)
Lymphocytes Relative: 18.5 % (ref 12.0–46.0)
Lymphs Abs: 2 10*3/uL (ref 0.7–4.0)
MCHC: 31.8 g/dL (ref 30.0–36.0)
MCV: 88.1 fl (ref 78.0–100.0)
Monocytes Absolute: 0.9 10*3/uL (ref 0.1–1.0)
Monocytes Relative: 8.2 % (ref 3.0–12.0)
Neutro Abs: 7.4 10*3/uL (ref 1.4–7.7)
Neutrophils Relative %: 68.5 % (ref 43.0–77.0)
Platelets: 496 10*3/uL — ABNORMAL HIGH (ref 150.0–400.0)
RBC: 4.87 Mil/uL (ref 3.87–5.11)
RDW: 22.6 % — ABNORMAL HIGH (ref 11.5–15.5)
WBC: 10.9 10*3/uL — ABNORMAL HIGH (ref 4.0–10.5)

## 2022-08-19 LAB — BASIC METABOLIC PANEL
BUN: 11 mg/dL (ref 6–23)
CO2: 23 mEq/L (ref 19–32)
Calcium: 9.2 mg/dL (ref 8.4–10.5)
Chloride: 100 mEq/L (ref 96–112)
Creatinine, Ser: 1.11 mg/dL (ref 0.40–1.20)
GFR: 48.31 mL/min — ABNORMAL LOW (ref >60.00)
Glucose, Bld: 98 mg/dL (ref 70–99)
Potassium: 3.2 mEq/L — ABNORMAL LOW (ref 3.5–5.1)
Sodium: 137 mEq/L (ref 135–145)

## 2022-08-19 LAB — FERRITIN: Ferritin: 97.7 ng/mL (ref 10.0–291.0)

## 2022-08-19 LAB — VITAMIN B12: Vitamin B-12: 855 pg/mL (ref 211–911)

## 2022-08-19 MED ORDER — BENZONATATE 200 MG PO CAPS: 200.0000 mg | ORAL_CAPSULE | Freq: Three times a day (TID) | ORAL | 1 refills | Status: DC | PRN

## 2022-08-19 MED ORDER — HYDROCODONE BIT-HOMATROP MBR 5-1.5 MG/5ML PO SOLN: 2.5000 mL | Freq: Three times a day (TID) | ORAL | 0 refills | Status: DC | PRN

## 2022-08-19 NOTE — Progress Notes (Signed)
  Care Coordination Note  08/19/2022 Name: Natasha Dickson MRN: [id number] DOB: November 10, 1945  Natasha Dickson is a 77 y.o. year old female who is a primary care patient of Tonia Ghent, MD and is actively engaged with the care management team. I reached out to [name] by phone today to assist with scheduling an initial visit with the Licensed Clinical Social Worker  Follow up plan: Unsuccessful telephone outreach attempt made. A HIPAA compliant phone message was left for the patient providing contact information and requesting a return call.   Referral received   Julian Hy, [place] Direct Dial: [phone]

## 2022-08-19 NOTE — Patient Instructions (Addendum)
Go to the lab on the way out.   If you have mychart we'll likely use that to update you.    Take care.  Glad to see you.  Don't drive in the meantime.  Social work should call you.   About your memory situation- we can consider neurology evaluation vs starting meds through the clinic here.  I would like to check your labs first.     Try tessalon for the cough and let me know if that isn't helping.   If tessalon doesn't help at all, then try hycodan but it can make the confusion worse- sedation caution.

## 2022-08-19 NOTE — Progress Notes (Unsigned)
Cough for about 1 week, husband was sick.  He improved in the meantime.  She has been on doxy in the meantime.   Some sputum.  No fevers.    H/o anemia, recheck labs pending. Still on xarelto and iron.    Memory d/w pt.  "When I don't have a headache my memory is fine."  She got lost coming to the [facility] last week.  She volunteered that information.  She thought her appointment today was on yesterday.  Here with husband.  Her son passed away a few years ago, she asked when he had passed away- she didn't recall the details of him passing in 2017.  She had more confusion after flu/illness.    Prev MRI d/w pt.  IMPRESSION: 1. No acute intracranial abnormality. 2. Findings of chronic microvascular ischemia and generalized volume loss. 3. Bilateral mastoid fluid.  Offered SW, pt accepted.    Meds, vitals, and allergies reviewed.   ROS: Per HPI unless specifically indicated in ROS section   IRR Ctab No wheeze. Dry cough noted.

## 2022-08-19 NOTE — Progress Notes (Signed)
  Care Coordination Note  08/19/2022 Name: Natasha Dickson MRN: [id number] DOB: July 21, 1945  Natasha Dickson is a 77 y.o. year old female who is a primary care patient of Tonia Ghent, MD and is actively engaged with the care management team. I reached out to [name] by phone today to assist with re-scheduling a follow up visit with the RN Case Manager  Follow up plan: 2nd Unsuccessful telephone outreach attempt made. A HIPAA compliant phone message was left for the patient providing contact information and requesting a return call.   Julian Hy, [place] Direct Dial: [phone]

## 2022-08-21 ENCOUNTER — Other Ambulatory Visit: Payer: Self-pay | Admitting: Family Medicine

## 2022-08-21 ENCOUNTER — Telehealth: Payer: Self-pay | Admitting: Family Medicine

## 2022-08-21 MED ORDER — DONEPEZIL HCL 5 MG PO TABS: 5.0000 mg | ORAL_TABLET | Freq: Every day | ORAL | 1 refills | Status: DC

## 2022-08-21 NOTE — Progress Notes (Signed)
  Care Coordination  Outreach Note  08/21/2022 Name: DANEA MANTER MRN: [id number] DOB: 1945/06/17   Care Coordination Outreach Attempts: A second unsuccessful outreach was attempted today to offer the patient with information about available care coordination services as a benefit of their health plan.     Follow Up Plan:  Additional outreach attempts will be made to offer the patient care coordination information and services.   Encounter Outcome:  No Answer  Julian Hy, [place] Direct Dial: [phone]

## 2022-08-21 NOTE — Assessment & Plan Note (Signed)
I suspect she has a benign viral illness.  Check chest x-ray. Try tessalon for the cough and let me know if that isn't helping.   If tessalon doesn't help at all, then try hycodan but it can make the confusion worse- sedation caution.  Routine medication cautions given.  Okay for outpatient follow-up.

## 2022-08-21 NOTE — Telephone Encounter (Signed)
Called and spoke with patients husband and advised that rx was given to him when they left. He is going to look for it when he gets home and take it to get filled.

## 2022-08-21 NOTE — Telephone Encounter (Signed)
Pt's husband called stating the pharmacy stated they didn't receive the meds, HYDROcodone bit-homatropine (HYCODAN) 5-1.5 MG/5ML syrup [id number]. Pt's husband stated the other meds, benzonatate (TESSALON) 200 MG capsule [id number] was received by pharmacy. Pt's husband is requesting a call back once prescription is sent in again. Call back # [phone]

## 2022-08-21 NOTE — Telephone Encounter (Signed)
Did tessalon help? I printed the rx for HYCODAN at the [facility], signed the blue copy and gave it to him with AVS.  He can fill that if the tessalon didn't help.  Update me as needed.   Thanks.

## 2022-08-21 NOTE — Assessment & Plan Note (Signed)
History of anemia.  Recheck labs pending.  See notes on labs.

## 2022-08-21 NOTE — Assessment & Plan Note (Signed)
See above.  Progressive memory loss.  Discussed with patient and husband. Advised patient not to drive in the meantime.  Social work referral placed. See notes on labs. We can consider neurology evaluation vs starting meds through the clinic here.  I would like to check her labs first.

## 2022-08-22 MED ORDER — HYDROCODONE BIT-HOMATROP MBR 5-1.5 MG/5ML PO SOLN: 2.5000 mL | Freq: Three times a day (TID) | ORAL | 0 refills | Status: DC | PRN

## 2022-08-22 NOTE — Telephone Encounter (Signed)
Sent. Thanks.   

## 2022-08-22 NOTE — Addendum Note (Signed)
Addended by: Tonia Ghent on: 08/22/2022 01:44 PM   Modules accepted: Orders

## 2022-08-22 NOTE — Telephone Encounter (Signed)
Left message notifying patients husband prescription was sent in.

## 2022-08-22 NOTE — Telephone Encounter (Signed)
[facility] called and stated they can not fill the hydrocodone syrup from a written prescription; has to be E prescribed. They also do not have any in stock and can take days if it does come in due to rx being on back order nationwide. [facility] called [facility] and they had it in [facility] and asked if it can be sent there instead. I asked her if the patient was aware of this and she stated that they were not. I advised [facility] pharmacist that I would call the patients husband and discuss this.   Called Vladimir Crofts and advised him what [facility] pharmacy told me about the prescription. He requested rx be sent in to [place].

## 2022-08-23 NOTE — Progress Notes (Signed)
  Care Coordination  Outreach Note  08/23/2022 Name: Natasha Dickson MRN: [id number] DOB: 1945-12-25   Care Coordination Outreach Attempts: A third unsuccessful outreach was attempted today to offer the patient with information about available care coordination services as a benefit of their health plan.   Follow Up Plan:  No further outreach attempts will be made at this time. We have been unable to contact the patient to offer or enroll patient in care coordination services  Encounter Outcome:  No Answer  Julian Hy, [place] Direct Dial: [phone]

## 2022-09-05 ENCOUNTER — Other Ambulatory Visit: Payer: Self-pay | Admitting: Pharmacist

## 2022-09-05 DIAGNOSIS — I4891 Unspecified atrial fibrillation: Secondary | ICD-10-CM

## 2022-09-05 MED ORDER — RIVAROXABAN 20 MG PO TABS: 20.0000 mg | ORAL_TABLET | Freq: Every day | ORAL | 1 refills | Status: DC

## 2022-09-05 NOTE — Telephone Encounter (Signed)
Prescription refill request for Xarelto received.  Indication: a fib, VTE Last office visit: 04/02/22 Weight: 189 Age: 77 Scr: 1.11 CrCl: 58 mL/min

## 2022-09-20 ENCOUNTER — Telehealth: Payer: Self-pay | Admitting: Cardiology

## 2022-09-20 MED ORDER — METOPROLOL SUCCINATE ER 25 MG PO TB24: 25.0000 mg | ORAL_TABLET | Freq: Every day | ORAL | 1 refills | Status: DC

## 2022-09-20 NOTE — Telephone Encounter (Signed)
Pt c/o medication issue:  1. Name of Medication:  metoprolol succinate (TOPROL-XL) 25 MG 24 hr tablet  2. How are you currently taking this medication (dosage and times per day)?   3. Are you having a reaction (difficulty breathing--STAT)?   4. What is your medication issue?  Patient would like to know if she needs to continue on this dose or if she can go back on the 100 MG

## 2022-09-20 NOTE — Telephone Encounter (Signed)
Pt was calling in to get a refill of her Toprol XL 25 mg po daily.  Her PCP last refilled this and she will soon run out.  Confirmed the pharmacy of choice with the pt.   Sent rx script for Toprol XL 25 mg po daily to the pts pharmacy of choice.  Also scheduled her recall visit (6 month follow-up appt) with Dr. Shari Prows at first available for Tuesday 5/28 at 0830.  Pt aware to arrive 15 mins prior to this appt.   Pt verbalized understanding and agrees with this plan.

## 2022-09-23 ENCOUNTER — Ambulatory Visit: Payer: Medicare Other | Admitting: Family Medicine

## 2022-09-26 ENCOUNTER — Telehealth: Payer: Self-pay | Admitting: Cardiology

## 2022-09-26 MED ORDER — METOPROLOL SUCCINATE ER 25 MG PO TB24: 25.0000 mg | ORAL_TABLET | Freq: Every day | ORAL | 1 refills | Status: DC

## 2022-09-26 NOTE — Telephone Encounter (Signed)
Pt's medication was sent to pt's pharmacy as requested. Confirmation received.  °

## 2022-09-26 NOTE — Telephone Encounter (Signed)
*  STAT* If patient is at the pharmacy, call can be transferred to refill team.   1. Which medications need to be refilled? (please list name of each medication and dose if known) metoprolol succinate (TOPROL-XL) 25 MG 24 hr tablet   2. Which pharmacy/location (including street and city if local pharmacy) is medication to be sent to?   [id number] - [place] ([place]), [place] - [address]    3. Do they need a 30 day or 90 day supply? 90    Pt states "I need this sent over again because the pharmacy put it back on the shelf"

## 2022-10-01 ENCOUNTER — Telehealth: Payer: Self-pay | Admitting: Family Medicine

## 2022-10-01 NOTE — Telephone Encounter (Signed)
Contacted Natasha Dickson to schedule their annual wellness visit. Appointment made for 11/07/2022.Left detailed message informing patient of date and time change for this appt.  [facility] Care Guide [facility] AWV TEAM Direct Dial: [phone]

## 2022-10-02 ENCOUNTER — Other Ambulatory Visit: Payer: Medicare Other

## 2022-10-14 ENCOUNTER — Other Ambulatory Visit: Payer: Self-pay

## 2022-10-14 MED ORDER — ATORVASTATIN CALCIUM 10 MG PO TABS: 10.0000 mg | ORAL_TABLET | Freq: Every day | ORAL | 1 refills | Status: DC

## 2022-10-28 ENCOUNTER — Other Ambulatory Visit: Payer: Self-pay | Admitting: *Deleted

## 2022-10-28 MED ORDER — ATORVASTATIN CALCIUM 10 MG PO TABS: 10.0000 mg | ORAL_TABLET | Freq: Every day | ORAL | 1 refills | Status: DC

## 2022-11-01 NOTE — Progress Notes (Unsigned)
Cardiology Office Note:    Date:  11/05/2022   ID:  Natasha Dickson, DOB Feb 11, 1946, MRN [id number]  PCP:  Joaquim Nam, MD   [facility] Providers Cardiologist:  Meriam Sprague, MD {  Referring MD: Joaquim Nam, MD   No chief complaint on file.   History of Present Illness:    Natasha Dickson is a 76 y.o. female with a hx of combined systolic and diastolic heart failure diagnosed in 2014 thought to be tachycardia induced CM in the setting of Afib with RVR, Afib, HTN, HLD, and CKD who was previously followed by Dr. Delton See who now presents to clinic for follow-up.  Per review of the record, the patient has known history of Afib with RVR with TTE showing LVEF 30-35% with diffuse hypokinesis and her stress test was negative for ischemia.  Cardiomyopathy was believed to be secondary to tachycardia associated with atrial fibrillation. LVEF improved to normal 50-55% in 2015 but then decreased to 45-50% in 2016.  Admitted 05/27/22-06/12/21 after presenting with generalized weakness found to have new anemia and Afib with RVR. Was taken for EGD/colo which showed a malignant completely obstructing tumor in the cecum and ileocecal valve consistent with invasive adenocarcinoma. CT abd/pelvis showed mesenteric LNs, no liver lesions. Ultimately underwent hemicolectomy on 12/26. For her Afib, she was placed on amiodarone as well as metop. She developed a DVT in the right brachial vein as well as a SVT for which the patient was started on xarelto.  Today, the patient is overall doing okay. No chest pain, LE edema, orthopnea, PND, or palpitations. Continues to have chronic dyspnea on exertion. Also has weakness with ambulation but this is getting better and no falls. She is mostly sedentary at baseline. Blood pressure was on the softer side in the setting of weight loss and her metop has since been decreased. Has been tolerating xarelto with no recurrence of blood in her stool or urine.  States  she will call Dr. Derek Mound office to ensure she has her 61month follow-up. States she had an episode of constipation last week that turned into diarrhea after a laxative. Symptoms have since resolved but she and her daughter are concerned she may be dehydrated.   Past Medical History:  Diagnosis Date   Breast cancer (HCC) 1999   Stage I in left breast   Chronic combined systolic and diastolic CHF (congestive heart failure) (HCC)    a. 02/2013 Echo: EF 30-35%, diff HK, biatrial enlargement. b. 2D echo 07/2014 showed mildly dilated LV, EF 45-50%, severely dilated left atrium and moderately dilated right atrium.   CKD (chronic kidney disease), stage II    Closed fracture of unspecified part of neck of femur 2011   Right.  Soreness when standing. (Dr. Cleophas Dunker)   Nonischemic cardiomyopathy [facility])    a. 02/2013 Lexi CL: No ischemia, prob attenuation vs scar, EF 40%-->Med Rx.   Persistent atrial fibrillation (HCC)    a. 02/2013: s/p TEE and attempted cardioversion x 2-->failed-->rate controlled with bb/digoxin, Xarelto initiated.    Past Surgical History:  Procedure Laterality Date   BIOPSY  05/29/2022   Procedure: BIOPSY;  Surgeon: Shellia Cleverly, DO;  Location: [facility] ENDOSCOPY;  Service: Gastroenterology;;   CARDIOVERSION N/A 02/18/2013   Procedure: CARDIOVERSION;  Surgeon: Dolores Patty, MD;  Location: [facility] ENDOSCOPY;  Service: Cardiovascular;  Laterality: N/A;   CARDIOVERSION N/A 02/22/2013   Procedure: CARDIOVERSION;  Surgeon: Pricilla Riffle, MD;  Location: [facility] ENDOSCOPY;  Service: Cardiovascular;  Laterality: N/A;   CHOLECYSTECTOMY Right 06/04/2022   Procedure: LAPAROSCOPIC HAND ASSISTED RIGHT COLECTOMY;  Surgeon: Quentin Ore, MD;  Location: [facility] OR;  Service: General;  Laterality: Right;   COLONOSCOPY WITH PROPOFOL N/A 05/29/2022   Procedure: COLONOSCOPY WITH PROPOFOL;  Surgeon: Shellia Cleverly, DO;  Location: [facility] ENDOSCOPY;  Service: Gastroenterology;  Laterality: N/A;    ESOPHAGOGASTRODUODENOSCOPY (EGD) WITH PROPOFOL N/A 05/29/2022   Procedure: ESOPHAGOGASTRODUODENOSCOPY (EGD) WITH PROPOFOL;  Surgeon: Shellia Cleverly, DO;  Location: [facility] ENDOSCOPY;  Service: Gastroenterology;  Laterality: N/A;   IR FLUORO GUIDE CV LINE RIGHT  06/02/2022   IR US GUIDE VASC ACCESS RIGHT  06/02/2022   MASTECTOMY  1999   Left breast with saline implant.   PARTIAL HIP ARTHROPLASTY Right    POLYPECTOMY  05/29/2022   Procedure: POLYPECTOMY;  Surgeon: Shellia Cleverly, DO;  Location: [facility] ENDOSCOPY;  Service: Gastroenterology;;   SUBMUCOSAL TATTOO INJECTION  05/29/2022   Procedure: SUBMUCOSAL TATTOO INJECTION;  Surgeon: Shellia Cleverly, DO;  Location: [facility] ENDOSCOPY;  Service: Gastroenterology;;   TEE WITHOUT CARDIOVERSION N/A 02/18/2013   Procedure: TRANSESOPHAGEAL ECHOCARDIOGRAM (TEE);  Surgeon: Dolores Patty, MD;  Location: [facility] ENDOSCOPY;  Service: Cardiovascular;  Laterality: N/A;   tubal ligation     TUBAL LIGATION      Current Medications: Current Meds  Medication Sig   acetaminophen (TYLENOL) 325 MG tablet Take 2 tablets (650 mg total) by mouth every 6 (six) hours as needed for mild pain, moderate pain, fever or headache.   donepezil (ARICEPT) 5 MG tablet Take 1 tablet (5 mg total) by mouth at bedtime.   feeding supplement (ENSURE ENLIVE / ENSURE PLUS) LIQD Take 237 mLs by mouth 3 (three) times daily between meals.   ferrous sulfate 324 (65 Fe) MG TBEC Take 1 tablet daily with orange juice   loperamide (IMODIUM A-D) 2 MG tablet Take 0.5-1 tablets (1-2 mg total) by mouth 3 (three) times daily as needed for diarrhea or loose stools.   [DISCONTINUED] atorvastatin (LIPITOR) 10 MG tablet Take 1 tablet (10 mg total) by mouth daily.   [DISCONTINUED] furosemide (LASIX) 20 MG tablet Take 1 tablet (20 mg total) by mouth daily.   [DISCONTINUED] metoprolol succinate (TOPROL-XL) 25 MG 24 hr tablet Take 1 tablet (25 mg total) by mouth daily.   [DISCONTINUED] potassium chloride  (KLOR-CON M) 10 MEQ tablet Take 1 tablet (10 mEq total) by mouth 2 (two) times daily.   [DISCONTINUED] rivaroxaban (XARELTO) 20 MG TABS tablet Take 1 tablet (20 mg total) by mouth daily with supper.     Allergies:   Patient has no known allergies.   Social History   Socioeconomic History   Marital status: Married    Spouse name: Not on file   Number of children: Not on file   Years of education: Not on file   Highest education level: Not on file  Occupational History   Occupation: Conservation officer, nature    Employer: [employer]  Tobacco Use   Smoking status: Never   Smokeless tobacco: Never  Vaping Use   Vaping Use: Never used  Substance and Sexual Activity   Alcohol use: Yes    Alcohol/week: 0.0 standard drinks of alcohol    Comment: RARE   Drug use: No   Sexual activity: Never  Other Topics Concern   Not on file  Social History Narrative   High School grad.   Married 40+ years   Conservation officer, nature at [employer]   Social Determinants of Health  Financial Resource Strain: Low Risk  (10/24/2021)   Overall Financial Resource Strain (CARDIA)    Difficulty of Paying Living Expenses: Not hard at all  Food Insecurity: No Food Insecurity (07/09/2022)   Hunger Vital Sign    Worried About Running Out of Food in the Last Year: Never true    Ran Out of Food in the Last Year: Never true  Transportation Needs: No Transportation Needs (07/09/2022)   PRAPARE - Administrator, Civil Service (Medical): No    Lack of Transportation (Non-Medical): No  Physical Activity: Inactive (10/24/2021)   Exercise Vital Sign    Days of Exercise per Week: 0 days    Minutes of Exercise per Session: 0 min  Stress: No Stress Concern Present (10/24/2021)   [employer] of Occupational Health - Occupational Stress Questionnaire    Feeling of Stress : Not at all  Social Connections: Moderately Isolated (10/24/2021)   Social Connection and Isolation Panel [NHANES]    Frequency of Communication with Friends  and Family: More than three times a week    Frequency of Social Gatherings with Friends and Family: More than three times a week    Attends Religious Services: Never    Database administrator or Organizations: No    Attends Engineer, structural: Never    Marital Status: Married     Family History: The patient's family history includes COPD in her mother; Colon cancer in her father; Congenital heart disease in her son; Heart attack in her father; Heart disease in her brother and father; Hypertension in her father. There is no history of Stroke.  ROS:   As per HPI   EKGs/Labs/Other Studies Reviewed:    The following studies were reviewed today: Cardiac Studies & Procedures     STRESS TESTS  NM MYOCAR MULTI W/SPECT W 02/19/2013  Narrative Identification:  The patient is a 77 year old with history of atrial fibrillation and  shortness of breath.  Test to evaluate rule out ischemia  Stress data:  The patient underwent Lexiscan stress testing per protocol.  Baseline EKG showed atrial fibrillation 95 beats per minute, T-wave inversion inferolaterally.  Baseline blood pressure 108/71.  With infusion of Lexiscan, EKG showed no significant ST changes to suggest ischemia.  Overall electrically negative.  Nuclear data:  The patient was studied in a 2-day rest/stress protocol.  She was injected with 30 mCi technetium 99 labeled sestamibi at rest, 30 mCi technetium 99 labeled sestamibi at stress.  Images were reconstructed in the short, vertical, horizontal axes.  On review of the raw data, soft tissue (diaphragm, bowel, breast) surround the heart.  In the initial stress images there is thinning with decreased counts noted in the mid/distal anterior wall.  There is also mild thinning noted in the inferolateral wall (base).  In the recovery images there was no significant change noted from the stress images.  On gating LVEF was calculated at 40% with normal thickening  noted in the anterior wall, hypokinesis inferiorly.  Impression: Lexiscan Sestamibi.  Electrically negative for ischemia.  Sestamibi scan shows anterior thinning consistent with probable soft tissue attenuation(breast) cannot rule-out subendocardial scar.  Basal inferolateral thinning consistent with possible soft tissue attenuation versus subendocardial scar.  No evidence for significant ischemia.   LVEF on gating calculated at 40% with wall motion changes as noted.  Consider echo  to further evaluate systolic function/wall motion.  Overall low risk scan.   Original Report Authenticated By: Dietrich Pates   ECHOCARDIOGRAM  ECHOCARDIOGRAM COMPLETE 04/16/2022  Narrative ECHOCARDIOGRAM REPORT    Patient Name:   Natasha Dickson Date of Exam: 04/16/2022 Medical Rec #:  [id number]     Height:       61.0 in Accession #:    [id number]    Weight:       213.6 lb Date of Birth:  December 07, 1945     BSA:          1.942 m Patient Age:    76 years      BP:           111/83 mmHg Patient Gender: F             HR:           96 bpm. Exam Location:  [address]  Procedure: 2D Echo, Cardiac Doppler and Color Doppler  Indications:    I50.20 Systolic heart failure  History:        Patient has prior history of Echocardiogram examinations, most recent 08/05/2014. Cardiomyopathy, Breast cancer , CKD, Arrythmias:Tachycardia and Atrial Fibrillation, Signs/Symptoms:Dyspnea; Risk Factors:Hypertension and HLD.  Sonographer:    Clearence Ped RCS Referring Phys: [id number] Lisvet Rasheed E Jenayah Antu  IMPRESSIONS   1. Left ventricular ejection fraction, by estimation, is 55 to 60%. The left ventricle has normal function. The left ventricle has no regional wall motion abnormalities. Left ventricular diastolic parameters are indeterminate. 2. Right ventricular systolic function is normal. The right ventricular size is normal. There is normal pulmonary artery systolic pressure. 3. Left atrial size was mildly dilated. 4. The  mitral valve is normal in structure. Mild mitral valve regurgitation. No evidence of mitral stenosis. 5. Tricuspid valve regurgitation is mild to moderate. 6. The aortic valve is tricuspid. Aortic valve regurgitation is not visualized. Aortic valve sclerosis is present, with no evidence of aortic valve stenosis. 7. The inferior vena cava is normal in size with greater than 50% respiratory variability, suggesting right atrial pressure of 3 mmHg.  FINDINGS Left Ventricle: Left ventricular ejection fraction, by estimation, is 55 to 60%. The left ventricle has normal function. The left ventricle has no regional wall motion abnormalities. Global longitudinal strain performed but not reported based on interpreter judgement due to suboptimal tracking. The left ventricular internal cavity size was normal in size. There is no left ventricular hypertrophy. Left ventricular diastolic parameters are indeterminate.  Right Ventricle: The right ventricular size is normal. No increase in right ventricular wall thickness. Right ventricular systolic function is normal. There is normal pulmonary artery systolic pressure. The tricuspid regurgitant velocity is 2.39 m/s, and with an assumed right atrial pressure of 3 mmHg, the estimated right ventricular systolic pressure is 25.8 mmHg.  Left Atrium: Left atrial size was mildly dilated.  Right Atrium: Right atrial size was normal in size.  Pericardium: There is no evidence of pericardial effusion. Presence of epicardial fat layer.  Mitral Valve: The mitral valve is normal in structure. Mild mitral valve regurgitation. No evidence of mitral valve stenosis.  Tricuspid Valve: The tricuspid valve is normal in structure. Tricuspid valve regurgitation is mild to moderate. No evidence of tricuspid stenosis.  Aortic Valve: The aortic valve is tricuspid. Aortic valve regurgitation is not visualized. Aortic valve sclerosis is present, with no evidence of aortic valve  stenosis.  Pulmonic Valve: The pulmonic valve was not well visualized. Pulmonic valve regurgitation is trivial. No evidence of pulmonic stenosis.  Aorta: The aortic root is normal in size and structure.  Venous: The inferior vena cava is normal in size  with greater than 50% respiratory variability, suggesting right atrial pressure of 3 mmHg.  IAS/Shunts: No atrial level shunt detected by color flow Doppler.   LEFT VENTRICLE PLAX 2D LVIDd:         3.40 cm   Diastology LVIDs:         2.40 cm   LV e' medial:    14.30 cm/s LV PW:         0.90 cm   LV E/e' medial:  6.4 LV IVS:        0.80 cm   LV e' lateral:   13.90 cm/s LVOT diam:     2.00 cm   LV E/e' lateral: 6.6 LV SV:         40 LV SV Index:   21 LVOT Area:     3.14 cm   RIGHT VENTRICLE RV Basal diam:  3.60 cm RV S prime:     9.90 cm/s TAPSE (M-mode): 1.6 cm RVSP:           25.8 mmHg  LEFT ATRIUM             Index        RIGHT ATRIUM           Index LA diam:        4.20 cm 2.16 cm/m   RA Pressure: 3.00 mmHg LA Vol (A2C):   73.3 ml 37.74 ml/m  RA Area:     20.30 cm LA Vol (A4C):   81.8 ml 42.11 ml/m  RA Volume:   51.50 ml  26.51 ml/m LA Biplane Vol: 78.8 ml 40.57 ml/m AORTIC VALVE LVOT Vmax:   58.03 cm/s LVOT Vmean:  41.967 cm/s LVOT VTI:    0.128 m  AORTA Ao Root diam: 3.20 cm Ao Asc diam:  2.80 cm  MITRAL VALVE               TRICUSPID VALVE MV Area (PHT):             TR Peak grad:   22.8 mmHg MV Decel Time:             TR Vmax:        239.00 cm/s MV E velocity: 91.20 cm/s  Estimated RAP:  3.00 mmHg RVSP:           25.8 mmHg  SHUNTS Systemic VTI:  0.13 m Systemic Diam: 2.00 cm  Kardie Tobb DO Electronically signed by Thomasene Ripple DO Signature Date/Time: 04/16/2022/3:21:23 PM    Final              EKG:  No new tracing  Recent Labs: 05/24/2022: Pro B Natriuretic peptide (BNP) 203.0 05/31/2022: TSH 5.363 06/01/2022: B Natriuretic Peptide 313.1 06/09/2022: Magnesium 2.2 07/03/2022: ALT  12 08/19/2022: BUN 11; Creatinine, Ser 1.11; Hemoglobin 13.6; Platelets 496.0; Potassium 3.2; Sodium 137  Recent Lipid Panel    Component Value Date/Time   CHOL 147 05/24/2022 1155   CHOL 128 10/11/2021 1136   TRIG 98.0 05/24/2022 1155   HDL 41.50 05/24/2022 1155   HDL 48 10/11/2021 1136   CHOLHDL 4 05/24/2022 1155   VLDL 19.6 05/24/2022 1155   LDLCALC 86 05/24/2022 1155   LDLCALC 66 10/11/2021 1136   LDLDIRECT 137.1 03/29/2011 1016     Risk Assessment/Calculations:    CHA2DS2-VASc Score = 5  This indicates a 7.2% annual risk of stroke. The patient's score is based upon: CHF History: 1 HTN History: 1 Diabetes History: 0 Stroke History: 0 Vascular Disease  History: 0 Age Score: 2 Gender Score: 1       Physical Exam:    VS:  BP 116/70   Pulse 87   Ht 5\' 2"  (1.575 m)   Wt 179 lb 14.4 oz (81.6 kg)   SpO2 99%   BMI 32.90 kg/m     Wt Readings from Last 3 Encounters:  11/05/22 179 lb 14.4 oz (81.6 kg)  08/19/22 189 lb (85.7 kg)  07/03/22 195 lb 12.8 oz (88.8 kg)     GEN: Comfortable, NAD HEENT: Normal NECK: No JVD; No carotid bruits CARDIAC: Irregularly irregular, no murmurs, rubs or gallops RESPIRATORY:  Clear to auscultation without rales, wheezing or rhonchi  ABDOMEN: Soft, non-tender, non-distended MUSCULOSKELETAL: No edema, warm SKIN: Warm and dry NEUROLOGIC:  Alert and oriented x 3 PSYCHIATRIC:  Normal affect   ASSESSMENT:    1. Chronic combined systolic and diastolic CHF, NYHA class 2 (HCC)   2. Mixed hyperlipidemia   3. Essential hypertension   4. Chronic anticoagulation   5. Stage 2 chronic kidney disease   6. Permanent atrial fibrillation (HCC)   7. Acute deep vein thrombosis (DVT) of axillary vein of right upper extremity (HCC)   8. Malignant neoplasm of colon, unspecified part of colon (HCC)   9. Atrial fibrillation, unspecified type (HCC)   10. Hypokalemia   11. Medication management   12. Iron deficiency anemia due to chronic blood loss      PLAN:    In order of problems listed above:  #Adenocarcinoma of the Colon: -Patient diagnosed with obstructing mass in the colon during hospitalization in 05/2022 with path consistent with adenocarcinoma (stage II) -Now s/p hemicolectomy in 06/2022 -No chemo or XRT but is monitoring with yearly CT scans  -Follow-up with Onc as scheduled  #Chronic Combined Systolic and Diastolic HF: Thought to be secondary to tachy induced CM in the setting of Afib with RVR. LVEF initially 30-35%->55-60%. Currently euvolemic with NYHA class III symptoms.  -Continue metop 25mg  XL daily -Continue lasix 20mg  prn for LE edema -Low Na diet  #RUE DVT: -Occurred in the setting of PICC line -On xarelto as below  #Persistent Afib: CHADs-vasc 5. Pursuing rate control. Overall well controlled at home.  -Continue metop 25mg  XL daily -Continue xarelto 20mg  daily  #HTN: Well controlled at home running 120/80s. -Continue metop 25mg  XL daily  #CKD Stage II: Cr stable on most recent labs.  #HLD: -Continue lipitor 10mg  daily  #HypoK: -Check BMET today as has not been taking supplementation  #Anemia: -Diagnosed in the setting of new colonic mass as detailed above and AC -Check CBC with Diff and ferritin today  Follow-up in 6 months  Medication Adjustments/Labs and Tests Ordered: Current medicines are reviewed at length with the patient today.  Concerns regarding medicines are outlined above.  Orders Placed This Encounter  Procedures   Basic metabolic panel   CBC w/Diff   Ferritin   Meds ordered this encounter  Medications   rivaroxaban (XARELTO) 20 MG TABS tablet    Sig: Take 1 tablet (20 mg total) by mouth daily with supper.    Dispense:  90 tablet    Refill:  1   potassium chloride (KLOR-CON M) 10 MEQ tablet    Sig: Take 1 tablet (10 mEq total) by mouth 2 (two) times daily.    Dispense:  30 tablet    Refill:  4   metoprolol succinate (TOPROL-XL) 25 MG 24 hr tablet    Sig: Take 1  tablet (25  mg total) by mouth daily.    Dispense:  90 tablet    Refill:  2   furosemide (LASIX) 20 MG tablet    Sig: Take 1 tablet (20 mg total) by mouth daily.    Dispense:  90 tablet    Refill:  2   atorvastatin (LIPITOR) 10 MG tablet    Sig: Take 1 tablet (10 mg total) by mouth daily.    Dispense:  90 tablet    Refill:  2    Patient Instructions  Medication Instructions:   Your physician recommends that you continue on your current medications as directed. Please refer to the Current Medication list given to you today.  *If you need a refill on your cardiac medications before your next appointment, please call your pharmacy*   Lab Work:  TODAY--BMET, CBC W DIFF, AND FERRITIN   If you have labs (blood work) drawn today and your tests are completely normal, you will receive your results only by: MyChart Message (if you have MyChart) OR A paper copy in the mail If you have any lab test that is abnormal or we need to change your treatment, we will call you to review the results.    Follow-Up: At [facility], you and your health needs are our priority.  As part of our continuing mission to provide you with exceptional heart care, we have created designated Provider Care Teams.  These Care Teams include your primary Cardiologist (physician) and Advanced Practice Providers (APPs -  Physician Assistants and Nurse Practitioners) who all work together to provide you with the care you need, when you need it.  We recommend signing up for the patient portal called "MyChart".  Sign up information is provided on this After Visit Summary.  MyChart is used to connect with patients for Virtual Visits (Telemedicine).  Patients are able to view lab/test results, encounter notes, upcoming appointments, etc.  Non-urgent messages can be sent to your provider as well.   To learn more about what you can do with MyChart, go to [email or web address].    Your next appointment:   6  month(s)  Provider:   Donato Schultz MD         Signed, Meriam Sprague, MD  11/05/2022 9:18 AM    [facility]

## 2022-11-05 ENCOUNTER — Encounter: Payer: Self-pay | Admitting: Cardiology

## 2022-11-05 ENCOUNTER — Ambulatory Visit: Payer: Medicare Other | Attending: Cardiology | Admitting: Cardiology

## 2022-11-05 VITALS — BP 116/70 | HR 87 | Ht 62.0 in | Wt 179.9 lb

## 2022-11-05 DIAGNOSIS — I5042 Chronic combined systolic (congestive) and diastolic (congestive) heart failure: Secondary | ICD-10-CM | POA: Diagnosis not present

## 2022-11-05 DIAGNOSIS — I4821 Permanent atrial fibrillation: Secondary | ICD-10-CM | POA: Diagnosis not present

## 2022-11-05 DIAGNOSIS — Z79899 Other long term (current) drug therapy: Secondary | ICD-10-CM

## 2022-11-05 DIAGNOSIS — N182 Chronic kidney disease, stage 2 (mild): Secondary | ICD-10-CM

## 2022-11-05 DIAGNOSIS — E876 Hypokalemia: Secondary | ICD-10-CM

## 2022-11-05 DIAGNOSIS — E782 Mixed hyperlipidemia: Secondary | ICD-10-CM

## 2022-11-05 DIAGNOSIS — D500 Iron deficiency anemia secondary to blood loss (chronic): Secondary | ICD-10-CM | POA: Diagnosis not present

## 2022-11-05 DIAGNOSIS — I82A11 Acute embolism and thrombosis of right axillary vein: Secondary | ICD-10-CM | POA: Diagnosis not present

## 2022-11-05 DIAGNOSIS — Z7901 Long term (current) use of anticoagulants: Secondary | ICD-10-CM | POA: Diagnosis not present

## 2022-11-05 DIAGNOSIS — I4891 Unspecified atrial fibrillation: Secondary | ICD-10-CM | POA: Diagnosis not present

## 2022-11-05 DIAGNOSIS — I10 Essential (primary) hypertension: Secondary | ICD-10-CM

## 2022-11-05 DIAGNOSIS — C189 Malignant neoplasm of colon, unspecified: Secondary | ICD-10-CM

## 2022-11-05 MED ORDER — METOPROLOL SUCCINATE ER 25 MG PO TB24: 25.0000 mg | ORAL_TABLET | Freq: Every day | ORAL | 2 refills | Status: DC

## 2022-11-05 MED ORDER — ATORVASTATIN CALCIUM 10 MG PO TABS
10.0000 mg | ORAL_TABLET | Freq: Every day | ORAL | 2 refills | Status: DC
Start: 2022-11-05 — End: 2023-08-28

## 2022-11-05 MED ORDER — POTASSIUM CHLORIDE CRYS ER 10 MEQ PO TBCR: 10.0000 meq | EXTENDED_RELEASE_TABLET | Freq: Two times a day (BID) | ORAL | 4 refills | Status: DC

## 2022-11-05 MED ORDER — RIVAROXABAN 20 MG PO TABS: 20.0000 mg | ORAL_TABLET | Freq: Every day | ORAL | 1 refills | Status: DC

## 2022-11-05 MED ORDER — FUROSEMIDE 20 MG PO TABS
20.0000 mg | ORAL_TABLET | Freq: Every day | ORAL | 2 refills | Status: DC
Start: 2022-11-05 — End: 2023-08-08

## 2022-11-05 NOTE — Patient Instructions (Signed)
Medication Instructions:   Your physician recommends that you continue on your current medications as directed. Please refer to the Current Medication list given to you today.  *If you need a refill on your cardiac medications before your next appointment, please call your pharmacy*   Lab Work:  TODAY--BMET, CBC W DIFF, AND FERRITIN   If you have labs (blood work) drawn today and your tests are completely normal, you will receive your results only by: MyChart Message (if you have MyChart) OR A paper copy in the mail If you have any lab test that is abnormal or we need to change your treatment, we will call you to review the results.    Follow-Up: At [facility], you and your health needs are our priority.  As part of our continuing mission to provide you with exceptional heart care, we have created designated Provider Care Teams.  These Care Teams include your primary Cardiologist (physician) and Advanced Practice Providers (APPs -  Physician Assistants and Nurse Practitioners) who all work together to provide you with the care you need, when you need it.  We recommend signing up for the patient portal called "MyChart".  Sign up information is provided on this After Visit Summary.  MyChart is used to connect with patients for Virtual Visits (Telemedicine).  Patients are able to view lab/test results, encounter notes, upcoming appointments, etc.  Non-urgent messages can be sent to your provider as well.   To learn more about what you can do with MyChart, go to [email or web address].    Your next appointment:   6 month(s)  Provider:   Donato Schultz MD

## 2022-11-06 LAB — BASIC METABOLIC PANEL
BUN/Creatinine Ratio: 10 — ABNORMAL LOW (ref 12–28)
BUN: 13 mg/dL (ref 8–27)
CO2: 22 mmol/L (ref 20–29)
Calcium: 9.3 mg/dL (ref 8.7–10.3)
Chloride: 103 mmol/L (ref 96–106)
Creatinine, Ser: 1.25 mg/dL — ABNORMAL HIGH (ref 0.57–1.00)
Glucose: 102 mg/dL — ABNORMAL HIGH (ref 70–99)
Potassium: 3.7 mmol/L (ref 3.5–5.2)
Sodium: 141 mmol/L (ref 134–144)
eGFR: 45 mL/min/{1.73_m2} — ABNORMAL LOW (ref >59)

## 2022-11-06 LAB — CBC WITH DIFFERENTIAL/PLATELET
Basophils Absolute: 0.1 10*3/uL (ref 0.0–0.2)
Basos: 1 %
EOS (ABSOLUTE): 0.2 10*3/uL (ref 0.0–0.4)
Eos: 3 %
Hematocrit: 43.2 % (ref 34.0–46.6)
Hemoglobin: 13.8 g/dL (ref 11.1–15.9)
Immature Grans (Abs): 0 10*3/uL (ref 0.0–0.1)
Immature Granulocytes: 1 %
Lymphocytes Absolute: 2 10*3/uL (ref 0.7–3.1)
Lymphs: 29 %
MCH: 30.1 pg (ref 26.6–33.0)
MCHC: 31.9 g/dL (ref 31.5–35.7)
MCV: 94 fL (ref 79–97)
Monocytes Absolute: 0.5 10*3/uL (ref 0.1–0.9)
Monocytes: 8 %
Neutrophils Absolute: 4.2 10*3/uL (ref 1.4–7.0)
Neutrophils: 58 %
Platelets: 271 10*3/uL (ref 150–450)
RBC: 4.58 x10E6/uL (ref 3.77–5.28)
RDW: 15.1 % (ref 11.7–15.4)
WBC: 7.2 10*3/uL (ref 3.4–10.8)

## 2022-11-06 LAB — FERRITIN: Ferritin: 37 ng/mL (ref 15–150)

## 2022-11-07 ENCOUNTER — Ambulatory Visit (INDEPENDENT_AMBULATORY_CARE_PROVIDER_SITE_OTHER): Payer: Medicare Other

## 2022-11-07 ENCOUNTER — Telehealth: Payer: Self-pay | Admitting: Cardiology

## 2022-11-07 VITALS — Ht 62.0 in | Wt 198.0 lb

## 2022-11-07 DIAGNOSIS — Z0000 Encounter for general adult medical examination without abnormal findings: Secondary | ICD-10-CM | POA: Diagnosis not present

## 2022-11-07 NOTE — Progress Notes (Signed)
I connected with  Shawnie Pons on 11/07/22 by a audio enabled telemedicine application and verified that I am speaking with the correct person using two identifiers.  Patient Location: Home  Provider Location: Home Office  I discussed the limitations of evaluation and management by telemedicine. The patient expressed understanding and agreed to proceed.  Subjective:   NOGA MERKLINGER is a 77 y.o. female who presents for Medicare Annual (Subsequent) preventive examination.  Review of Systems      Cardiac Risk Factors include: advanced age (>84men, >72 women);sedentary lifestyle     Objective:    Today's Vitals   11/07/22 1442  Weight: 198 lb (89.8 kg)  Height: 5\' 2"  (1.575 m)   Body mass index is 36.21 kg/m.     11/07/2022    2:56 PM 07/03/2022    1:03 PM 10/24/2021    4:01 PM 03/05/2016    3:51 PM 02/16/2013   10:08 AM  Advanced Directives  Does Patient Have a Medical Advance Directive? No No No No Patient does not have advance directive  Would patient like information on creating a medical advance directive? No - Patient declined Yes (MAU/Ambulatory/Procedural Areas - Information given) No - Patient declined No - patient declined information     Current Medications (verified) Outpatient Encounter Medications as of 11/07/2022  Medication Sig   acetaminophen (TYLENOL) 325 MG tablet Take 2 tablets (650 mg total) by mouth every 6 (six) hours as needed for mild pain, moderate pain, fever or headache.   atorvastatin (LIPITOR) 10 MG tablet Take 1 tablet (10 mg total) by mouth daily.   furosemide (LASIX) 20 MG tablet Take 1 tablet (20 mg total) by mouth daily.   metoprolol succinate (TOPROL-XL) 25 MG 24 hr tablet Take 1 tablet (25 mg total) by mouth daily.   potassium chloride (KLOR-CON M) 10 MEQ tablet Take 1 tablet (10 mEq total) by mouth 2 (two) times daily.   rivaroxaban (XARELTO) 20 MG TABS tablet Take 1 tablet (20 mg total) by mouth daily with supper.   benzonatate (TESSALON)  200 MG capsule Take 1 capsule (200 mg total) by mouth 3 (three) times daily as needed. (Patient not taking: Reported on 11/05/2022)   donepezil (ARICEPT) 5 MG tablet Take 1 tablet (5 mg total) by mouth at bedtime. (Patient not taking: Reported on 11/07/2022)   feeding supplement (ENSURE ENLIVE / ENSURE PLUS) LIQD Take 237 mLs by mouth 3 (three) times daily between meals. (Patient not taking: Reported on 11/07/2022)   ferrous sulfate 324 (65 Fe) MG TBEC Take 1 tablet daily with orange juice (Patient not taking: Reported on 11/07/2022)   folic acid (FOLVITE) 1 MG tablet Take 1 tablet (1 mg total) by mouth daily. (Patient not taking: Reported on 11/05/2022)   HYDROcodone bit-homatropine (HYCODAN) 5-1.5 MG/5ML syrup Take 2.5-5 mLs by mouth every 8 (eight) hours as needed for cough (sedation caution). (Patient not taking: Reported on 11/05/2022)   loperamide (IMODIUM A-D) 2 MG tablet Take 0.5-1 tablets (1-2 mg total) by mouth 3 (three) times daily as needed for diarrhea or loose stools. (Patient not taking: Reported on 11/07/2022)   nitroGLYCERIN (NITROSTAT) 0.4 MG SL tablet DISSOLVE ONE TABLET UNDER THE TONGUE EVERY 5 MINUTES AS NEEDED FOR CHEST PAIN.  DO NOT EXCEED A TOTAL OF 3 DOSES IN 15 MINUTES (Patient not taking: Reported on 11/07/2022)   No facility-administered encounter medications on file as of 11/07/2022.    Allergies (verified) Patient has no known allergies.   History: Past Medical  History:  Diagnosis Date   Breast cancer (HCC) 1999   Stage I in left breast   Chronic combined systolic and diastolic CHF (congestive heart failure) (HCC)    a. 02/2013 Echo: EF 30-35%, diff HK, biatrial enlargement. b. 2D echo 07/2014 showed mildly dilated LV, EF 45-50%, severely dilated left atrium and moderately dilated right atrium.   CKD (chronic kidney disease), stage II    Closed fracture of unspecified part of neck of femur 2011   Right.  Soreness when standing. (Dr. Cleophas Dunker)   Nonischemic cardiomyopathy  [facility])    a. 02/2013 Lexi CL: No ischemia, prob attenuation vs scar, EF 40%-->Med Rx.   Persistent atrial fibrillation (HCC)    a. 02/2013: s/p TEE and attempted cardioversion x 2-->failed-->rate controlled with bb/digoxin, Xarelto initiated.   Past Surgical History:  Procedure Laterality Date   BIOPSY  05/29/2022   Procedure: BIOPSY;  Surgeon: Shellia Cleverly, DO;  Location: [facility] ENDOSCOPY;  Service: Gastroenterology;;   CARDIOVERSION N/A 02/18/2013   Procedure: CARDIOVERSION;  Surgeon: Dolores Patty, MD;  Location: [facility] ENDOSCOPY;  Service: Cardiovascular;  Laterality: N/A;   CARDIOVERSION N/A 02/22/2013   Procedure: CARDIOVERSION;  Surgeon: Pricilla Riffle, MD;  Location: [facility] ENDOSCOPY;  Service: Cardiovascular;  Laterality: N/A;   CHOLECYSTECTOMY Right 06/04/2022   Procedure: LAPAROSCOPIC HAND ASSISTED RIGHT COLECTOMY;  Surgeon: Quentin Ore, MD;  Location: [facility] OR;  Service: General;  Laterality: Right;   COLONOSCOPY WITH PROPOFOL N/A 05/29/2022   Procedure: COLONOSCOPY WITH PROPOFOL;  Surgeon: Shellia Cleverly, DO;  Location: [facility] ENDOSCOPY;  Service: Gastroenterology;  Laterality: N/A;   ESOPHAGOGASTRODUODENOSCOPY (EGD) WITH PROPOFOL N/A 05/29/2022   Procedure: ESOPHAGOGASTRODUODENOSCOPY (EGD) WITH PROPOFOL;  Surgeon: Shellia Cleverly, DO;  Location: [facility] ENDOSCOPY;  Service: Gastroenterology;  Laterality: N/A;   IR FLUORO GUIDE CV LINE RIGHT  06/02/2022   IR US GUIDE VASC ACCESS RIGHT  06/02/2022   MASTECTOMY  1999   Left breast with saline implant.   PARTIAL HIP ARTHROPLASTY Right    POLYPECTOMY  05/29/2022   Procedure: POLYPECTOMY;  Surgeon: Shellia Cleverly, DO;  Location: [facility] ENDOSCOPY;  Service: Gastroenterology;;   SUBMUCOSAL TATTOO INJECTION  05/29/2022   Procedure: SUBMUCOSAL TATTOO INJECTION;  Surgeon: Shellia Cleverly, DO;  Location: [facility] ENDOSCOPY;  Service: Gastroenterology;;   TEE WITHOUT CARDIOVERSION N/A 02/18/2013   Procedure: TRANSESOPHAGEAL ECHOCARDIOGRAM  (TEE);  Surgeon: Dolores Patty, MD;  Location: [facility] ENDOSCOPY;  Service: Cardiovascular;  Laterality: N/A;   tubal ligation     TUBAL LIGATION     Family History  Problem Relation Age of Onset   Congenital heart disease Son         transposition of great vessels- corrected with  MI  as an adult   COPD Mother        from long-term smoking   Colon cancer Father    Heart disease Father    Heart attack Father    Hypertension Father    Heart disease Brother    Stroke Neg Hx    Social History   Socioeconomic History   Marital status: Married    Spouse name: Not on file   Number of children: Not on file   Years of education: Not on file   Highest education level: Not on file  Occupational History   Occupation: Lobbyist: [employer]  Tobacco Use   Smoking status: Never   Smokeless tobacco: Never  Vaping Use   Vaping Use: Never used  Substance and Sexual Activity   Alcohol use: Yes    Alcohol/week: 0.0 standard drinks of alcohol    Comment: RARE   Drug use: No   Sexual activity: Never  Other Topics Concern   Not on file  Social History Narrative   High School grad.   Married 40+ years   Conservation officer, nature at [employer]   Social Determinants of Health   Financial Resource Strain: Low Risk  (11/07/2022)   Overall Financial Resource Strain (CARDIA)    Difficulty of Paying Living Expenses: Not hard at all  Food Insecurity: No Food Insecurity (11/07/2022)   Hunger Vital Sign    Worried About Running Out of Food in the Last Year: Never true    Ran Out of Food in the Last Year: Never true  Transportation Needs: No Transportation Needs (07/09/2022)   PRAPARE - Administrator, Civil Service (Medical): No    Lack of Transportation (Non-Medical): No  Physical Activity: Inactive (11/07/2022)   Exercise Vital Sign    Days of Exercise per Week: 0 days    Minutes of Exercise per Session: 0 min  Stress: No Stress Concern Present (11/07/2022)   [employer] of  Occupational Health - Occupational Stress Questionnaire    Feeling of Stress : Not at all  Social Connections: Moderately Isolated (11/07/2022)   Social Connection and Isolation Panel [NHANES]    Frequency of Communication with Friends and Family: More than three times a week    Frequency of Social Gatherings with Friends and Family: More than three times a week    Attends Religious Services: Never    Database administrator or Organizations: No    Attends Engineer, structural: Never    Marital Status: Married    Tobacco Counseling Counseling given: Not Answered   Clinical Intake:  Pre-visit preparation completed: Yes  Pain : No/denies pain     Nutritional Risks: None Diabetes: No  How often do you need to have someone help you when you read instructions, pamphlets, or other written materials from your doctor or pharmacy?: 1 - Never  Diabetic? no  Interpreter Needed?: No  Information entered by :: C.Valaria Kohut LPN   Activities of Daily Living    11/07/2022    2:57 PM  In your present state of health, do you have any difficulty performing the following activities:  Hearing? 0  Vision? 0  Difficulty concentrating or making decisions? 0  Walking or climbing stairs? 0  Dressing or bathing? 0  Doing errands, shopping? 0  Preparing Food and eating ? N  Using the Toilet? N  In the past six months, have you accidently leaked urine? N  Do you have problems with loss of bowel control? N  Managing your Medications? N  Managing your Finances? N  Housekeeping or managing your Housekeeping? N    Patient Care Team: Joaquim Nam, MD as PCP - General (Family Medicine) Meriam Sprague, MD as PCP - Cardiology (Cardiology)  Indicate any recent Medical Services you may have received from other than [facility] providers in the past year (date may be approximate).     Assessment:   This is a routine wellness examination for Maylia.  Hearing/Vision screen Hearing  Screening - Comments:: No hearing issues Vision Screening - Comments:: Glasses - [facility]  Dietary issues and exercise activities discussed: Current Exercise Habits: The patient does not participate in regular exercise at present, Exercise limited by: None identified   Goals Addressed  This Visit's Progress    Patient Stated       No new goals       Depression Screen    11/07/2022    2:56 PM 08/19/2022   11:35 AM 10/24/2021    3:57 PM 01/01/2021    8:33 AM 03/05/2016    3:51 PM  PHQ 2/9 Scores  PHQ - 2 Score 0 0 0 0 0  PHQ- 9 Score  0       Fall Risk    11/07/2022    2:57 PM 08/19/2022   11:35 AM 10/24/2021    4:00 PM 01/01/2021    8:34 AM 03/05/2016    3:51 PM  Fall Risk   Falls in the past year? 0 0 0 1 Yes  Number falls in past yr: 0 0 0 0 1  Injury with Fall? 0 0 0 1 Yes  Risk for fall due to : No Fall Risks No Fall Risks No Fall Risks    Follow up Falls prevention discussed;Falls evaluation completed Falls evaluation completed   Falls evaluation completed    FALL RISK PREVENTION PERTAINING TO THE HOME:  Any stairs in or around the home? Yes  If so, are there any without handrails? No  Home free of loose throw rugs in walkways, pet beds, electrical cords, etc? Yes  Adequate lighting in your home to reduce risk of falls? Yes   ASSISTIVE DEVICES UTILIZED TO PREVENT FALLS:  Life alert? No  Use of a cane, walker or w/c? No  Grab bars in the bathroom? No  Shower chair or bench in shower? Yes  Elevated toilet seat or a handicapped toilet? No      Cognitive Function:    03/05/2016    3:56 PM  MMSE - Mini Mental State Exam  Orientation to time 5  Orientation to Place 5  Registration 3  Attention/ Calculation 0  Recall 3  Language- name 2 objects 0  Language- repeat 1  Language- follow 3 step command 3  Language- read & follow direction 0  Write a sentence 0  Copy design 0  Total score 20        11/07/2022    2:58 PM 10/24/2021     4:01 PM  6CIT Screen  What Year? 0 points 0 points  What month? 0 points 0 points  What time? 0 points 0 points  Count back from 20 0 points 0 points  Months in reverse 0 points 0 points  Repeat phrase 0 points 0 points  Total Score 0 points 0 points    Immunizations Immunization History  Administered Date(s) Administered   Fluad Quad(high Dose 65+) 05/17/2022   Influenza Split 03/26/2011   Influenza, High Dose Seasonal PF 03/09/2015, 05/22/2021   Influenza,inj,Quad PF,6+ Mos 02/23/2013, 03/05/2016   Influenza-Unspecified 03/10/2014, 03/24/2017   Moderna Sars-Covid-2 Vaccination 07/06/2019, 08/03/2019, 07/04/2020   Pneumococcal Conjugate-13 03/05/2016   Pneumococcal Polysaccharide-23 02/02/2019   Unspecified SARS-COV-2 Vaccination 05/22/2021    TDAP status: Due, Education has been provided regarding the importance of this vaccine. Advised may receive this vaccine at local pharmacy or Health Dept. Aware to provide a copy of the vaccination record if obtained from local pharmacy or Health Dept. Verbalized acceptance and understanding.  Flu Vaccine status: Up to date  Pneumococcal vaccine status: Up to date  Covid-19 vaccine status: Information provided on how to obtain vaccines.   Qualifies for Shingles Vaccine? Yes   Zostavax completed No   Shingrix Completed?: No.  Education has been provided regarding the importance of this vaccine. Patient has been advised to call insurance company to determine out of pocket expense if they have not yet received this vaccine. Advised may also receive vaccine at local pharmacy or Health Dept. Verbalized acceptance and understanding.  Screening Tests Health Maintenance  Topic Date Due   DTaP/Tdap/Td (1 - Tdap) Never done   Zoster Vaccines- Shingrix (1 of 2) Never done   Hepatitis C Screening  03/05/2026 (Originally 03/18/1964)   INFLUENZA VACCINE  01/09/2023   Medicare Annual Wellness (AWV)  11/07/2023   Pneumonia Vaccine 2+ Years old   Completed   DEXA SCAN  Completed   HPV VACCINES  Aged Out   Colonoscopy  Discontinued   COVID-19 Vaccine  Discontinued    Health Maintenance  Health Maintenance Due  Topic Date Due   DTaP/Tdap/Td (1 - Tdap) Never done   Zoster Vaccines- Shingrix (1 of 2) Never done    Colorectal cancer screening: Type of screening: Colonoscopy. Completed 05/29/22. Repeat every 1 years  Mammogram status: No longer required due to age. Pt declines mammogram.  Bone Scan - Declined  Lung Cancer Screening: (Low Dose CT Chest recommended if Age 64-80 years, 30 pack-year currently smoking OR have quit w/in 15years.) does not qualify.   Lung Cancer Screening Referral: no  Additional Screening:  Hepatitis C Screening: does qualify; Completed DUE AT NEXT OV  Vision Screening: Recommended annual ophthalmology exams for early detection of glaucoma and other disorders of the eye. Is the patient up to date with their annual eye exam?  Yes  Who is the provider or what is the name of the office in which the patient attends annual eye exams? Dr.Gould If pt is not established with a provider, would they like to be referred to a provider to establish care? Yes .   Dental Screening: Recommended annual dental exams for proper oral hygiene  Community Resource Referral / Chronic Care Management: CRR required this visit?  No   CCM required this visit?  No      Plan:     I have personally reviewed and noted the following in the patient's chart:   Medical and social history Use of alcohol, tobacco or illicit drugs  Current medications and supplements including opioid prescriptions. Patient is not currently taking opioid prescriptions. Functional ability and status Nutritional status Physical activity Advanced directives List of other physicians Hospitalizations, surgeries, and ER visits in previous 12 months Vitals Screenings to include cognitive, depression, and falls Referrals and  appointments  In addition, I have reviewed and discussed with patient certain preventive protocols, quality metrics, and best practice recommendations. A written personalized care plan for preventive services as well as general preventive health recommendations were provided to patient.     Maryan Puls, LPN   1/61/0960   Nurse Notes: no concerns

## 2022-11-07 NOTE — Patient Instructions (Signed)
Natasha Dickson , Thank you for taking time to come for your Medicare Wellness Visit. I appreciate your ongoing commitment to your health goals. Please review the following plan we discussed and let me know if I can assist you in the future.   These are the goals we discussed:  Goals       Patient Stated      No new goals      Patient Stated: Post hospital follow up and management      Care Coordination Interventions: Evaluation of current treatment plan related to colon cancer and patient's adherence to plan as established by provider:  Husband states patient has done well after her colon surgery.  He states patient had follow up with oncologist on 07/03/22 and cancer appears to be at bay.  Husband states patient is recommended to follow up with oncology in 6 months and have CT scans 1 x per year for 5 years. Husband states patient is receiving home health services with [facility].  He states patient continues to have nursing services but he has elected to discontinue PT due to patients lack of motivation to work on physical therapy recommendations.  Reviewed medications with husband/ caregiver and discussed importance of compliance Reviewed scheduled/upcoming provider appointments  Discussed plans with spouse/ caregiver for ongoing care management follow up  Advised husband to encourage patient to increase activities as tolerated.  Advised to notify doctor for any new/ ongoing symptoms.      Patint stated (pt-stated)      No current goals.         This is a list of the screening recommended for you and due dates:  Health Maintenance  Topic Date Due   DTaP/Tdap/Td vaccine (1 - Tdap) Never done   Zoster (Shingles) Vaccine (1 of 2) Never done   Medicare Annual Wellness Visit  10/25/2022   Hepatitis C Screening  03/05/2026*   Flu Shot  01/09/2023   Pneumonia Vaccine  Completed   DEXA scan (bone density measurement)  Completed   HPV Vaccine  Aged Out   Colon Cancer Screening  Discontinued    COVID-19 Vaccine  Discontinued  *Topic was postponed. The date shown is not the original due date.    Advanced directives: none  Conditions/risks identified: Aim for 30 minutes of exercise or brisk walking, 6-8 glasses of water, and 5 servings of fruits and vegetables each day.   Next appointment: Follow up in one year for your annual wellness visit 11/14/23 @ 11:15 televisit   Preventive Care 65 Years and Older, Female Preventive care refers to lifestyle choices and visits with your health care provider that can promote health and wellness. What does preventive care include? A yearly physical exam. This is also called an annual well check. Dental exams once or twice a year. Routine eye exams. Ask your health care provider how often you should have your eyes checked. Personal lifestyle choices, including: Daily care of your teeth and gums. Regular physical activity. Eating a healthy diet. Avoiding tobacco and drug use. Limiting alcohol use. Practicing safe sex. Taking low-dose aspirin every day. Taking vitamin and mineral supplements as recommended by your health care provider. What happens during an annual well check? The services and screenings done by your health care provider during your annual well check will depend on your age, overall health, lifestyle risk factors, and family history of disease. Counseling  Your health care provider may ask you questions about your: Alcohol use. Tobacco use. Drug use. Emotional  well-being. Home and relationship well-being. Sexual activity. Eating habits. History of falls. Memory and ability to understand (cognition). Work and work Astronomer. Reproductive health. Screening  You may have the following tests or measurements: Height, weight, and BMI. Blood pressure. Lipid and cholesterol levels. These may be checked every 5 years, or more frequently if you are over 69 years old. Skin check. Lung cancer screening. You may have this  screening every year starting at age 41 if you have a 30-pack-year history of smoking and currently smoke or have quit within the past 15 years. Fecal occult blood test (FOBT) of the stool. You may have this test every year starting at age 53. Flexible sigmoidoscopy or colonoscopy. You may have a sigmoidoscopy every 5 years or a colonoscopy every 10 years starting at age 28. Hepatitis C blood test. Hepatitis B blood test. Sexually transmitted disease (STD) testing. Diabetes screening. This is done by checking your blood sugar (glucose) after you have not eaten for a while (fasting). You may have this done every 1-3 years. Bone density scan. This is done to screen for osteoporosis. You may have this done starting at age 31. Mammogram. This may be done every 1-2 years. Talk to your health care provider about how often you should have regular mammograms. Talk with your health care provider about your test results, treatment options, and if necessary, the need for more tests. Vaccines  Your health care provider may recommend certain vaccines, such as: Influenza vaccine. This is recommended every year. Tetanus, diphtheria, and acellular pertussis (Tdap, Td) vaccine. You may need a Td booster every 10 years. Zoster vaccine. You may need this after age 41. Pneumococcal 13-valent conjugate (PCV13) vaccine. One dose is recommended after age 33. Pneumococcal polysaccharide (PPSV23) vaccine. One dose is recommended after age 57. Talk to your health care provider about which screenings and vaccines you need and how often you need them. This information is not intended to replace advice given to you by your health care provider. Make sure you discuss any questions you have with your health care provider. Document Released: 06/23/2015 Document Revised: 02/14/2016 Document Reviewed: 03/28/2015 Elsevier Interactive Patient Education  2017 [employer].  Fall Prevention in the Home Falls can cause injuries.  They can happen to people of all ages. There are many things you can do to make your home safe and to help prevent falls. What can I do on the outside of my home? Regularly fix the edges of walkways and driveways and fix any cracks. Remove anything that might make you trip as you walk through a door, such as a raised step or threshold. Trim any bushes or trees on the path to your home. Use bright outdoor lighting. Clear any walking paths of anything that might make someone trip, such as rocks or tools. Regularly check to see if handrails are loose or broken. Make sure that both sides of any steps have handrails. Any raised decks and porches should have guardrails on the edges. Have any leaves, snow, or ice cleared regularly. Use sand or salt on walking paths during winter. Clean up any spills in your garage right away. This includes oil or grease spills. What can I do in the bathroom? Use night lights. Install grab bars by the toilet and in the tub and shower. Do not use towel bars as grab bars. Use non-skid mats or decals in the tub or shower. If you need to sit down in the shower, use a plastic, non-slip stool. Keep  the floor dry. Clean up any water that spills on the floor as soon as it happens. Remove soap buildup in the tub or shower regularly. Attach bath mats securely with double-sided non-slip rug tape. Do not have throw rugs and other things on the floor that can make you trip. What can I do in the bedroom? Use night lights. Make sure that you have a light by your bed that is easy to reach. Do not use any sheets or blankets that are too big for your bed. They should not hang down onto the floor. Have a firm chair that has side arms. You can use this for support while you get dressed. Do not have throw rugs and other things on the floor that can make you trip. What can I do in the kitchen? Clean up any spills right away. Avoid walking on wet floors. Keep items that you use a lot  in easy-to-reach places. If you need to reach something above you, use a strong step stool that has a grab bar. Keep electrical cords out of the way. Do not use floor polish or wax that makes floors slippery. If you must use wax, use non-skid floor wax. Do not have throw rugs and other things on the floor that can make you trip. What can I do with my stairs? Do not leave any items on the stairs. Make sure that there are handrails on both sides of the stairs and use them. Fix handrails that are broken or loose. Make sure that handrails are as long as the stairways. Check any carpeting to make sure that it is firmly attached to the stairs. Fix any carpet that is loose or worn. Avoid having throw rugs at the top or bottom of the stairs. If you do have throw rugs, attach them to the floor with carpet tape. Make sure that you have a light switch at the top of the stairs and the bottom of the stairs. If you do not have them, ask someone to add them for you. What else can I do to help prevent falls? Wear shoes that: Do not have high heels. Have rubber bottoms. Are comfortable and fit you well. Are closed at the toe. Do not wear sandals. If you use a stepladder: Make sure that it is fully opened. Do not climb a closed stepladder. Make sure that both sides of the stepladder are locked into place. Ask someone to hold it for you, if possible. Clearly mark and make sure that you can see: Any grab bars or handrails. First and last steps. Where the edge of each step is. Use tools that help you move around (mobility aids) if they are needed. These include: Canes. Walkers. Scooters. Crutches. Turn on the lights when you go into a dark area. Replace any light bulbs as soon as they burn out. Set up your furniture so you have a clear path. Avoid moving your furniture around. If any of your floors are uneven, fix them. If there are any pets around you, be aware of where they are. Review your medicines  with your doctor. Some medicines can make you feel dizzy. This can increase your chance of falling. Ask your doctor what other things that you can do to help prevent falls. This information is not intended to replace advice given to you by your health care provider. Make sure you discuss any questions you have with your health care provider. Document Released: 03/23/2009 Document Revised: 11/02/2015 Document Reviewed: 07/01/2014 Elsevier  Interactive Patient Education  2017 [employer].

## 2022-11-07 NOTE — Telephone Encounter (Signed)
Daughter did not want me to send a note. She said if she had any questions, she would communicate on my -chart.

## 2022-11-08 ENCOUNTER — Telehealth: Payer: Self-pay | Admitting: *Deleted

## 2022-11-08 ENCOUNTER — Telehealth: Payer: Self-pay | Admitting: Family Medicine

## 2022-11-08 DIAGNOSIS — R413 Other amnesia: Secondary | ICD-10-CM

## 2022-11-08 NOTE — Telephone Encounter (Signed)
   Reason for Referral Request: Evaluation for Dementia   Has patient been seen PCP for this complaint? Patient's daughter says yes, they have been asking for a referral for a while   No,  please schedule patient for appointment for complaint.  Patient scheduled on:   Yes, please find out following information.  Referral for which specialty: Neurology   Preferred office/provider: [place] area, no preferred office

## 2022-11-08 NOTE — Telephone Encounter (Signed)
We checked her labs back in March and then offered treatment for dementia versus neurology referral.  According to the records husband was contacted and they opted for treatment with plan to follow-up in April.  I have not seen the patient since.  I put in the referral for neurology. Did she ever start donepezil in the meantime?

## 2022-11-08 NOTE — Telephone Encounter (Signed)
Received call from pt's daughter. She is calling as she is concerned about pt's 40 lb weight loss since her hemicolectomy surgery in December, for colon cancer. Pt did not require radiation or chemo. Daughter states she is not eating, not cooking, not even bathing. Daughter is concerned that her mother is developing some dementia but she also wants to make sure there is nothing going on related to her colon cancer.  Apparently she had an appt for 01/01/23 but daughter is asking for an earlier appt. Advised that we can certainly see her sooner. Appt made for 11/29/22. Also advised that she get her mother in to see her primary care provider as well as there seems to be other issues that may be driving the weight loss. Daughter voiced understanding. She said that her father does not want to take her mother to appts and daughter states her mother should not be driving, therefore several PCP appts have been missed. Daughter voiced understanding to the above.  Scheduling message has been sent for appt on 11/29/22

## 2022-11-08 NOTE — Telephone Encounter (Signed)
Patient's daughter contacted the office for this request, made appt for patient to discuss this issue

## 2022-11-11 NOTE — Telephone Encounter (Signed)
Patient has f/u appt on 11/22/22 and will discuss at visit

## 2022-11-19 ENCOUNTER — Encounter: Payer: Self-pay | Admitting: Family Medicine

## 2022-11-22 ENCOUNTER — Encounter: Payer: Self-pay | Admitting: Family Medicine

## 2022-11-22 ENCOUNTER — Ambulatory Visit (INDEPENDENT_AMBULATORY_CARE_PROVIDER_SITE_OTHER): Payer: Medicare Other | Admitting: Family Medicine

## 2022-11-22 VITALS — BP 118/70 | HR 99 | Temp 97.8°F | Ht 62.0 in | Wt 183.0 lb

## 2022-11-22 DIAGNOSIS — I5042 Chronic combined systolic (congestive) and diastolic (congestive) heart failure: Secondary | ICD-10-CM

## 2022-11-22 DIAGNOSIS — E876 Hypokalemia: Secondary | ICD-10-CM | POA: Diagnosis not present

## 2022-11-22 DIAGNOSIS — C189 Malignant neoplasm of colon, unspecified: Secondary | ICD-10-CM

## 2022-11-22 DIAGNOSIS — D500 Iron deficiency anemia secondary to blood loss (chronic): Secondary | ICD-10-CM | POA: Diagnosis not present

## 2022-11-22 DIAGNOSIS — N182 Chronic kidney disease, stage 2 (mild): Secondary | ICD-10-CM

## 2022-11-22 DIAGNOSIS — I4821 Permanent atrial fibrillation: Secondary | ICD-10-CM | POA: Diagnosis not present

## 2022-11-22 DIAGNOSIS — I10 Essential (primary) hypertension: Secondary | ICD-10-CM

## 2022-11-22 DIAGNOSIS — R413 Other amnesia: Secondary | ICD-10-CM

## 2022-11-22 DIAGNOSIS — E782 Mixed hyperlipidemia: Secondary | ICD-10-CM

## 2022-11-22 DIAGNOSIS — I4891 Unspecified atrial fibrillation: Secondary | ICD-10-CM | POA: Diagnosis not present

## 2022-11-22 DIAGNOSIS — I82A11 Acute embolism and thrombosis of right axillary vein: Secondary | ICD-10-CM | POA: Diagnosis not present

## 2022-11-22 DIAGNOSIS — Z7901 Long term (current) use of anticoagulants: Secondary | ICD-10-CM

## 2022-11-22 DIAGNOSIS — Z79899 Other long term (current) drug therapy: Secondary | ICD-10-CM | POA: Diagnosis not present

## 2022-11-22 MED ORDER — POTASSIUM CHLORIDE CRYS ER 10 MEQ PO TBCR: 10.0000 meq | EXTENDED_RELEASE_TABLET | Freq: Every day | ORAL | Status: DC

## 2022-11-22 NOTE — Patient Instructions (Signed)
Monitor weight.  Keep going with adequate meals and exercise.   If you lose weight then let me know.  Take care.  Glad to see you.

## 2022-11-22 NOTE — Progress Notes (Unsigned)
She took donepezil for a few nights, didn't feel well, then stopped med.  Had nausea on med.  Improved off med.   She is more sedentary.  Historically she enjoyed working in her flower beds.    D/w pt about memory.   2024 June Friday.   Cat chair bowl Can do math EARTH----->HTRAE 2/3 on recall  She is off iron in the meantime.    Family had noted tremor in the hands.     Meds, vitals, and allergies reviewed.   ROS: Per HPI unless specifically indicated in ROS section   Goal is stay at home.  Declined neuro eval.  Didn't tolerate donepezil.  Has hematology f/u pending.  Discussed home safety.   Monitor weight.  Keep going with adequate meals and exercise.

## 2022-11-24 NOTE — Assessment & Plan Note (Signed)
Discussed her goals. She wants to be able to stay at home.  Patient declined neuro eval. I offered multiple times. Didn't tolerate donepezil.  Has hematology f/u pending.  Discussed home safety.   I asked her to monitor weight.  Goal is to keep going with adequate meals and exercise.   If any more weight loss or neurologic changes then I want her to update me.

## 2022-11-29 ENCOUNTER — Other Ambulatory Visit: Payer: Self-pay

## 2022-11-29 ENCOUNTER — Other Ambulatory Visit: Payer: Self-pay | Admitting: Hematology and Oncology

## 2022-11-29 ENCOUNTER — Inpatient Hospital Stay: Payer: Medicare Other | Attending: Hematology and Oncology

## 2022-11-29 ENCOUNTER — Inpatient Hospital Stay (HOSPITAL_BASED_OUTPATIENT_CLINIC_OR_DEPARTMENT_OTHER): Payer: Medicare Other | Admitting: Hematology and Oncology

## 2022-11-29 VITALS — BP 131/92 | HR 87 | Temp 98.1°F | Resp 16 | Wt 187.4 lb

## 2022-11-29 DIAGNOSIS — C182 Malignant neoplasm of ascending colon: Secondary | ICD-10-CM

## 2022-11-29 DIAGNOSIS — D500 Iron deficiency anemia secondary to blood loss (chronic): Secondary | ICD-10-CM | POA: Diagnosis not present

## 2022-11-29 DIAGNOSIS — C180 Malignant neoplasm of cecum: Secondary | ICD-10-CM | POA: Insufficient documentation

## 2022-11-29 LAB — RETIC PANEL
Immature Retic Fract: 20.7 % — ABNORMAL HIGH (ref 2.3–15.9)
RBC.: 4.45 MIL/uL (ref 3.87–5.11)
Retic Count, Absolute: 98.3 10*3/uL (ref 19.0–186.0)
Retic Ct Pct: 2.2 % (ref 0.4–3.1)
Reticulocyte Hemoglobin: 33.5 pg (ref >27.9)

## 2022-11-29 LAB — CMP (CANCER CENTER ONLY)
ALT: 10 U/L (ref 0–44)
AST: 16 U/L (ref 15–41)
Albumin: 3.8 g/dL (ref 3.5–5.0)
Alkaline Phosphatase: 89 U/L (ref 38–126)
Anion gap: 6 (ref 5–15)
BUN: 11 mg/dL (ref 8–23)
CO2: 26 mmol/L (ref 22–32)
Calcium: 9.8 mg/dL (ref 8.9–10.3)
Chloride: 106 mmol/L (ref 98–111)
Creatinine: 1.14 mg/dL — ABNORMAL HIGH (ref 0.44–1.00)
GFR, Estimated: 50 mL/min — ABNORMAL LOW (ref >60)
Glucose, Bld: 83 mg/dL (ref 70–99)
Potassium: 4.3 mmol/L (ref 3.5–5.1)
Sodium: 138 mmol/L (ref 135–145)
Total Bilirubin: 1.2 mg/dL (ref 0.3–1.2)
Total Protein: 7.2 g/dL (ref 6.5–8.1)

## 2022-11-29 LAB — IRON AND IRON BINDING CAPACITY (CC-WL,HP ONLY)
Iron: 89 ug/dL (ref 28–170)
Saturation Ratios: 17 % (ref 10.4–31.8)
TIBC: 512 ug/dL — ABNORMAL HIGH (ref 250–450)
UIBC: 423 ug/dL (ref 148–442)

## 2022-11-29 LAB — CBC WITH DIFFERENTIAL (CANCER CENTER ONLY)
Abs Immature Granulocytes: 0.04 10*3/uL (ref 0.00–0.07)
Basophils Absolute: 0.1 10*3/uL (ref 0.0–0.1)
Basophils Relative: 1 %
Eosinophils Absolute: 0.2 10*3/uL (ref 0.0–0.5)
Eosinophils Relative: 3 %
HCT: 43.3 % (ref 36.0–46.0)
Hemoglobin: 14.1 g/dL (ref 12.0–15.0)
Immature Granulocytes: 1 %
Lymphocytes Relative: 28 %
Lymphs Abs: 1.8 10*3/uL (ref 0.7–4.0)
MCH: 31.1 pg (ref 26.0–34.0)
MCHC: 32.6 g/dL (ref 30.0–36.0)
MCV: 95.6 fL (ref 80.0–100.0)
Monocytes Absolute: 0.5 10*3/uL (ref 0.1–1.0)
Monocytes Relative: 8 %
Neutro Abs: 3.9 10*3/uL (ref 1.7–7.7)
Neutrophils Relative %: 59 %
Platelet Count: 261 10*3/uL (ref 150–400)
RBC: 4.53 MIL/uL (ref 3.87–5.11)
RDW: 16.8 % — ABNORMAL HIGH (ref 11.5–15.5)
WBC Count: 6.6 10*3/uL (ref 4.0–10.5)
nRBC: 0 % (ref 0.0–0.2)

## 2022-11-29 LAB — FERRITIN: Ferritin: 16 ng/mL (ref 11–307)

## 2022-11-29 NOTE — Progress Notes (Signed)
[facility] Telephone:([phone]) [phone]   [phone]  PROGRESS NOTE  Patient Care Team: Joaquim Nam, MD as PCP - General (Family Medicine) Meriam Sprague, MD as PCP - Cardiology (Cardiology)  Hematological/Oncological History # Stage II (T3N0M0) Adenocarcinoma of the Colon 05/29/2022: colonoscopy performed and showed large cecal mass. Pathology confirmed an invasive moderately differentiated adenocarcinoma. Same day CT scan showed a  4.5 x 2.6 cm asymmetric wall thickening in cecum close to the ileocecal junction.  06/04/2022: laparoscopic right hemicolectomy. Pathology confirms T3N0 adenocarcinoma of the colon.  07/03/2022: establish care with Dr. Leonides Dickson   Interval History:  Natasha Dickson 77 y.o. female with medical history significant for adenocarcinoma of the colon s/p resection who presents for a follow up visit. The patient's last visit was on 07/03/2022. In the interim since the last visit she has had no major changes in her health.  On exam today Natasha Dickson reports that she is "okay".  She notes that she is disheartened by the fact that has been too hot outside for her to work in her flower bed.  There is also loss mess outside that and recently she got stung.  She has been losing a good bit of weight and unfortunate her weight was as low as 179 in May.  Fortunately her weight is rebounding and she is up to 187 pounds while drinking ensures.  She reports her bowel movements are "okay" and occasionally a little loose.  She does that she does have some occasional "episodes of my stomach talking to me".  She does have periodic constipation as well.  Due to this constipation at 1 point she took too many laxatives and experienced diarrhea.  She notes that her energy levels are quite good.  She knows that she is eating and drinking and moving quite well without any difficulty.  Overall she is at her baseline level of health other than the weight loss.  Full 10 point  ROS is otherwise negative.  MEDICAL HISTORY:  Past Medical History:  Diagnosis Date   Breast cancer (HCC) 1999   Stage I in left breast   Chronic combined systolic and diastolic CHF (congestive heart failure) (HCC)    a. 02/2013 Echo: EF 30-35%, diff HK, biatrial enlargement. b. 2D echo 07/2014 showed mildly dilated LV, EF 45-50%, severely dilated left atrium and moderately dilated right atrium.   CKD (chronic kidney disease), stage II    Closed fracture of unspecified part of neck of femur 2011   Right.  Soreness when standing. (Dr. Cleophas Dunker)   Nonischemic cardiomyopathy [facility])    a. 02/2013 Lexi CL: No ischemia, prob attenuation vs scar, EF 40%-->Med Rx.   Persistent atrial fibrillation (HCC)    a. 02/2013: s/p TEE and attempted cardioversion x 2-->failed-->rate controlled with bb/digoxin, Xarelto initiated.    SURGICAL HISTORY: Past Surgical History:  Procedure Laterality Date   BIOPSY  05/29/2022   Procedure: BIOPSY;  Surgeon: Shellia Cleverly, DO;  Location: [facility] ENDOSCOPY;  Service: Gastroenterology;;   CARDIOVERSION N/A 02/18/2013   Procedure: CARDIOVERSION;  Surgeon: Dolores Patty, MD;  Location: [facility] ENDOSCOPY;  Service: Cardiovascular;  Laterality: N/A;   CARDIOVERSION N/A 02/22/2013   Procedure: CARDIOVERSION;  Surgeon: Pricilla Riffle, MD;  [facility] ENDOSCOPY;  Service: Cardiovascular;  Laterality: N/A;   CHOLECYSTECTOMY Right 06/04/2022   Procedure: LAPAROSCOPIC HAND ASSISTED RIGHT COLECTOMY;  Surgeon: Quentin Ore, MD;  Location: [facility] OR;  Service: General;  Laterality: Right;   COLONOSCOPY WITH PROPOFOL  N/A 05/29/2022   Procedure: COLONOSCOPY WITH PROPOFOL;  Surgeon: Shellia Cleverly, DO;  Location: [facility] ENDOSCOPY;  Service: Gastroenterology;  Laterality: N/A;   ESOPHAGOGASTRODUODENOSCOPY (EGD) WITH PROPOFOL N/A 05/29/2022   Procedure: ESOPHAGOGASTRODUODENOSCOPY (EGD) WITH PROPOFOL;  Surgeon: Shellia Cleverly, DO;  Location: [facility] ENDOSCOPY;  Service:  Gastroenterology;  Laterality: N/A;   IR FLUORO GUIDE CV LINE RIGHT  06/02/2022   IR US GUIDE VASC ACCESS RIGHT  06/02/2022   MASTECTOMY  1999   Left breast with saline implant.   PARTIAL HIP ARTHROPLASTY Right    POLYPECTOMY  05/29/2022   Procedure: POLYPECTOMY;  Surgeon: Shellia Cleverly, DO;  Location: [facility] ENDOSCOPY;  Service: Gastroenterology;;   SUBMUCOSAL TATTOO INJECTION  05/29/2022   Procedure: SUBMUCOSAL TATTOO INJECTION;  Surgeon: Shellia Cleverly, DO;  Location: [facility] ENDOSCOPY;  Service: Gastroenterology;;   TEE WITHOUT CARDIOVERSION N/A 02/18/2013   Procedure: TRANSESOPHAGEAL ECHOCARDIOGRAM (TEE);  Surgeon: Dolores Patty, MD;  Location: [facility] ENDOSCOPY;  Service: Cardiovascular;  Laterality: N/A;   tubal ligation     TUBAL LIGATION      SOCIAL HISTORY: Social History   Socioeconomic History   Marital status: Married    Spouse name: Not on file   Number of children: Not on file   Years of education: Not on file   Highest education level: Not on file  Occupational History   Occupation: Conservation officer, nature    Employer: [employer]  Tobacco Use   Smoking status: Never   Smokeless tobacco: Never  Vaping Use   Vaping Use: Never used  Substance and Sexual Activity   Alcohol use: Yes    Alcohol/week: 0.0 standard drinks of alcohol    Comment: RARE   Drug use: No   Sexual activity: Never  Other Topics Concern   Not on file  Social History Narrative   High School grad.   Married 40+ years   Conservation officer, nature at [employer]   Social Determinants of Health   Financial Resource Strain: Low Risk  (11/07/2022)   Overall Financial Resource Strain (CARDIA)    Difficulty of Paying Living Expenses: Not hard at all  Food Insecurity: No Food Insecurity (11/07/2022)   Hunger Vital Sign    Worried About Running Out of Food in the Last Year: Never true    Ran Out of Food in the Last Year: Never true  Transportation Needs: No Transportation Needs (07/09/2022)   PRAPARE - Therapist, art (Medical): No    Lack of Transportation (Non-Medical): No  Physical Activity: Inactive (11/07/2022)   Exercise Vital Sign    Days of Exercise per Week: 0 days    Minutes of Exercise per Session: 0 min  Stress: No Stress Concern Present (11/07/2022)   [employer] of Occupational Health - Occupational Stress Questionnaire    Feeling of Stress : Not at all  Social Connections: Moderately Isolated (11/07/2022)   Social Connection and Isolation Panel [NHANES]    Frequency of Communication with Friends and Family: More than three times a week    Frequency of Social Gatherings with Friends and Family: More than three times a week    Attends Religious Services: Never    Database administrator or Organizations: No    Attends Banker Meetings: Never    Marital Status: Married  Catering manager Violence: Not At Risk (11/07/2022)   Humiliation, Afraid, Rape, and Kick questionnaire    Fear of Current or Ex-Partner: No    Emotionally Abused:  No    Physically Abused: No    Sexually Abused: No    FAMILY HISTORY: Family History  Problem Relation Age of Onset   Congenital heart disease Son         transposition of great vessels- corrected with  MI  as an adult   COPD Mother        from long-term smoking   Colon cancer Father    Heart disease Father    Heart attack Father    Hypertension Father    Heart disease Brother    Stroke Neg Hx     ALLERGIES:  has No Known Allergies.  MEDICATIONS:  Current Outpatient Medications  Medication Sig Dispense Refill   acetaminophen (TYLENOL) 325 MG tablet Take 2 tablets (650 mg total) by mouth every 6 (six) hours as needed for mild pain, moderate pain, fever or headache.     atorvastatin (LIPITOR) 10 MG tablet Take 1 tablet (10 mg total) by mouth daily. 90 tablet 2   furosemide (LASIX) 20 MG tablet Take 1 tablet (20 mg total) by mouth daily. 90 tablet 2   metoprolol succinate (TOPROL-XL) 25 MG 24 hr tablet Take 1  tablet (25 mg total) by mouth daily. 90 tablet 2   nitroGLYCERIN (NITROSTAT) 0.4 MG SL tablet DISSOLVE ONE TABLET UNDER THE TONGUE EVERY 5 MINUTES AS NEEDED FOR CHEST PAIN.  DO NOT EXCEED A TOTAL OF 3 DOSES IN 15 MINUTES 75 tablet 0   potassium chloride (KLOR-CON M) 10 MEQ tablet Take 1 tablet (10 mEq total) by mouth daily.     rivaroxaban (XARELTO) 20 MG TABS tablet Take 1 tablet (20 mg total) by mouth daily with supper. 90 tablet 1   No current facility-administered medications for this visit.    REVIEW OF SYSTEMS:   Constitutional: ( - ) fevers, ( - )  chills , ( - ) night sweats Eyes: ( - ) blurriness of vision, ( - ) double vision, ( - ) watery eyes Ears, nose, mouth, throat, and face: ( - ) mucositis, ( - ) sore throat Respiratory: ( - ) cough, ( - ) dyspnea, ( - ) wheezes Cardiovascular: ( - ) palpitation, ( - ) chest discomfort, ( - ) lower extremity swelling Gastrointestinal:  ( - ) nausea, ( - ) heartburn, ( - ) change in bowel habits Skin: ( - ) abnormal skin rashes Lymphatics: ( - ) new lymphadenopathy, ( - ) easy bruising Neurological: ( - ) numbness, ( - ) tingling, ( - ) new weaknesses Behavioral/Psych: ( - ) mood change, ( - ) new changes  All other systems were reviewed with the patient and are negative.  PHYSICAL EXAMINATION:  Vitals:   11/29/22 1323  BP: (!) 131/92  Pulse: 87  Resp: 16  Temp: 98.1 F (36.7 C)  SpO2: 99%   Filed Weights   11/29/22 1323  Weight: 187 lb 6.4 oz (85 kg)    GENERAL: Well-appearing elderly Caucasian female, alert, no distress and comfortable SKIN: skin color, texture, turgor are normal, no rashes or significant lesions EYES: conjunctiva are pink and non-injected, sclera clear LUNGS: clear to auscultation and percussion with normal breathing effort HEART: regular rate & rhythm and no murmurs and no lower extremity edema Musculoskeletal: no cyanosis of digits and no clubbing  PSYCH: alert & oriented x 3, fluent speech NEURO: no  focal motor/sensory deficits  LABORATORY DATA:  I have reviewed the data as listed    Latest Ref Rng & Units  11/29/2022   12:43 PM 11/05/2022    9:33 AM 08/19/2022   12:24 PM  CBC  WBC 4.0 - 10.5 K/uL 6.6  7.2  10.9   Hemoglobin 12.0 - 15.0 g/dL 19.1  47.8  29.5   Hematocrit 36.0 - 46.0 % 43.3  43.2  42.9   Platelets 150 - 400 K/uL 261  271  496.0        Latest Ref Rng & Units 11/29/2022   12:43 PM 11/05/2022    9:33 AM 08/19/2022   12:24 PM  CMP  Glucose 70 - 99 mg/dL 83  621  98   BUN 8 - 23 mg/dL 11  13  11    Creatinine 0.44 - 1.00 mg/dL 3.08  6.57  8.46   Sodium 135 - 145 mmol/L 138  141  137   Potassium 3.5 - 5.1 mmol/L 4.3  3.7  3.2   Chloride 98 - 111 mmol/L 106  103  100   CO2 22 - 32 mmol/L 26  22  23    Calcium 8.9 - 10.3 mg/dL 9.8  9.3  9.2   Total Protein 6.5 - 8.1 g/dL 7.2     Total Bilirubin 0.3 - 1.2 mg/dL 1.2     Alkaline Phos 38 - 126 U/L 89     AST 15 - 41 U/L 16     ALT 0 - 44 U/L 10       RADIOGRAPHIC STUDIES: No results found.  ASSESSMENT & PLAN Natasha Dickson 77 y.o. female with medical history significant for adenocarcinoma of the colon s/p resection who presents for a follow up visit.   # Stage II (T3N0M0) Adenocarcinoma of the Colon -- Pathology results status post resection are consistent with a stage II adenocarcinoma the colon.  Findings appear low risk at this time.  No indication for adjuvant chemotherapy. --Plan for CT scans every 12 months for the next 5 years. (Next due in Dec 2024). Plan for return to clinic every 6 months time for repeat labs and evaluation. --Labs today show white blood cell count 6.6, hemoglobin 14.1, MCV 95.6, and platelets of 261 --Patient will require colonoscopy at the 1 year mark after surgery.  That will be due in December 2024. --RTC for 6 months for labs and clinic visit   Orders Placed This Encounter  Procedures   CT CHEST ABDOMEN PELVIS W CONTRAST    Standing Status:   Future    Standing Expiration Date:    11/29/2023    Order Specific Question:   If indicated for the ordered procedure, I authorize the administration of contrast media per Radiology protocol    Answer:   Yes    Order Specific Question:   Does the patient have a contrast media/X-ray dye allergy?    Answer:   No    Order Specific Question:   Preferred imaging location?    Answer:   [facility]    Order Specific Question:   If indicated for the ordered procedure, I authorize the administration of oral contrast media per Radiology protocol    Answer:   Yes    All questions were answered. The patient knows to call the clinic with any problems, questions or concerns.  A total of more than 30 minutes were spent on this encounter with face-to-face time and non-face-to-face time, including preparing to see the patient, ordering tests and/or medications, counseling the patient and coordination of care as outlined above.   Ulysees Barns, MD  Department of Hematology/Oncology [facility] at [facility] Phone: [phone] Pager: [phone] Email: [email or web address]  11/29/2022 4:12 PM

## 2023-01-01 ENCOUNTER — Other Ambulatory Visit: Payer: Medicare Other

## 2023-01-01 ENCOUNTER — Ambulatory Visit: Payer: Medicare Other | Admitting: Hematology and Oncology

## 2023-04-21 ENCOUNTER — Other Ambulatory Visit: Payer: Self-pay

## 2023-04-21 DIAGNOSIS — I5042 Chronic combined systolic (congestive) and diastolic (congestive) heart failure: Secondary | ICD-10-CM

## 2023-04-21 DIAGNOSIS — E782 Mixed hyperlipidemia: Secondary | ICD-10-CM

## 2023-04-21 DIAGNOSIS — C189 Malignant neoplasm of colon, unspecified: Secondary | ICD-10-CM

## 2023-04-21 DIAGNOSIS — E876 Hypokalemia: Secondary | ICD-10-CM

## 2023-04-21 DIAGNOSIS — I82A11 Acute embolism and thrombosis of right axillary vein: Secondary | ICD-10-CM

## 2023-04-21 DIAGNOSIS — N182 Chronic kidney disease, stage 2 (mild): Secondary | ICD-10-CM

## 2023-04-21 DIAGNOSIS — D500 Iron deficiency anemia secondary to blood loss (chronic): Secondary | ICD-10-CM

## 2023-04-21 DIAGNOSIS — I4891 Unspecified atrial fibrillation: Secondary | ICD-10-CM

## 2023-04-21 DIAGNOSIS — I10 Essential (primary) hypertension: Secondary | ICD-10-CM

## 2023-04-21 DIAGNOSIS — Z7901 Long term (current) use of anticoagulants: Secondary | ICD-10-CM

## 2023-04-21 DIAGNOSIS — Z79899 Other long term (current) drug therapy: Secondary | ICD-10-CM

## 2023-04-21 DIAGNOSIS — I4821 Permanent atrial fibrillation: Secondary | ICD-10-CM

## 2023-04-21 MED ORDER — RIVAROXABAN 20 MG PO TABS
20.0000 mg | ORAL_TABLET | Freq: Every day | ORAL | 1 refills | Status: DC
Start: 2023-04-21 — End: 2023-09-25

## 2023-04-21 NOTE — Telephone Encounter (Signed)
Prescription refill request for Xarelto received.  Indication:AFIB Last office visit:5/24 Weight:85  kg Age:77 Scr:1.14  6/24 CrCl:55.46  ml/min  Prescription refilled

## 2023-05-06 ENCOUNTER — Encounter: Payer: Self-pay | Admitting: Cardiology

## 2023-05-06 ENCOUNTER — Ambulatory Visit: Payer: Medicare Other | Attending: Cardiology | Admitting: Cardiology

## 2023-05-06 VITALS — BP 132/74 | HR 122 | Ht 62.0 in | Wt 202.0 lb

## 2023-05-06 DIAGNOSIS — I4821 Permanent atrial fibrillation: Secondary | ICD-10-CM | POA: Insufficient documentation

## 2023-05-06 DIAGNOSIS — E876 Hypokalemia: Secondary | ICD-10-CM | POA: Diagnosis not present

## 2023-05-06 DIAGNOSIS — E782 Mixed hyperlipidemia: Secondary | ICD-10-CM | POA: Diagnosis not present

## 2023-05-06 DIAGNOSIS — Z7901 Long term (current) use of anticoagulants: Secondary | ICD-10-CM | POA: Diagnosis not present

## 2023-05-06 DIAGNOSIS — I5042 Chronic combined systolic (congestive) and diastolic (congestive) heart failure: Secondary | ICD-10-CM | POA: Diagnosis not present

## 2023-05-06 DIAGNOSIS — I4891 Unspecified atrial fibrillation: Secondary | ICD-10-CM | POA: Diagnosis not present

## 2023-05-06 DIAGNOSIS — D500 Iron deficiency anemia secondary to blood loss (chronic): Secondary | ICD-10-CM | POA: Diagnosis not present

## 2023-05-06 DIAGNOSIS — Z79899 Other long term (current) drug therapy: Secondary | ICD-10-CM | POA: Insufficient documentation

## 2023-05-06 DIAGNOSIS — N182 Chronic kidney disease, stage 2 (mild): Secondary | ICD-10-CM | POA: Insufficient documentation

## 2023-05-06 DIAGNOSIS — I10 Essential (primary) hypertension: Secondary | ICD-10-CM | POA: Insufficient documentation

## 2023-05-06 DIAGNOSIS — C189 Malignant neoplasm of colon, unspecified: Secondary | ICD-10-CM | POA: Insufficient documentation

## 2023-05-06 DIAGNOSIS — I82A11 Acute embolism and thrombosis of right axillary vein: Secondary | ICD-10-CM | POA: Diagnosis not present

## 2023-05-06 MED ORDER — METOPROLOL SUCCINATE ER 25 MG PO TB24
25.0000 mg | ORAL_TABLET | Freq: Every day | ORAL | 3 refills | Status: DC
Start: 2023-05-06 — End: 2024-03-02

## 2023-05-06 NOTE — Patient Instructions (Signed)
Medication Instructions:  The current medical regimen is effective;  continue present plan and medications.  *If you need a refill on your cardiac medications before your next appointment, please call your pharmacy*  Follow-Up: At [facility], you and your health needs are our priority.  As part of our continuing mission to provide you with exceptional heart care, we have created designated Provider Care Teams.  These Care Teams include your primary Cardiologist (physician) and Advanced Practice Providers (APPs -  Physician Assistants and Nurse Practitioners) who all work together to provide you with the care you need, when you need it.  We recommend signing up for the patient portal called "MyChart".  Sign up information is provided on this After Visit Summary.  MyChart is used to connect with patients for Virtual Visits (Telemedicine).  Patients are able to view lab/test results, encounter notes, upcoming appointments, etc.  Non-urgent messages can be sent to your provider as well.   To learn more about what you can do with MyChart, go to [email or web address].    Your next appointment:   6 month(s)  Provider:   Jari Favre, PA-C, Robin Searing, NP, Jacolyn Reedy, PA-C, Eligha Bridegroom, NP, Tereso Newcomer, PA-C, or Perlie Gold, PA-C

## 2023-05-06 NOTE — Progress Notes (Signed)
Cardiology Office Note:  .   Date:  05/06/2023  ID:  Shawnie Pons, DOB 10-Dec-1945, MRN [id number] PCP: Joaquim Nam, MD  [facility] Providers Cardiologist:  Meriam Sprague, MD (Inactive)     History of Present Illness: .   RILDA NORWAY is a 77 y.o. female Discussed with the use of AI scribe   History of Present Illness   The patient, a 77 year old with a history of combined systolic and diastolic heart failure diagnosed in 2014, presents for follow-up. The heart failure was initially thought to be tachycardia-induced cardiomyopathy in the context of atrial fibrillation with rapid ventricular response. The ejection fraction improved from 30% to 50% at one point. In December 2023, the patient was admitted with generalized weakness and found to have anemia with atrial fibrillation with rapid ventricular response. A colonoscopy revealed a completely obstructing malignant tumor in the cecum, consistent with invasive adenocarcinoma. The patient underwent hemicolectomy and subsequently developed a right brachial vein deep vein thrombosis and superficial venous thrombosis, for which Xarelto was initiated.  The patient has been doing fairly well since the surgery and is being followed by oncology. There has been some discussion about memory impairment. The patient has been ambulating slowly and has not been taking metoprolol, which was prescribed for the atrial fibrillation. The patient also has a prescription for Lasix, which is taken as needed, but has been avoiding it due to its diuretic effects. The patient has been managing her diet, particularly her salt intake, to help control fluid retention.  In 2023, the patient was diagnosed with adenocarcinoma of the cecum and underwent hemicolectomy. The patient developed a deep vein thrombosis and superficial venous thrombosis postoperatively and was started on Xarelto. The patient has been followed by oncology since the surgery and is due  for a repeat PET scan in December. The patient's CHA2DS2-VASc score is 5. The patient has been managing her heart failure with metoprolol 25 mg daily and Lasix 20 mg as needed. The patient has been ambulating slowly and there has been some discussion about memory impairment.         Studies Reviewed: Marland Kitchen   EKG Interpretation Date/Time:  Tuesday May 06 2023 10:40:33 EST Ventricular Rate:  122 PR Interval:    QRS Duration:  70 QT Interval:  332 QTC Calculation: 473 R Axis:   60  Text Interpretation: Atrial fibrillation with rapid ventricular response Nonspecific T wave abnormality When compared with ECG of 03-Jun-2022 00:20, Nonspecific T wave abnormality has replaced inverted T waves in Inferior leads T wave inversion no longer evident in Anterior leads Confirmed by Donato Schultz ([id number]) on 05/06/2023 10:45:42 AM    Results LABS LDL: 86 (05/2022)  RADIOLOGY CT Chest: No metastasis (05/2022)  DIAGNOSTIC Colonoscopy: Malignant completely obstructing tumor in the cecum compatible with invasive adenocarcinoma (05/2022)  PATHOLOGY Hemicolectomy Pathology: 2.5 x 4 cm adenocarcinoma (05/2022)  Risk Assessment/Calculations:            Physical Exam:   VS:  BP 132/74   Pulse (!) 122   Ht 5\' 2"  (1.575 m)   Wt 202 lb (91.6 kg)   SpO2 96%   BMI 36.95 kg/m    Wt Readings from Last 3 Encounters:  05/06/23 202 lb (91.6 kg)  11/29/22 187 lb 6.4 oz (85 kg)  11/22/22 183 lb (83 kg)    GEN: Well nourished, well developed in no acute distress NECK: No JVD; No carotid bruits CARDIAC: Irreg tachy, no murmurs, no rubs,  no gallops RESPIRATORY:  Clear to auscultation without rales, wheezing or rhonchi  ABDOMEN: Soft, non-tender, non-distended EXTREMITIES:  No edema; No deformity   ASSESSMENT AND PLAN: .    Assessment and Plan    Chronic Combined Systolic and Diastolic Heart Failure Chronic combined systolic and diastolic heart failure, initially diagnosed in 2014. History of  tachycardia-induced cardiomyopathy in the context of AFib with RVR. EF improved from 30% to 50%. Currently managed with metoprolol and Lasix PRN. Non-adherence to metoprolol leading to tachycardia. Emphasized medication adherence and low-salt diet to manage symptoms and prevent exacerbations. - Continue metoprolol 25 mg daily - Ensure metoprolol refills - Educate on medication adherence - Use Lasix 20 mg PRN - Advise on low-salt diet  Atrial Fibrillation with Rapid Ventricular Response (AFib with RVR) Chronic AFib with RVR, managed with metoprolol. Non-adherence to metoprolol leading to tachycardia. Emphasized medication adherence to control heart rate and prevent complications. - Continue metoprolol 25 mg daily - Ensure metoprolol refills - Educate on medication adherence  Deep Vein Thrombosis (DVT) and Superficial Vein Thrombosis (SVT) Right brachial vein DVT and SVT, managed with Xarelto. Compliant with Xarelto. Emphasized continued anticoagulation to prevent thromboembolic events. - Continue Xarelto as prescribed  Invasive Adenocarcinoma of the Cecum Malignant obstructing tumor in the cecum, compatible with invasive adenocarcinoma. Underwent hemicolectomy. No chemotherapy or radiation required. Followed by oncology with regular PET scans. Next PET scan scheduled for December. - Continue follow-up with oncology - Next PET scan in December  General Health Maintenance Discussed the importance of exercise and medication adherence. Encouraged regular physical activity to improve overall health and mobility. - Encourage regular physical activity, such as walking to the mailbox - Ensure all medications are organized and taken as prescribed  Follow-up - Follow up in six months with an APP.               Signed, Donato Schultz, MD

## 2023-05-16 ENCOUNTER — Telehealth: Payer: Self-pay | Admitting: *Deleted

## 2023-05-16 NOTE — Telephone Encounter (Signed)
Received call from pt's daughter asking about her mother's CT scan that needs to happen this month prior to seeing Dr. Leonides Schanz. Advised that I can see the order and it has been authorized. Provided phone # to Radiology scheduling so she can get this scheduled. Consuella Lose voiced appreciation for the call.

## 2023-05-23 ENCOUNTER — Ambulatory Visit (HOSPITAL_COMMUNITY)
Admission: RE | Admit: 2023-05-23 | Discharge: 2023-05-23 | Disposition: A | Discharge status: Home / Self Care | Payer: Medicare Other | Source: Ambulatory Visit | Attending: Hematology and Oncology | Admitting: Hematology and Oncology

## 2023-05-23 DIAGNOSIS — C182 Malignant neoplasm of ascending colon: Secondary | ICD-10-CM | POA: Insufficient documentation

## 2023-05-23 DIAGNOSIS — I700 Atherosclerosis of aorta: Secondary | ICD-10-CM | POA: Diagnosis not present

## 2023-05-23 DIAGNOSIS — K5730 Diverticulosis of large intestine without perforation or abscess without bleeding: Secondary | ICD-10-CM | POA: Diagnosis not present

## 2023-05-23 DIAGNOSIS — C189 Malignant neoplasm of colon, unspecified: Secondary | ICD-10-CM | POA: Diagnosis not present

## 2023-05-23 DIAGNOSIS — K8020 Calculus of gallbladder without cholecystitis without obstruction: Secondary | ICD-10-CM | POA: Diagnosis not present

## 2023-05-23 MED ORDER — IOHEXOL 300 MG/ML  SOLN
100.0000 mL | Freq: Once | INTRAMUSCULAR | Status: AC | PRN
  Administered 2023-05-23: 100 mL via INTRAVENOUS

## 2023-05-30 ENCOUNTER — Other Ambulatory Visit: Payer: Self-pay | Admitting: Hematology and Oncology

## 2023-05-30 ENCOUNTER — Inpatient Hospital Stay: Payer: Medicare Other | Attending: Internal Medicine

## 2023-05-30 ENCOUNTER — Inpatient Hospital Stay (HOSPITAL_BASED_OUTPATIENT_CLINIC_OR_DEPARTMENT_OTHER): Payer: Medicare Other | Admitting: Hematology and Oncology

## 2023-05-30 VITALS — BP 113/71 | HR 69 | Temp 97.5°F | Resp 19 | Ht 62.0 in | Wt 205.9 lb

## 2023-05-30 DIAGNOSIS — D500 Iron deficiency anemia secondary to blood loss (chronic): Secondary | ICD-10-CM

## 2023-05-30 DIAGNOSIS — K5730 Diverticulosis of large intestine without perforation or abscess without bleeding: Secondary | ICD-10-CM | POA: Insufficient documentation

## 2023-05-30 DIAGNOSIS — Z85038 Personal history of other malignant neoplasm of large intestine: Secondary | ICD-10-CM | POA: Insufficient documentation

## 2023-05-30 DIAGNOSIS — Z79899 Other long term (current) drug therapy: Secondary | ICD-10-CM | POA: Insufficient documentation

## 2023-05-30 DIAGNOSIS — Z7901 Long term (current) use of anticoagulants: Secondary | ICD-10-CM | POA: Diagnosis not present

## 2023-05-30 DIAGNOSIS — C182 Malignant neoplasm of ascending colon: Secondary | ICD-10-CM | POA: Diagnosis not present

## 2023-05-30 DIAGNOSIS — I4819 Other persistent atrial fibrillation: Secondary | ICD-10-CM | POA: Insufficient documentation

## 2023-05-30 DIAGNOSIS — Z08 Encounter for follow-up examination after completed treatment for malignant neoplasm: Secondary | ICD-10-CM | POA: Insufficient documentation

## 2023-05-30 DIAGNOSIS — Z853 Personal history of malignant neoplasm of breast: Secondary | ICD-10-CM | POA: Diagnosis not present

## 2023-05-30 DIAGNOSIS — N200 Calculus of kidney: Secondary | ICD-10-CM | POA: Diagnosis not present

## 2023-05-30 DIAGNOSIS — I700 Atherosclerosis of aorta: Secondary | ICD-10-CM | POA: Diagnosis not present

## 2023-05-30 LAB — CMP (CANCER CENTER ONLY)
ALT: 7 U/L (ref 0–44)
AST: 15 U/L (ref 15–41)
Albumin: 4 g/dL (ref 3.5–5.0)
Alkaline Phosphatase: 106 U/L (ref 38–126)
Anion gap: 7 (ref 5–15)
BUN: 12 mg/dL (ref 8–23)
CO2: 23 mmol/L (ref 22–32)
Calcium: 9.2 mg/dL (ref 8.9–10.3)
Chloride: 109 mmol/L (ref 98–111)
Creatinine: 0.96 mg/dL (ref 0.44–1.00)
GFR, Estimated: >60 mL/min (ref >60)
Glucose, Bld: 93 mg/dL (ref 70–99)
Potassium: 4.3 mmol/L (ref 3.5–5.1)
Sodium: 139 mmol/L (ref 135–145)
Total Bilirubin: 0.9 mg/dL (ref <1.2)
Total Protein: 7.1 g/dL (ref 6.5–8.1)

## 2023-05-30 LAB — CBC WITH DIFFERENTIAL (CANCER CENTER ONLY)
Abs Immature Granulocytes: 0.03 10*3/uL (ref 0.00–0.07)
Basophils Absolute: 0 10*3/uL (ref 0.0–0.1)
Basophils Relative: 1 %
Eosinophils Absolute: 0.2 10*3/uL (ref 0.0–0.5)
Eosinophils Relative: 3 %
HCT: 47.5 % — ABNORMAL HIGH (ref 36.0–46.0)
Hemoglobin: 15.6 g/dL — ABNORMAL HIGH (ref 12.0–15.0)
Immature Granulocytes: 1 %
Lymphocytes Relative: 26 %
Lymphs Abs: 1.6 10*3/uL (ref 0.7–4.0)
MCH: 30.9 pg (ref 26.0–34.0)
MCHC: 32.8 g/dL (ref 30.0–36.0)
MCV: 94.1 fL (ref 80.0–100.0)
Monocytes Absolute: 0.5 10*3/uL (ref 0.1–1.0)
Monocytes Relative: 8 %
Neutro Abs: 3.9 10*3/uL (ref 1.7–7.7)
Neutrophils Relative %: 61 %
Platelet Count: 194 10*3/uL (ref 150–400)
RBC: 5.05 MIL/uL (ref 3.87–5.11)
RDW: 14.6 % (ref 11.5–15.5)
WBC Count: 6.3 10*3/uL (ref 4.0–10.5)
nRBC: 0 % (ref 0.0–0.2)

## 2023-05-30 LAB — FERRITIN: Ferritin: 40 ng/mL (ref 11–307)

## 2023-05-30 LAB — RETIC PANEL
Immature Retic Fract: 13.2 % (ref 2.3–15.9)
RBC.: 4.96 MIL/uL (ref 3.87–5.11)
Retic Count, Absolute: 74.4 10*3/uL (ref 19.0–186.0)
Retic Ct Pct: 1.5 % (ref 0.4–3.1)
Reticulocyte Hemoglobin: 34.8 pg (ref >27.9)

## 2023-05-30 LAB — IRON AND IRON BINDING CAPACITY (CC-WL,HP ONLY)
Iron: 81 ug/dL (ref 28–170)
Saturation Ratios: 18 % (ref 10.4–31.8)
TIBC: 458 ug/dL — ABNORMAL HIGH (ref 250–450)
UIBC: 377 ug/dL (ref 148–442)

## 2023-05-30 NOTE — Progress Notes (Signed)
[facility] [phone]   [phone]  PROGRESS NOTE  Patient Care Team: Joaquim Nam, MD as PCP - General (Family Medicine) Meriam Sprague, MD (Inactive) as PCP - Cardiology (Cardiology)  Hematological/Oncological History # Stage II (T3N0M0) Adenocarcinoma of the Colon 05/29/2022: colonoscopy performed and showed large cecal mass. Pathology confirmed an invasive moderately differentiated adenocarcinoma. Same day CT scan showed a  4.5 x 2.6 cm asymmetric wall thickening in cecum close to the ileocecal junction.  06/04/2022: laparoscopic right hemicolectomy. Pathology confirms T3N0 adenocarcinoma of the colon.  07/03/2022: establish care with Dr. Leonides Schanz   Interval History:  Natasha Dickson 77 y.o. female with medical history significant for adenocarcinoma of the colon s/p resection who presents for a follow up visit. The patient's last visit was on 07/03/2022. In the interim since the last visit she has had no major changes in her health.  On exam today Mrs. Merta reports she has been well overall in the interim since her last visit 6 months ago.  She reports her energy today is about a 5 out of 10.  She is not having any lightheadedness, dizziness, shortness of breath.  She reports her appetite does appear to be coming back but she is not eating quite as much as she was before.  She had a bout of food poisoning and she is unsure how it occurred.  She may trail mix and thinks one of the ingredients caused it.  She reports that she will be throwing out all the old ingredients.  She is taking Xarelto and not having any overt signs of bleeding but does have some occasional bruising.  She denies any fevers, chills, sweats, nausea, vomiting or diarrhea.  A full 10 point ROS is otherwise negative.  Overall she is at her baseline level of health.  Full 10 point ROS is otherwise negative.  MEDICAL HISTORY:  Past Medical History:  Diagnosis Date   Breast cancer  (HCC) 1999   Stage I in left breast   Chronic combined systolic and diastolic CHF (congestive heart failure) (HCC)    a. 02/2013 Echo: EF 30-35%, diff HK, biatrial enlargement. b. 2D echo 07/2014 showed mildly dilated LV, EF 45-50%, severely dilated left atrium and moderately dilated right atrium.   CKD (chronic kidney disease), stage II    Closed fracture of unspecified part of neck of femur 2011   Right.  Soreness when standing. (Dr. Cleophas Dunker)   Nonischemic cardiomyopathy [facility])    a. 02/2013 Lexi CL: No ischemia, prob attenuation vs scar, EF 40%-->Med Rx.   Persistent atrial fibrillation (HCC)    a. 02/2013: s/p TEE and attempted cardioversion x 2-->failed-->rate controlled with bb/digoxin, Xarelto initiated.    SURGICAL HISTORY: Past Surgical History:  Procedure Laterality Date   BIOPSY  05/29/2022   Procedure: BIOPSY;  Surgeon: Shellia Cleverly, DO;  Location: [facility] ENDOSCOPY;  Service: Gastroenterology;;   CARDIOVERSION N/A 02/18/2013   Procedure: CARDIOVERSION;  Surgeon: Dolores Patty, MD;  Location: [facility] ENDOSCOPY;  Service: Cardiovascular;  Laterality: N/A;   CARDIOVERSION N/A 02/22/2013   Procedure: CARDIOVERSION;  Surgeon: Pricilla Riffle, MD;  Location: [facility] ENDOSCOPY;  Service: Cardiovascular;  Laterality: N/A;   CHOLECYSTECTOMY Right 06/04/2022   Procedure: LAPAROSCOPIC HAND ASSISTED RIGHT COLECTOMY;  Surgeon: Quentin Ore, MD;  Location: [facility] OR;  Service: General;  Laterality: Right;   COLONOSCOPY WITH PROPOFOL N/A 05/29/2022   Procedure: COLONOSCOPY WITH PROPOFOL;  Surgeon: Shellia Cleverly, DO;  Location: [facility] ENDOSCOPY;  Service: Gastroenterology;  Laterality: N/A;   ESOPHAGOGASTRODUODENOSCOPY (EGD) WITH PROPOFOL N/A 05/29/2022   Procedure: ESOPHAGOGASTRODUODENOSCOPY (EGD) WITH PROPOFOL;  Surgeon: Shellia Cleverly, DO;  Location: [facility] ENDOSCOPY;  Service: Gastroenterology;  Laterality: N/A;   IR FLUORO GUIDE CV LINE RIGHT  06/02/2022   IR US GUIDE VASC ACCESS RIGHT   06/02/2022   MASTECTOMY  1999   Left breast with saline implant.   PARTIAL HIP ARTHROPLASTY Right    POLYPECTOMY  05/29/2022   Procedure: POLYPECTOMY;  Surgeon: Shellia Cleverly, DO;  Location: [facility] ENDOSCOPY;  Service: Gastroenterology;;   SUBMUCOSAL TATTOO INJECTION  05/29/2022   Procedure: SUBMUCOSAL TATTOO INJECTION;  Surgeon: Shellia Cleverly, DO;  Location: [facility] ENDOSCOPY;  Service: Gastroenterology;;   TEE WITHOUT CARDIOVERSION N/A 02/18/2013   Procedure: TRANSESOPHAGEAL ECHOCARDIOGRAM (TEE);  Surgeon: Dolores Patty, MD;  Location[facility] ENDOSCOPY;  Service: Cardiovascular;  Laterality: N/A;   tubal ligation     TUBAL LIGATION      SOCIAL HISTORY: Social History   Socioeconomic History   Marital status: Married    Spouse name: Not on file   Number of children: Not on file   Years of education: Not on file   Highest education level: Not on file  Occupational History   Occupation: Conservation officer, nature    Employer: [employer]  Tobacco Use   Smoking status: Never   Smokeless tobacco: Never  Vaping Use   Vaping status: Never Used  Substance and Sexual Activity   Alcohol use: Yes    Alcohol/week: 0.0 standard drinks of alcohol    Comment: RARE   Drug use: No   Sexual activity: Never  Other Topics Concern   Not on file  Social History Narrative   High School grad.   Married 40+ years   Conservation officer, nature at [employer]   Social Drivers of Health   Financial Resource Strain: Low Risk  (11/07/2022)   Overall Financial Resource Strain (CARDIA)    Difficulty of Paying Living Expenses: Not hard at all  Food Insecurity: No Food Insecurity (11/07/2022)   Hunger Vital Sign    Worried About Running Out of Food in the Last Year: Never true    Ran Out of Food in the Last Year: Never true  Transportation Needs: No Transportation Needs (07/09/2022)   PRAPARE - Administrator, Civil Service (Medical): No    Lack of Transportation (Non-Medical): No  Physical Activity: Inactive  (11/07/2022)   Exercise Vital Sign    Days of Exercise per Week: 0 days    Minutes of Exercise per Session: 0 min  Stress: No Stress Concern Present (11/07/2022)   [employer] of Occupational Health - Occupational Stress Questionnaire    Feeling of Stress : Not at all  Social Connections: Moderately Isolated (11/07/2022)   Social Connection and Isolation Panel [NHANES]    Frequency of Communication with Friends and Family: More than three times a week    Frequency of Social Gatherings with Friends and Family: More than three times a week    Attends Religious Services: Never    Database administrator or Organizations: No    Attends Banker Meetings: Never    Marital Status: Married  Catering manager Violence: Not At Risk (11/07/2022)   Humiliation, Afraid, Rape, and Kick questionnaire    Fear of Current or Ex-Partner: No    Emotionally Abused: No    Physically Abused: No    Sexually Abused: No    FAMILY HISTORY: Family  History  Problem Relation Age of Onset   Congenital heart disease Son         transposition of great vessels- corrected with  MI  as an adult   COPD Mother        from long-term smoking   Colon cancer Father    Heart disease Father    Heart attack Father    Hypertension Father    Heart disease Brother    Stroke Neg Hx     ALLERGIES:  has no known allergies.  MEDICATIONS:  Current Outpatient Medications  Medication Sig Dispense Refill   acetaminophen (TYLENOL) 325 MG tablet Take 2 tablets (650 mg total) by mouth every 6 (six) hours as needed for mild pain, moderate pain, fever or headache.     atorvastatin (LIPITOR) 10 MG tablet Take 1 tablet (10 mg total) by mouth daily. 90 tablet 2   furosemide (LASIX) 20 MG tablet Take 1 tablet (20 mg total) by mouth daily. 90 tablet 2   metoprolol succinate (TOPROL-XL) 25 MG 24 hr tablet Take 1 tablet (25 mg total) by mouth daily. 90 tablet 3   nitroGLYCERIN (NITROSTAT) 0.4 MG SL tablet DISSOLVE ONE  TABLET UNDER THE TONGUE EVERY 5 MINUTES AS NEEDED FOR CHEST PAIN.  DO NOT EXCEED A TOTAL OF 3 DOSES IN 15 MINUTES 75 tablet 0   potassium chloride (KLOR-CON M) 10 MEQ tablet Take 1 tablet (10 mEq total) by mouth daily.     rivaroxaban (XARELTO) 20 MG TABS tablet Take 1 tablet (20 mg total) by mouth daily with supper. 90 tablet 1   No current facility-administered medications for this visit.    REVIEW OF SYSTEMS:   Constitutional: ( - ) fevers, ( - )  chills , ( - ) night sweats Eyes: ( - ) blurriness of vision, ( - ) double vision, ( - ) watery eyes Ears, nose, mouth, throat, and face: ( - ) mucositis, ( - ) sore throat Respiratory: ( - ) cough, ( - ) dyspnea, ( - ) wheezes Cardiovascular: ( - ) palpitation, ( - ) chest discomfort, ( - ) lower extremity swelling Gastrointestinal:  ( - ) nausea, ( - ) heartburn, ( - ) change in bowel habits Skin: ( - ) abnormal skin rashes Lymphatics: ( - ) new lymphadenopathy, ( - ) easy bruising Neurological: ( - ) numbness, ( - ) tingling, ( - ) new weaknesses Behavioral/Psych: ( - ) mood change, ( - ) new changes  All other systems were reviewed with the patient and are negative.  PHYSICAL EXAMINATION:  Vitals:   05/30/23 1207  BP: 113/71  Pulse: 69  Resp: 19  Temp: (!) 97.5 F (36.4 C)  SpO2: 97%    Filed Weights   05/30/23 1207  Weight: 205 lb 14.4 oz (93.4 kg)     GENERAL: Well-appearing elderly Caucasian female, alert, no distress and comfortable SKIN: skin color, texture, turgor are normal, no rashes or significant lesions EYES: conjunctiva are pink and non-injected, sclera clear LUNGS: clear to auscultation and percussion with normal breathing effort HEART: regular rate & rhythm and no murmurs and no lower extremity edema Musculoskeletal: no cyanosis of digits and no clubbing  PSYCH: alert & oriented x 3, fluent speech NEURO: no focal motor/sensory deficits  LABORATORY DATA:  I have reviewed the data as listed    Latest Ref  Rng & Units 05/30/2023   11:29 AM 11/29/2022   12:43 PM 11/05/2022    9:33 AM  CBC  WBC 4.0 - 10.5 K/uL 6.3  6.6  7.2   Hemoglobin 12.0 - 15.0 g/dL 54.0  98.1  19.1   Hematocrit 36.0 - 46.0 % 47.5  43.3  43.2   Platelets 150 - 400 K/uL 194  261  271        Latest Ref Rng & Units 05/30/2023   11:29 AM 11/29/2022   12:43 PM 11/05/2022    9:33 AM  CMP  Glucose 70 - 99 mg/dL 93  83  478   BUN 8 - 23 mg/dL 12  11  13    Creatinine 0.44 - 1.00 mg/dL 2.95  6.21  3.08   Sodium 135 - 145 mmol/L 139  138  141   Potassium 3.5 - 5.1 mmol/L 4.3  4.3  3.7   Chloride 98 - 111 mmol/L 109  106  103   CO2 22 - 32 mmol/L 23  26  22    Calcium 8.9 - 10.3 mg/dL 9.2  9.8  9.3   Total Protein 6.5 - 8.1 g/dL 7.1  7.2    Total Bilirubin <1.2 mg/dL 0.9  1.2    Alkaline Phos 38 - 126 U/L 106  89    AST 15 - 41 U/L 15  16    ALT 0 - 44 U/L 7  10      RADIOGRAPHIC STUDIES: CT CHEST ABDOMEN PELVIS W CONTRAST Result Date: 05/23/2023 CLINICAL DATA:  Colon cancer stage II/III, monitor. * Tracking Code: BO * EXAM: CT CHEST, ABDOMEN, AND PELVIS WITH CONTRAST TECHNIQUE: Multidetector CT imaging of the chest, abdomen and pelvis was performed following the standard protocol during bolus administration of intravenous contrast. RADIATION DOSE REDUCTION: This exam was performed according to the departmental dose-optimization program which includes automated exposure control, adjustment of the mA and/or kV according to patient size and/or use of iterative reconstruction technique. CONTRAST:  OMNIPAQUE IOHEXOL 300 MG/ML  SOLN COMPARISON:  CT May 27, 2022 and May 29, 2022 FINDINGS: CT CHEST FINDINGS Cardiovascular: Aortic atherosclerosis. No central pulmonary embolus on this nondedicated study. Biatrial cardiac enlargement. No significant pericardial effusion/thickening. Mediastinum/Nodes: 14 mm nodule in the thyroid isthmus on image 12/2. No follow-up imaging is recommended. No pathologically enlarged mediastinal,  hilar or axillary lymph nodes. The esophagus is grossly unremarkable. Lungs/Pleura: Biapical pleuroparenchymal scarring. No suspicious pulmonary nodules or masses on a slightly motion degraded examination of the lungs. Scattered atelectasis/scarring. Musculoskeletal: No aggressive lytic or blastic lesion of bone. Diffuse demineralization of bone. Multilevel degenerative change of the spine. CT ABDOMEN PELVIS FINDINGS Hepatobiliary: No suspicious hepatic lesion. Cholelithiasis without findings of acute cholecystitis. No biliary ductal dilation. Pancreas: No pancreatic ductal dilation or evidence of acute inflammation. Spleen: No splenomegaly or focal splenic lesion. Adrenals/Urinary Tract: Bilateral adrenal glands are within normal limits. No hydronephrosis. 4 mm nonobstructive right lower pole renal stone. Urinary bladder is minimally distended limiting evaluation. Gas in the urinary bladder, suggest correlation with recent instrumentation. Stomach/Bowel: Radiopaque enteric contrast material traverses the sigmoid colon. Stomach is minimally distended limiting evaluation. No pathologic dilation of small or large bowel. Colonic diverticulosis without findings of acute diverticulitis. Surgical change of right hemicolectomy with ileocolic anastomosis. No suspicious enhancing nodularity along the suture line. Vascular/Lymphatic: Aortic atherosclerosis. Retroaortic left renal vein. Smooth IVC contours. The portal, splenic and superior mesenteric veins are patent. No pathologically enlarged abdominal or pelvic lymph nodes. Reproductive: Uterus and bilateral adnexa are unremarkable. Other: No significant abdominopelvic free fluid. No discrete peritoneal or omental nodularity. Musculoskeletal: No aggressive  lytic or blastic lesion of bone. Diffuse demineralization of bone. Similar mild anterior wedging of the L1 and L3 vertebral bodies. Right total hip arthroplasty. IMPRESSION: 1. Surgical change of right hemicolectomy with  ileocolic anastomosis. No evidence of local recurrence or metastatic disease in the chest, abdomen or pelvis. 2. Cholelithiasis without findings of acute cholecystitis. 3. 4 mm nonobstructive right lower pole renal stone. 4. Colonic diverticulosis without findings of acute diverticulitis. 5.  Aortic Atherosclerosis (ICD10-I70.0). Electronically Signed   By: Maudry Mayhew M.D.   On: 05/23/2023 17:07    ASSESSMENT & PLAN Natasha CHANDLEY 77 y.o. female with medical history significant for adenocarcinoma of the colon s/p resection who presents for a follow up visit.   # Stage II (T3N0M0) Adenocarcinoma of the Colon -- Pathology results status post resection are consistent with a stage II adenocarcinoma the colon.  Findings appear low risk at this time.  No indication for adjuvant chemotherapy. --Plan for CT scans every 12 months for the next 5 years. (Next due in Dec 2024). Plan for return to clinic every 6 months time for repeat labs and evaluation. --Labs today show white blood cell count 6.3, hemoglobin 15.6, MCV 94.1, platelets 194 --Patient will require colonoscopy at the 1 year mark after surgery.  That will be due in December 2024.  Will reach out to GI to see if this can be performed in the near future. --RTC for 6 months for labs and clinic visit   No orders of the defined types were placed in this encounter.   All questions were answered. The patient knows to call the clinic with any problems, questions or concerns.  A total of more than 30 minutes were spent on this encounter with face-to-face time and non-face-to-face time, including preparing to see the patient, ordering tests and/or medications, counseling the patient and coordination of care as outlined above.   Ulysees Barns, MD Department of Hematology/Oncology [facility] at [facility] Phone: [phone] Pager: [phone] Email: [email or web address]  05/30/2023 2:59 PM

## 2023-06-01 ENCOUNTER — Telehealth: Payer: Self-pay | Admitting: Family Medicine

## 2023-06-01 NOTE — Telephone Encounter (Signed)
Please check with patient/family about follow up here at clinic this spring, after her colonoscopy.  Would need 30 min OV.  Thanks.

## 2023-06-02 NOTE — Telephone Encounter (Signed)
Left voicemail for patient to return call to office. 

## 2023-06-05 ENCOUNTER — Telehealth: Payer: Self-pay

## 2023-06-05 ENCOUNTER — Telehealth: Payer: Self-pay | Admitting: *Deleted

## 2023-06-05 DIAGNOSIS — C189 Malignant neoplasm of colon, unspecified: Secondary | ICD-10-CM

## 2023-06-05 NOTE — Telephone Encounter (Signed)
Patient's daughter called to schedule colonoscopy, provided her with the options available for Dr. Frankey Shown schedule, she requested only Friday's which he does not have. Please advise.

## 2023-06-05 NOTE — Telephone Encounter (Signed)
Per Shanda Bumps in Scheduling, patient's daughter who cares for her parents and takes them to appts, has lots of limitations as to when she is available.  Called and spoke to daughter, Natasha Dickson.  We scheduled her mom for colonoscopy on Friday, 07-11-23 at 4pm.  She is on Xarelto.  Request to hold for 2 days sent to [facility] Cardiology pool.  Scheduled her for a PV to go over instructions via TELEPHONE  On Thursday, 1-23rd at 8:30am.

## 2023-06-05 NOTE — Telephone Encounter (Signed)
[facility] Pre-operative Risk Assessment     Request for surgical clearance:     Endoscopy Procedure  What type of surgery is being performed?     COLONOSCOPY  When is this surgery scheduled?     07-11-23  What type of clearance is required ?   Pharmacy  Are there any medications that need to be held prior to surgery and how long? XARELTO 2 DAYS  Practice name and name of physician performing surgery?      [facility] DR Barron Alvine  What is your office phone and fax number?      Phone- [phone]  Fax- [phone]  Anesthesia type (None, local, MAC, general) ?       MAC THANK YOU  Please route your response to NICOLE ROUTH, CMA

## 2023-06-05 NOTE — Telephone Encounter (Signed)
Received call from pt's daughter, Natasha Dickson. She states that she is having a hard time communicating with [facility]  to get her mother's colonoscopy scheduled on a Friday. Natasha Dickson states that her only day off is on Fridays and that she is the one responsible to drive her mother back and forth. Her father is in poor health as well. Call made to [facility] and spoke with scheduling. Advised this person of daughter's concerns about getting her mother's colonoscopy scheduled on a Friday. Pt sees Dr. Hoyle Barr. Per scheduler, he does not work in the [employer] on Fridays. Asked to have his nurse or CMA call the daughter to see if another provider could do the colonoscopy on a Friday. Message will be given to the CMA to call the daughter. TCT Natasha Dickson and advised that I spoke with [facility] office and to expect a call from them regarding this procedure. Natasha Dickson grateful for this intervention. Advised to call back with any other concerns.

## 2023-06-05 NOTE — Telephone Encounter (Signed)
Called patient and left message to call back to get scheduled for colonoscopy in the [facility] with Cirigliano

## 2023-06-05 NOTE — Telephone Encounter (Signed)
-----   Message from Shellia Cleverly sent at 05/30/2023  3:11 PM EST ----- Addison Naegeli,  Absolutely, happy to get her back in.   Joni Reining, can you please schedule this patient for repeat Colo with me in the [facility]? Thanks!  Vito ----- Message ----- From: Jaci Standard, MD Sent: 05/30/2023   2:59 PM EST To: Shellia Cleverly, DO  Ferdie Ping,  I saw Natasha Dickson in clinic today.  She has a history of colon cancer s/p laparoscopic right hemicolectomy in December 2023.  She is currently due for her 1 year follow-up colonoscopy.  I believe you saw her last time.  Would you be able to have her scheduled in the near future for her follow-up colonoscopy?  Best,  Vonna Kotyk

## 2023-06-06 MED ORDER — NA SULFATE-K SULFATE-MG SULF 17.5-3.13-1.6 GM/177ML PO SOLN: 1.0000 | ORAL | 0 refills | Status: DC

## 2023-06-06 NOTE — Telephone Encounter (Signed)
   Patient Name: Natasha Dickson  DOB: [birth year] MRN: [id number]  Primary Cardiologist: Meriam Sprague, MD (Inactive)  Clinical pharmacists have reviewed the patient's past medical history, labs, and current medications as part of preoperative protocol coverage. The following recommendations have been made:  Patient with diagnosis of atrial fibrillation on Xarelto for anticoagulation.   What type of surgery is being performed?     COLONOSCOPY When is this surgery scheduled?     07-11-23    CHA2DS2-VASc Score = 5   This indicates a 7.2% annual risk of stroke. The patient's score is based upon: CHF History: 1 HTN History: 1 Diabetes History: 0 Stroke History: 0 Vascular Disease History: 0 Age Score: 2 Gender Score: 1   CrCl 72 Platelet count 194   Per office protocol, patient can hold Xarelto for 2 days prior to procedure, please resume when safe to do so from a bleeding standpoint.  Patient will not need bridging with Lovenox (enoxaparin) around procedure.   I will route this recommendation to the requesting party via Epic fax function and remove from pre-op pool.  Please call with questions.  Rip Harbour, NP 06/06/2023, 9:13 AM

## 2023-06-06 NOTE — Telephone Encounter (Signed)
Patient advised that she has been given clearance to hold Xarelto 2 days prior to colonoscopy scheduled for 07-11-23.  Patient advised to take last dose of Xarelto on 07-08-23, and she will be advised when to restart Xarelto by Dr Barron Alvine after the procedure.  Patient agreed to plan and verbalized understanding.  No further questions.

## 2023-06-06 NOTE — Telephone Encounter (Signed)
Patient with diagnosis of atrial fibrillation on Xarelto for anticoagulation.    What type of surgery is being performed?     COLONOSCOPY  When is this surgery scheduled?     07-11-23    CHA2DS2-VASc Score = 5   This indicates a 7.2% annual risk of stroke. The patient's score is based upon: CHF History: 1 HTN History: 1 Diabetes History: 0 Stroke History: 0 Vascular Disease History: 0 Age Score: 2 Gender Score: 1   CrCl 72 Platelet count 194  Per office protocol, patient can hold Xarelto for 2 days prior to procedure.   Patient will not need bridging with Lovenox (enoxaparin) around procedure.  **This guidance is not considered finalized until pre-operative APP has relayed final recommendations.**

## 2023-06-10 NOTE — Telephone Encounter (Signed)
Noted, thanks!

## 2023-06-10 NOTE — Telephone Encounter (Signed)
Spoke with patient daughter Consuella Lose and scheduled follow up. She stated that she is taking care of her dad as well and may have to cancel appointment if he has to have cardiac surgery. She stated that she will call back if need be.

## 2023-07-03 ENCOUNTER — Ambulatory Visit (AMBULATORY_SURGERY_CENTER): Payer: Medicare Other

## 2023-07-03 VITALS — Ht 62.0 in | Wt 200.0 lb

## 2023-07-03 DIAGNOSIS — Z85038 Personal history of other malignant neoplasm of large intestine: Secondary | ICD-10-CM

## 2023-07-03 NOTE — Progress Notes (Signed)
Pre visit completed via phone call with daughter Consuella Lose;  [place] approved; Patient verified name, DOB, and address; No egg or soy allergy known to patient;  No issues known to pt with past sedation with any surgeries or procedures; Patient denies ever being told they had issues or difficulty with intubation;  No FH of Malignant Hyperthermia; Pt is not on diet pills; Pt is not on home 02;  Pt is not on blood thinners;  Pt denies issues with constipation/diarrhea;  Hx AFIB Have any cardiac testing pending--NO Insurance verified during PV appt--- Medicare Pt can ambulate without assistance;  Pt denies use of chewing tobacco; Discussed diabetic/weight loss medication holds; Discussed NSAID holds; Checked BMI to be less than 50; Pt instructed to use Single[employer] or GoodRx for a price reduction on prep;  Patient's chart reviewed by Cathlyn Parsons CNRA prior to previsit and patient appropriate for the [facility];  Pre visit completed and red dot placed by patient's name on their procedure day (on provider's schedule);   Instructions sent to MyChart per patient's daughter's request; patient's daughter reports that the prep has already been picked up from the pharmacy; no RX placecd during PV appt;

## 2023-07-11 ENCOUNTER — Ambulatory Visit: Payer: Medicare Other | Admitting: Gastroenterology

## 2023-07-11 ENCOUNTER — Encounter: Payer: Medicare Other | Admitting: Gastroenterology

## 2023-07-11 ENCOUNTER — Encounter: Payer: Self-pay | Admitting: Gastroenterology

## 2023-07-11 VITALS — BP 121/67 | HR 82 | Temp 97.4°F | Resp 20 | Ht 62.0 in | Wt 200.0 lb

## 2023-07-11 DIAGNOSIS — K5730 Diverticulosis of large intestine without perforation or abscess without bleeding: Secondary | ICD-10-CM

## 2023-07-11 DIAGNOSIS — Z85038 Personal history of other malignant neoplasm of large intestine: Secondary | ICD-10-CM

## 2023-07-11 DIAGNOSIS — Z980 Intestinal bypass and anastomosis status: Secondary | ICD-10-CM

## 2023-07-11 DIAGNOSIS — Z800 Family history of malignant neoplasm of digestive organs: Secondary | ICD-10-CM | POA: Diagnosis not present

## 2023-07-11 DIAGNOSIS — Z1211 Encounter for screening for malignant neoplasm of colon: Secondary | ICD-10-CM | POA: Diagnosis not present

## 2023-07-11 DIAGNOSIS — Z09 Encounter for follow-up examination after completed treatment for conditions other than malignant neoplasm: Secondary | ICD-10-CM | POA: Diagnosis not present

## 2023-07-11 DIAGNOSIS — D123 Benign neoplasm of transverse colon: Secondary | ICD-10-CM

## 2023-07-11 DIAGNOSIS — C189 Malignant neoplasm of colon, unspecified: Secondary | ICD-10-CM

## 2023-07-11 DIAGNOSIS — D125 Benign neoplasm of sigmoid colon: Secondary | ICD-10-CM

## 2023-07-11 MED ORDER — SODIUM CHLORIDE 0.9 % IV SOLN: 500.0000 mL | INTRAVENOUS | Status: DC

## 2023-07-11 NOTE — Progress Notes (Signed)
GASTROENTEROLOGY PROCEDURE H&P NOTE   Primary Care Physician: Joaquim Nam, MD    Reason for Procedure:  Colon cancer surveillance  Plan:    Colonoscopy  Patient is appropriate for endoscopic procedure(s) in the ambulatory ([facility]) setting.  The nature of the procedure, as well as the risks, benefits, and alternatives were carefully and thoroughly reviewed with the patient. Ample time for discussion and questions allowed. The patient understood, was satisfied, and agreed to proceed.     HPI: Natasha Dickson is a 78 y.o. female who presents for colonoscopy for ongoing surveillance.  History of Stage II (T3N0M0) Adenocarcinoma of the Colon - 05/29/2022: colonoscopy performed and showed large cecal mass. Pathology confirmed an invasive moderately differentiated adenocarcinoma. Same day CT scan showed a  4.5 x 2.6 cm asymmetric wall thickening in cecum close to the ileocecal junction.  -06/04/2022: laparoscopic right hemicolectomy. Pathology confirms T3N0 adenocarcinoma of the colon.  Follows with Dr. Leonides Schanz in the Oncology Clinic, last seen 05/30/2023.  No indication for adjuvant chemotherapy.  Most recent CT CAP on 05/23/2023 with right hemicolectomy and ileocolonic anastomosis without evidence of local recurrence or metastatic disease.  Plan for CT scans every 12 months x 5 years, and referred to the GI clinic for repeat colonoscopy now for ongoing surveillance 1 year postop.  She is otherwise without active GI symptoms.  Family history notable for father with colon cancer.  Holding Xarelto for procedure today.  Past Medical History:  Diagnosis Date   Breast cancer (HCC) 1999   Stage I in left breast   Chronic combined systolic and diastolic CHF (congestive heart failure) (HCC)    a. 02/2013 Echo: EF 30-35%, diff HK, biatrial enlargement. b. 2D echo 07/2014 showed mildly dilated LV, EF 45-50%, severely dilated left atrium and moderately dilated right atrium.   CKD (chronic kidney  disease), stage II    Closed fracture of unspecified part of neck of femur 2011   Right.  Soreness when standing. (Dr. Cleophas Dunker)   Colon cancer [facility]) 2023   s/p 05/2022 removed colon cancer   Nonischemic cardiomyopathy (HCC)    a. 02/2013 [facility]: No ischemia, prob attenuation vs scar, EF 40%-->Med Rx.   Persistent atrial fibrillation (HCC)    a. 02/2013: s/p TEE and attempted cardioversion x 2-->failed-->rate controlled with bb/digoxin, Xarelto initiated.    Past Surgical History:  Procedure Laterality Date   BIOPSY  05/29/2022   Procedure: BIOPSY;  Surgeon: Shellia Cleverly, DO;  Location: [facility] ENDOSCOPY;  Service: Gastroenterology;;   CARDIOVERSION N/A 02/18/2013   Procedure: CARDIOVERSION;  Surgeon: Dolores Patty, MD;  Location: [facility] ENDOSCOPY;  Service: Cardiovascular;  Laterality: N/A;   CARDIOVERSION N/A 02/22/2013   Procedure: CARDIOVERSION;  Surgeon: Pricilla Riffle, MD;  Location: [facility] ENDOSCOPY;  Service: Cardiovascular;  Laterality: N/A;   CHOLECYSTECTOMY Right 06/04/2022   Procedure: LAPAROSCOPIC HAND ASSISTED RIGHT COLECTOMY;  Surgeon: Quentin Ore, MD;  Location: [facility] OR;  Service: General;  Laterality: Right;   COLONOSCOPY WITH PROPOFOL N/A 05/29/2022   Procedure: COLONOSCOPY WITH PROPOFOL;  Surgeon: Shellia Cleverly, DO;  Location: [facility] ENDOSCOPY;  Service: Gastroenterology;  Laterality: N/A;   ESOPHAGOGASTRODUODENOSCOPY (EGD) WITH PROPOFOL N/A 05/29/2022   Procedure: ESOPHAGOGASTRODUODENOSCOPY (EGD) WITH PROPOFOL;  Surgeon: Shellia Cleverly, DO;  Location: [facility] ENDOSCOPY;  Service: Gastroenterology;  Laterality: N/A;   IR FLUORO GUIDE CV LINE RIGHT  06/02/2022   IR US GUIDE VASC ACCESS RIGHT  06/02/2022   MASTECTOMY  06/10/1997   Left breast with  saline implant.   PARTIAL HIP ARTHROPLASTY Right    POLYPECTOMY  05/29/2022   Procedure: POLYPECTOMY;  Surgeon: Shellia Cleverly, DO;  Location: [facility] ENDOSCOPY;  Service: Gastroenterology;;   SUBMUCOSAL TATTOO INJECTION   05/29/2022   Procedure: SUBMUCOSAL TATTOO INJECTION;  Surgeon: Shellia Cleverly, DO;  Location: [facility] ENDOSCOPY;  Service: Gastroenterology;;   TEE WITHOUT CARDIOVERSION N/A 02/18/2013   Procedure: TRANSESOPHAGEAL ECHOCARDIOGRAM (TEE);  Surgeon: Dolores Patty, MD;  Location: [facility] ENDOSCOPY;  Service: Cardiovascular;  Laterality: N/A;   TUBAL LIGATION      Prior to Admission medications   Medication Sig Start Date End Date Taking? Authorizing Provider  atorvastatin (LIPITOR) 10 MG tablet Take 1 tablet (10 mg total) by mouth daily. 11/05/22  Yes Meriam Sprague, MD  metoprolol succinate (TOPROL-XL) 25 MG 24 hr tablet Take 1 tablet (25 mg total) by mouth daily. 05/06/23  Yes Jake Bathe, MD  potassium chloride (KLOR-CON M) 10 MEQ tablet Take 1 tablet (10 mEq total) by mouth daily. 11/22/22  Yes Joaquim Nam, MD  acetaminophen (TYLENOL) 325 MG tablet Take 2 tablets (650 mg total) by mouth every 6 (six) hours as needed for mild pain, moderate pain, fever or headache. 06/12/22   Tyrone Nine, MD  furosemide (LASIX) 20 MG tablet Take 1 tablet (20 mg total) by mouth daily. Patient not taking: Reported on 07/03/2023 11/05/22   Meriam Sprague, MD  nitroGLYCERIN (NITROSTAT) 0.4 MG SL tablet DISSOLVE ONE TABLET UNDER THE TONGUE EVERY 5 MINUTES AS NEEDED FOR CHEST PAIN.  DO NOT EXCEED A TOTAL OF 3 DOSES IN 15 MINUTES Patient not taking: No sig reported 10/14/19   Lars Masson, MD  rivaroxaban (XARELTO) 20 MG TABS tablet Take 1 tablet (20 mg total) by mouth daily with supper. 04/21/23   Jake Bathe, MD    Current Outpatient Medications  Medication Sig Dispense Refill   atorvastatin (LIPITOR) 10 MG tablet Take 1 tablet (10 mg total) by mouth daily. 90 tablet 2   metoprolol succinate (TOPROL-XL) 25 MG 24 hr tablet Take 1 tablet (25 mg total) by mouth daily. 90 tablet 3   potassium chloride (KLOR-CON M) 10 MEQ tablet Take 1 tablet (10 mEq total) by mouth daily.     acetaminophen  (TYLENOL) 325 MG tablet Take 2 tablets (650 mg total) by mouth every 6 (six) hours as needed for mild pain, moderate pain, fever or headache.     furosemide (LASIX) 20 MG tablet Take 1 tablet (20 mg total) by mouth daily. (Patient not taking: Reported on 07/03/2023) 90 tablet 2   nitroGLYCERIN (NITROSTAT) 0.4 MG SL tablet DISSOLVE ONE TABLET UNDER THE TONGUE EVERY 5 MINUTES AS NEEDED FOR CHEST PAIN.  DO NOT EXCEED A TOTAL OF 3 DOSES IN 15 MINUTES (Patient not taking: No sig reported) 75 tablet 0   rivaroxaban (XARELTO) 20 MG TABS tablet Take 1 tablet (20 mg total) by mouth daily with supper. 90 tablet 1   Current Facility-Administered Medications  Medication Dose Route Frequency Provider Last Rate Last Admin   0.9 %  sodium chloride infusion  500 mL Intravenous Continuous Saron Tweed V, DO        Allergies as of 07/11/2023   (No Known Allergies)    Family History  Problem Relation Age of Onset   COPD Mother        from long-term smoking   Colon polyps Father    Colon cancer Father    Heart disease Father  Heart attack Father    Hypertension Father    Thyroid cancer Father    Heart disease Brother    Congenital heart disease Son         transposition of great vessels- corrected with  MI  as an adult   Stroke Neg Hx    Esophageal cancer Neg Hx    Rectal cancer Neg Hx    Stomach cancer Neg Hx     Social History   Socioeconomic History   Marital status: Married    Spouse name: Not on file   Number of children: Not on file   Years of education: Not on file   Highest education level: Not on file  Occupational History   Occupation: Lobbyist: [employer]  Tobacco Use   Smoking status: Never   Smokeless tobacco: Never  Vaping Use   Vaping status: Never Used  Substance and Sexual Activity   Alcohol use: Yes    Alcohol/week: 0.0 standard drinks of alcohol    Comment: RARE   Drug use: No   Sexual activity: Never  Other Topics Concern   Not on file   Social History Narrative   High School grad.   Married 40+ years   Conservation officer, nature at [employer]   Social Drivers of Health   Financial Resource Strain: Low Risk  (11/07/2022)   Overall Financial Resource Strain (CARDIA)    Difficulty of Paying Living Expenses: Not hard at all  Food Insecurity: No Food Insecurity (11/07/2022)   Hunger Vital Sign    Worried About Running Out of Food in the Last Year: Never true    Ran Out of Food in the Last Year: Never true  Transportation Needs: No Transportation Needs (07/09/2022)   PRAPARE - Administrator, Civil Service (Medical): No    Lack of Transportation (Non-Medical): No  Physical Activity: Inactive (11/07/2022)   Exercise Vital Sign    Days of Exercise per Week: 0 days    Minutes of Exercise per Session: 0 min  Stress: No Stress Concern Present (11/07/2022)   [employer] of Occupational Health - Occupational Stress Questionnaire    Feeling of Stress : Not at all  Social Connections: Moderately Isolated (11/07/2022)   Social Connection and Isolation Panel [NHANES]    Frequency of Communication with Friends and Family: More than three times a week    Frequency of Social Gatherings with Friends and Family: More than three times a week    Attends Religious Services: Never    Database administrator or Organizations: No    Attends Banker Meetings: Never    Marital Status: Married  Catering manager Violence: Not At Risk (11/07/2022)   Humiliation, Afraid, Rape, and Kick questionnaire    Fear of Current or Ex-Partner: No    Emotionally Abused: No    Physically Abused: No    Sexually Abused: No    Physical Exam: Vital signs in last 24 hours: @BP  121/67   Pulse 99   Temp (!) 97.4 F (36.3 C)   Ht 5\' 2"  (1.575 m)   Wt 200 lb (90.7 kg)   SpO2 99%   BMI 36.58 kg/m  GEN: NAD EYE: Sclerae anicteric ENT: MMM CV: Non-tachycardic Pulm: CTA b/l GI: Soft, NT/ND NEURO:  Alert & Oriented x 3   Doristine Locks,  DO [facility]   07/11/2023 10:06 AM

## 2023-07-11 NOTE — Op Note (Signed)
[facility] Patient Name: Natasha Dickson Procedure Date: 07/11/2023 10:08 AM MRN: [id number] Endoscopist: Doristine Locks , MD, [id number] Age: 78 Referring MD:  Date of Birth: 29-Jun-1945 Gender: Female Account #: [id number] Procedure:                Colonoscopy Indications:              High risk colon cancer surveillance: Personal                            history of colon cancer                           History of Stage II (T3N0M0) Adenocarcinoma of the                            Colon diagnosed 05/29/2022 via colonoscopy with                            large cecal mass, now s/p ive laparoscopic right                            hemicolectomy 06/04/2022. Presents today for                            ongoing surveillance. No recent GI symptoms. Medicines:                Monitored Anesthesia Care Procedure:                Pre-Anesthesia Assessment:                           - Prior to the procedure, a History and Physical                            was performed, and patient medications and                            allergies were reviewed. The patient's tolerance of                            previous anesthesia was also reviewed. The risks                            and benefits of the procedure and the sedation                            options and risks were discussed with the patient.                            All questions were answered, and informed consent                            was obtained. Prior Anticoagulants: The patient has  taken Xarelto (rivaroxaban), last dose was 2 days                            prior to procedure. ASA Grade Assessment: III - A                            patient with severe systemic disease. After                            reviewing the risks and benefits, the patient was                            deemed in satisfactory condition to undergo the                            procedure.                            After obtaining informed consent, the colonoscope                            was passed under direct vision. Throughout the                            procedure, the patient's blood pressure, pulse, and                            oxygen saturations were monitored continuously. The                            Olympus Scope SN: [id number] was introduced through                            the anus and advanced to the the ileocolonic                            anastomosis. The colonoscopy was performed without                            difficulty. The patient tolerated the procedure                            well. The quality of the bowel preparation was                            good. The rectum, Ileocolonic anastomosis,                            neoterminal ileum were photographed. Scope In: 10:20:39 AM Scope Out: 10:33:36 AM Scope Withdrawal Time: 0 hours 11 minutes 23 seconds  Total Procedure Duration: 0 hours 12 minutes 57 seconds  Findings:                 The perianal and digital rectal examinations were  normal.                           A 3 mm polyp was found in the transverse colon. The                            polyp was sessile. The polyp was removed with a                            cold snare. Resection and retrieval were complete.                            Estimated blood loss was minimal.                           An 8 mm polyp was found in the sigmoid colon. The                            polyp was sessile. The polyp was removed with a                            cold snare. Resection and retrieval were complete.                            Estimated blood loss was minimal.                           There was evidence of a prior end-to-side                            ileo-colonic anastomosis in the transverse colon.                            This was patent and was characterized by healthy                            appearing mucosa. The  anastomosis was traversed.                           Multiple large-mouthed and small-mouthed                            diverticula were found in the entire examined colon.                           The retroflexed view of the distal rectum and anal                            verge was normal and showed no anal or rectal                            abnormalities.  The neo-terminal ileum appeared normal. Complications:            No immediate complications. Estimated Blood Loss:     Estimated blood loss was minimal. Impression:               - One 3 mm polyp in the transverse colon, removed                            with a cold snare. Resected and retrieved.                           - One 8 mm polyp in the sigmoid colon, removed with                            a cold snare. Resected and retrieved.                           - Patent end-to-side ileo-colonic anastomosis,                            characterized by healthy appearing mucosa.                           - Diverticulosis in the entire examined colon.                           - The distal rectum and anal verge are normal on                            retroflexion view.                           - The examined portion of the ileum was normal. Recommendation:           - Patient has a contact number available for                            emergencies. The signs and symptoms of potential                            delayed complications were discussed with the                            patient. Return to normal activities tomorrow.                            Written discharge instructions were provided to the                            patient.                           - Resume previous diet.                           - Continue present medications.                           -  Await pathology results.                           - Resume Xarelto (rivaroxaban) at prior dose                             tomorrow.                           - Repeat colonoscopy for surveillance based on                            pathology results. Doristine Locks, MD 07/11/2023 10:43:49 AM

## 2023-07-11 NOTE — Progress Notes (Signed)
 Vss nad trans to pacu

## 2023-07-11 NOTE — Patient Instructions (Signed)
-   Resume previous diet. - Continue present medications. - Await pathology results. - Resume Xarelto (rivaroxaban) at prior dose tomorrow. (07/12/2023)    YOU HAD AN ENDOSCOPIC PROCEDURE TODAY AT THE [facility]:   Refer to the procedure report that was given to you for any specific questions about what was found during the examination.  If the procedure report does not answer your questions, please call your gastroenterologist to clarify.  If you requested that your care partner not be given the details of your procedure findings, then the procedure report has been included in a sealed envelope for you to review at your convenience later.  YOU SHOULD EXPECT: Some feelings of bloating in the abdomen. Passage of more gas than usual.  Walking can help get rid of the air that was put into your GI tract during the procedure and reduce the bloating. If you had a lower endoscopy (such as a colonoscopy or flexible sigmoidoscopy) you may notice spotting of blood in your stool or on the toilet paper. If you underwent a bowel prep for your procedure, you may not have a normal bowel movement for a few days.  Please Note:  You might notice some irritation and congestion in your nose or some drainage.  This is from the oxygen used during your procedure.  There is no need for concern and it should clear up in a day or so.  SYMPTOMS TO REPORT IMMEDIATELY:  Following lower endoscopy (colonoscopy or flexible sigmoidoscopy):  Excessive amounts of blood in the stool  Significant tenderness or worsening of abdominal pains  Swelling of the abdomen that is new, acute  Fever of 100F or higher  For urgent or emergent issues, a gastroenterologist can be reached at any hour by calling [phone]. Do not use MyChart messaging for urgent concerns.    DIET:  We do recommend a small meal at first, but then you may proceed to your regular diet.  Drink plenty of fluids but you should avoid alcoholic beverages  for 24 hours.  ACTIVITY:  You should plan to take it easy for the rest of today and you should NOT DRIVE or use heavy machinery until tomorrow (because of the sedation medicines used during the test).    FOLLOW UP: Our staff will call the number listed on your records the next business day following your procedure.  We will call around 7:15- 8:00 am to check on you and address any questions or concerns that you may have regarding the information given to you following your procedure. If we do not reach you, we will leave a message.     If any biopsies were taken you will be contacted by phone or by letter within the next 1-3 weeks.  Please call us at [phone] if you have not heard about the biopsies in 3 weeks.    SIGNATURES/CONFIDENTIALITY: You and/or your care partner have signed paperwork which will be entered into your electronic medical record.  These signatures attest to the fact that that the information above on your After Visit Summary has been reviewed and is understood.  Full responsibility of the confidentiality of this discharge information lies with you and/or your care-partner.

## 2023-07-11 NOTE — Progress Notes (Signed)
 Called to room to assist during endoscopic procedure.  Patient ID and intended procedure confirmed with present staff. Received instructions for my participation in the procedure from the performing physician.

## 2023-07-14 ENCOUNTER — Telehealth: Payer: Self-pay

## 2023-07-14 NOTE — Telephone Encounter (Signed)
 Unable to speak with pt.  Left HIPAA compliant voicemail.

## 2023-07-15 LAB — SURGICAL PATHOLOGY

## 2023-07-28 ENCOUNTER — Encounter: Payer: Self-pay | Admitting: Gastroenterology

## 2023-08-08 ENCOUNTER — Other Ambulatory Visit: Payer: Self-pay

## 2023-08-08 DIAGNOSIS — C189 Malignant neoplasm of colon, unspecified: Secondary | ICD-10-CM

## 2023-08-08 DIAGNOSIS — Z7901 Long term (current) use of anticoagulants: Secondary | ICD-10-CM

## 2023-08-08 DIAGNOSIS — N182 Chronic kidney disease, stage 2 (mild): Secondary | ICD-10-CM

## 2023-08-08 DIAGNOSIS — Z79899 Other long term (current) drug therapy: Secondary | ICD-10-CM

## 2023-08-08 DIAGNOSIS — E876 Hypokalemia: Secondary | ICD-10-CM

## 2023-08-08 DIAGNOSIS — D500 Iron deficiency anemia secondary to blood loss (chronic): Secondary | ICD-10-CM

## 2023-08-08 DIAGNOSIS — I5042 Chronic combined systolic (congestive) and diastolic (congestive) heart failure: Secondary | ICD-10-CM

## 2023-08-08 DIAGNOSIS — I82A11 Acute embolism and thrombosis of right axillary vein: Secondary | ICD-10-CM

## 2023-08-08 DIAGNOSIS — I10 Essential (primary) hypertension: Secondary | ICD-10-CM

## 2023-08-08 DIAGNOSIS — I4821 Permanent atrial fibrillation: Secondary | ICD-10-CM

## 2023-08-08 DIAGNOSIS — I4891 Unspecified atrial fibrillation: Secondary | ICD-10-CM

## 2023-08-08 DIAGNOSIS — E782 Mixed hyperlipidemia: Secondary | ICD-10-CM

## 2023-08-08 MED ORDER — FUROSEMIDE 20 MG PO TABS
20.0000 mg | ORAL_TABLET | Freq: Every day | ORAL | 2 refills | Status: DC
Start: 2023-08-08 — End: 2023-08-22

## 2023-08-19 ENCOUNTER — Telehealth: Payer: Self-pay

## 2023-08-19 NOTE — Telephone Encounter (Signed)
 Copied from CRM [id number]. Topic: Clinical - Medical Advice >> Aug 19, 2023 12:58 PM Sonny Dandy B wrote: Reason for CRM: pt called to speak with provider regarding her mom. States mom is not eating, or drinking., also has pain in her middle, lower back pain. Believes the pt  has dementia. Please call pt back at [phone]

## 2023-08-20 NOTE — Telephone Encounter (Signed)
 Left message for patients daughter Consuella Lose to return call.

## 2023-08-20 NOTE — Telephone Encounter (Signed)
 There is no clear way to know what to do without evaluating the patient first.  She needs a visit here when possible, ie scheduled Friday.  Thanks.

## 2023-08-20 NOTE — Telephone Encounter (Signed)
 Patients daughter is returning call to me to clarify what is going on with her mom. Per Consuella Lose the patient has been have lower back pain for the past two weeks and although she is taking Flexeril and tylenol it seems to help but not completely. Her mom will barely get out the recliner and will not sleep in her bed. She only leaves the recliner to fix her something to eat on or go to the restroom, which is not often. She will eat and drink some but not much because she does not want to get up and go to the bathroom because it painful to move. She is concern about her mom and the pain that she is in. She is wondering if she needs an xray or something to find out what could be the underlying issue. She can not come tomorrow for an xray but on Friday she can come possibly before her an appointment to get an xray if need be. Please advise.

## 2023-08-21 NOTE — Telephone Encounter (Signed)
 Unable to reach patients daughter. Left voicemail to return call to our office.

## 2023-08-22 ENCOUNTER — Ambulatory Visit
Admission: RE | Admit: 2023-08-22 | Discharge: 2023-08-22 | Disposition: A | Discharge status: Home / Self Care | Source: Ambulatory Visit | Attending: Family Medicine | Admitting: Family Medicine

## 2023-08-22 ENCOUNTER — Ambulatory Visit: Payer: Medicare Other | Admitting: Family Medicine

## 2023-08-22 VITALS — BP 122/78 | HR 96 | Temp 95.0°F | Ht 62.0 in | Wt 208.2 lb

## 2023-08-22 DIAGNOSIS — C189 Malignant neoplasm of colon, unspecified: Secondary | ICD-10-CM

## 2023-08-22 DIAGNOSIS — M419 Scoliosis, unspecified: Secondary | ICD-10-CM | POA: Diagnosis not present

## 2023-08-22 DIAGNOSIS — M47816 Spondylosis without myelopathy or radiculopathy, lumbar region: Secondary | ICD-10-CM | POA: Diagnosis not present

## 2023-08-22 DIAGNOSIS — E782 Mixed hyperlipidemia: Secondary | ICD-10-CM | POA: Diagnosis not present

## 2023-08-22 DIAGNOSIS — M545 Low back pain, unspecified: Secondary | ICD-10-CM

## 2023-08-22 DIAGNOSIS — I4891 Unspecified atrial fibrillation: Secondary | ICD-10-CM

## 2023-08-22 DIAGNOSIS — M4856XA Collapsed vertebra, not elsewhere classified, lumbar region, initial encounter for fracture: Secondary | ICD-10-CM | POA: Diagnosis not present

## 2023-08-22 DIAGNOSIS — M4316 Spondylolisthesis, lumbar region: Secondary | ICD-10-CM | POA: Diagnosis not present

## 2023-08-22 MED ORDER — FUROSEMIDE 20 MG PO TABS: 20.0000 mg | ORAL_TABLET | Freq: Every day | ORAL | Status: DC | PRN

## 2023-08-22 MED ORDER — POTASSIUM CHLORIDE CRYS ER 10 MEQ PO TBCR: 10.0000 meq | EXTENDED_RELEASE_TABLET | Freq: Every day | ORAL | Status: DC | PRN

## 2023-08-22 MED ORDER — CYCLOBENZAPRINE HCL 10 MG PO TABS
5.0000 mg | ORAL_TABLET | Freq: Three times a day (TID) | ORAL | 1 refills | Status: DC | PRN
Start: 2023-08-22 — End: 2023-12-04

## 2023-08-22 NOTE — Telephone Encounter (Signed)
 Pt has been evaluated in the office 08/22/23.

## 2023-08-22 NOTE — Progress Notes (Signed)
 Had follow up colonoscopy after prev colon cancer eval and tx. discussed.  In wheelchair, using walker at the house.  No falls.    Living at home with husband.  She had "coming and going" memory changes, with some occ word searching.  No acute changes.  Back pain started about 2 weeks ago.  No trauma.  She hasn't been up as much recently.  Then had pain with sweeping her house, then had more pain in the meantime.  No FCNAVD.  No blood in urine or stool.  Pain is not constant but is frequent.  Sometimes more pain than others, more pain with trying to stand.  Some better after standing with her walker.    Lower back pain, starts on the L side, then moves to the R side.  Tight feeling.    Flexeril prev helped.  No ADE on med.   No radiation into the legs.  No burning with urination.    Previous CT images reviewed.  Previous L1 and L3 compression fractures noted.  Meds, vitals, and allergies reviewed.   ROS: Per HPI unless specifically indicated in ROS section   Nad Ncat In wheelchair Neck supple, no LA IRR.  Ctab Abd soft, not ttp Back nontender to palpation of midline.  Back not tender while sitting. Skin well-perfused.  34 minutes were devoted to patient care in this encounter (this includes time spent reviewing the patient's file/history, interviewing and examining the patient, counseling/reviewing plan with patient).

## 2023-08-22 NOTE — Patient Instructions (Addendum)
 Flexeril as needed, sedation caution.  Xray on the way out.  Take care.  Glad to see you.-

## 2023-08-24 ENCOUNTER — Telehealth: Payer: Self-pay | Admitting: Family Medicine

## 2023-08-24 DIAGNOSIS — S32010S Wedge compression fracture of first lumbar vertebra, sequela: Secondary | ICD-10-CM

## 2023-08-24 DIAGNOSIS — M899 Disorder of bone, unspecified: Secondary | ICD-10-CM

## 2023-08-24 DIAGNOSIS — M8088XS Other osteoporosis with current pathological fracture, vertebra(e), sequela: Secondary | ICD-10-CM

## 2023-08-24 NOTE — Telephone Encounter (Signed)
 Notify patient.  Awaiting overread.  She has deformities in L1 and L3 that look similar to prior.  I would continue as planned with Flexeril.  Update me as needed.    Please schedule yearly visit with me later this summer.  Thanks.

## 2023-08-24 NOTE — Assessment & Plan Note (Signed)
 Continue anticoagulation with Xarelto.  No falls.

## 2023-08-24 NOTE — Assessment & Plan Note (Signed)
 History of, see above, per outside clinic.

## 2023-08-24 NOTE — Assessment & Plan Note (Signed)
 She could have a muscle strain.  Flexeril helped.  Can use, with routine cautions.  Check plain films today.  Known history of compression fracture.  See notes on imaging.  At this point still okay for outpatient follow-up.

## 2023-08-26 NOTE — Telephone Encounter (Signed)
 Left voicemail for patient to return call to office.

## 2023-08-28 ENCOUNTER — Other Ambulatory Visit: Payer: Self-pay

## 2023-08-28 DIAGNOSIS — Z7901 Long term (current) use of anticoagulants: Secondary | ICD-10-CM

## 2023-08-28 DIAGNOSIS — D500 Iron deficiency anemia secondary to blood loss (chronic): Secondary | ICD-10-CM

## 2023-08-28 DIAGNOSIS — I4821 Permanent atrial fibrillation: Secondary | ICD-10-CM

## 2023-08-28 DIAGNOSIS — Z79899 Other long term (current) drug therapy: Secondary | ICD-10-CM

## 2023-08-28 DIAGNOSIS — N182 Chronic kidney disease, stage 2 (mild): Secondary | ICD-10-CM

## 2023-08-28 DIAGNOSIS — E782 Mixed hyperlipidemia: Secondary | ICD-10-CM

## 2023-08-28 DIAGNOSIS — E876 Hypokalemia: Secondary | ICD-10-CM

## 2023-08-28 DIAGNOSIS — I4891 Unspecified atrial fibrillation: Secondary | ICD-10-CM

## 2023-08-28 DIAGNOSIS — I82A11 Acute embolism and thrombosis of right axillary vein: Secondary | ICD-10-CM

## 2023-08-28 DIAGNOSIS — C189 Malignant neoplasm of colon, unspecified: Secondary | ICD-10-CM

## 2023-08-28 DIAGNOSIS — I10 Essential (primary) hypertension: Secondary | ICD-10-CM

## 2023-08-28 DIAGNOSIS — I5042 Chronic combined systolic (congestive) and diastolic (congestive) heart failure: Secondary | ICD-10-CM

## 2023-08-28 MED ORDER — ATORVASTATIN CALCIUM 10 MG PO TABS
10.0000 mg | ORAL_TABLET | Freq: Every day | ORAL | 2 refills | Status: DC
Start: 2023-08-28 — End: 2024-04-15

## 2023-08-29 NOTE — Telephone Encounter (Signed)
 Left voicemail for patient to return call to office.

## 2023-09-01 NOTE — Telephone Encounter (Signed)
Left message for patient daughter to return call to office.

## 2023-09-02 ENCOUNTER — Encounter: Payer: Self-pay | Admitting: Family Medicine

## 2023-09-02 NOTE — Telephone Encounter (Signed)
 Several attempts have been made to reach patient. Will send letter

## 2023-09-14 ENCOUNTER — Encounter: Payer: Self-pay | Admitting: Family Medicine

## 2023-09-24 ENCOUNTER — Other Ambulatory Visit: Payer: Self-pay | Admitting: Cardiology

## 2023-09-24 DIAGNOSIS — I82A11 Acute embolism and thrombosis of right axillary vein: Secondary | ICD-10-CM

## 2023-09-24 DIAGNOSIS — I4821 Permanent atrial fibrillation: Secondary | ICD-10-CM

## 2023-09-24 DIAGNOSIS — N182 Chronic kidney disease, stage 2 (mild): Secondary | ICD-10-CM

## 2023-09-24 DIAGNOSIS — E876 Hypokalemia: Secondary | ICD-10-CM

## 2023-09-24 DIAGNOSIS — I4891 Unspecified atrial fibrillation: Secondary | ICD-10-CM

## 2023-09-24 DIAGNOSIS — E782 Mixed hyperlipidemia: Secondary | ICD-10-CM

## 2023-09-24 DIAGNOSIS — Z7901 Long term (current) use of anticoagulants: Secondary | ICD-10-CM

## 2023-09-24 DIAGNOSIS — Z79899 Other long term (current) drug therapy: Secondary | ICD-10-CM

## 2023-09-24 DIAGNOSIS — I10 Essential (primary) hypertension: Secondary | ICD-10-CM

## 2023-09-24 DIAGNOSIS — D500 Iron deficiency anemia secondary to blood loss (chronic): Secondary | ICD-10-CM

## 2023-09-24 DIAGNOSIS — C189 Malignant neoplasm of colon, unspecified: Secondary | ICD-10-CM

## 2023-09-24 DIAGNOSIS — I5042 Chronic combined systolic (congestive) and diastolic (congestive) heart failure: Secondary | ICD-10-CM

## 2023-09-25 NOTE — Telephone Encounter (Signed)
 Prescription refill request for Xarelto received.  Indication: Afib  Last office visit: 05/06/23 Renna Cary)  Weight: 94.4kg Age: 78 Scr:  0.96 (05/14/23)  CrCl: 73.37ml/min  Appropriate dose. Refill sent.

## 2023-11-27 ENCOUNTER — Other Ambulatory Visit: Payer: Self-pay | Admitting: Hematology and Oncology

## 2023-11-27 DIAGNOSIS — D500 Iron deficiency anemia secondary to blood loss (chronic): Secondary | ICD-10-CM

## 2023-11-27 DIAGNOSIS — C182 Malignant neoplasm of ascending colon: Secondary | ICD-10-CM

## 2023-11-27 NOTE — Progress Notes (Unsigned)
 [facility] [phone]   [phone]  PROGRESS NOTE  Patient Care Team: Donnie Galea, MD as PCP - General (Family Medicine) Sonny Dust, MD (Inactive) as PCP - Cardiology (Cardiology)  Hematological/Oncological History # Stage II (T3N0M0) Adenocarcinoma of the Colon 05/29/2022: colonoscopy performed and showed large cecal mass. Pathology confirmed an invasive moderately differentiated adenocarcinoma. Same day CT scan showed a  4.5 x 2.6 cm asymmetric wall thickening in cecum close to the ileocecal junction.  06/04/2022: laparoscopic right hemicolectomy. Pathology confirms T3N0 adenocarcinoma of the colon.  07/03/2022: establish care with Dr. Rosaline Coma   Interval History:  Natasha Dickson 78 y.o. female with medical history significant for adenocarcinoma of the colon s/p resection who presents for a follow up visit. The patient's last visit was on 05/30/2023. In the interim since the last visit she has had no major changes in her health.  On exam today [name] reports ***  MEDICAL HISTORY:  Past Medical History:  Diagnosis Date   Breast cancer (HCC) 1999   Stage I in left breast   Chronic combined systolic and diastolic CHF (congestive heart failure) (HCC)    a. 02/2013 Echo: EF 30-35%, diff HK, biatrial enlargement. b. 2D echo 07/2014 showed mildly dilated LV, EF 45-50%, severely dilated left atrium and moderately dilated right atrium.   CKD (chronic kidney disease), stage II    Closed fracture of unspecified part of neck of femur 2011   Right.  Soreness when standing. (Dr. Aviva Lemmings)   Colon cancer [facility]) 2023   s/p 05/2022 removed colon cancer   Nonischemic cardiomyopathy (HCC)    a. 02/2013 [facility]: No ischemia, prob attenuation vs scar, EF 40%-->Med Rx.   Persistent atrial fibrillation (HCC)    a. 02/2013: s/p TEE and attempted cardioversion x 2-->failed-->rate controlled with bb/digoxin , Xarelto  initiated.    SURGICAL HISTORY: Past  Surgical History:  Procedure Laterality Date   BIOPSY  05/29/2022   Procedure: BIOPSY;  Surgeon: Annis Kinder, DO;  Location: [facility] ENDOSCOPY;  Service: Gastroenterology;;   CARDIOVERSION N/A 02/18/2013   Procedure: CARDIOVERSION;  Surgeon: Mardell Shade, MD;  Location: [facility] ENDOSCOPY;  Service: Cardiovascular;  Laterality: N/A;   CARDIOVERSION N/A 02/22/2013   Procedure: CARDIOVERSION;  Surgeon: Elmyra Haggard, MD;  Location: [facility] ENDOSCOPY;  Service: Cardiovascular;  Laterality: N/A;   CHOLECYSTECTOMY Right 06/04/2022   Procedure: LAPAROSCOPIC HAND ASSISTED RIGHT COLECTOMY;  Surgeon: Junie Olds, MD;  Location: [facility] OR;  Service: General;  Laterality: Right;   COLONOSCOPY WITH PROPOFOL  N/A 05/29/2022   Procedure: COLONOSCOPY WITH PROPOFOL ;  Surgeon: Annis Kinder, DO;  Location: [facility] ENDOSCOPY;  Service: Gastroenterology;  Laterality: N/A;   ESOPHAGOGASTRODUODENOSCOPY (EGD) WITH PROPOFOL  N/A 05/29/2022   Procedure: ESOPHAGOGASTRODUODENOSCOPY (EGD) WITH PROPOFOL ;  Surgeon: Annis Kinder, DO;  Location: [facility] ENDOSCOPY;  Service: Gastroenterology;  Laterality: N/A;   IR FLUORO GUIDE CV LINE RIGHT  06/02/2022   IR US  GUIDE VASC ACCESS RIGHT  06/02/2022   MASTECTOMY  06/10/1997   Left breast with saline implant.   PARTIAL HIP ARTHROPLASTY Right    POLYPECTOMY  05/29/2022   Procedure: POLYPECTOMY;  Surgeon: Annis Kinder, DO;  Location: [facility] ENDOSCOPY;  Service: Gastroenterology;;   SUBMUCOSAL TATTOO INJECTION  05/29/2022   Procedure: SUBMUCOSAL TATTOO INJECTION;  Surgeon: Annis Kinder, DO;  Location: [facility] ENDOSCOPY;  Service: Gastroenterology;;   TEE WITHOUT CARDIOVERSION N/A 02/18/2013   Procedure: TRANSESOPHAGEAL ECHOCARDIOGRAM (TEE);  Surgeon: Mardell Shade, MD;  Location: [facility] ENDOSCOPY;  Service: Cardiovascular;  Laterality: N/A;   TUBAL LIGATION      SOCIAL HISTORY: Social History   Socioeconomic History   Marital status: Married    Spouse name: Not on  file   Number of children: Not on file   Years of education: Not on file   Highest education level: Not on file  Occupational History   Occupation: Conservation officer, nature    Employer: [employer]  Tobacco Use   Smoking status: Never   Smokeless tobacco: Never  Vaping Use   Vaping status: Never Used  Substance and Sexual Activity   Alcohol use: Yes    Alcohol/week: 0.0 standard drinks of alcohol    Comment: RARE   Drug use: No   Sexual activity: Never  Other Topics Concern   Not on file  Social History Narrative   High School grad.   Married 40+ years   Conservation officer, nature at [employer]   Social Drivers of Health   Financial Resource Strain: Low Risk  (11/07/2022)   Overall Financial Resource Strain (CARDIA)    Difficulty of Paying Living Expenses: Not hard at all  Food Insecurity: No Food Insecurity (11/07/2022)   Hunger Vital Sign    Worried About Running Out of Food in the Last Year: Never true    Ran Out of Food in the Last Year: Never true  Transportation Needs: No Transportation Needs (07/09/2022)   PRAPARE - Administrator, Civil Service (Medical): No    Lack of Transportation (Non-Medical): No  Physical Activity: Inactive (11/07/2022)   Exercise Vital Sign    Days of Exercise per Week: 0 days    Minutes of Exercise per Session: 0 min  Stress: No Stress Concern Present (11/07/2022)   [employer] of Occupational Health - Occupational Stress Questionnaire    Feeling of Stress : Not at all  Social Connections: Moderately Isolated (11/07/2022)   Social Connection and Isolation Panel    Frequency of Communication with Friends and Family: More than three times a week    Frequency of Social Gatherings with Friends and Family: More than three times a week    Attends Religious Services: Never    Database administrator or Organizations: No    Attends Banker Meetings: Never    Marital Status: Married  Catering manager Violence: Not At Risk (11/07/2022)    Humiliation, Afraid, Rape, and Kick questionnaire    Fear of Current or Ex-Partner: No    Emotionally Abused: No    Physically Abused: No    Sexually Abused: No    FAMILY HISTORY: Family History  Problem Relation Age of Onset   COPD Mother        from long-term smoking   Colon polyps Father    Colon cancer Father    Heart disease Father    Heart attack Father    Hypertension Father    Thyroid  cancer Father    Heart disease Brother    Congenital heart disease Son         transposition of great vessels- corrected with  MI  as an adult   Stroke Neg Hx    Esophageal cancer Neg Hx    Rectal cancer Neg Hx    Stomach cancer Neg Hx     ALLERGIES:  has no known allergies.  MEDICATIONS:  Current Outpatient Medications  Medication Sig Dispense Refill   acetaminophen  (TYLENOL ) 325 MG tablet Take 2 tablets (650 mg total) by mouth every 6 (six) hours  as needed for mild pain, moderate pain, fever or headache.     atorvastatin  (LIPITOR) 10 MG tablet Take 1 tablet (10 mg total) by mouth daily. 90 tablet 2   cyclobenzaprine  (FLEXERIL ) 10 MG tablet Take 0.5-1 tablets (5-10 mg total) by mouth 3 (three) times daily as needed for muscle spasms (sedation caution). 30 tablet 1   furosemide  (LASIX ) 20 MG tablet Take 1 tablet (20 mg total) by mouth daily as needed.     metoprolol  succinate (TOPROL -XL) 25 MG 24 hr tablet Take 1 tablet (25 mg total) by mouth daily. 90 tablet 3   nitroGLYCERIN  (NITROSTAT ) 0.4 MG SL tablet DISSOLVE ONE TABLET UNDER THE TONGUE EVERY 5 MINUTES AS NEEDED FOR CHEST PAIN.  DO NOT EXCEED A TOTAL OF 3 DOSES IN 15 MINUTES 75 tablet 0   potassium chloride  (KLOR-CON  M) 10 MEQ tablet Take 1 tablet (10 mEq total) by mouth daily as needed (when taking lasix ).     rivaroxaban  (XARELTO ) 20 MG TABS tablet TAKE 1 TABLET BY MOUTH DAILY  WITH SUPPER 90 tablet 1   No current facility-administered medications for this visit.    REVIEW OF SYSTEMS:   Constitutional: ( - ) fevers, ( - )   chills , ( - ) night sweats Eyes: ( - ) blurriness of vision, ( - ) double vision, ( - ) watery eyes Ears, nose, mouth, throat, and face: ( - ) mucositis, ( - ) sore throat Respiratory: ( - ) cough, ( - ) dyspnea, ( - ) wheezes Cardiovascular: ( - ) palpitation, ( - ) chest discomfort, ( - ) lower extremity swelling Gastrointestinal:  ( - ) nausea, ( - ) heartburn, ( - ) change in bowel habits Skin: ( - ) abnormal skin rashes Lymphatics: ( - ) new lymphadenopathy, ( - ) easy bruising Neurological: ( - ) numbness, ( - ) tingling, ( - ) new weaknesses Behavioral/Psych: ( - ) mood change, ( - ) new changes  All other systems were reviewed with the patient and are negative.  PHYSICAL EXAMINATION:  There were no vitals filed for this visit.   There were no vitals filed for this visit.    GENERAL: Well-appearing elderly Caucasian female, alert, no distress and comfortable SKIN: skin color, texture, turgor are normal, no rashes or significant lesions EYES: conjunctiva are pink and non-injected, sclera clear LUNGS: clear to auscultation and percussion with normal breathing effort HEART: regular rate & rhythm and no murmurs and no lower extremity edema Musculoskeletal: no cyanosis of digits and no clubbing  PSYCH: alert & oriented x 3, fluent speech NEURO: no focal motor/sensory deficits  LABORATORY DATA:  I have reviewed the data as listed    Latest Ref Rng & Units 05/30/2023   11:29 AM 11/29/2022   12:43 PM 11/05/2022    9:33 AM  CBC  WBC 4.0 - 10.5 K/uL 6.3  6.6  7.2   Hemoglobin 12.0 - 15.0 g/dL 46.9  62.9  52.8   Hematocrit 36.0 - 46.0 % 47.5  43.3  43.2   Platelets 150 - 400 K/uL 194  261  271        Latest Ref Rng & Units 05/30/2023   11:29 AM 11/29/2022   12:43 PM 11/05/2022    9:33 AM  CMP  Glucose 70 - 99 mg/dL 93  83  413   BUN 8 - 23 mg/dL 12  11  13    Creatinine 0.44 - 1.00 mg/dL 2.44  0.10  2.72  Sodium 135 - 145 mmol/L 139  138  141   Potassium 3.5 - 5.1  mmol/L 4.3  4.3  3.7   Chloride 98 - 111 mmol/L 109  106  103   CO2 22 - 32 mmol/L 23  26  22    Calcium  8.9 - 10.3 mg/dL 9.2  9.8  9.3   Total Protein 6.5 - 8.1 g/dL 7.1  7.2    Total Bilirubin <1.2 mg/dL 0.9  1.2    Alkaline Phos 38 - 126 U/L 106  89    AST 15 - 41 U/L 15  16    ALT 0 - 44 U/L 7  10      RADIOGRAPHIC STUDIES: No results found.   ASSESSMENT & PLAN Natasha Dickson 78 y.o. female with medical history significant for adenocarcinoma of the colon s/p resection who presents for a follow up visit.   # Stage II (T3N0M0) Adenocarcinoma of the Colon -- Pathology results status post resection are consistent with a stage II adenocarcinoma the colon.  Findings appear low risk at this time.  No indication for adjuvant chemotherapy. --Plan for CT scans every 12 months for the next 5 years. (Next due in Dec 2025). Plan for return to clinic every 6 months time for repeat labs and evaluation. --Labs today show white blood cell count *** --Patient will require colonoscopy at the 1 year mark after surgery.  That will be due in December 2024.  Will reach out to GI to see if this can be performed in the near future. --RTC for 6 months for labs and clinic visit   No orders of the defined types were placed in this encounter.   All questions were answered. The patient knows to call the clinic with any problems, questions or concerns.  A total of more than 30 minutes were spent on this encounter with face-to-face time and non-face-to-face time, including preparing to see the patient, ordering tests and/or medications, counseling the patient and coordination of care as outlined above.   Rogerio Clay, MD Department of Hematology/Oncology [facility] [place] Phone: [phone] Pager: [phone] Email: [email or web address]  11/27/2023 10:48 PM

## 2023-11-28 ENCOUNTER — Inpatient Hospital Stay (HOSPITAL_BASED_OUTPATIENT_CLINIC_OR_DEPARTMENT_OTHER): Payer: Medicare Other | Admitting: Hematology and Oncology

## 2023-11-28 ENCOUNTER — Telehealth: Payer: Self-pay | Admitting: Cardiology

## 2023-11-28 ENCOUNTER — Inpatient Hospital Stay: Payer: Medicare Other | Attending: Hematology and Oncology

## 2023-11-28 VITALS — BP 131/80 | HR 84 | Temp 97.3°F | Resp 20 | Wt 214.6 lb

## 2023-11-28 DIAGNOSIS — C182 Malignant neoplasm of ascending colon: Secondary | ICD-10-CM

## 2023-11-28 DIAGNOSIS — Z08 Encounter for follow-up examination after completed treatment for malignant neoplasm: Secondary | ICD-10-CM | POA: Diagnosis not present

## 2023-11-28 DIAGNOSIS — D500 Iron deficiency anemia secondary to blood loss (chronic): Secondary | ICD-10-CM | POA: Diagnosis not present

## 2023-11-28 DIAGNOSIS — Z85038 Personal history of other malignant neoplasm of large intestine: Secondary | ICD-10-CM | POA: Diagnosis not present

## 2023-11-28 LAB — CBC WITH DIFFERENTIAL (CANCER CENTER ONLY)
Abs Immature Granulocytes: 0.07 10*3/uL (ref 0.00–0.07)
Basophils Absolute: 0 10*3/uL (ref 0.0–0.1)
Basophils Relative: 1 %
Eosinophils Absolute: 0.2 10*3/uL (ref 0.0–0.5)
Eosinophils Relative: 3 %
HCT: 43.6 % (ref 36.0–46.0)
Hemoglobin: 14.5 g/dL (ref 12.0–15.0)
Immature Granulocytes: 1 %
Lymphocytes Relative: 26 %
Lymphs Abs: 1.5 10*3/uL (ref 0.7–4.0)
MCH: 30.7 pg (ref 26.0–34.0)
MCHC: 33.3 g/dL (ref 30.0–36.0)
MCV: 92.4 fL (ref 80.0–100.0)
Monocytes Absolute: 0.5 10*3/uL (ref 0.1–1.0)
Monocytes Relative: 8 %
Neutro Abs: 3.5 10*3/uL (ref 1.7–7.7)
Neutrophils Relative %: 61 %
Platelet Count: 283 10*3/uL (ref 150–400)
RBC: 4.72 MIL/uL (ref 3.87–5.11)
RDW: 14.1 % (ref 11.5–15.5)
WBC Count: 5.7 10*3/uL (ref 4.0–10.5)
nRBC: 0 % (ref 0.0–0.2)

## 2023-11-28 LAB — CMP (CANCER CENTER ONLY)
ALT: 10 U/L (ref 0–44)
AST: 15 U/L (ref 15–41)
Albumin: 3.9 g/dL (ref 3.5–5.0)
Alkaline Phosphatase: 117 U/L (ref 38–126)
Anion gap: 8 (ref 5–15)
BUN: 18 mg/dL (ref 8–23)
CO2: 24 mmol/L (ref 22–32)
Calcium: 8.8 mg/dL — ABNORMAL LOW (ref 8.9–10.3)
Chloride: 105 mmol/L (ref 98–111)
Creatinine: 0.98 mg/dL (ref 0.44–1.00)
GFR, Estimated: 59 mL/min — ABNORMAL LOW (ref >60)
Glucose, Bld: 99 mg/dL (ref 70–99)
Potassium: 4.5 mmol/L (ref 3.5–5.1)
Sodium: 137 mmol/L (ref 135–145)
Total Bilirubin: 0.7 mg/dL (ref 0.0–1.2)
Total Protein: 7.4 g/dL (ref 6.5–8.1)

## 2023-11-28 NOTE — Telephone Encounter (Signed)
 Pt c/o swelling: STAT is pt has developed SOB within 24 hours  How much weight have you gained and in what time span?  About 13-14 lbs since November  If swelling, where is the swelling located?  Legs   Are you currently taking a fluid pill?  Lasix , but daughter says she doesn't want to take it  Are you currently SOB?  Not currently with patient   Do you have a log of your daily weights (if so, list)?  214.5 lbs currently  Have you gained 3 pounds in a day or 5 pounds in a week?   Have you traveled recently?   No

## 2023-11-28 NOTE — Telephone Encounter (Signed)
 EC took pt to the Oncologist today and legs are very swollen. Her weight has gone from 202 in November; now it is 214.7.   Pt has not been taking lasix  and potassium- She does not want to take it because then she has to get up and pee a lot and she does not want to get up and do anything. She gets minimal activity- pt does not want to do anything at all, but is swollen and gained weight.   Appt made with APP 12/15/23 at 10:55 (had earlier appt's but had to work around [facility] schedule). Asked them to start keeping a weight log- daily. Given ER precautions, She verbalized understanding.

## 2023-12-04 ENCOUNTER — Other Ambulatory Visit: Payer: Self-pay | Admitting: *Deleted

## 2023-12-14 NOTE — Progress Notes (Unsigned)
 Cardiology Office Note    Date:  12/15/2023  ID:  Natasha Dickson, DOB [birth year], MRN [id number] PCP:  Cleatus Arlyss RAMAN, MD  Cardiologist:  Oneil Parchment, MD  Electrophysiologist:  None   Chief Complaint: Lower extremity edema   History of Present Illness: .    Natasha Dickson is a 78 y.o. female with visit-pertinent history of combined systolic and diastolic heart failure diagnosed in 2014 thought to be tachycardia induced CM in the setting of A-fib with RVR, hypertension, hyperlipidemia and CKD.  Patient was previously followed by Dr. Maranda and Dr. Hobart, now followed by Dr. Parchment.  Patient with known history of A-fib with RVR with TTE showing LVEF 30 to 35% with diffuse hypokinesis and stress test negative for ischemia.  Cardiomyopathy was believed to be secondary to tachycardia associated with atrial fibrillation.  LVEF improved to normal 50-55 in 2015 but then decreased to 45 to 50% in 2016.  Patient was admitted from 05/27/2022 - 06/12/2021 after presented with generalized weakness found to have new anemia and A-fib with RVR.  Patient was taken for EGD/colonoscopy which showed a malignant completely obstructing tumor in the cecum and ileocecal valve consistent with invasive adenocarcinoma.  CT abdomen/pelvis showed mesenteric LNs, no liver lesions.  Ultimately underwent colectomy on 12/26.  Patient was started on amiodarone  and metoprolol  for her A-fib.  She developed a DVT in the right brachial vein as well as SVT for which patient was started on Xarelto .  Patient was seen in clinic on 11/05/2022 by Dr. Hobart and was doing okay at that time.  Continued to have chronic dyspnea on exertion also endorsed weakness with ambulation.  Was noted she was sedentary at baseline.  Patient was seen by Dr. Parchment on 05/06/2023.  He noted that she been doing fairly well since surgery and was being followed by oncology.  There was some discussion about memory impairment.  Patient reported that she had  not been taking metoprolol  which was prescribed for the atrial fibrillation, she was also prescribed Lasix  which was to be taken as needed but had been avoiding due to its diuretic effect.  It was noted the patient has been managing her diet particular salt intake to help control fluid retention.  It was noted that she had been significantly adherent with medications leading to tachycardia.  Patient was educated on medication adherence.  Today patient presents with her daughter for follow-up.  They note that their biggest concern is ongoing weakness, that she struggles to move from a seated position to standing.  They report that she has been overall sedentary, only getting up during the day to go to the bathroom and occasionally walk around the house. Patient reports that she simply does not have a desire to get up and move around, reports that when she does she denies any chest pain or shortness of breath.  Patient denies any significant increased lower extremity edema, notes that her abdomen may be slightly more swollen than normal however does not feel it significantly so.  Patient's daughter notes that 2 to 3 months ago patient had significant lower back pain resulting in decreased movement, she feels that she is not significantly recovered following this.  Patient reports that she has not taken Lasix , reports that it makes her get up and go to the bathroom frequently.  Patient's daughter does report 1 episode of chest discomfort last week, patient notes that she was sitting watching TV and became increasingly upset as a patient on  the episode had a heart attack, she then had some left-sided chest discomfort that resolved in under 10 minutes.  Patient reports she did not take any nitroglycerin  and has not had any recurrence.  She denies any chest pain, tightness or pressure with exertion.  Patient also notes that with the discomfort she had not taken any of her medications that day.  Labwork independently  reviewed: 11/28/23: Sodium 137, potassium 4.5, creatinine 0.98, AST 15, ALT 10, hemoglobin 14.5, hematocrit 43.6 ROS: .   Today she denies shortness of breath, palpitations, melena, hematuria, hemoptysis, diaphoresis, weakness, presyncope, syncope, orthopnea, and PND.  All other systems are reviewed and otherwise negative. Studies Reviewed: SABRA   EKG:  EKG is ordered today, personally reviewed, demonstrating  EKG Interpretation Date/Time:  Monday December 15 2023 11:08:08 EDT Ventricular Rate:  103 PR Interval:    QRS Duration:  70 QT Interval:  386 QTC Calculation: 505 R Axis:   85  Text Interpretation: Atrial fibrillation with rapid ventricular response Nonspecific T wave abnormality When compared with ECG of 06-May-2023 10:40, No significant change was found Confirmed by Lyndie Vanderloop [phone]) on 12/15/2023 12:16:59 PM   CV Studies: Cardiac studies reviewed are outlined and summarized above. Otherwise please see EMR for full report. Cardiac Studies & Procedures   ______________________________________________________________________________________________   STRESS TESTS  NM MYOCAR MULTI W/SPECT W 02/19/2013  Narrative Identification:  The patient is a 78 year old with history of atrial fibrillation and  shortness of breath.  Test to evaluate rule out ischemia  Stress data:  The patient underwent Lexiscan  stress testing per protocol.  Baseline EKG showed atrial fibrillation 95 beats per minute, T-wave inversion inferolaterally.  Baseline blood pressure 108/71.  With infusion of Lexiscan , EKG showed no significant ST changes to suggest ischemia.  Overall electrically negative.  Nuclear data:  The patient was studied in a 2-day rest/stress protocol.  She was injected with 30 mCi technetium 99 labeled sestamibi at rest, 30 mCi technetium 99 labeled sestamibi at stress.  Images were reconstructed in the short, vertical, horizontal axes.  On review of the raw data, soft tissue  (diaphragm, bowel, breast) surround the heart.  In the initial stress images there is thinning with decreased counts noted in the mid/distal anterior wall.  There is also mild thinning noted in the inferolateral wall (base).  In the recovery images there was no significant change noted from the stress images.  On gating LVEF was calculated at 40% with normal thickening noted in the anterior wall, hypokinesis inferiorly.  Impression: Lexiscan  Sestamibi.  Electrically negative for ischemia.  Sestamibi scan shows anterior thinning consistent with probable soft tissue attenuation(breast) cannot rule-out subendocardial scar.  Basal inferolateral thinning consistent with possible soft tissue attenuation versus subendocardial scar.  No evidence for significant ischemia.   LVEF on gating calculated at 40% with wall motion changes as noted.  Consider echo  to further evaluate systolic function/wall motion.  Overall low risk scan.   Original Report Authenticated By: Vina Gull   ECHOCARDIOGRAM  ECHOCARDIOGRAM COMPLETE 04/16/2022  Narrative ECHOCARDIOGRAM REPORT    Patient Name:   ADORIA KAWAMOTO Date of Exam: 04/16/2022 Medical Rec #:  [id number]     Height:       61.0 in Accession #:    [id number]    Weight:       213.6 lb Date of Birth:  [birth year]     BSA:          1.942 m Patient Age:    62  years      BP:           111/83 mmHg Patient Gender: F             HR:           96 bpm. Exam Location:  [address]  Procedure: 2D Echo, Cardiac Doppler and Color Doppler  Indications:    I50.20 Systolic heart failure  History:        Patient has prior history of Echocardiogram examinations, most recent 08/05/2014. Cardiomyopathy, Breast cancer , CKD, Arrythmias:Tachycardia and Atrial Fibrillation, Signs/Symptoms:Dyspnea; Risk Factors:Hypertension and HLD.  Sonographer:    Waldo Guadalajara RCS Referring Phys: [id number] HEATHER E PEMBERTON  IMPRESSIONS   1. Left ventricular ejection  fraction, by estimation, is 55 to 60%. The left ventricle has normal function. The left ventricle has no regional wall motion abnormalities. Left ventricular diastolic parameters are indeterminate. 2. Right ventricular systolic function is normal. The right ventricular size is normal. There is normal pulmonary artery systolic pressure. 3. Left atrial size was mildly dilated. 4. The mitral valve is normal in structure. Mild mitral valve regurgitation. No evidence of mitral stenosis. 5. Tricuspid valve regurgitation is mild to moderate. 6. The aortic valve is tricuspid. Aortic valve regurgitation is not visualized. Aortic valve sclerosis is present, with no evidence of aortic valve stenosis. 7. The inferior vena cava is normal in size with greater than 50% respiratory variability, suggesting right atrial pressure of 3 mmHg.  FINDINGS Left Ventricle: Left ventricular ejection fraction, by estimation, is 55 to 60%. The left ventricle has normal function. The left ventricle has no regional wall motion abnormalities. Global longitudinal strain performed but not reported based on interpreter judgement due to suboptimal tracking. The left ventricular internal cavity size was normal in size. There is no left ventricular hypertrophy. Left ventricular diastolic parameters are indeterminate.  Right Ventricle: The right ventricular size is normal. No increase in right ventricular wall thickness. Right ventricular systolic function is normal. There is normal pulmonary artery systolic pressure. The tricuspid regurgitant velocity is 2.39 m/s, and with an assumed right atrial pressure of 3 mmHg, the estimated right ventricular systolic pressure is 25.8 mmHg.  Left Atrium: Left atrial size was mildly dilated.  Right Atrium: Right atrial size was normal in size.  Pericardium: There is no evidence of pericardial effusion. Presence of epicardial fat layer.  Mitral Valve: The mitral valve is normal in structure. Mild  mitral valve regurgitation. No evidence of mitral valve stenosis.  Tricuspid Valve: The tricuspid valve is normal in structure. Tricuspid valve regurgitation is mild to moderate. No evidence of tricuspid stenosis.  Aortic Valve: The aortic valve is tricuspid. Aortic valve regurgitation is not visualized. Aortic valve sclerosis is present, with no evidence of aortic valve stenosis.  Pulmonic Valve: The pulmonic valve was not well visualized. Pulmonic valve regurgitation is trivial. No evidence of pulmonic stenosis.  Aorta: The aortic root is normal in size and structure.  Venous: The inferior vena cava is normal in size with greater than 50% respiratory variability, suggesting right atrial pressure of 3 mmHg.  IAS/Shunts: No atrial level shunt detected by color flow Doppler.   LEFT VENTRICLE PLAX 2D LVIDd:         3.40 cm   Diastology LVIDs:         2.40 cm   LV e' medial:    14.30 cm/s LV PW:         0.90 cm   LV E/e' medial:  6.4  LV IVS:        0.80 cm   LV e' lateral:   13.90 cm/s LVOT diam:     2.00 cm   LV E/e' lateral: 6.6 LV SV:         40 LV SV Index:   21 LVOT Area:     3.14 cm   RIGHT VENTRICLE RV Basal diam:  3.60 cm RV S prime:     9.90 cm/s TAPSE (M-mode): 1.6 cm RVSP:           25.8 mmHg  LEFT ATRIUM             Index        RIGHT ATRIUM           Index LA diam:        4.20 cm 2.16 cm/m   RA Pressure: 3.00 mmHg LA Vol (A2C):   73.3 ml 37.74 ml/m  RA Area:     20.30 cm LA Vol (A4C):   81.8 ml 42.11 ml/m  RA Volume:   51.50 ml  26.51 ml/m LA Biplane Vol: 78.8 ml 40.57 ml/m AORTIC VALVE LVOT Vmax:   58.03 cm/s LVOT Vmean:  41.967 cm/s LVOT VTI:    0.128 m  AORTA Ao Root diam: 3.20 cm Ao Asc diam:  2.80 cm  MITRAL VALVE               TRICUSPID VALVE MV Area (PHT):             TR Peak grad:   22.8 mmHg MV Decel Time:             TR Vmax:        239.00 cm/s MV E velocity: 91.20 cm/s  Estimated RAP:  3.00 mmHg RVSP:           25.8  mmHg  SHUNTS Systemic VTI:  0.13 m Systemic Diam: 2.00 cm  Kardie Tobb DO Electronically signed by Dub Huntsman DO Signature Date/Time: 04/16/2022/3:21:23 PM    Final          ______________________________________________________________________________________________       Current Reported Medications:.    Current Meds  Medication Sig   acetaminophen  (TYLENOL ) 325 MG tablet Take 2 tablets (650 mg total) by mouth every 6 (six) hours as needed for mild pain, moderate pain, fever or headache.   atorvastatin  (LIPITOR) 10 MG tablet Take 1 tablet (10 mg total) by mouth daily.   furosemide  (LASIX ) 20 MG tablet Take 20 mg daily for the next 3 days then take as needed for lower extremity Edema, SOB, weight gain of 3 lbs overnight and 5 lbs in a week.   metoprolol  succinate (TOPROL -XL) 25 MG 24 hr tablet Take 1 tablet (25 mg total) by mouth daily.   potassium chloride  (KLOR-CON ) 10 MEQ tablet Take 1 tablet (10 mEq total) by mouth daily.   [DISCONTINUED] nitroGLYCERIN  (NITROSTAT ) 0.4 MG SL tablet DISSOLVE ONE TABLET UNDER THE TONGUE EVERY 5 MINUTES AS NEEDED FOR CHEST PAIN.  DO NOT EXCEED A TOTAL OF 3 DOSES IN 15 MINUTES   [DISCONTINUED] rivaroxaban  (XARELTO ) 20 MG TABS tablet TAKE 1 TABLET BY MOUTH DAILY  WITH SUPPER    Physical Exam:    VS:  BP 132/78   Pulse 93   Ht 5' 1 (1.549 m)   Wt 212 lb 6.4 oz (96.3 kg)   SpO2 97%   BMI 40.13 kg/m    Wt Readings from Last 3 Encounters:  12/15/23 212 lb  6.4 oz (96.3 kg)  11/28/23 214 lb 9.6 oz (97.3 kg)  08/22/23 208 lb 3.2 oz (94.4 kg)    GEN: Well nourished, well developed in no acute distress NECK: No JVD; No carotid bruits CARDIAC: Irregular rate irregular rhythm, no murmurs, rubs, gallops RESPIRATORY:  Clear to auscultation without rales, wheezing or rhonchi  ABDOMEN: Soft, non-tender, non-distended EXTREMITIES: Mild nonpitting ankle edema; No acute deformity     Asessement and Plan:.    Chronic combined CHF: Patient with  history of combined systolic and diastolic heart failure initially diagnosed in 2014 in setting of tachycardia induced cardiomyopathy in context of A-fib with RVR.  EF improved from 30 to 50%.  She has been managed with metoprolol  and Lasix  however has noted history of nonadherence.  Patient presented today at recommendation of her oncologist/hematologist for gradual weight gain since December as well as increased lower extremity edema.  Today patient reports that her legs truly are not that swollen, on exam she has mild bilateral lower extremity edema that is nonpitting.  Patient denies any significant shortness of breath, orthopnea or PND.  Of note she does typically sleep late all the way back in her recliner, per her daughter this has been ongoing for years.  Patient denies any shortness of breath when leaning back in the recliner.  Patient notes that her abdomen may be very slightly more swollen than normal but does not feel there has been a significant change.  Question if some of her weight gain is fluid versus being overall sedentary for the last few months, she has muscle weakness which appears to be related to deconditioning.  Patient reports that she does not take her Lasix  as this results in increased urination and she does not want to go to the bathroom frequently.  Stressed the importance of adherence with medications with patient, she will take Lasix  20 mg daily for the next 3 days with potassium supplementation.  Recheck BMET in 10 days.  Reviewed ED precautions.  Chronic atrial fibrillation: Patient with history of, atrial fibrillation, frequently with RVR, history of nonadherence to metoprolol  leading to tachycardia.  EKG today indicates atrial fibrillation not 103 bpm, patient reports she has not taken her medications yet today.  She denies any significant palpitations or feeling of increased heart rates. Emphasized medication adherence to control heart rate and prevent heart failure  exacerbations. Continue metoprolol  25 mg daily and Xarelto .  Chest pain: Patient reports 1 episode of chest discomfort last week while sitting and watching TV, reports she is watching a medical show in which patient was actively having a heart attack.  She then started having left-sided chest discomfort but resolved in under 10 minutes, not associated with exertion.  Patient denies any chest pain, pressure or tightness on exertion.  She denies any recurrence of chest discomfort.  Patient feels that she became increasingly anxious because of the show and had not taken any of her medications that day resulting in chest discomfort.  She will continue to monitor for symptoms and notify the office if more persistent.  Reviewed ED precautions.  Refill of her nitroglycerin  was provided.  DVT and superficial vein thrombosis: Right brachial vein DVT and SVT managed with Xarelto . Patient reports compliance with Xarelto .  She denies any bleeding problems.  Continue Xarelto  20 mg daily, refill provided.   HTN: Blood pressure today 132/78.  Continue metoprolol  succinate 25 mg daily.  CKD: Last creatinine 0.98 on 11/28/23.   Invasive adenocarcinoma of the cecum: Malignant obstructing  tumor in the cecum compatible with invasive adenocarcinoma, s/p hemicolectomy.  No chemotherapy or radiation was required.  She is followed by oncology with regular PET scans.   Disposition: F/u with Adrieana Fennelly, NP in 6-8 weeks   Signed, Cimone Fahey D Eliane Hammersmith, NP

## 2023-12-15 ENCOUNTER — Ambulatory Visit: Attending: Cardiology | Admitting: Cardiology

## 2023-12-15 ENCOUNTER — Encounter: Payer: Self-pay | Admitting: Cardiology

## 2023-12-15 VITALS — BP 132/78 | HR 93 | Ht 61.0 in | Wt 212.4 lb

## 2023-12-15 DIAGNOSIS — N182 Chronic kidney disease, stage 2 (mild): Secondary | ICD-10-CM | POA: Insufficient documentation

## 2023-12-15 DIAGNOSIS — Z7901 Long term (current) use of anticoagulants: Secondary | ICD-10-CM | POA: Insufficient documentation

## 2023-12-15 DIAGNOSIS — I5042 Chronic combined systolic (congestive) and diastolic (congestive) heart failure: Secondary | ICD-10-CM | POA: Insufficient documentation

## 2023-12-15 DIAGNOSIS — I10 Essential (primary) hypertension: Secondary | ICD-10-CM | POA: Diagnosis not present

## 2023-12-15 DIAGNOSIS — R072 Precordial pain: Secondary | ICD-10-CM | POA: Insufficient documentation

## 2023-12-15 DIAGNOSIS — I4821 Permanent atrial fibrillation: Secondary | ICD-10-CM | POA: Diagnosis not present

## 2023-12-15 DIAGNOSIS — C189 Malignant neoplasm of colon, unspecified: Secondary | ICD-10-CM | POA: Insufficient documentation

## 2023-12-15 DIAGNOSIS — I82A11 Acute embolism and thrombosis of right axillary vein: Secondary | ICD-10-CM | POA: Diagnosis not present

## 2023-12-15 MED ORDER — FUROSEMIDE 20 MG PO TABS: ORAL_TABLET | ORAL | 2 refills | Status: DC

## 2023-12-15 MED ORDER — POTASSIUM CHLORIDE ER 10 MEQ PO TBCR
EXTENDED_RELEASE_TABLET | ORAL | 2 refills | Status: DC
Start: 2023-12-15 — End: 2024-03-25

## 2023-12-15 MED ORDER — NITROGLYCERIN 0.4 MG SL SUBL: 0.4000 mg | SUBLINGUAL_TABLET | SUBLINGUAL | 2 refills | Status: AC | PRN

## 2023-12-15 MED ORDER — RIVAROXABAN 20 MG PO TABS: 20.0000 mg | ORAL_TABLET | Freq: Every day | ORAL | 3 refills | Status: AC

## 2023-12-15 MED ORDER — POTASSIUM CHLORIDE ER 10 MEQ PO TBCR: 10.0000 meq | EXTENDED_RELEASE_TABLET | Freq: Every day | ORAL | 2 refills | Status: DC

## 2023-12-15 NOTE — Patient Instructions (Addendum)
 Medication Instructions:  Start Lasix  20 mg daily for the next 3 days then take as needed for lower extremity Edema, SOB, weight gain of 3 lbs overnight and 5 lbs in a week. Take Potassium chloride  10 meq once a day for the next 3 days then take as needed when you take as needed with lasix  *If you need a refill on your cardiac medications before your next appointment, please call your pharmacy*  Lab Work: In 10 days we are going to to need a Bmet If you have labs (blood work) drawn today and your tests are completely normal, you will receive your results only by: MyChart Message (if you have MyChart) OR A paper copy in the mail If you have any lab test that is abnormal or we need to change your treatment, we will call you to review the results.  Testing/Procedures: No testing  Follow-Up: At [facility], you and your health needs are our priority.  As part of our continuing mission to provide you with exceptional heart care, our providers are all part of one team.  This team includes your primary Cardiologist (physician) and Advanced Practice Providers or APPs (Physician Assistants and Nurse Practitioners) who all work together to provide you with the care you need, when you need it.  Your next appointment:   8 week(s)  Provider:   Katlyn West, NP    We recommend signing up for the patient portal called MyChart.  Sign up information is provided on this After Visit Summary.  MyChart is used to connect with patients for Virtual Visits (Telemedicine).  Patients are able to view lab/test results, encounter notes, upcoming appointments, etc.  Non-urgent messages can be sent to your provider as well.   To learn more about what you can do with MyChart, go to [email or web address].

## 2023-12-26 DIAGNOSIS — Z7901 Long term (current) use of anticoagulants: Secondary | ICD-10-CM | POA: Diagnosis not present

## 2023-12-26 DIAGNOSIS — N182 Chronic kidney disease, stage 2 (mild): Secondary | ICD-10-CM | POA: Diagnosis not present

## 2023-12-26 DIAGNOSIS — E7849 Other hyperlipidemia: Secondary | ICD-10-CM | POA: Diagnosis not present

## 2023-12-26 DIAGNOSIS — C189 Malignant neoplasm of colon, unspecified: Secondary | ICD-10-CM | POA: Diagnosis not present

## 2023-12-26 DIAGNOSIS — I4821 Permanent atrial fibrillation: Secondary | ICD-10-CM | POA: Diagnosis not present

## 2023-12-26 DIAGNOSIS — I82A11 Acute embolism and thrombosis of right axillary vein: Secondary | ICD-10-CM | POA: Diagnosis not present

## 2023-12-26 DIAGNOSIS — I5042 Chronic combined systolic (congestive) and diastolic (congestive) heart failure: Secondary | ICD-10-CM | POA: Diagnosis not present

## 2023-12-26 DIAGNOSIS — I4891 Unspecified atrial fibrillation: Secondary | ICD-10-CM | POA: Diagnosis not present

## 2023-12-26 DIAGNOSIS — I10 Essential (primary) hypertension: Secondary | ICD-10-CM | POA: Diagnosis not present

## 2023-12-27 LAB — BASIC METABOLIC PANEL WITH GFR
BUN/Creatinine Ratio: 8 — ABNORMAL LOW (ref 12–28)
BUN: 8 mg/dL (ref 8–27)
CO2: 20 mmol/L (ref 20–29)
Calcium: 9 mg/dL (ref 8.7–10.3)
Chloride: 104 mmol/L (ref 96–106)
Creatinine, Ser: 0.97 mg/dL (ref 0.57–1.00)
Glucose: 79 mg/dL (ref 70–99)
Potassium: 3.9 mmol/L (ref 3.5–5.2)
Sodium: 138 mmol/L (ref 134–144)
eGFR: 60 mL/min/1.73 (ref >59)

## 2023-12-28 ENCOUNTER — Ambulatory Visit: Payer: Self-pay | Admitting: Cardiology

## 2024-02-06 ENCOUNTER — Ambulatory Visit: Admitting: Cardiology

## 2024-02-10 ENCOUNTER — Other Ambulatory Visit: Payer: Self-pay | Admitting: Cardiology

## 2024-02-18 ENCOUNTER — Telehealth: Payer: Self-pay | Admitting: Family Medicine

## 2024-02-18 NOTE — Telephone Encounter (Signed)
 Copied from CRM [id number]. Topic: General - Call Back - No Documentation >> Feb 18, 2024 10:12 AM Drema MATSU wrote: Reason for CRM: Patient daughter received a call from clinic but there is no documentation as to who called.

## 2024-02-18 NOTE — Telephone Encounter (Signed)
 Returned call to daughter who is wondering why someone called her about her mom. Advised I have not called about anything. Call disconnected

## 2024-02-20 ENCOUNTER — Ambulatory Visit: Admitting: Cardiology

## 2024-02-20 ENCOUNTER — Ambulatory Visit: Attending: Cardiology | Admitting: Cardiology

## 2024-02-20 ENCOUNTER — Encounter: Payer: Self-pay | Admitting: Cardiology

## 2024-02-20 VITALS — BP 120/88 | HR 93 | Ht 61.0 in | Wt 214.0 lb

## 2024-02-20 DIAGNOSIS — Z7901 Long term (current) use of anticoagulants: Secondary | ICD-10-CM | POA: Insufficient documentation

## 2024-02-20 DIAGNOSIS — I10 Essential (primary) hypertension: Secondary | ICD-10-CM | POA: Diagnosis not present

## 2024-02-20 DIAGNOSIS — I4821 Permanent atrial fibrillation: Secondary | ICD-10-CM | POA: Diagnosis not present

## 2024-02-20 NOTE — Patient Instructions (Signed)

## 2024-02-20 NOTE — Progress Notes (Signed)
 Cardiology Office Note:  .   Date:  02/20/2024  ID:  Natasha Dickson, DOB 02/14/1946, MRN [id number] PCP: Cleatus Arlyss RAMAN, MD  [facility] Providers Cardiologist:  Oneil Parchment, MD     History of Present Illness: .    History of Present Illness Natasha Dickson is a 78 year old female with tachycardia induced cardiomyopathy in the setting of atrial fibrillation and RVR who presents for follow-up. She is accompanied by her daughter, Natasha Dickson.  She has a history of tachycardia induced cardiomyopathy associated with atrial fibrillation and rapid ventricular response (RVR). Her ejection fraction (EF) was initially 30-35% but improved to 50-55% in 2015. A subsequent echocardiogram in 2016 showed a decrease to 45-50%. In 2023, she was admitted with generalized weakness and found to have anemia and AFib RVR. Her most recent echocardiogram in 2023 showed an EF of 55-60%.  She has a history of hypertension, hyperlipidemia, and chronic kidney disease. Her current medications include atorvastatin  10 mg daily, metoprolol  25 mg, and Xarelto  20 mg daily. She takes Lasix  infrequently, only when she experiences swelling, and accompanies it with potassium.  In 2023, she was diagnosed with malignant invasive adenocarcinoma of the cecum and ileocecal valve and developed a right brachial vein deep vein thrombosis (DVT), for which she was started on Xarelto .  She experiences ongoing weakness and difficulty moving between positions, describing an episode where she felt unable to stand and needed assistance. She walks very slowly and uses a sit-to-stand assist device to help her get up from a seated position. She occasionally experiences back pain, which has been acting up recently.  She denies shortness of breath.    Studies Reviewed: .        Results DIAGNOSTIC Echocardiogram: EF 55-60% (2023)  PATHOLOGY Malignant invasive adenocarcinoma of the cecum and ileocecal valve Risk Assessment/Calculations:             Physical Exam:   VS:  BP 120/88   Pulse 93   Ht 5' 1 (1.549 m)   Wt 214 lb (97.1 kg)   SpO2 97%   BMI 40.43 kg/m    Wt Readings from Last 3 Encounters:  02/20/24 214 lb (97.1 kg)  12/15/23 212 lb 6.4 oz (96.3 kg)  11/28/23 214 lb 9.6 oz (97.3 kg)    GEN: Well nourished, well developed in no acute distress NECK: No JVD; No carotid bruits CARDIAC: Irreg irreg, no murmurs, no rubs, no gallops RESPIRATORY:  Clear to auscultation without rales, wheezing or rhonchi  ABDOMEN: Soft, non-tender, non-distended EXTREMITIES:  No edema; No deformity   ASSESSMENT AND PLAN: .    Assessment and Plan Assessment & Plan Tachycardia-induced cardiomyopathy in the setting of atrial fibrillation with rapid ventricular response Tachycardia-induced cardiomyopathy due to atrial fibrillation with rapid ventricular response. Echocardiogram in 2023 showed normal pump function with EF of 55-60%. Heart rate and blood pressure are well-controlled with current medications. - Continue Metoprolol  25 mg daily - Continue Xarelto  20 mg daily  Chronic combined systolic and diastolic heart failure Chronic heart failure with improved ejection fraction. Echocardiogram in 2023 showed EF of 55-60%. No current symptoms of heart failure exacerbation. Discussed the importance of mobility and exercise to maintain strength and prevent deconditioning. - Continue Lasix  20 mg as needed for fluid retention  Hypertension Blood pressure is well-controlled with current medication regimen. - Continue current antihypertensive regimen  Hyperlipidemia On atorvastatin  10 mg daily for lipid management. Importance of keeping plaque stable discussed. - Continue atorvastatin  10 mg  daily  Deep vein thrombosis of right brachial vein, on anticoagulation Right brachial vein DVT, currently on Xarelto  for anticoagulation. - Continue Xarelto  20 mg daily         Signed, Oneil Parchment, MD

## 2024-03-01 ENCOUNTER — Other Ambulatory Visit: Payer: Self-pay | Admitting: Cardiology

## 2024-03-01 DIAGNOSIS — E782 Mixed hyperlipidemia: Secondary | ICD-10-CM

## 2024-03-01 DIAGNOSIS — I10 Essential (primary) hypertension: Secondary | ICD-10-CM

## 2024-03-01 DIAGNOSIS — N182 Chronic kidney disease, stage 2 (mild): Secondary | ICD-10-CM

## 2024-03-01 DIAGNOSIS — I4821 Permanent atrial fibrillation: Secondary | ICD-10-CM

## 2024-03-01 DIAGNOSIS — C189 Malignant neoplasm of colon, unspecified: Secondary | ICD-10-CM

## 2024-03-01 DIAGNOSIS — E876 Hypokalemia: Secondary | ICD-10-CM

## 2024-03-01 DIAGNOSIS — Z79899 Other long term (current) drug therapy: Secondary | ICD-10-CM

## 2024-03-01 DIAGNOSIS — I82A11 Acute embolism and thrombosis of right axillary vein: Secondary | ICD-10-CM

## 2024-03-01 DIAGNOSIS — I5042 Chronic combined systolic (congestive) and diastolic (congestive) heart failure: Secondary | ICD-10-CM

## 2024-03-01 DIAGNOSIS — I4891 Unspecified atrial fibrillation: Secondary | ICD-10-CM

## 2024-03-01 DIAGNOSIS — D500 Iron deficiency anemia secondary to blood loss (chronic): Secondary | ICD-10-CM

## 2024-03-01 DIAGNOSIS — Z7901 Long term (current) use of anticoagulants: Secondary | ICD-10-CM

## 2024-03-23 ENCOUNTER — Other Ambulatory Visit: Payer: Self-pay | Admitting: Cardiology

## 2024-03-29 ENCOUNTER — Other Ambulatory Visit: Payer: Self-pay | Admitting: Family Medicine

## 2024-03-29 ENCOUNTER — Ambulatory Visit: Payer: Self-pay

## 2024-03-29 NOTE — Telephone Encounter (Signed)
 I wouldn't start paxlovid preemptively esp with patient taking xarelto .  If she has symptoms, please check a home test and update us .  Thanks.

## 2024-03-29 NOTE — Telephone Encounter (Signed)
 Patient's daughter, Richardson called with concerns for probable COVID exposure today. Patient's husband was with their other daughter and grandson over the weekend on a train. Daughter and grandson both tested positive for COVID today. Patient's husband is running a fever today. Patient was dropped off at her home today and found her husband ill. Richardson is concerned for her mother as she is medical comprised along with her father. Richardson is asking if patient should be started on Paxlovid as a precaution. Richardson would like a call back from PCP office with recommendation. Would like to be called at [phone]. If she is not available, would like a call on her cell phone which is listed.   FYI Only or Action Required?: Action required by provider: clinical question for provider.  Patient was last seen in primary care on 08/22/2023 by Cleatus Arlyss RAMAN, MD.  Called Nurse Triage reporting Covid Exposure.  Symptoms began today.  Interventions attempted: Rest, hydration, or home remedies.  Symptoms are: stable.  Triage Disposition: Call PCP Within 24 Hours  Patient/caregiver understands and will follow disposition?:   Copied from CRM #[id number]. Topic: Clinical - Red Word Triage >> Mar 29, 2024 10:48 AM Charolett L wrote: [facility] that prompted transfer to Nurse Triage: patient exposed to covid Reason for Disposition  [1] COVID-19 EXPOSURE within last 14 days AND [2] weak immune system (e.g., HIV positive, cancer chemo, splenectomy, organ transplant, chronic steroids) AND [3] NO symptoms  Answer Assessment - Initial Assessment Questions 1. COVID-19 EXPOSURE: Please describe how you were exposed to someone with a COVID-19 infection.     Patient was just exposed to her husband. Patient's daughter and nephew tested positive today for COVID. Patient's husband was with daughter and nephew over the weekend on a train. Husband is running a fever this morning.  2. PLACE of CONTACT: Where were you when you  were exposed to COVID-19? (e.g., home, school, medical waiting room; which city?)     Exposed at home.  3. TYPE of CONTACT: How much contact was there? (e.g., sitting next to, live in same house, work in same office, same building)     Lives in the same house 4. DURATION of CONTACT: How long were you in contact with the COVID-19 patient? (e.g., a few seconds, passed by person, a few minutes, 15 minutes or longer, live with the patient)     Lives with her husband who has probable COVID 5. DATE of CONTACT: When did you have contact with a COVID-19 patient? (e.g., hours, days ago)     today 6. MASK: Were you wearing a mask? Was the other person wearing a mask? Note: wearing a mask reduces the risk of an otherwise close contact.     no 7. SYMPTOMS: Do you have any symptoms? (e.g., fever, cough, breathing difficulty, loss of taste or smell)     no 8. COVID-19 VACCINE: Have you had the COVID-19 vaccine? If Yes, ask: When did you last get it?     Has gotten a couple 10. HIGH RISK: Do you have any heart or lung problems? (e.g., asthma, COPD, heart failure) Do you have a weak immune system or other risk factors? (e.g., HIV positive, chemotherapy, renal failure, diabetes mellitus, sickle cell anemia, obesity)       yes  Protocols used: COVID-19 - Exposure-A-AH

## 2024-03-29 NOTE — Telephone Encounter (Signed)
 Daughter calling back concerned about the patient catching COVID. She states the patient has no symptoms at this time. She would like to know if the patient could be prescribed Paxlovid or of any other recommendations. Please advise.

## 2024-03-29 NOTE — Telephone Encounter (Signed)
 Natasha Rosina Helling, RN to Me  (Selected Message)   03/29/24  5:13 PM Called and spoke with Richardson. Relayed Dr. Elfredia message.  Richardson reported that her father has now tested positive.  Patient and spouse are staying away from one another best that they can.  Encouraged Richardson to call with any concerns.      =============== Noted. Thanks.

## 2024-03-29 NOTE — Progress Notes (Unsigned)
 I wouldn't start paxlovid preemptively esp with patient taking xarelto .  If she has symptoms, please check a home test and update us .  Thanks.

## 2024-04-13 ENCOUNTER — Other Ambulatory Visit: Payer: Self-pay | Admitting: Cardiology

## 2024-04-13 DIAGNOSIS — I4891 Unspecified atrial fibrillation: Secondary | ICD-10-CM

## 2024-04-13 DIAGNOSIS — N182 Chronic kidney disease, stage 2 (mild): Secondary | ICD-10-CM

## 2024-04-13 DIAGNOSIS — I10 Essential (primary) hypertension: Secondary | ICD-10-CM

## 2024-04-13 DIAGNOSIS — E876 Hypokalemia: Secondary | ICD-10-CM

## 2024-04-13 DIAGNOSIS — C189 Malignant neoplasm of colon, unspecified: Secondary | ICD-10-CM

## 2024-04-13 DIAGNOSIS — Z79899 Other long term (current) drug therapy: Secondary | ICD-10-CM

## 2024-04-13 DIAGNOSIS — I5042 Chronic combined systolic (congestive) and diastolic (congestive) heart failure: Secondary | ICD-10-CM

## 2024-04-13 DIAGNOSIS — I4821 Permanent atrial fibrillation: Secondary | ICD-10-CM

## 2024-04-13 DIAGNOSIS — E782 Mixed hyperlipidemia: Secondary | ICD-10-CM

## 2024-04-13 DIAGNOSIS — D500 Iron deficiency anemia secondary to blood loss (chronic): Secondary | ICD-10-CM

## 2024-04-13 DIAGNOSIS — Z7901 Long term (current) use of anticoagulants: Secondary | ICD-10-CM

## 2024-04-13 DIAGNOSIS — I82A11 Acute embolism and thrombosis of right axillary vein: Secondary | ICD-10-CM

## 2024-05-14 ENCOUNTER — Ambulatory Visit (HOSPITAL_COMMUNITY)
Admission: RE | Admit: 2024-05-14 | Discharge: 2024-05-14 | Disposition: A | Source: Ambulatory Visit | Attending: Hematology and Oncology

## 2024-05-14 DIAGNOSIS — R222 Localized swelling, mass and lump, trunk: Secondary | ICD-10-CM | POA: Diagnosis not present

## 2024-05-14 DIAGNOSIS — I700 Atherosclerosis of aorta: Secondary | ICD-10-CM | POA: Diagnosis not present

## 2024-05-14 DIAGNOSIS — C189 Malignant neoplasm of colon, unspecified: Secondary | ICD-10-CM | POA: Diagnosis not present

## 2024-05-14 DIAGNOSIS — C182 Malignant neoplasm of ascending colon: Secondary | ICD-10-CM

## 2024-05-14 DIAGNOSIS — K8020 Calculus of gallbladder without cholecystitis without obstruction: Secondary | ICD-10-CM | POA: Diagnosis not present

## 2024-05-14 MED ORDER — IOHEXOL 9 MG/ML PO SOLN
500.0000 mL | ORAL | Status: AC
  Administered 2024-05-14 (×2): 500 mL via ORAL

## 2024-05-14 MED ORDER — IOHEXOL 300 MG/ML  SOLN
100.0000 mL | Freq: Once | INTRAMUSCULAR | Status: AC | PRN
  Administered 2024-05-14: 100 mL via INTRAVENOUS

## 2024-05-17 ENCOUNTER — Other Ambulatory Visit: Payer: Self-pay

## 2024-05-17 ENCOUNTER — Telehealth: Payer: Self-pay | Admitting: Physician Assistant

## 2024-05-17 ENCOUNTER — Telehealth: Payer: Self-pay

## 2024-05-17 ENCOUNTER — Other Ambulatory Visit: Payer: Self-pay | Admitting: Physician Assistant

## 2024-05-17 ENCOUNTER — Telehealth: Payer: Self-pay | Admitting: Family Medicine

## 2024-05-17 ENCOUNTER — Ambulatory Visit: Payer: Self-pay

## 2024-05-17 DIAGNOSIS — N6489 Other specified disorders of breast: Secondary | ICD-10-CM

## 2024-05-17 DIAGNOSIS — K5792 Diverticulitis of intestine, part unspecified, without perforation or abscess without bleeding: Secondary | ICD-10-CM

## 2024-05-17 DIAGNOSIS — N6310 Unspecified lump in the right breast, unspecified quadrant: Secondary | ICD-10-CM

## 2024-05-17 DIAGNOSIS — N859 Noninflammatory disorder of uterus, unspecified: Secondary | ICD-10-CM

## 2024-05-17 MED ORDER — METRONIDAZOLE 500 MG PO TABS: 500.0000 mg | ORAL_TABLET | Freq: Three times a day (TID) | ORAL | 0 refills | Status: AC

## 2024-05-17 MED ORDER — CIPROFLOXACIN HCL 500 MG PO TABS: 500.0000 mg | ORAL_TABLET | Freq: Two times a day (BID) | ORAL | 0 refills | Status: AC

## 2024-05-17 NOTE — Telephone Encounter (Signed)
 Returned call to Daughter, Richardson, who left message of  concern about sepsis and results of 05/14/24 CT scan for her mother, Natasha Dickson.  Message sent to [place] [employer], PA-C  as Richardson expressed concern that antibiotics need to be called in for her mom as well as a mammogram scheduled.  Pelvic US  scheduled for 05/18/24 at [facility].  Mammogram scheduled for 05/31/24 at [facility]. Daughter given appt information.  Daughter, Richardson, verbalized understanding of imaging appointment dates, place and times.  Richardson also verbalized understanding of the 2 prescriptions that have been sent to the pharmacy of their choice for pick up.

## 2024-05-17 NOTE — Telephone Encounter (Signed)
 Is she having abdominal pain or fevers? Has she contacted Dr. Lafonda clinic for management in the meantime?  If she is having abdominal symptoms, then please triage patient about that.  Thanks.

## 2024-05-17 NOTE — Telephone Encounter (Signed)
 Spoke with pt's daughter, Richardson. States that the pt had an abdominal CT on 05/14/24. They have not been contacted about the results, this was not ordered by our office. There are some concerning findings per Richardson (results came to her MyChart). Pt also has an enlarged node in one of her breasts and would like to have a mammogram ordered. Richardson would like for Dr. Cleatus to review the pt's CT and let her know what needs to be done.

## 2024-05-17 NOTE — Telephone Encounter (Signed)
 Patient/caller refused triage.  Reason for refusal: already scheduled requested procedures.  This clinical research associate returned call to patient for triage, reached daughter Richardson who stated we have already spoken to everyone we need to, thank you and thank Dr. Cleatus, and thank whoever got the ball rolling.   Pelvic ultrasound has been scheduled on 05/18/24 Mammogram has been scheduled on 05/31/24  No further needs at this time.   Copied from CRM [id number]. Topic: Clinical - Medical Advice >> May 17, 2024  3:02 PM Rea ORN wrote: Reason for CRM: Pt returning missed call from [place]. Per encounter, HIPOLITO is to triage pt: Unable to reach pts daughter, Richardson and left v/m requesting cb [phone]. E2C2 can triage and get additional info Dr Cleatus requested and can give Richardson instructions from Dr Cleatus also. Sending note to [facility] triage and FYI to Dr Cleatus and Cleatus pool. [facility] LPN Reason for Disposition  Caller has already spoken with the doctor (or NP/PA, pharmacist) and has no further questions.  Answer Assessment - Initial Assessment Questions This writer returned call to patient for triage, reached daughter Richardson who stated we have already spoken to everyone we need to, thank you and thank Dr. Cleatus, and thank whoever got the ball rolling.   Pelvic ultrasound has been scheduled on 05/18/24 Mammogram has been scheduled on 05/31/24  No further needs at this time.  Protocols used: No Contact or Duplicate Contact Call-A-AH

## 2024-05-17 NOTE — Telephone Encounter (Signed)
 Copied from CRM [id number]. Topic: Clinical - Request for Lab/Test Order >> May 17, 2024 12:03 PM Harlene ORN wrote: Reason for CRM: Im currently talking to the patient's daughter about the patient. Patient's recent CT results came back with positive for Divers and possible fistula. Requesting an abdominal ultrasound as well as mammogram for her growing nodule mass found in her right breast. Please call back the daughter to discuss. Tayvia, Faughnan (Daughter) [phone]

## 2024-05-17 NOTE — Telephone Encounter (Signed)
 I called and spoke to patient's daughter, Richardson, to review the CT scan results from 05/14/2024. 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 Recommended to start antibiotic therapy with cipro  plus flagyl  x 7 days. Reviewed results with on-call oncologist, Dr. Tina, who was in agreement with the plan. I will follow up with patient's surgeon, Dr. Lyndel, for any other recommendations. At this time, patient is not exhibiting any infectious symptoms including fevers or abdominal pain.   Other incidental findings include questionable endometrial thickening which will be further evaluated with pelvic US . In addition, there was a soft tissue nodule/lymph node in the right breast which will be evaluated with mammography.   Patient is scheduled for a follow up with  me next week. I will determine if repeat imaging is needed after discussion with Dr. Federico when he returns this week.   Patient's daughter expressed understanding of the plan provided.

## 2024-05-17 NOTE — Telephone Encounter (Signed)
 Unable to reach pts daughter, Richardson and left v/m requesting cb [phone]. E2C2 can triage and get additional info Dr Cleatus requested and can give Richardson instructions from Dr Cleatus also. Sending note to [facility] triage and FYI to Dr Cleatus and Cleatus pool.

## 2024-05-18 ENCOUNTER — Ambulatory Visit (HOSPITAL_COMMUNITY): Admission: RE | Admit: 2024-05-18 | Discharge: 2024-05-18 | Attending: Physician Assistant

## 2024-05-18 DIAGNOSIS — N859 Noninflammatory disorder of uterus, unspecified: Secondary | ICD-10-CM

## 2024-05-18 DIAGNOSIS — R9389 Abnormal findings on diagnostic imaging of other specified body structures: Secondary | ICD-10-CM | POA: Diagnosis not present

## 2024-05-18 NOTE — Telephone Encounter (Signed)
 FYI: declined triage, since calling requested procedures have been scheduled, spoke with specialist office.  ================= Noted. Thanks.

## 2024-05-18 NOTE — Telephone Encounter (Signed)
 This has been addressed by outside clinic. Thanks.

## 2024-05-19 ENCOUNTER — Ambulatory Visit: Admitting: Hematology and Oncology

## 2024-05-19 ENCOUNTER — Other Ambulatory Visit

## 2024-05-20 ENCOUNTER — Ambulatory Visit: Payer: Self-pay

## 2024-05-20 ENCOUNTER — Other Ambulatory Visit: Payer: Self-pay

## 2024-05-20 DIAGNOSIS — N859 Noninflammatory disorder of uterus, unspecified: Secondary | ICD-10-CM

## 2024-05-20 NOTE — Telephone Encounter (Signed)
-----   Message from [place] [employer] [employer] sent at 05/20/2024 11:47 AM EST ----- Can you notify the daughter Richardson that there is thickening noted again on the US  so patient needs to be evaluated by gynecology. If patient is not established, please make STAT referral

## 2024-05-20 NOTE — Telephone Encounter (Signed)
 T/C to pt's daughter to review US  results but she was not able to talk at the time and requested a call back later today from [place] to review her Mother's health.  [place] tried to reach her but she was still not available to talk.Will try again tomorrow STAT referral faxed to [facility]. Confirmation received

## 2024-05-21 ENCOUNTER — Telehealth: Payer: Self-pay | Admitting: Family Medicine

## 2024-05-21 NOTE — Telephone Encounter (Signed)
 Copied from CRM #[id number]. Topic: Clinical - Lab/Test Results >> May 21, 2024 11:24 AM Carlyon D wrote: Reason for CRM: Pt daughter Richardson is calling in regards to her mothers ct and US  results. Daughter is very very concerned states other Dr. Is out of town for the holidays and she is wanting some type of answers on what to do as her and mom are very concerned. She is asking that Dr. Cleatus or his assistant please give her a call [phone]

## 2024-05-24 ENCOUNTER — Telehealth: Payer: Self-pay | Admitting: Physician Assistant

## 2024-05-24 NOTE — Telephone Encounter (Signed)
 Please see which doc ordered the test- there should be a doc covering for them.  If they can't get any help there then let me know.  Thanks.

## 2024-05-24 NOTE — Telephone Encounter (Signed)
 I spoke to Natasha Dickson's daughter, Natasha Dickson, to review the pelvic US  results. There is evidence of endometrial thickening so referral was placed to gynecology for direct inspection and possible biopsy.   We will follow up on 05/28/2024 with labs after completing cipro /flagyl  for presumed diverticulitis seen on most recent CT scan. After discussion with Dr. Federico and Dr. Lyndel, we will hold on repeat CT imaging unless her symptoms worsen after antibiotics are completed.   Natasha Dickson is scheduled for mammogram and US  on 05/31/2024 to further evaluate  soft tissue nodule/lymph node in the right breast seen on most recent CT scan.   Natasha Dickson expressed understanding of the plan provided.

## 2024-05-24 NOTE — Telephone Encounter (Signed)
 Called daughter states that she has received results.

## 2024-05-27 ENCOUNTER — Other Ambulatory Visit: Payer: Self-pay | Admitting: *Deleted

## 2024-05-27 DIAGNOSIS — D500 Iron deficiency anemia secondary to blood loss (chronic): Secondary | ICD-10-CM

## 2024-05-27 NOTE — Progress Notes (Unsigned)
 [facility] [phone]   [phone]  PROGRESS NOTE  Patient Care Team: Cleatus Arlyss RAMAN, MD as PCP - General (Family Medicine) Jeffrie Oneil BROCKS, MD as PCP - Cardiology (Cardiology)  Hematological/Oncological History # Stage II (T3N0M0) Adenocarcinoma of the Colon 05/29/2022: colonoscopy performed and showed large cecal mass. Pathology confirmed an invasive moderately differentiated adenocarcinoma. Same day CT scan showed a  4.5 x 2.6 cm asymmetric wall thickening in cecum close to the ileocecal junction.  06/04/2022: laparoscopic right hemicolectomy. Pathology confirms T3N0 adenocarcinoma of the colon.  07/03/2022: establish care with Dr. Federico   Interval History:  Natasha Dickson 78 y.o. female with medical history significant for adenocarcinoma of the colon s/p resection who presents for a follow up visit. The patient's last visit was on 11/28/2023.In the interim since the last visit she has had no major changes in her health.  On exam today [name] reports ***  MEDICAL HISTORY:  Past Medical History:  Diagnosis Date   Breast cancer (HCC) 1999   Stage I in left breast   Chronic combined systolic and diastolic CHF (congestive heart failure) (HCC)    a. 02/2013 Echo: EF 30-35%, diff HK, biatrial enlargement. b. 2D echo 07/2014 showed mildly dilated LV, EF 45-50%, severely dilated left atrium and moderately dilated right atrium.   CKD (chronic kidney disease), stage II    Closed fracture of unspecified part of neck of femur 2011   Right.  Soreness when standing. (Dr. Anderson)   Colon cancer [facility]) 2023   s/p 05/2022 removed colon cancer   Nonischemic cardiomyopathy (HCC)    a. 02/2013 [facility]: No ischemia, prob attenuation vs scar, EF 40%-->Med Rx.   Persistent atrial fibrillation (HCC)    a. 02/2013: s/p TEE and attempted cardioversion x 2-->failed-->rate controlled with bb/digoxin , Xarelto  initiated.    SURGICAL HISTORY: Past Surgical History:   Procedure Laterality Date   BIOPSY  05/29/2022   Procedure: BIOPSY;  Surgeon: San Sandor GAILS, DO;  Location: [facility] ENDOSCOPY;  Service: Gastroenterology;;   CARDIOVERSION N/A 02/18/2013   Procedure: CARDIOVERSION;  Surgeon: Toribio JONELLE Fuel, MD;  Location: [facility] ENDOSCOPY;  Service: Cardiovascular;  Laterality: N/A;   CARDIOVERSION N/A 02/22/2013   Procedure: CARDIOVERSION;  Surgeon: Vina GAILS Gull, MD;  Location: [facility] ENDOSCOPY;  Service: Cardiovascular;  Laterality: N/A;   CHOLECYSTECTOMY Right 06/04/2022   Procedure: LAPAROSCOPIC HAND ASSISTED RIGHT COLECTOMY;  Surgeon: Lyndel Deward PARAS, MD;  Location: [facility] OR;  Service: General;  Laterality: Right;   COLONOSCOPY WITH PROPOFOL  N/A 05/29/2022   Procedure: COLONOSCOPY WITH PROPOFOL ;  Surgeon: San Sandor GAILS, DO;  Location: [facility] ENDOSCOPY;  Service: Gastroenterology;  Laterality: N/A;   ESOPHAGOGASTRODUODENOSCOPY (EGD) WITH PROPOFOL  N/A 05/29/2022   Procedure: ESOPHAGOGASTRODUODENOSCOPY (EGD) WITH PROPOFOL ;  Surgeon: San Sandor GAILS, DO;  Location: [facility] ENDOSCOPY;  Service: Gastroenterology;  Laterality: N/A;   IR FLUORO GUIDE CV LINE RIGHT  06/02/2022   IR US  GUIDE VASC ACCESS RIGHT  06/02/2022   MASTECTOMY  06/10/1997   Left breast with saline implant.   PARTIAL HIP ARTHROPLASTY Right    POLYPECTOMY  05/29/2022   Procedure: POLYPECTOMY;  Surgeon: San Sandor GAILS, DO;  Location: [facility] ENDOSCOPY;  Service: Gastroenterology;;   SUBMUCOSAL TATTOO INJECTION  05/29/2022   Procedure: SUBMUCOSAL TATTOO INJECTION;  Surgeon: San Sandor GAILS, DO;  Location: [facility] ENDOS[facility];  Service: Gastroenterology;;   TEE WITHOUT CARDIOVERSION N/A 02/18/2013   Procedure: TRANSESOPHAGEAL ECHOCARDIOGRAM (TEE);  Surgeon: Toribio JONELLE Fuel, MD;  Location: [facility] ENDOSCOPY;  Service:  Cardiovascular;  Laterality: N/A;   TUBAL LIGATION      SOCIAL HISTORY: Social History   Socioeconomic History   Marital status: Married    Spouse name: Not on file   Number of  children: Not on file   Years of education: Not on file   Highest education level: Not on file  Occupational History   Occupation: Conservation Officer, [employer]    Employer: [employer]  Tobacco Use   Smoking status: Never   Smokeless tobacco: Never  Vaping Use   Vaping status: Never Used  Substance and Sexual Activity   Alcohol use: Yes    Alcohol/week: 0.0 standard drinks of alcohol    Comment: RARE   Drug use: No   Sexual activity: Never  Other Topics Concern   Not on file  Social History Narrative   High School grad.   Married 40+ years   Conservation Officer, Nature at [employer]   Social Drivers of Health   Tobacco Use: Low Risk (02/20/2024)   Patient History    Smoking Tobacco Use: Never    Smokeless Tobacco Use: Never    Passive Exposure: Not on file  Financial Resource Strain: Low Risk (11/07/2022)   Overall Financial Resource Strain (CARDIA)    Difficulty of Paying Living Expenses: Not hard at all  Food Insecurity: No Food Insecurity (11/07/2022)   Hunger Vital Sign    Worried About Running Out of Food in the Last Year: Never true    Ran Out of Food in the Last Year: Never true  Transportation Needs: No Transportation Needs (07/09/2022)   PRAPARE - Administrator, Civil Service (Medical): No    Lack of Transportation (Non-Medical): No  Physical Activity: Inactive (11/07/2022)   Exercise Vital Sign    Days of Exercise per Week: 0 days    Minutes of Exercise per Session: 0 min  Stress: No Stress Concern Present (11/07/2022)   [employer] of Occupational Health - Occupational Stress Questionnaire    Feeling of Stress : Not at all  Social Connections: Moderately Isolated (11/07/2022)   Social Connection and Isolation Panel    Frequency of Communication with Friends and Family: More than three times a week    Frequency of Social Gatherings with Friends and Family: More than three times a week    Attends Religious Services: Never    Database Administrator or Organizations: No     Attends Banker Meetings: Never    Marital Status: Married  Catering Manager Violence: Not At Risk (11/07/2022)   Humiliation, Afraid, Rape, and Kick questionnaire    Fear of Current or Ex-Partner: No    Emotionally Abused: No    Physically Abused: No    Sexually Abused: No  Depression (PHQ2-9): High Risk (08/22/2023)   Depression (PHQ2-9)    PHQ-2 Score: 12  Alcohol Screen: Low Risk (11/07/2022)   Alcohol Screen    Last Alcohol Screening Score (AUDIT): 0  Housing: Low Risk (11/07/2022)   Housing    Last Housing Risk Score: 0  Utilities: Not At Risk (11/07/2022)   AHC Utilities    Threatened with loss of utilities: No  Health Literacy: Not on file    FAMILY HISTORY: Family History  Problem Relation Age of Onset   COPD Mother        from long-term smoking   Colon polyps Father    Colon cancer Father    Heart disease Father    Heart attack Father    Hypertension  Father    Thyroid  cancer Father    Heart disease Brother    Congenital heart disease Son         transposition of great vessels- corrected with  MI  as an adult   Stroke Neg Hx    Esophageal cancer Neg Hx    Rectal cancer Neg Hx    Stomach cancer Neg Hx     ALLERGIES:  has no known allergies.  MEDICATIONS:  Current Outpatient Medications  Medication Sig Dispense Refill   acetaminophen  (TYLENOL ) 325 MG tablet Take 2 tablets (650 mg total) by mouth every 6 (six) hours as needed for mild pain, moderate pain, fever or headache.     atorvastatin  (LIPITOR) 10 MG tablet TAKE 1 TABLET BY MOUTH DAILY 90 tablet 3   furosemide  (LASIX ) 20 MG tablet TAKE 1 TABLET DAILY FOR 3 DAYS  THEN TAKE AS NEEDED FOR LOWER  EXTREMITY EDEMA, SHORTNESS OF  BREATH , WEIGHT GAIN OF 3 LBS  OVERNIGHT AND 5 LBS IN A WEEK 90 tablet 3   metoprolol  succinate (TOPROL -XL) 25 MG 24 hr tablet TAKE 1 TABLET BY MOUTH DAILY 90 tablet 3   nitroGLYCERIN  (NITROSTAT ) 0.4 MG SL tablet Place 1 tablet (0.4 mg total) under the tongue every 5 (five)  minutes as needed for chest pain. 25 tablet 2   potassium chloride  (KLOR-CON ) 10 MEQ tablet TAKE 1 TABLET BY MOUTH ONCE  DAILY FOR THE NEXT 3 DAYS, THEN  TAKE 1 TABLET BY MOUTH DAILY AS  NEEDED WITH THE LASIX  90 tablet 2   rivaroxaban  (XARELTO ) 20 MG TABS tablet Take 1 tablet (20 mg total) by mouth daily with supper. 90 tablet 3   No current facility-administered medications for this visit.    REVIEW OF SYSTEMS:   Constitutional: ( - ) fevers, ( - )  chills , ( - ) night sweats Eyes: ( - ) blurriness of vision, ( - ) double vision, ( - ) watery eyes Ears, nose, mouth, throat, and face: ( - ) mucositis, ( - ) sore throat Respiratory: ( - ) cough, ( - ) dyspnea, ( - ) wheezes Cardiovascular: ( - ) palpitation, ( - ) chest discomfort, ( - ) lower extremity swelling Gastrointestinal:  ( - ) nausea, ( - ) heartburn, ( - ) change in bowel habits Skin: ( - ) abnormal skin rashes Lymphatics: ( - ) new lymphadenopathy, ( - ) easy bruising Neurological: ( - ) numbness, ( - ) tingling, ( - ) new weaknesses Behavioral/Psych: ( - ) mood change, ( - ) new changes  All other systems were reviewed with the patient and are negative.  PHYSICAL EXAMINATION:  There were no vitals filed for this visit.    There were no vitals filed for this visit.     GENERAL: Well-appearing elderly Caucasian female, alert, no distress and comfortable SKIN: skin color, texture, turgor are normal, no rashes or significant lesions EYES: conjunctiva are pink and non-injected, sclera clear LUNGS: clear to auscultation and percussion with normal breathing effort HEART: regular rate & rhythm and no murmurs and no lower extremity edema Musculoskeletal: no cyanosis of digits and no clubbing  PSYCH: alert & oriented x 3, fluent speech NEURO: no focal motor/sensory deficits  LABORATORY DATA:  I have reviewed the data as listed    Latest Ref Rng & Units 11/28/2023   10:30 AM 05/30/2023   11:29 AM 11/29/2022   12:43 PM   CBC  WBC 4.0 - 10.5 K/uL 5.7  6.3  6.6   Hemoglobin 12.0 - 15.0 g/dL 85.4  84.3  85.8   Hematocrit 36.0 - 46.0 % 43.6  47.5  43.3   Platelets 150 - 400 K/uL 283  194  261        Latest Ref Rng & Units 12/26/2023    4:26 PM 11/28/2023   10:30 AM 05/30/2023   11:29 AM  CMP  Glucose 70 - 99 mg/dL 79  99  93   BUN 8 - 27 mg/dL 8  18  12    Creatinine 0.57 - 1.00 mg/dL 9.02  9.01  9.03   Sodium 134 - 144 mmol/L 138  137  139   Potassium 3.5 - 5.2 mmol/L 3.9  4.5  4.3   Chloride 96 - 106 mmol/L 104  105  109   CO2 20 - 29 mmol/L 20  24  23    Calcium  8.7 - 10.3 mg/dL 9.0  8.8  9.2   Total Protein 6.5 - 8.1 g/dL  7.4  7.1   Total Bilirubin 0.0 - 1.2 mg/dL  0.7  0.9   Alkaline Phos 38 - 126 U/L  117  106   AST 15 - 41 U/L  15  15   ALT 0 - 44 U/L  10  7     RADIOGRAPHIC STUDIES: US  Pelvis Complete Result Date: 05/18/2024 EXAM: PELVIC ULTRASOUND TECHNIQUE: Transabdominal pelvic duplex ultrasound using B-mode/gray scaled imaging with Doppler spectral analysis and color flow was obtained. COMPARISON: CT of the abdomen and pelvis dated 05/14/2024. CLINICAL HISTORY: endometrial thickening. FINDINGS: ULTRASOUND FINDINGS: UTERUS: Uterus measures 6.9 x 3.1 x 5.1 cm, within estimated volume of 58 ml. Uterus demonstrates normal myometrial echotexture. ENDOMETRIAL STRIPE: Endometrium measures 9 mm, which is abnormal for a postmenopausal female and could be secondary to hyperplasia, polyp, or neoplasm. RIGHT OVARY: Obscured by bowel gas. LEFT OVARY: Obscured by bowel gas. FREE FLUID: No free fluid. IMPRESSION: 1. Abnormal endometrial thickness of 9 mm in a postmenopausal female, possibly secondary to hyperplasia, polyp, or neoplasm. 2. Both ovaries are obscured by bowel gas. Electronically signed by: Evalene Coho MD 05/18/2024 11:59 AM EST RP Workstation: [id number]   CT CHEST ABDOMEN PELVIS W CONTRAST Result Date: 05/16/2024 CLINICAL DATA:  Colon cancer, monitor * Tracking Code: BO * EXAM: CT CHEST,  ABDOMEN, AND PELVIS WITH CONTRAST TECHNIQUE: Multidetector CT imaging of the chest, abdomen and pelvis was performed following the standard protocol during bolus administration of intravenous contrast. RADIATION DOSE REDUCTION: This exam was performed according to the departmental dose-optimization program which includes automated exposure control, adjustment of the mA and/or kV according to patient size and/or use of iterative reconstruction technique. CONTRAST:  OMNIPAQUE  IOHEXOL  300 MG/ML  SOLN COMPARISON:  Multiple priors including CT May 23, 2023 FINDINGS: CT CHEST FINDINGS Cardiovascular: Aortic atherosclerosis. Normal caliber thoracic aorta. Biatrial cardiac enlargement. No significant pericardial effusion/thickening. Mediastinum/Nodes: Stable 14 mm nodule in the thyroid  isthmus. No pathologically enlarged mediastinal, hilar or axillary lymph nodes. Small hiatal hernia. Lungs/Pleura: Biapical pleural-parenchymal scarring. No suspicious pulmonary nodules or masses. Scattered atelectasis/scarring. No pleural effusion. No pneumothorax. Central airways are patent. Musculoskeletal: No aggressive lytic or blastic lesion of bone. Diffuse demineralization of bone. Thoracic spondylosis with bridging anterior vertebral osteophytes. Soft tissue nodule/lymph node in the right breast measures 13 x 12 mm on image 23/2 previously 10 x 4 mm. Left breast prosthesis. CT ABDOMEN PELVIS FINDINGS Hepatobiliary: No suspicious hepatic lesion. Cholelithiasis. No biliary ductal dilation. Pancreas: No pancreatic ductal dilation or evidence  of acute inflammation. Spleen: No splenomegaly. Adrenals/Urinary Tract: No suspicious adrenal nodule/mass. No hydronephrosis. Kidneys demonstrate symmetric enhancement. Urinary bladder is unremarkable for degree of distension. Stomach/Bowel: Radiopaque enteric contrast material traverses the rectum. Stomach is unremarkable for degree of distension. No pathologic dilation of small or large  bowel. Prior right hemicolectomy with right upper quadrant ileocolic anastomosis. No new suspicious nodularity along the suture line. Left-sided colonic diverticulosis with short segment colonic wall thickening and stranding in the sigmoid mesentery with a fluid tract extending to the left ovary on image 92/2 concerning for acute sigmoid colonic diverticulitis with possible entero-ovarian fistula. Vascular/Lymphatic: Aortic atherosclerosis. Normal caliber abdominal aorta. Smooth IVC contours. No pathologically enlarged abdominal or pelvic lymph nodes. Reproductive: Question endometrial thickening although evaluation is limited by streak artifact from right hip arthroplasty. Other: No pneumoperitoneum. Musculoskeletal: No aggressive lytic or blastic lesion of bone. Diffuse demineralization of bone. Right total hip arthroplasty. Spondylosis. IMPRESSION: 1. Prior right hemicolectomy with right upper quadrant ileocolic anastomosis. No evidence of local recurrence. 2. No convincing evidence of metastatic disease within the chest, abdomen or pelvis. 3. Left-sided colonic diverticulosis with short segment colonic wall thickening and stranding in the sigmoid mesentery with a fluid tract extending to the left ovary concerning for acute sigmoid colonic diverticulitis with possible entero-ovarian fistula. 4. Question endometrial thickening although evaluation is limited by streak artifact from right hip arthroplasty. Consider further evaluation with pelvic ultrasound. 5. Soft tissue nodule/lymph node in the right breast measures 13 x 12 mm previously 10 x 4 mm. Recommend correlation with mammography. 6. Cholelithiasis. 7. Aortic atherosclerosis. Aortic Atherosclerosis (ICD10-I70.0). Electronically Signed   By: Reyes Holder M.D.   On: 05/16/2024 13:08     ASSESSMENT & PLAN Natasha Dickson is a 78 y.o.  female with medical history significant for adenocarcinoma of the colon s/p resection who presents for a follow up visit.    # Stage II (T3N0M0) Adenocarcinoma of the Colon -- Pathology results status post resection are consistent with a stage II adenocarcinoma the colon.  Findings appear low risk at this time.  No indication for adjuvant chemotherapy. --Patient underwent colonoscopy on 07/11/2023, with no evidence of residual recurrent disease. --Currently on surveillance.  --Most recent CT scan from 1205/2025 showed no evidence local recurrence or metastatic disease. Plan for CT scans every 12 months for the next 5 years. (Next due in June 2026). Plan for return to clinic every 6 months time for repeat labs and evaluation. --Labs today show *** --RTC for 6 months for labs and clinic visit   #Acute sigmoid colonic diverticulitis: --Seen on CT CAP from 05/14/2024.  --Treated with cipro /flagyl  x 7 days  #Endometrial thickening: --seen on CT on 05/14/2024 and pelvic US  on 05/18/2024 --sent referral to gynecology  #Soft tissue nodule/lymph node in right breast: --seen on Ct on 05/14/2024 --scheduled for mammogram and breast US  on 05/31/2024  No orders of the defined types were placed in this encounter.   All questions were answered. The patient knows to call the clinic with any problems, questions or concerns.   I have spent a total of {CHL ONC TIME VISIT - DTPQU:[id number]} minutes of face-to-face and non-face-to-face time, preparing to see the patient, obtaining and/or reviewing separately obtained history, performing a medically appropriate examination, counseling and educating the patient, ordering medications/tests/procedures, referring and communicating with other health care professionals, documenting clinical information in the electronic health record, independently interpreting results and communicating results to the patient, and care coordination.   Johnston Police PA-C  Dept of Hematology and Oncology [facility] Phone: [phone]   05/27/2024 10:50 PM

## 2024-05-28 ENCOUNTER — Inpatient Hospital Stay: Admitting: Physician Assistant

## 2024-05-28 ENCOUNTER — Inpatient Hospital Stay: Attending: Physician Assistant

## 2024-05-28 ENCOUNTER — Ambulatory Visit: Payer: Self-pay | Admitting: Physician Assistant

## 2024-05-28 VITALS — BP 113/103 | HR 87 | Temp 97.9°F | Resp 18 | Wt 203.2 lb

## 2024-05-28 DIAGNOSIS — N859 Noninflammatory disorder of uterus, unspecified: Secondary | ICD-10-CM | POA: Diagnosis not present

## 2024-05-28 DIAGNOSIS — N182 Chronic kidney disease, stage 2 (mild): Secondary | ICD-10-CM | POA: Diagnosis not present

## 2024-05-28 DIAGNOSIS — K5792 Diverticulitis of intestine, part unspecified, without perforation or abscess without bleeding: Secondary | ICD-10-CM | POA: Diagnosis not present

## 2024-05-28 DIAGNOSIS — Z85038 Personal history of other malignant neoplasm of large intestine: Secondary | ICD-10-CM | POA: Insufficient documentation

## 2024-05-28 DIAGNOSIS — K5732 Diverticulitis of large intestine without perforation or abscess without bleeding: Secondary | ICD-10-CM | POA: Diagnosis not present

## 2024-05-28 DIAGNOSIS — D509 Iron deficiency anemia, unspecified: Secondary | ICD-10-CM | POA: Diagnosis not present

## 2024-05-28 DIAGNOSIS — R9389 Abnormal findings on diagnostic imaging of other specified body structures: Secondary | ICD-10-CM | POA: Diagnosis not present

## 2024-05-28 DIAGNOSIS — C182 Malignant neoplasm of ascending colon: Secondary | ICD-10-CM | POA: Diagnosis not present

## 2024-05-28 DIAGNOSIS — D500 Iron deficiency anemia secondary to blood loss (chronic): Secondary | ICD-10-CM

## 2024-05-28 DIAGNOSIS — Z79899 Other long term (current) drug therapy: Secondary | ICD-10-CM | POA: Insufficient documentation

## 2024-05-28 DIAGNOSIS — E876 Hypokalemia: Secondary | ICD-10-CM | POA: Insufficient documentation

## 2024-05-28 DIAGNOSIS — Z800 Family history of malignant neoplasm of digestive organs: Secondary | ICD-10-CM | POA: Diagnosis not present

## 2024-05-28 LAB — CBC WITH DIFFERENTIAL (CANCER CENTER ONLY)
Abs Immature Granulocytes: 0.02 K/uL (ref 0.00–0.07)
Basophils Absolute: 0 K/uL (ref 0.0–0.1)
Basophils Relative: 1 %
Eosinophils Absolute: 0.2 K/uL (ref 0.0–0.5)
Eosinophils Relative: 3 %
HCT: 46.5 % — ABNORMAL HIGH (ref 36.0–46.0)
Hemoglobin: 15.3 g/dL — ABNORMAL HIGH (ref 12.0–15.0)
Immature Granulocytes: 0 %
Lymphocytes Relative: 25 %
Lymphs Abs: 1.4 K/uL (ref 0.7–4.0)
MCH: 29.5 pg (ref 26.0–34.0)
MCHC: 32.9 g/dL (ref 30.0–36.0)
MCV: 89.6 fL (ref 80.0–100.0)
Monocytes Absolute: 0.4 K/uL (ref 0.1–1.0)
Monocytes Relative: 7 %
Neutro Abs: 3.7 K/uL (ref 1.7–7.7)
Neutrophils Relative %: 64 %
Platelet Count: 255 K/uL (ref 150–400)
RBC: 5.19 MIL/uL — ABNORMAL HIGH (ref 3.87–5.11)
RDW: 14.9 % (ref 11.5–15.5)
WBC Count: 5.7 K/uL (ref 4.0–10.5)
nRBC: 0 % (ref 0.0–0.2)

## 2024-05-28 LAB — CMP (CANCER CENTER ONLY)
ALT: 9 U/L (ref 0–44)
AST: 26 U/L (ref 15–41)
Albumin: 3.7 g/dL (ref 3.5–5.0)
Alkaline Phosphatase: 92 U/L (ref 38–126)
Anion gap: 12 (ref 5–15)
BUN: 8 mg/dL (ref 8–23)
CO2: 26 mmol/L (ref 22–32)
Calcium: 8.7 mg/dL — ABNORMAL LOW (ref 8.9–10.3)
Chloride: 102 mmol/L (ref 98–111)
Creatinine: 1.18 mg/dL — ABNORMAL HIGH (ref 0.44–1.00)
GFR, Estimated: 47 mL/min — ABNORMAL LOW
Glucose, Bld: 111 mg/dL — ABNORMAL HIGH (ref 70–99)
Potassium: 3 mmol/L — ABNORMAL LOW (ref 3.5–5.1)
Sodium: 140 mmol/L (ref 135–145)
Total Bilirubin: 0.7 mg/dL (ref 0.0–1.2)
Total Protein: 7.2 g/dL (ref 6.5–8.1)

## 2024-05-28 LAB — IRON AND IRON BINDING CAPACITY (CC-WL,HP ONLY)
Iron: 68 ug/dL (ref 28–170)
Saturation Ratios: 20 % (ref 10.4–31.8)
TIBC: 340 ug/dL (ref 250–450)
UIBC: 272 ug/dL

## 2024-05-28 LAB — FERRITIN: Ferritin: 161 ng/mL (ref 11–307)

## 2024-05-31 ENCOUNTER — Encounter

## 2024-05-31 ENCOUNTER — Other Ambulatory Visit: Payer: Self-pay | Admitting: Physician Assistant

## 2024-05-31 ENCOUNTER — Inpatient Hospital Stay: Admission: RE | Admit: 2024-05-31

## 2024-05-31 ENCOUNTER — Telehealth: Payer: Self-pay | Admitting: *Deleted

## 2024-05-31 DIAGNOSIS — N6310 Unspecified lump in the right breast, unspecified quadrant: Secondary | ICD-10-CM

## 2024-05-31 DIAGNOSIS — N6489 Other specified disorders of breast: Secondary | ICD-10-CM

## 2024-05-31 DIAGNOSIS — N859 Noninflammatory disorder of uterus, unspecified: Secondary | ICD-10-CM

## 2024-05-31 DIAGNOSIS — K5792 Diverticulitis of intestine, part unspecified, without perforation or abscess without bleeding: Secondary | ICD-10-CM

## 2024-05-31 NOTE — Telephone Encounter (Signed)
 Received call from pt's daughter, Richardson. She states she has convinced her mother to have a GYN exam and biopsy. Johnston Police, PA-C has been notified. Myles Richardson that I would reach out to the GYN provider who has been sent this referral to have this scheduled. Noted in pt's chart that an attempt has been made to contact the patient w/o success. Message sent to [facility] with Dr. Vera Pa to call pt's daughter to have this scheduled. Pt did have mammogram done today and is now scheduled to have right breast biopsy done on 06/11/24 according to [facility].

## 2024-06-01 ENCOUNTER — Telehealth: Payer: Self-pay

## 2024-06-01 NOTE — Telephone Encounter (Signed)
 Rcvd call from pt daughter Richardson in regards to the referral for GYN. Notified Richardson that [place] had messaged the office yesterday to call pt daughter to get it scheduled. I msg'd Renda Cahill again today to see about calling and getting pt on schedule. Pt daughter verbalized understanding and is aware to watch for a call from their office.

## 2024-06-11 ENCOUNTER — Ambulatory Visit
Admission: RE | Admit: 2024-06-11 | Discharge: 2024-06-11 | Disposition: A | Discharge status: Home / Self Care | Source: Ambulatory Visit | Attending: Physician Assistant | Admitting: Physician Assistant

## 2024-06-11 DIAGNOSIS — N6310 Unspecified lump in the right breast, unspecified quadrant: Secondary | ICD-10-CM

## 2024-06-11 HISTORY — PX: BREAST BIOPSY: SHX20

## 2024-06-14 LAB — SURGICAL PATHOLOGY

## 2024-06-16 ENCOUNTER — Encounter: Payer: Self-pay | Admitting: *Deleted

## 2024-06-16 DIAGNOSIS — Z170 Estrogen receptor positive status [ER+]: Secondary | ICD-10-CM | POA: Insufficient documentation

## 2024-06-16 NOTE — Progress Notes (Signed)
 Spoke to daughter and relayed appt information. Patient has some dementia and daughter Natasha Dickson states she helps to care for her. She states she doesn't know how her mom will take the news as she initially didn't want to get the breast biopsy done. She said her mom shuts down when gets too much information/bad news. Has had cancer in past. Natasha Dickson manages her mom's appts and her sister manages their dad's care.  Natasha Dickson is aware of Dr. Milta appt on 1/12 and Dr. Walter appt on 1/29. Introduced surveyor, quantity given to [place] for needs/concerns.

## 2024-06-16 NOTE — Progress Notes (Signed)
 Request per Dr. Loretha for patient to see surgery first then med onc. Referral entered for [facility] and let their schedulers know. Once [facility] appt is make will get appt with Dr. Loretha. Will call daughter with appts when made.

## 2024-06-21 ENCOUNTER — Ambulatory Visit: Payer: Self-pay | Admitting: Surgery

## 2024-06-21 ENCOUNTER — Telehealth (HOSPITAL_BASED_OUTPATIENT_CLINIC_OR_DEPARTMENT_OTHER): Payer: Self-pay | Admitting: *Deleted

## 2024-06-21 DIAGNOSIS — C50911 Malignant neoplasm of unspecified site of right female breast: Secondary | ICD-10-CM

## 2024-06-21 NOTE — Telephone Encounter (Signed)
" ° °  Name: Natasha Dickson  DOB: 11-16-1945  MRN: [id number]  Primary Cardiologist: Oneil Parchment, MD   Preoperative team, please contact this patient and set up a phone call appointment for further preoperative risk assessment. Please obtain consent and complete medication review. Thank you for your help.  I confirm that guidance regarding antiplatelet and oral anticoagulation therapy has been completed and, if necessary, noted below.  Pharmacy recommendations for Xarelto  requested  I also confirmed the patient resides in the state of [place] . As per [facility] Medical Board telemedicine laws, the patient must reside in the state in which the provider is licensed.   Lamarr Satterfield, NP 06/21/2024, 4:07 PM [facility]    "

## 2024-06-21 NOTE — Telephone Encounter (Signed)
 Pharmacy please advise on holding Xarelto  for 2 days prior to lumpectomy scheduled for TBD . Thank you.

## 2024-06-21 NOTE — Telephone Encounter (Signed)
"  ° °  Pre-operative Risk Assessment    Patient Name: Natasha Dickson  DOB: [birth year] MRN: [id number]   Date of last office visit: 02/20/2024 Date of next office visit: None   Request for Surgical Clearance    Procedure:  Lumpectomy  Date of Surgery:  Clearance TBD                                 Surgeon:  Dr. Debby Shipper Surgeon's Group or Practice Name:  [facility] Phone number:  [phone] Fax number:  [phone]   Type of Clearance Requested:   - Medical  - Pharmacy:  Hold Rivaroxaban  (Xarelto ) 2 days prior   Type of Anesthesia:  General    Additional requests/questions:    Signed, Edsel Grayce Sanders   06/21/2024, 4:00 PM   "

## 2024-06-22 ENCOUNTER — Telehealth: Payer: Self-pay

## 2024-06-22 NOTE — Telephone Encounter (Signed)
"  °  Patient Consent for Virtual Visit         Natasha Dickson has provided verbal consent on 06/22/2024 for a virtual visit (video or telephone).  Appointment is scheduled for 06/25/24 at 1:40pm.Med req and consent are complete. Call patient daughter (POA caretaker) at [phone], per patient request.    CONSENT FOR VIRTUAL VISIT FOR:  Natasha Dickson  By participating in this virtual visit I agree to the following:  I hereby voluntarily request, consent and authorize [facility] and its employed or contracted physicians, physician assistants, nurse practitioners or other licensed health care professionals (the Practitioner), to provide me with telemedicine health care services (the Services) as deemed necessary by the treating Practitioner. I acknowledge and consent to receive the Services by the Practitioner via telemedicine. I understand that the telemedicine visit will involve communicating with the Practitioner through live audiovisual communication technology and the disclosure of certain medical information by electronic transmission. I acknowledge that I have been given the opportunity to request an in-person assessment or other available alternative prior to the telemedicine visit and am voluntarily participating in the telemedicine visit.  I understand that I have the right to withhold or withdraw my consent to the use of telemedicine in the course of my care at any time, without affecting my right to future care or treatment, and that the Practitioner or I may terminate the telemedicine visit at any time. I understand that I have the right to inspect all information obtained and/or recorded in the course of the telemedicine visit and may receive copies of available information for a reasonable fee.  I understand that some of the potential risks of receiving the Services via telemedicine include:  Delay or interruption in medical evaluation due to technological equipment failure or  disruption; Information transmitted may not be sufficient (e.g. poor resolution of images) to allow for appropriate medical decision making by the Practitioner; and/or  In rare instances, security protocols could fail, causing a breach of personal health information.  Furthermore, I acknowledge that it is my responsibility to provide information about my medical history, conditions and care that is complete and accurate to the best of my ability. I acknowledge that Practitioner's advice, recommendations, and/or decision may be based on factors not within their control, such as incomplete or inaccurate data provided by me or distortions of diagnostic images or specimens that may result from electronic transmissions. I understand that the practice of medicine is not an exact science and that Practitioner makes no warranties or guarantees regarding treatment outcomes. I acknowledge that a copy of this consent can be made available to me via my patient portal [facility] MyChart), or I can request a printed copy by calling the office of [facility].    I understand that my insurance will be billed for this visit.   I have read or had this consent read to me. I understand the contents of this consent, which adequately explains the benefits and risks of the Services being provided via telemedicine.  I have been provided ample opportunity to ask questions regarding this consent and the Services and have had my questions answered to my satisfaction. I give my informed consent for the services to be provided through the use of telemedicine in my medical care    "

## 2024-06-22 NOTE — Telephone Encounter (Signed)
 Appointment is scheduled for 06/25/24 at 1:40pm.Med req and consent are complete. Call patient daughter (POA caretaker) at [phone], per patient request.

## 2024-06-25 ENCOUNTER — Ambulatory Visit: Attending: Cardiology | Admitting: Emergency Medicine

## 2024-06-25 DIAGNOSIS — Z01810 Encounter for preprocedural cardiovascular examination: Secondary | ICD-10-CM | POA: Diagnosis not present

## 2024-06-25 NOTE — Telephone Encounter (Addendum)
 Patient with diagnosis of afib on Xareltp for anticoagulation.    Procedure: Lumpectomy  Date of procedure: TBD   CHA2DS2-VASc Score = 5   This indicates a 7.2% annual risk of stroke. 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   Per office protocol, patient can hold Xarelto  for 2 days prior to procedure.    **This guidance is not considered finalized until pre-operative APP has relayed final recommendations.**

## 2024-06-25 NOTE — Progress Notes (Signed)
 "   Virtual Visit via Telephone Note   Because of Natasha Dickson co-morbid illnesses, she is at least at moderate risk for complications without adequate follow up.  This format is felt to be most appropriate for this patient at this time.  Due to technical limitations with video connection (technology), today's appointment will be conducted as an audio only telehealth visit, and Natasha Dickson verbally agreed to proceed in this manner.   All issues noted in this document were discussed and addressed.  No physical exam could be performed with this format.  Evaluation Performed:  Preoperative cardiovascular risk assessment _____________   Date:  06/25/2024   Patient ID:  Natasha Dickson, DOB 1945/09/21, MRN [id number] Patient Location:  Home Provider location:   Office  Primary Care Provider:  Cleatus Arlyss RAMAN, MD Primary Cardiologist:  Natasha Parchment, MD  Chief Complaint / Patient Profile   79 y.o. y/o female with a h/o tachycardia induced cardiomyopathy in the setting of atrial fibrillation and RVR, chronic combined systolic and diastolic heart failure, hypertension, hyperlipidemia, DVT who is pending lumpectomy on date TBD with [facility] and presents today for telephonic preoperative cardiovascular risk assessment.  History of Present Illness    Natasha Dickson is a 79 y.o. female who presents via audio/video conferencing for a telehealth visit today.  Pt was last seen in cardiology clinic on 02/20/2024 by Dr. Parchment.  At that time Natasha Dickson was doing well.  The patient is now pending procedure as outlined above. Since her last visit, she is doing well without acute cardiovascular concerns or complaints.  Tells me her heart rate is well-controlled at home and she denies any episodes of fast heart rates or palpitations.  She stays somewhat active and can go up and down the stairs, mild yard work, and housework without limitation or exertional symptoms.  She denies any chest pains,  dyspnea, orthopnea, syncope, or dizziness.  No symptoms to suggest angina.  She is able to complete greater than 4 METS.  Past Medical History    Past Medical History:  Diagnosis Date   Breast cancer (HCC) 1999   Stage I in left breast   Chronic combined systolic and diastolic CHF (congestive heart failure) (HCC)    a. 02/2013 Echo: EF 30-35%, diff HK, biatrial enlargement. b. 2D echo 07/2014 showed mildly dilated LV, EF 45-50%, severely dilated left atrium and moderately dilated right atrium.   CKD (chronic kidney disease), stage II    Closed fracture of unspecified part of neck of femur 2011   Right.  Soreness when standing. (Dr. Anderson)   Colon cancer [facility]) 2023   s/p 05/2022 removed colon cancer   Nonischemic cardiomyopathy (HCC)    a. 02/2013 [facility] CL: No ischemia, prob attenuation vs scar, EF 40%-->Med Rx.   Persistent atrial fibrillation (HCC)    a. 02/2013: s/p TEE and attempted cardioversion x 2-->failed-->rate controlled with bb/digoxin , Xarelto  initiated.   Past Surgical History:  Procedure Laterality Date   BIOPSY  05/29/2022   Procedure: BIOPSY;  Surgeon: Natasha Sandor GAILS, DO;  Location: [facility] ENDOSCOPY;  Service: Gastroenterology;;   BREAST BIOPSY Right 06/11/2024   US  RT BREAST BX W LOC DEV 1ST LESION IMG BX SPEC US  GUIDE 06/11/2024 [facility] MAMMOGRAPHY   CARDIOVERSION N/A 02/18/2013   Procedure: CARDIOVERSION;  Surgeon: Toribio JONELLE Fuel, MD;  Location: [facility] ENDOSCOPY;  Service: Cardiovascular;  Laterality: N/A;   CARDIOVERSION N/A 02/22/2013   Procedure: CARDIOVERSION;  Surgeon: Vina Dickson Gull,  MD;  Location: [facility] ENDOSCOPY;  Service: Cardiovascular;  Laterality: N/A;   CHOLECYSTECTOMY Right 06/04/2022   Procedure: LAPAROSCOPIC HAND ASSISTED RIGHT COLECTOMY;  Surgeon: Lyndel Deward PARAS, MD;  Location: [facility] OR;  Service: General;  Laterality: Right;   COLONOSCOPY WITH PROPOFOL  N/A 05/29/2022   Procedure: COLONOSCOPY WITH PROPOFOL ;  Surgeon: Natasha Sandor GAILS, DO;  Location: [facility]  ENDOSCOPY;  Service: Gastroenterology;  Laterality: N/A;   ESOPHAGOGASTRODUODENOSCOPY (EGD) WITH PROPOFOL  N/A 05/29/2022   Procedure: ESOPHAGOGASTRODUODENOSCOPY (EGD) WITH PROPOFOL ;  Surgeon: Natasha Sandor GAILS, DO;  Location: [facility] ENDOSCOPY;  Service: Gastroenterology;  Laterality: N/A;   IR FLUORO GUIDE CV LINE RIGHT  06/02/2022   IR US  GUIDE VASC ACCESS RIGHT  06/02/2022   MASTECTOMY  06/10/1997   Left breast with saline implant.   PARTIAL HIP ARTHROPLASTY Right    POLYPECTOMY  05/29/2022   Procedure: POLYPECTOMY;  Surgeon: Natasha Sandor GAILS, DO;  Location: [facility] ENDOSCOPY;  Service: Gastroenterology;;   SUBMUCOSAL TATTOO INJECTION  05/29/2022   Procedure: SUBMUCOSAL TATTOO INJECTION;  Surgeon: Natasha Sandor GAILS, DO;  Location: [facility] ENDOS[facility];  Service: Gastroenterology;;   TEE WITHOUT CARDIOVERSION N/A 02/18/2013   Procedure: TRANSESOPHAGEAL ECHOCARDIOGRAM (TEE);  Surgeon: Toribio JONELLE Fuel, MD;  Location: [facility] ENDOSCOPY;  Service: Cardiovascular;  Laterality: N/A;   TUBAL LIGATION      Allergies  Allergies[1]  Home Medications    Prior to Admission medications  Medication Sig Start Date End Date Taking? Authorizing Provider  acetaminophen  (TYLENOL ) 325 MG tablet Take 2 tablets (650 mg total) by mouth every 6 (six) hours as needed for mild pain, moderate pain, fever or headache. 06/12/22   Bryn Bernardino NOVAK, MD  atorvastatin  (LIPITOR) 10 MG tablet TAKE 1 TABLET BY MOUTH DAILY 04/15/24   Jeffrie Natasha BROCKS, MD  furosemide  (LASIX ) 20 MG tablet TAKE 1 TABLET DAILY FOR 3 DAYS  THEN TAKE AS NEEDED FOR LOWER  EXTREMITY EDEMA, SHORTNESS OF  BREATH , WEIGHT GAIN OF 3 LBS  OVERNIGHT AND 5 LBS IN A WEEK Patient not taking: Reported on 05/28/2024 03/02/24   Jeffrie Natasha BROCKS, MD  metoprolol  succinate (TOPROL -XL) 25 MG 24 hr tablet TAKE 1 TABLET BY MOUTH DAILY 03/02/24   Jeffrie Natasha BROCKS, MD  nitroGLYCERIN  (NITROSTAT ) 0.4 MG SL tablet Place 1 tablet (0.4 mg total) under the tongue every 5 (five) minutes as needed for  chest pain. 12/15/23   West, Katlyn D, NP  potassium chloride  (KLOR-CON ) 10 MEQ tablet TAKE 1 TABLET BY MOUTH ONCE  DAILY FOR THE NEXT 3 DAYS, THEN  TAKE 1 TABLET BY MOUTH DAILY AS  NEEDED WITH THE LASIX  Patient not taking: Reported on 05/28/2024 03/25/24   Jeffrie Natasha BROCKS, MD  rivaroxaban  (XARELTO ) 20 MG TABS tablet Take 1 tablet (20 mg total) by mouth daily with supper. 12/15/23   West, Katlyn D, NP    Physical Exam    Vital Signs:  NAJWA SPILLANE does not have vital signs available for review today.  Given telephonic nature of communication, physical exam is limited. AAOx3. NAD. Normal affect.  Speech and respirations are unlabored.  Accessory Clinical Findings    None  Assessment & Plan    1.  Preoperative Cardiovascular Risk Assessment: According to the Revised Cardiac Risk Index (RCRI), her Perioperative Risk of Major Cardiac Event is (%): 0.9. Her Functional Capacity in METs is: 5.62 according to the Duke Activity Status Index (DASI).  Therefore, based on ACC/AHA guidelines, patient would be at acceptable risk for the planned procedure without further cardiovascular  testing. I will route this recommendation to the requesting party via Epic fax function.  The patient was advised that if she develops new symptoms prior to surgery to contact our office to arrange for a follow-up visit, and she verbalized understanding.  Per office protocol, patient can hold Xarelto  for 2 days prior to procedure.    A copy of this note will be routed to requesting surgeon.  Time:   Today, I have spent 8 minutes with the patient with telehealth technology discussing medical history, symptoms, and management plan.     Lum LITTIE Louis, NP  06/25/2024, 1:49 PM     [1] No Known Allergies  "

## 2024-06-30 ENCOUNTER — Other Ambulatory Visit: Payer: Self-pay | Admitting: Surgery

## 2024-06-30 DIAGNOSIS — C50911 Malignant neoplasm of unspecified site of right female breast: Secondary | ICD-10-CM

## 2024-07-01 ENCOUNTER — Ambulatory Visit: Payer: Self-pay | Admitting: Obstetrics and Gynecology

## 2024-07-01 ENCOUNTER — Encounter: Payer: Self-pay | Admitting: Obstetrics and Gynecology

## 2024-07-01 ENCOUNTER — Other Ambulatory Visit (HOSPITAL_COMMUNITY)
Admission: RE | Admit: 2024-07-01 | Discharge: 2024-07-01 | Disposition: A | Source: Ambulatory Visit | Attending: Obstetrics and Gynecology | Admitting: Obstetrics and Gynecology

## 2024-07-01 VITALS — BP 100/64 | HR 98 | Temp 97.5°F | Ht 60.25 in | Wt 197.0 lb

## 2024-07-01 DIAGNOSIS — R9389 Abnormal findings on diagnostic imaging of other specified body structures: Secondary | ICD-10-CM

## 2024-07-01 DIAGNOSIS — Z124 Encounter for screening for malignant neoplasm of cervix: Secondary | ICD-10-CM | POA: Insufficient documentation

## 2024-07-01 DIAGNOSIS — N813 Complete uterovaginal prolapse: Secondary | ICD-10-CM | POA: Insufficient documentation

## 2024-07-01 NOTE — Progress Notes (Unsigned)
 "  79 y.o. [id number] female here for ***. Married.  No LMP recorded. Patient is postmenopausal.    She reports ***. Urine sample provided: ***  Birth control: *** Last mammogram: *** Sexually active: ***    GYN HISTORY: ***  OB History  Gravida Para Term Preterm AB Living  3 3 3   2   SAB IAB Ectopic Multiple Live Births      3    # Outcome Date GA Lbr Len/2nd Weight Sex Type Anes PTL Lv  3 Term      Vag-Spont   DEC  2 Term      Vag-Spont   LIV  1 Term      Vag-Spont   LIV   Past Medical History:  Diagnosis Date   Breast cancer (HCC) 1999   Stage I in left breast   Chronic combined systolic and diastolic CHF (congestive heart failure) (HCC)    a. 02/2013 Echo: EF 30-35%, diff HK, biatrial enlargement. b. 2D echo 07/2014 showed mildly dilated LV, EF 45-50%, severely dilated left atrium and moderately dilated right atrium.   CKD (chronic kidney disease), stage II    Closed fracture of unspecified part of neck of femur 2011   Right.  Soreness when standing. (Dr. Anderson)   Colon cancer [facility]) 2023   s/p 05/2022 removed colon cancer   Nonischemic cardiomyopathy (HCC)    a. 02/2013 Lex[facility] CL: No ischemia, prob attenuation vs scar, EF 40%-->Med Rx.   Persistent atrial fibrillation (HCC)    a. 02/2013: s/p TEE and attempted cardioversion x 2-->failed-->rate controlled with bb/digoxin , Xarelto  initiated.   Past Surgical History:  Procedure Laterality Date   BIOPSY  05/29/2022   Procedure: BIOPSY;  Surgeon: San Sandor GAILS, DO;  Location: [facility];  Service: Gastroenterology;;   BREAST BIOPSY Right 06/11/2024   US  RT BREAST BX W LOC DEV 1ST LESION IMG BX SPEC US  GUIDE 06/11/2024 [facility] MAMMOGRAPHY   CARDIOVERSION N/A 02/18/2013   Procedure: CARDIOVERSION;  Surgeon: Toribio JONELLE Fuel, MD;  Location: [facility] ENDOSCOPY;  Service: Cardiovascular;  Laterality: N/A;   CARDIOVERSION N/A 02/22/2013   Procedure: CARDIOVERSION;  Surgeon: Vina GAILS Gull, MD;  Location: [facility] ENDOSCOPY;  Service:  Cardiovascular;  Laterality: N/A;   CHOLECYSTECTOMY Right 06/04/2022   Procedure: LAPAROSCOPIC HAND ASSISTED RIGHT COLECTOMY;  Surgeon: Lyndel Deward PARAS, MD;  Location: [facility] OR;  Service: General;  Laterality: Right;   COLONOSCOPY WITH PROPOFOL  N/A 05/29/2022   Procedure: COLONOSCOPY WITH PROPOFOL ;  Surgeon: San Sandor GAILS, DO;  Location: [facility];  Service: Gastroenterology;  Laterality: N/A;   ESOPHAGOGASTRODUODENOSCOPY (EGD) WITH PROPOFOL  N/A 05/29/2022   Procedure: ESOPHAGOGASTRODUODENOSCOPY (EGD) WITH PROPOFOL ;  Surgeon: San Sandor GAILS, DO;  Location: [facility] ENDOS[facility];  Service: Gastroenterology;  Laterality: N/A;   IR FLUORO GUIDE CV LINE RIGHT  06/02/2022   IR US  GUIDE VASC ACCESS RIGHT  06/02/2022   MASTECTOMY  06/10/1997   Left breast with saline implant.   PARTIAL HIP ARTHROPLASTY Right    POLYPECTOMY  05/29/2022   Procedure: POLYPECTOMY;  Surgeon: San Sandor GAILS, DO;  Location: [facility];  Service: Gastroenterology;;   SUBMUCOSAL TATTOO INJECTION  05/29/2022   Procedure: SUBMUCOSAL TATTOO INJECTION;  Surgeon: San Sandor GAILS, DO;  Location: [facility];  Service: Gastroenterology;;   TEE WITHOUT CARDIOVERSION N/A 02/18/2013   Procedure: TRANSESOPHAGEAL ECHOCARDIOGRAM (TEE);  Surgeon: Toribio JONELLE Fuel, MD;  Location: [facility] ENDOSCOPY;  Service: Cardiovascular;  Laterality: N/A;   TUBAL LIGATION     Medications Ordered Prior to Encounter[1]  Allergies[2]    PE Today's Vitals   07/01/24 1513  BP: 100/64  Pulse: 98  Temp: (!) 97.5 F (36.4 C)  TempSrc: Oral  SpO2: 100%  Weight: 197 lb (89.4 kg)  Height: 5' 0.25 (1.53 m)   Body mass index is 38.16 kg/m.  Physical Exam Vitals reviewed. Exam conducted with a chaperone present.  Constitutional:      General: She is not in acute distress.    Appearance: Normal appearance.  HENT:     Head: Normocephalic and atraumatic.     Nose: Nose normal.  Eyes:     Extraocular Movements: Extraocular movements  intact.     Conjunctiva/sclera: Conjunctivae normal.  Pulmonary:     Effort: Pulmonary effort is normal.  Genitourinary:    General: Normal vulva.     Exam position: Lithotomy position.     Vagina: Normal. No vaginal discharge.     Cervix: Normal. No cervical motion tenderness, discharge or lesion.     Uterus: Normal. Not enlarged and not tender.      Adnexa: Right adnexa normal and left adnexa normal.  Musculoskeletal:        General: Normal range of motion.     Cervical back: Normal range of motion.  Neurological:     General: No focal deficit present.     Mental Status: She is alert.  Psychiatric:        Mood and Affect: Mood normal.        Behavior: Behavior normal.     ***  ***   Assessment and Plan:        Increased endometrial stripe thickness  Cervical cancer screening -     Cytology - PAP  Cystocele with third degree uterine prolapse    Pt with Stage 2 colon cancer and stage 1 breast cancer referred by oncolongy for incidental finding of endometrial thickening on CT scan confirmed on TVUS. Patient denies postmenopausal bleeding. In an asymptomatic postmenopausal patient the criteria for endometrial biopsy would be endometrial thickness of 11 mm.   The patient does not meet this criteria therefore endometrial biopsy is not indicated.  However I did offer an endometrial biopsy if she preferred and this was declined. Pap smear was performed as well as pelvic exam during which third degree pelvic organ prolapse was noted.  Reviewed with patient that in the setting of postmenopausal vaginal bleeding an endometrial biopsy would be indicated. If bleeding occurs then she should follow-up with her office. Otherwise she can follow-up with transvaginal ultrasound in 1 year.  We also discussed pelvic organ prolapse and this is asymptomatic for the patient.  She declines any treatment options at this time.   Vera LULLA Pa, MD     [1]  Current Outpatient Medications  on File Prior to Visit  Medication Sig Dispense Refill   acetaminophen  (TYLENOL ) 325 MG tablet Take 2 tablets (650 mg total) by mouth every 6 (six) hours as needed for mild pain, moderate pain, fever or headache.     atorvastatin  (LIPITOR) 10 MG tablet TAKE 1 TABLET BY MOUTH DAILY 90 tablet 3   metoprolol  succinate (TOPROL -XL) 25 MG 24 hr tablet TAKE 1 TABLET BY MOUTH DAILY 90 tablet 3   nitroGLYCERIN  (NITROSTAT ) 0.4 MG SL tablet Place 1 tablet (0.4 mg total) under the tongue every 5 (five) minutes as needed for chest pain. 25 tablet 2   rivaroxaban  (XARELTO ) 20 MG TABS tablet Take 1 tablet (20 mg total) by mouth daily with supper. 90  tablet 3   No current facility-administered medications on file prior to visit.  [2] No Known Allergies  "

## 2024-07-05 NOTE — Progress Notes (Signed)
 Per daughter, will be unable to come in for a pre-op appointment prior to seed placement.  Left message at Dr. Milta office and sent staff message to Sari Sharps.  Notified Isaiah Ruder and sent chart to be reviewed.

## 2024-07-06 ENCOUNTER — Other Ambulatory Visit: Payer: Self-pay | Admitting: *Deleted

## 2024-07-06 DIAGNOSIS — Z170 Estrogen receptor positive status [ER+]: Secondary | ICD-10-CM

## 2024-07-06 NOTE — Progress Notes (Signed)
 Contacted by Sari w/[facility]/Dr. Vanderbilt re: Ms. Natasha. [name] has lab appt on 1/29 prior to new patient appt with Dr. Gudena. Sari stated patient needed CBC/BMP prior to seed placement on 1/30 and asked if patient would have those labs on 1/29? Myles Sari that patient will have CBC/CMP completed as part of her appt. Sari states they will check lab results thru [facility] prior to procedure with Dr. Vanderbilt.

## 2024-07-07 ENCOUNTER — Ambulatory Visit: Payer: Self-pay | Admitting: Obstetrics and Gynecology

## 2024-07-07 LAB — CYTOLOGY - PAP: Diagnosis: NEGATIVE

## 2024-07-08 ENCOUNTER — Inpatient Hospital Stay

## 2024-07-08 ENCOUNTER — Encounter (HOSPITAL_COMMUNITY): Payer: Self-pay | Admitting: Surgery

## 2024-07-08 ENCOUNTER — Inpatient Hospital Stay: Admitting: Hematology and Oncology

## 2024-07-08 ENCOUNTER — Ambulatory Visit

## 2024-07-08 ENCOUNTER — Inpatient Hospital Stay: Attending: Physician Assistant | Admitting: Hematology and Oncology

## 2024-07-08 ENCOUNTER — Encounter: Payer: Self-pay | Admitting: *Deleted

## 2024-07-08 VITALS — BP 124/88 | HR 98 | Temp 97.6°F | Resp 18 | Ht 60.25 in | Wt 200.7 lb

## 2024-07-08 DIAGNOSIS — C50411 Malignant neoplasm of upper-outer quadrant of right female breast: Secondary | ICD-10-CM | POA: Diagnosis not present

## 2024-07-08 DIAGNOSIS — Z170 Estrogen receptor positive status [ER+]: Secondary | ICD-10-CM | POA: Diagnosis not present

## 2024-07-08 LAB — CBC WITH DIFFERENTIAL (CANCER CENTER ONLY)
Abs Immature Granulocytes: 0.03 10*3/uL (ref 0.00–0.07)
Basophils Absolute: 0 10*3/uL (ref 0.0–0.1)
Basophils Relative: 1 %
Eosinophils Absolute: 0.1 10*3/uL (ref 0.0–0.5)
Eosinophils Relative: 2 %
HCT: 50.1 % — ABNORMAL HIGH (ref 36.0–46.0)
Hemoglobin: 16.8 g/dL — ABNORMAL HIGH (ref 12.0–15.0)
Immature Granulocytes: 1 %
Lymphocytes Relative: 25 %
Lymphs Abs: 1.4 10*3/uL (ref 0.7–4.0)
MCH: 30.4 pg (ref 26.0–34.0)
MCHC: 33.5 g/dL (ref 30.0–36.0)
MCV: 90.8 fL (ref 80.0–100.0)
Monocytes Absolute: 0.4 10*3/uL (ref 0.1–1.0)
Monocytes Relative: 6 %
Neutro Abs: 3.8 10*3/uL (ref 1.7–7.7)
Neutrophils Relative %: 65 %
Platelet Count: 204 10*3/uL (ref 150–400)
RBC: 5.52 MIL/uL — ABNORMAL HIGH (ref 3.87–5.11)
RDW: 15.5 % (ref 11.5–15.5)
WBC Count: 5.7 10*3/uL (ref 4.0–10.5)
nRBC: 0 % (ref 0.0–0.2)

## 2024-07-08 LAB — CMP (CANCER CENTER ONLY)
ALT: 10 U/L (ref 0–44)
AST: 23 U/L (ref 15–41)
Albumin: 4.3 g/dL (ref 3.5–5.0)
Alkaline Phosphatase: 125 U/L (ref 38–126)
Anion gap: 15 (ref 5–15)
BUN: 7 mg/dL — ABNORMAL LOW (ref 8–23)
CO2: 22 mmol/L (ref 22–32)
Calcium: 9.6 mg/dL (ref 8.9–10.3)
Chloride: 99 mmol/L (ref 98–111)
Creatinine: 1.01 mg/dL — ABNORMAL HIGH (ref 0.44–1.00)
GFR, Estimated: 57 mL/min — ABNORMAL LOW
Glucose, Bld: 87 mg/dL (ref 70–99)
Potassium: 4.1 mmol/L (ref 3.5–5.1)
Sodium: 136 mmol/L (ref 135–145)
Total Bilirubin: 1.8 mg/dL — ABNORMAL HIGH (ref 0.0–1.2)
Total Protein: 8.3 g/dL — ABNORMAL HIGH (ref 6.5–8.1)

## 2024-07-08 NOTE — Progress Notes (Addendum)
 Anesthesia Chart Review: SAME DAY WORK-UP   Case: [id number] Date/Time: 07/13/24 1520   Procedure: BREAST LUMPECTOMY WITH RADIOACTIVE SEED LOCALIZATION (Right: Breast) - RIGHT BREAST RADIOACTIVE SEED LUMPECTOMY   Anesthesia type: General   Pre-op diagnosis: RIGHT BREAST CANCER   [facility] / [facility]   Surgeons: Vanderbilt Ned, MD       DISCUSSION: Patient is a 79 year old female scheduled for the above procedure.  She was unable to come to her PAT visit due to inclement weather, so lab draw arranged through [facility] on 07/08/2024 appointment with Dr. Odean.  History includes never smoker, breast cancer (left mastectomy with reconstruction 06/05/2000; right breast cancer 05/2024), tachycardia induced nonischemic cardiomyopathy (02/2013), chronic combined systolic and diastolic CHF, afib (DCCV 02/15/2013 & 02/22/2013 without sustained SR->persistent afib), CKD, colon cancer (s/p right hemicolectomy 06/04/2022), DVT (right axillary vain and right brachial DVT, right basilic SVT 06/01/2022).   Diagnosed with afib in 02/2013. No sustained SR after DCCV x2.  LVEF 30-35% with global LV hypokinesis by TTE then, and no ischemia by nuclear stress test. Cardiomyopathy attributed to tachycardia. LVEF improved to 50-55% in 2015 but decreased to 45-50% in 2016. She had Afib with RVR in 05/2022 in setting of anemia with finding of obstructing cecal mass. She required metoprolol  and amiodarone . Colonoscopy revealed colon adenocarcinoma. Anticoagulation had been held due to worsening anemia, but 06/01/2022 US  showed right axillary and brachial DVT, so Lovenox  started. She underwent right colectomy 06/04/2022. She remains on Toprol , Lasix , and Xarelto . Last TTE 06/2021 showed LVEF 55-60%, no RWMA, normal RV systolic function, normal PASP, mild-moderate TR.   Telephonic cardiology visit on 06/25/2024 with Rana Dixon, NP, Preoperative Cardiovascular Risk Assessment: According to the Revised Cardiac Risk Index  (RCRI), her Perioperative Risk of Major Cardiac Event is (%): 0.9. Her Functional Capacity in METs is: 5.62 according to the Duke Activity Status Index (DASI).   Therefore, based on ACC/AHA guidelines, patient would be at acceptable risk for the planned procedure without further cardiovascular testing.... Per office protocol, patient can hold Xarelto  for 2 days prior to procedure.   07/08/2024 labs from [facility] reviewed. Total bilirubin elevated at 1.8 with normal AST and ALT. Cr 1.01, glucose 87, H/H 16.8/50.1 (previously 15.3/46.5), PLT 204K. Oncology can continue to follow.   Anesthesia team to evaluate on the day of surgery. She is for RSL placement on 07/09/2024.   UPDATE 07/09/2024 9:30 AM: Sari at [facility] said she would contact patient's daughter to confirm they were aware of Xarelto  instructions. Our PAT RN staff will also contact patient prior to surgery to complete preoperative instructions.  VS:  Wt Readings from Last 3 Encounters:  07/08/24 91 kg  07/01/24 89.4 kg  05/28/24 92.2 kg   BP Readings from Last 3 Encounters:  07/08/24 124/88  07/01/24 100/64  05/28/24 (!) 113/103   Pulse Readings from Last 3 Encounters:  07/08/24 98  07/01/24 98  05/28/24 87     PROVIDERS: Cleatus Arlyss RAMAN, MD is PCP Odean Potts, MD (breast cancer) and Federico Rush, IV (colon cancer) are HEM-ONC Jeffrie Anes, MD is cardiologist  [place], DO is GI  LABS: Most recent lab results in [facility] include: Lab Results  Component Value Date   WBC 5.7 07/08/2024   HGB 16.8 (H) 07/08/2024   HCT 50.1 (H) 07/08/2024   PLT 204 07/08/2024   GLUCOSE 87 07/08/2024   ALT 10 07/08/2024   AST 23 07/08/2024   NA 136 07/08/2024   K 4.1 07/08/2024  CL 99 07/08/2024   CREATININE 1.01 (H) 07/08/2024   BUN 7 (L) 07/08/2024   CO2 22 07/08/2024    IMAGES: CT Chest/abd/pelvis 05/14/2024: IMPRESSION: 1. Prior right hemicolectomy with right upper quadrant ileocolic anastomosis. No evidence of local  recurrence. 2. No convincing evidence of metastatic disease within the chest, abdomen or pelvis. 3. Left-sided colonic diverticulosis with short segment colonic wall thickening and stranding in the sigmoid mesentery with a fluid tract extending to the left ovary concerning for acute sigmoid colonic diverticulitis with possible entero-ovarian fistula. 4. Question endometrial thickening although evaluation is limited by streak artifact from right hip arthroplasty. Consider further evaluation with pelvic ultrasound. 5. Soft tissue nodule/lymph node in the right breast measures 13 x 12 mm previously 10 x 4 mm. Recommend correlation with mammography. 6. Cholelithiasis. 7. Aortic atherosclerosis.   Xray L-spine 08/22/2023: IMPRESSION: 1. No acute fracture or acute finding. 2. Old compression fractures of L1 and L3. 3. Degenerative changes and mild levoscoliosis as detailed.   MRI Brain 06/07/2022: IMPRESSION: 1. No acute intracranial abnormality. 2. Findings of chronic microvascular ischemia and generalized volume loss. 3. Bilateral mastoid fluid.    EKG: Atrial fibrillation with rapid ventricular response at 103 bpm Nonspecific T wave abnormality When compared with ECG of 06-May-2023 10:40, No significant change was found Confirmed by West, Katlyn [phone]) on 12/15/2023 12:16:59 PM   CV: Echo 04/16/2022: IMPRESSIONS   1. Left ventricular ejection fraction, by estimation, is 55 to 60%. The  left ventricle has normal function. The left ventricle has no regional  wall motion abnormalities. Left ventricular diastolic parameters are  indeterminate.   2. Right ventricular systolic function is normal. The right ventricular  size is normal. There is normal pulmonary artery systolic pressure.   3. Left atrial size was mildly dilated.   4. The mitral valve is normal in structure. Mild mitral valve  regurgitation. No evidence of mitral stenosis.   5. Tricuspid valve regurgitation is mild to  moderate.   6. The aortic valve is tricuspid. Aortic valve regurgitation is not  visualized. Aortic valve sclerosis is present, with no evidence of aortic  valve stenosis.   7. The inferior vena cava is normal in size with greater than 50%  respiratory variability, suggesting right atrial pressure of 3 mmHg.    Nuclear stress test 02/19/2013: Impression: Lexiscan  Sestamibi.  Electrically negative for  ischemia.  Sestamibi scan shows anterior thinning consistent with  probable soft tissue attenuation(breast) cannot rule-out  subendocardial scar.  Basal inferolateral thinning consistent with  possible soft tissue attenuation versus subendocardial scar.  No  evidence for significant ischemia.   LVEF on gating calculated at  40% with wall motion changes as noted.  Consider echo  to further  evaluate systolic function/wall motion.   Overall low risk scan.    Past Medical History:  Diagnosis Date   Breast cancer (HCC) 1999   Stage I in left breast   Chronic combined systolic and diastolic CHF (congestive heart failure) (HCC)    a. 02/2013 Echo: EF 30-35%, diff HK, biatrial enlargement. b. 2D echo 07/2014 showed mildly dilated LV, EF 45-50%, severely dilated left atrium and moderately dilated right atrium.   CKD (chronic kidney disease), stage II    Closed fracture of unspecified part of neck of femur 2011   Right.  Soreness when standing. (Dr. Anderson)   Colon cancer [facility]) 2023   s/p right hemicolectomy 06/04/2022   DVT (deep venous thrombosis) (HCC) 06/01/2022   right axillary & brachial  DVT, right basilic SVT   Nonischemic cardiomyopathy (HCC)    a. 02/2013 Lexi CL: No ischemia, prob attenuation vs scar, EF 40%-->Med Rx.   Persistent atrial fibrillation (HCC)    a. 02/2013: s/p TEE and attempted cardioversion x 2-->failed-->rate controlled with bb/digoxin , Xarelto  initiated.    Past Surgical History:  Procedure Laterality Date   BIOPSY  05/29/2022   Procedure: BIOPSY;  Surgeon:  San Sandor GAILS, DO;  [facility]: [facility];  Service: Gastroenterology;;   BREAST BIOPSY Right 06/11/2024   US  RT BREAST BX W LOC DEV 1ST LESION IMG BX SPEC US  GUIDE 06/11/2024 [facility] MAMMOGRAPHY   CARDIOVERSION N/A 02/18/2013   Procedure: CARDIOVERSION;  Surgeon: Toribio JONELLE Fuel, MD;  Location: [facility] ENDOSCOPY;  Service: Cardiovascular;  Laterality: N/A;   CARDIOVERSION N/A 02/22/2013   Procedure: CARDIOVERSION;  Surgeon: Vina GAILS Gull, MD;  Location: [facility] ENDOSCOPY;  Service: Cardiovascular;  Laterality: N/A;   CHOLECYSTECTOMY Right 06/04/2022   Procedure: LAPAROSCOPIC HAND ASSISTED RIGHT COLECTOMY;  Surgeon: Lyndel Deward PARAS, MD;  Location: [facility] OR;  Service: General;  Laterality: Right;   COLONOSCOPY WITH PROPOFOL  N/A 05/29/2022   Procedure: COLONOSCOPY WITH PROPOFOL ;  Surgeon: San Sandor GAILS, DO;  [facility];  Service: Gastroenterology;  Laterality: N/A;   ESOPHAGOGASTRODUODENOSCOPY (EGD) WITH PROPOFOL  N/A 05/29/2022   Procedure: ESOPHAGOGASTRODUODENOSCOPY (EGD) WITH PROPOFOL ;  Surgeon: San Sandor GAILS, DO;  Location: [facility] ENDOS[facility];  Service: Gastroenterology;  Laterality: N/A;   IR FLUORO GUIDE CV LINE RIGHT  06/02/2022   IR US  GUIDE VASC ACCESS RIGHT  06/02/2022   MASTECTOMY  06/10/1997   Left breast with saline implant.   PARTIAL HIP ARTHROPLASTY Right    POLYPECTOMY  05/29/2022   Procedure: POLYPECTOMY;  Surgeon: San Sandor GAILS, DO;  [facility];  Service: Gastroenterology;;   SUBMUCOSAL TATTOO INJECTION  05/29/2022   Procedure: SUBMUCOSAL TATTOO INJECTION;  Surgeon: San Sandor GAILS, DO;  [facility];  Service: Gastroenterology;;   TEE WITHOUT CARDIOVERSION N/A 02/18/2013   Procedure: TRANSESOPHAGEAL ECHOCARDIOGRAM (TEE);  Surgeon: Toribio JONELLE Fuel, MD;  Location: [facility] ENDOSCOPY;  Service: Cardiovascular;  Laterality: N/A;   TUBAL LIGATION      MEDICATIONS:  acetaminophen  (TYLENOL ) 325 MG tablet   atorvastatin  (LIPITOR) 10 MG tablet    furosemide  (LASIX ) 20 MG tablet   metoprolol  succinate (TOPROL -XL) 25 MG 24 hr tablet   nitroGLYCERIN  (NITROSTAT ) 0.4 MG SL tablet   potassium chloride  (KLOR-CON ) 10 MEQ tablet   rivaroxaban  (XARELTO ) 20 MG TABS tablet    Isaiah Ruder, PA-C Surgical Short Stay/Anesthesiology [facility] Phone [phone] [facility] Phone [phone] 07/08/2024 5:42 PM

## 2024-07-08 NOTE — Progress Notes (Signed)
 Met with patient and her daughter Richardson at first Med Onc appt with Dr. Odean. This navigator had spoken to daughter in early January. She told me at that time her mom gets overwhelmed with too much information and does best with one decision at a time. Has some dementia per daughter.  Gave navigation contact information and Journey notebook. Daughter Richardson had a question if she needed to stop Xarelto  for seed placement in am. Florence Mliss Molt RN at [facility] and she checked with Dr. Katha who said no need to hold it. Dr. Vanderbilt had instructed the daughter/patient when to hold the Xarelto  for pre op.  Patient wasn't sure if wanted to do genetics or not. Daughter is interested and I told them to think about about it can call back if choose have genetic testing.

## 2024-07-08 NOTE — Assessment & Plan Note (Addendum)
 05/31/2024: History of left mastectomy with reconstruction, CT chest detected right breast mass screening mammogram revealed 1.6 cm right breast mass and 2.4 cm calcifications, biopsy: Grade 2 IDC ER 99%, PR 60%, Ki67 15%, HER2 1+ by IHC  (05/29/2022: Colon cancer T3 N0 M0 stage II status post right hemicolectomy: No adjuvant therapy follows with Dr. Federico)  Pathology and radiology counseling:Discussed with the patient, the details of pathology including the type of breast cancer,the clinical staging, the significance of ER, PR and HER-2/neu receptors and the implications for treatment. After reviewing the pathology in detail, we proceeded to discuss the different treatment options between surgery, radiation, chemotherapy, antiestrogen therapies.  Recommendations: 1. Breast conserving surgery followed by 2. Oncotype DX testing to determine if chemotherapy would be of any benefit followed by 3. Adjuvant radiation therapy followed by 4. Adjuvant antiestrogen therapy  Oncotype counseling: I discussed Oncotype DX test. I explained to the patient that this is a 21 gene panel to evaluate patient tumors DNA to calculate recurrence score. This would help determine whether patient has high risk or low risk breast cancer. She understands that if her tumor was found to be high risk, she would benefit from systemic chemotherapy. If low risk, no need of chemotherapy.  Return to clinic after surgery to discuss final pathology report and then determine if Oncotype DX testing will need to be sent.

## 2024-07-08 NOTE — Progress Notes (Signed)
 [facility] CONSULT NOTE  Patient Care Team: Cleatus Arlyss RAMAN, MD as PCP - General (Family Medicine) Jeffrie Oneil BROCKS, MD as PCP - Cardiology (Cardiology) Gerome Devere HERO, RN as Oncology Nurse Navigator  CHIEF COMPLAINTS/PURPOSE OF CONSULTATION:  Newly diagnosed breast cancer  HISTORY OF PRESENTING ILLNESS:  History of Present Illness Natasha Dickson is a 79 year old female with prior left breast cancer who presents for management of newly diagnosed right breast invasive ductal carcinoma.  Recent CT incidentally identified a right breast mass, further evaluated with mammogram, ultrasound, and biopsy confirming a 1.6 cm invasive ductal carcinoma, ER 99% positive, PR 60% positive, HER2 negative, grade 2, Ki-67 15%, with no lymphadenopathy on imaging. She denies breast pain, skin changes, nipple discharge, or other local symptoms.  She is concerned about procedural pain and has not yet met with radiation oncology.  She previously underwent left mastectomy for breast cancer and completed five years of tamoxifen. She had prolonged estrogen replacement therapy during menopause and prior oral contraceptive use. She has not had genetic testing. This was discussed and recommended given bilateral breast cancer.  Recent CT also showed endometrial thickening of 9 mm and a possible fluid tract from the colon to the left ovary. Gynecology evaluation with pelvic exam and Pap smear did not lead to biopsy recommendation. She has no gynecologic symptoms and reports healing well after prior colon cancer intervention.  She has increased difficulty with mobility and joint stiffness and now needs her daughter's assistance with daily activities.     I reviewed her records extensively and collaborated the history with the patient.  SUMMARY OF ONCOLOGIC HISTORY: Oncology History  Colon cancer (HCC)  07/03/2022 Initial Diagnosis   Colon cancer (HCC)   07/03/2022 Cancer Staging   Staging form:  Colon and Rectum, AJCC 8th Edition - Clinical: Stage IIA (cT3, cN0, cM0) - Signed by Federico Norleen ONEIDA MADISON, MD on 07/03/2022 Total positive nodes: 0      MEDICAL HISTORY:  Past Medical History:  Diagnosis Date   Breast cancer (HCC) 1999   Stage I in left breast   Chronic combined systolic and diastolic CHF (congestive heart failure) (HCC)    a. 02/2013 Echo: EF 30-35%, diff HK, biatrial enlargement. b. 2D echo 07/2014 showed mildly dilated LV, EF 45-50%, severely dilated left atrium and moderately dilated right atrium.   CKD (chronic kidney disease), stage II    Closed fracture of unspecified part of neck of femur 2011   Right.  Soreness when standing. (Dr. Anderson)   Colon cancer [facility]) 2023   s/p 05/2022 removed colon cancer   Nonischemic cardiomyopathy (HCC)    a. 02/2013 [facility]: No ischemia, prob attenuation vs scar, EF 40%-->Med Rx.   Persistent atrial fibrillation (HCC)    a. 02/2013: s/p TEE and attempted cardioversion x 2-->failed-->rate controlled with bb/digoxin , Xarelto  initiated.    SURGICAL HISTORY: Past Surgical History:  Procedure Laterality Date   BIOPSY  05/29/2022   Procedure: BIOPSY;  Surgeon: San Sandor GAILS, DO;  Location: [facility] ENDOSCOPY;  Service: Gastroenterology;;   BREAST BIOPSY Right 06/11/2024   US  RT BREAST BX W LOC DEV 1ST LESION IMG BX SPEC US  GUIDE 06/11/2024 [facility] MAMMOGRAPHY   CARDIOVERSION N/A 02/18/2013   Procedure: CARDIOVERSION;  Surgeon: Toribio JONELLE Fuel, MD;  Location: [facility] ENDOSCOPY;  Service: Cardiovascular;  Laterality: N/A;   CARDIOVERSION N/A 02/22/2013   Procedure: CARDIOVERSION;  Surgeon: Vina GAILS Gull, MD;  Location: [facility] ENDOSCOPY;  Service: Cardiovascular;  Laterality: N/A;  CHOLECYSTECTOMY Right 06/04/2022   Procedure: LAPAROSCOPIC HAND ASSISTED RIGHT COLECTOMY;  Surgeon: Lyndel Deward PARAS, MD;  Location: [facility] OR;  Service: General;  Laterality: Right;   COLONOSCOPY WITH PROPOFOL  N/A 05/29/2022   Procedure: COLONOSCOPY WITH PROPOFOL ;   Surgeon: San Sandor GAILS, DO;  Location: [facility] ENDOSCOPY;  Service: Gastroenterology;  Laterality: N/A;   ESOPHAGOGASTRODUODENOSCOPY (EGD) WITH PROPOFOL  N/A 05/29/2022   Procedure: ESOPHAGOGASTRODUODENOSCOPY (EGD) WITH PROPOFOL ;  Surgeon: San Sandor GAILS, DO;  Location: [facility] ENDOSCOPY;  Service: Gastroenterology;  Laterality: N/A;   IR FLUORO GUIDE CV LINE RIGHT  06/02/2022   IR US  GUIDE VASC ACCESS RIGHT  06/02/2022   MASTECTOMY  06/10/1997   Left breast with saline implant.   PARTIAL HIP ARTHROPLASTY Right    POLYPECTOMY  05/29/2022   Procedure: POLYPECTOMY;  Surgeon: San Sandor GAILS, DO;  Location: [facility] ENDOSCOPY;  Service: Gastroenterology;;   SUBMUCOSAL TATTOO INJECTION  05/29/2022   Procedure: SUBMUCOSAL TATTOO INJECTION;  Surgeon: San Sandor GAILS, DO;  Location: [facility] ENDOSCOPY;  Service: Gastroenterology;;   TEE WITHOUT CARDIOVERSION N/A 02/18/2013   Procedure: TRANSESOPHAGEAL ECHOCARDIOGRAM (TEE);  Surgeon: Toribio JONELLE Fuel, MD;  Location: [facility] ENDOSCOPY;  Service: Cardiovascular;  Laterality: N/A;   TUBAL LIGATION      SOCIAL HISTORY: Social History   Socioeconomic History   Marital status: Married    Spouse name: Not on file   Number of children: Not on file   Years of education: Not on file   Highest education level: Not on file  Occupational History   Occupation: Conservation Officer, Nature    Employer: [employer]  Tobacco Use   Smoking status: Never   Smokeless tobacco: Never  Vaping Use   Vaping status: Never Used  Substance and Sexual Activity   Alcohol use: Yes    Alcohol/week: 0.0 standard drinks of alcohol    Comment: RARE   Drug use: No   Sexual activity: Never  Other Topics Concern   Not on file  Social History Narrative   High School grad.   Married 40+ years   Conservation Officer, Nature at [employer]   Social Drivers of Health   Tobacco Use: Low Risk (07/01/2024)   Patient History    Smoking Tobacco Use: Never    Smokeless Tobacco Use: Never    Passive Exposure: Not on  file  Financial Resource Strain: Low Risk (11/07/2022)   Overall Financial Resource Strain (CARDIA)    Difficulty of Paying Living Expenses: Not hard at all  Food Insecurity: No Food Insecurity (07/08/2024)   Epic    Worried About Programme Researcher, Broadcasting/film/video in the Last Year: Never true    Ran Out of Food in the Last Year: Never true  Transportation Needs: No Transportation Needs (07/08/2024)   Epic    Lack of Transportation (Medical): No    Lack of Transportation (Non-Medical): No  Physical Activity: Inactive (11/07/2022)   Exercise Vital Sign    Days of Exercise per Week: 0 days    Minutes of Exercise per Session: 0 min  Stress: No Stress Concern Present (11/07/2022)   [employer] of Occupational Health - Occupational Stress Questionnaire    Feeling of Stress : Not at all  Social Connections: Moderately Isolated (11/07/2022)   Social Connection and Isolation Panel    Frequency of Communication with Friends and Family: More than three times a week    Frequency of Social Gatherings with Friends and Family: More than three times a week    Attends Religious Services: Never  Active Member of Clubs or Organizations: No    Attends Banker Meetings: Never    Marital Status: Married  Catering Manager Violence: Not At Risk (11/07/2022)   Humiliation, Afraid, Rape, and Kick questionnaire    Fear of Current or Ex-Partner: No    Emotionally Abused: No    Physically Abused: No    Sexually Abused: No  Depression (PHQ2-9): High Risk (08/22/2023)   Depression (PHQ2-9)    PHQ-2 Score: 12  Alcohol Screen: Low Risk (11/07/2022)   Alcohol Screen    Last Alcohol Screening Score (AUDIT): 0  Housing: Unknown (07/08/2024)   Epic    Unable to Pay for Housing in the Last Year: No    Number of Times Moved in the Last Year: Not on file    Homeless in the Last Year: No  Utilities: Not At Risk (07/08/2024)   Epic    Threatened with loss of utilities: No  Health Literacy: Not on file    FAMILY  HISTORY: Family History  Problem Relation Age of Onset   COPD Mother        from long-term smoking   Colon polyps Father    Colon cancer Father    Heart disease Father    Heart attack Father    Hypertension Father    Thyroid  cancer Father    Heart disease Brother    Congenital heart disease Son         transposition of great vessels- corrected with  MI  as an adult   Stroke Neg Hx    Esophageal cancer Neg Hx    Rectal cancer Neg Hx    Stomach cancer Neg Hx    Breast cancer Neg Hx     ALLERGIES:  has no known allergies.  MEDICATIONS:  Current Outpatient Medications  Medication Sig Dispense Refill   acetaminophen  (TYLENOL ) 325 MG tablet Take 2 tablets (650 mg total) by mouth every 6 (six) hours as needed for mild pain, moderate pain, fever or headache.     atorvastatin  (LIPITOR) 10 MG tablet TAKE 1 TABLET BY MOUTH DAILY 90 tablet 3   furosemide  (LASIX ) 20 MG tablet Take 10 mg by mouth daily as needed for fluid or edema.     metoprolol  succinate (TOPROL -XL) 25 MG 24 hr tablet TAKE 1 TABLET BY MOUTH DAILY 90 tablet 3   nitroGLYCERIN  (NITROSTAT ) 0.4 MG SL tablet Place 1 tablet (0.4 mg total) under the tongue every 5 (five) minutes as needed for chest pain. 25 tablet 2   potassium chloride  (KLOR-CON ) 10 MEQ tablet Take 10 mEq by mouth as needed (when take fursomide).     rivaroxaban  (XARELTO ) 20 MG TABS tablet Take 1 tablet (20 mg total) by mouth daily with supper. 90 tablet 3   No current facility-administered medications for this visit.    REVIEW OF SYSTEMS:   Constitutional: Denies fevers, chills or abnormal night sweats Breast:  Denies any palpable lumps or discharge All other systems were reviewed with the patient and are negative.  PHYSICAL EXAMINATION: ECOG PERFORMANCE STATUS: 2 - Symptomatic, <50% confined to bed  Vitals:   07/08/24 1547  BP: 124/88  Pulse: 98  Resp: 18  Temp: 97.6 F (36.4 C)  SpO2: 99%   Filed Weights   07/08/24 1547  Weight: 200 lb 11.2 oz  (91 kg)    GENERAL:alert, no distress and comfortable   LABORATORY DATA:  I have reviewed the data as listed Lab Results  Component Value Date  WBC 5.7 07/08/2024   HGB 16.8 (H) 07/08/2024   HCT 50.1 (H) 07/08/2024   MCV 90.8 07/08/2024   PLT 204 07/08/2024   Lab Results  Component Value Date   NA 136 07/08/2024   K 4.1 07/08/2024   CL 99 07/08/2024   CO2 22 07/08/2024    RADIOGRAPHIC STUDIES: I have personally reviewed the radiological reports and agreed with the findings in the report.  ASSESSMENT AND PLAN:  Malignant neoplasm of upper-outer quadrant of right breast in female, estrogen receptor positive (HCC) 05/31/2024: History of left mastectomy with reconstruction, CT chest detected right breast mass screening mammogram revealed 1.6 cm right breast mass and 2.4 cm calcifications, biopsy: Grade 2 IDC ER 99%, PR 60%, Ki67 15%, HER2 1+ by IHC  (1999: Left breast cancer treated with mastectomy and 5 years of tamoxifen  05/29/2022: Colon cancer T3 N0 M0 stage II status post right hemicolectomy: No adjuvant therapy follows with Dr. Federico)  Pathology and radiology counseling:Discussed with the patient, the details of pathology including the type of breast cancer,the clinical staging, the significance of ER, PR and HER-2/neu receptors and the implications for treatment. After reviewing the pathology in detail, we proceeded to discuss the different treatment options between surgery, radiation, chemotherapy, antiestrogen therapies.  Recommendations: 1. Breast conserving surgery followed by 2. +/- Scheduled adjuvant radiation therapy followed by 4. Adjuvant antiestrogen therapy  Recommended genetic counseling Did not recommend Oncotype DX because of her age and performance status.  All questions were answered. The patient knows to call the clinic with any problems, questions or concerns. I personally spent a total of 30 minutes in the care of the patient today including  preparing to see the patient, getting/reviewing separately obtained history, performing a medically appropriate exam/evaluation, counseling and educating, placing orders, referring and communicating with other health care professionals, documenting clinical information in the EHR, independently interpreting results, communicating results, and coordinating care.   Viinay K Aunisty Reali, MD 07/08/24

## 2024-07-08 NOTE — Anesthesia Preprocedure Evaluation (Addendum)
 "                                  Anesthesia Evaluation  Patient identified by MRN, date of birth, ID band Patient awake    Reviewed: Allergy & Precautions, H&P , NPO status , Patient's Chart, lab work & pertinent test results, reviewed documented beta blocker date and time   History of Anesthesia Complications Negative for: history of anesthetic complications  Airway Mallampati: II  TM Distance: >3 FB Neck ROM: Full    Dental  (+) Teeth Intact, Dental Advisory Given   Pulmonary neg sleep apnea, neg COPD   Pulmonary exam normal breath sounds clear to auscultation       Cardiovascular hypertension, Pt. on medications and Pt. on home beta blockers Atrial Fibrillation  Rhythm:Regular Rate:Tachycardia  Hx/o NICM Chronic persistent Atrial fibrillation DVT right UE at PICC line insertion, Thrombophlebitis LUE  Echo: 04/2022 1. Left ventricular ejection fraction, by estimation, is 55 to 60%. The  left ventricle has normal function. The left ventricle has no regional  wall motion abnormalities. Left ventricular diastolic parameters are  indeterminate.   2. Right ventricular systolic function is normal. The right ventricular  size is normal. There is normal pulmonary artery systolic pressure.   3. Left atrial size was mildly dilated.   4. The mitral valve is normal in structure. Mild mitral valve  regurgitation. No evidence of mitral stenosis.   5. Tricuspid valve regurgitation is mild to moderate.   6. The aortic valve is tricuspid. Aortic valve regurgitation is not  visualized. Aortic valve sclerosis is present, with no evidence of aortic  valve stenosis.   7. The inferior vena cava is normal in size with greater than 50%  respiratory variability, suggesting right atrial pressure of 3 mmHg.     Neuro/Psych neg Seizures  negative psych ROS   GI/Hepatic Neg liver ROS, hiatal hernia,,,  Endo/Other  Obesity Hyperlipidemia Hx/o Breast Ca  Renal/GU negative  Renal ROS  negative genitourinary   Musculoskeletal  (+) Arthritis ,    Abdominal  (+) + obese  Peds negative pediatric ROS (+)  Hematology   Anesthesia Other Findings   Reproductive/Obstetrics Right sided breast cancer                              Anesthesia Physical Anesthesia Plan  ASA: 3  Anesthesia Plan: General   Post-op Pain Management: Minimal or no pain anticipated   Induction: Intravenous  PONV Risk Score and Plan: 3 and Ondansetron , Dexamethasone  and Treatment may vary due to age or medical condition  Airway Management Planned: LMA  Additional Equipment: None  Intra-op Plan:   Post-operative Plan: Extubation in OR  Informed Consent: I have reviewed the patients History and Physical, chart, labs and discussed the procedure including the risks, benefits and alternatives for the proposed anesthesia with the patient or authorized representative who has indicated his/her understanding and acceptance.       Plan Discussed with: CRNA  Anesthesia Plan Comments: (PAT note written 07/08/2024 by Allison Zelenak, PA-C.   Patient is a 79 year old female scheduled for the above procedure.  She was unable to come to her PAT visit due to inclement weather, so lab draw arranged through [facility] on 07/08/2024 appointment with Dr. Odean.   History includes never smoker, breast cancer (left mastectomy with reconstruction 06/05/2000; right breast cancer 05/2024),  tachycardia induced nonischemic cardiomyopathy (02/2013), chronic combined systolic and diastolic CHF, afib (DCCV 02/15/2013 & 02/22/2013 without sustained SR->persistent afib), CKD, colon cancer (s/p right hemicolectomy 06/04/2022), DVT (right axillary vain and right brachial DVT, right basilic SVT 06/01/2022).    Diagnosed with afib in 02/2013. No sustained SR after DCCV x2.  LVEF 30-35% with global LV hypokinesis by TTE then, and no ischemia by nuclear stress test. Cardiomyopathy attributed to  tachycardia. LVEF improved to 50-55% in 2015 but decreased to 45-50% in 2016. She had Afib with RVR in 05/2022 in setting of anemia with finding of obstructing cecal mass. She required metoprolol  and amiodarone . Colonoscopy revealed colon adenocarcinoma. Anticoagulation had been held due to worsening anemia, but 06/01/2022 US  showed right axillary and brachial DVT, so Lovenox  started. She underwent right colectomy 06/04/2022. She remains on Toprol , Lasix , and Xarelto . Last TTE 06/2021 showed LVEF 55-60%, no RWMA, normal RV systolic function, normal PASP, mild-moderate TR.    Telephonic cardiology visit on 06/25/2024 with Rana Dixon, NP, Preoperative Cardiovascular Risk Assessment: According to the Revised Cardiac Risk Index (RCRI), her Perioperative Risk of Major Cardiac Event is (%): 0.9. Her Functional Capacity in METs is: 5.62 according to the Duke Activity Status Index (DASI).   Therefore, based on ACC/AHA guidelines, patient would be at acceptable risk for the planned procedure without further cardiovascular testing.... Per office protocol, patient can hold Xarelto  for 2 days prior to procedure.    07/08/2024 labs from [facility] reviewed. Total bilirubin elevated at 1.8 with normal AST and ALT. Cr 1.01, glucose 87, H/H 16.8/50.1 (previously 15.3/46.5), PLT 204K. Oncology can continue to follow.    Anesthesia team to evaluate on the day of surgery. She is for RSL placement on 07/09/2024.    UPDATE 07/09/2024 9:30 AM: Sari at [facility] said she would contact patient's daughter to confirm they were aware of Xarelto  instructions. Our PAT RN staff will also contact patient prior to surgery to complete preoperative instructions. )         Anesthesia Quick Evaluation  "

## 2024-07-09 ENCOUNTER — Other Ambulatory Visit: Payer: Self-pay

## 2024-07-09 ENCOUNTER — Ambulatory Visit
Admission: RE | Admit: 2024-07-09 | Discharge: 2024-07-09 | Disposition: A | Source: Ambulatory Visit | Attending: Surgery | Admitting: Surgery

## 2024-07-09 ENCOUNTER — Other Ambulatory Visit: Payer: Self-pay | Admitting: Surgery

## 2024-07-09 DIAGNOSIS — C50911 Malignant neoplasm of unspecified site of right female breast: Secondary | ICD-10-CM

## 2024-07-09 DIAGNOSIS — Z853 Personal history of malignant neoplasm of breast: Secondary | ICD-10-CM

## 2024-07-09 NOTE — Progress Notes (Signed)
 PCP - Dr Arlyss Solian Cardiologist - Dr Oneil Parchment Gastro - Sandor Flatter, DO Oncology (Breast) - Dr Mackey Chad Oncology (Colon) - Dr Norleen Kidney  CT Chest x-ray - 05/14/24 EKG - 12/15/23 Stress Test - 02/18/13 ECHO - 04/16/22 Cardiac Cath - n/a  ICD Pacemaker/Loop - n/a  Sleep Study -  n/a  Diabetes - n/a  Xarelto  Instructions:  Hold Xarelto  for 2 days prior to procedure.  Last dose on Saturday, 07/10/24.   Aspirin  Instructions: n/a  ERAS - clear liquids til 12:30 PM DOS.  Anesthesia review: Yes, Isaiah, PA  STOP now taking any Aspirin  (unless otherwise instructed by your surgeon), Aleve, Naproxen, Ibuprofen, Motrin, Advil, Goody's, BC's, all herbal medications, fish oil, and all vitamins.   Coronavirus Screening Does the patient have any of the following symptoms:  Cough yes/no: No Fever (>100.49F)  yes/no: No Runny nose yes/no: No Sore throat yes/no: No Difficulty breathing/shortness of breath  yes/no: No  Has the patient traveled in the last 14 days and where? yes/no: No  Patient's Daughter Martiza Speth verbalized understanding of instructions that were given via phone.

## 2024-07-11 ENCOUNTER — Encounter: Payer: Self-pay | Admitting: *Deleted

## 2024-07-12 ENCOUNTER — Encounter: Payer: Self-pay | Admitting: *Deleted

## 2024-07-12 DIAGNOSIS — Z170 Estrogen receptor positive status [ER+]: Secondary | ICD-10-CM

## 2024-07-12 NOTE — Progress Notes (Unsigned)
 Received order from Dr. Gudena for Rad Onc referral. Order placed.

## 2024-07-13 ENCOUNTER — Other Ambulatory Visit: Payer: Self-pay

## 2024-07-13 ENCOUNTER — Ambulatory Visit (HOSPITAL_COMMUNITY): Admission: RE | Admit: 2024-07-13 | Source: Home / Self Care | Admitting: Surgery

## 2024-07-13 ENCOUNTER — Ambulatory Visit
Admission: RE | Admit: 2024-07-13 | Discharge: 2024-07-13 | Disposition: A | Source: Ambulatory Visit | Attending: Surgery | Admitting: Surgery

## 2024-07-13 ENCOUNTER — Ambulatory Visit (HOSPITAL_COMMUNITY): Payer: Self-pay | Admitting: Vascular Surgery

## 2024-07-13 ENCOUNTER — Encounter (HOSPITAL_COMMUNITY): Payer: Self-pay | Admitting: Surgery

## 2024-07-13 ENCOUNTER — Encounter (HOSPITAL_COMMUNITY)
Admission: RE | Disposition: A | Discharge status: Home / Self Care | Payer: Self-pay | Source: Home / Self Care | Attending: Surgery

## 2024-07-13 DIAGNOSIS — I4819 Other persistent atrial fibrillation: Secondary | ICD-10-CM | POA: Insufficient documentation

## 2024-07-13 DIAGNOSIS — Z170 Estrogen receptor positive status [ER+]: Secondary | ICD-10-CM | POA: Insufficient documentation

## 2024-07-13 DIAGNOSIS — Z85038 Personal history of other malignant neoplasm of large intestine: Secondary | ICD-10-CM | POA: Insufficient documentation

## 2024-07-13 DIAGNOSIS — N189 Chronic kidney disease, unspecified: Secondary | ICD-10-CM | POA: Insufficient documentation

## 2024-07-13 DIAGNOSIS — Z01818 Encounter for other preprocedural examination: Secondary | ICD-10-CM

## 2024-07-13 DIAGNOSIS — I4891 Unspecified atrial fibrillation: Secondary | ICD-10-CM | POA: Insufficient documentation

## 2024-07-13 DIAGNOSIS — Z9049 Acquired absence of other specified parts of digestive tract: Secondary | ICD-10-CM | POA: Insufficient documentation

## 2024-07-13 DIAGNOSIS — Z1732 Human epidermal growth factor receptor 2 negative status: Secondary | ICD-10-CM | POA: Insufficient documentation

## 2024-07-13 DIAGNOSIS — C50911 Malignant neoplasm of unspecified site of right female breast: Secondary | ICD-10-CM

## 2024-07-13 DIAGNOSIS — Z1721 Progesterone receptor positive status: Secondary | ICD-10-CM | POA: Insufficient documentation

## 2024-07-13 DIAGNOSIS — I5042 Chronic combined systolic (congestive) and diastolic (congestive) heart failure: Secondary | ICD-10-CM | POA: Insufficient documentation

## 2024-07-13 DIAGNOSIS — Z7901 Long term (current) use of anticoagulants: Secondary | ICD-10-CM | POA: Insufficient documentation

## 2024-07-13 DIAGNOSIS — Z9012 Acquired absence of left breast and nipple: Secondary | ICD-10-CM | POA: Insufficient documentation

## 2024-07-13 DIAGNOSIS — I130 Hypertensive heart and chronic kidney disease with heart failure and stage 1 through stage 4 chronic kidney disease, or unspecified chronic kidney disease: Secondary | ICD-10-CM | POA: Insufficient documentation

## 2024-07-13 DIAGNOSIS — I428 Other cardiomyopathies: Secondary | ICD-10-CM | POA: Insufficient documentation

## 2024-07-13 DIAGNOSIS — C50411 Malignant neoplasm of upper-outer quadrant of right female breast: Secondary | ICD-10-CM | POA: Insufficient documentation

## 2024-07-13 HISTORY — DX: Essential (primary) hypertension: I10

## 2024-07-13 HISTORY — DX: Dependence on other enabling machines and devices: Z99.89

## 2024-07-13 HISTORY — DX: Dyspnea, unspecified: R06.00

## 2024-07-13 HISTORY — DX: Personal history of other medical treatment: Z92.89

## 2024-07-13 HISTORY — DX: Pneumonia, unspecified organism: J18.9

## 2024-07-13 HISTORY — DX: Unspecified malignant neoplasm of skin, unspecified: C44.90

## 2024-07-13 MED ORDER — CHLORHEXIDINE GLUCONATE CLOTH 2 % EX PADS: 6.0000 | MEDICATED_PAD | Freq: Once | CUTANEOUS | Status: DC

## 2024-07-13 MED ORDER — OXYCODONE HCL 5 MG PO TABS: 5.0000 mg | ORAL_TABLET | Freq: Four times a day (QID) | ORAL | 0 refills | Status: AC | PRN

## 2024-07-13 MED ORDER — LACTATED RINGERS IV SOLN: INTRAVENOUS | Status: DC

## 2024-07-13 MED ORDER — PROPOFOL 10 MG/ML IV BOLUS
INTRAVENOUS | Status: AC
  Filled 2024-07-13: qty 20

## 2024-07-13 MED ORDER — METOPROLOL TARTRATE 5 MG/5ML IV SOLN
INTRAVENOUS | Status: DC | PRN
  Administered 2024-07-13: 2 mg via INTRAVENOUS
  Administered 2024-07-13: 1 mg via INTRAVENOUS

## 2024-07-13 MED ORDER — DEXAMETHASONE SOD PHOSPHATE PF 10 MG/ML IJ SOLN
INTRAMUSCULAR | Status: AC
  Filled 2024-07-13: qty 1

## 2024-07-13 MED ORDER — ACETAMINOPHEN 10 MG/ML IV SOLN: 1000.0000 mg | Freq: Once | INTRAVENOUS | Status: DC | PRN

## 2024-07-13 MED ORDER — ONDANSETRON HCL 4 MG/2ML IJ SOLN
INTRAMUSCULAR | Status: DC | PRN
  Administered 2024-07-13: 4 mg via INTRAVENOUS

## 2024-07-13 MED ORDER — MIDAZOLAM HCL (PF) 2 MG/2ML IJ SOLN
INTRAMUSCULAR | Status: DC | PRN
  Administered 2024-07-13: 2 mg via INTRAVENOUS

## 2024-07-13 MED ORDER — LIDOCAINE 2% (20 MG/ML) 5 ML SYRINGE
INTRAMUSCULAR | Status: AC
  Filled 2024-07-13: qty 5

## 2024-07-13 MED ORDER — MIDAZOLAM HCL 2 MG/2ML IJ SOLN
INTRAMUSCULAR | Status: AC
  Filled 2024-07-13: qty 2

## 2024-07-13 MED ORDER — OXYCODONE HCL 5 MG/5ML PO SOLN: 5.0000 mg | Freq: Once | ORAL | Status: DC | PRN

## 2024-07-13 MED ORDER — CEFAZOLIN SODIUM-DEXTROSE 2-4 GM/100ML-% IV SOLN
2.0000 g | INTRAVENOUS | Status: AC
  Administered 2024-07-13: 2 g via INTRAVENOUS

## 2024-07-13 MED ORDER — CEFAZOLIN SODIUM-DEXTROSE 2-4 GM/100ML-% IV SOLN
INTRAVENOUS | Status: AC
  Filled 2024-07-13: qty 100

## 2024-07-13 MED ORDER — BUPIVACAINE-EPINEPHRINE 0.25% -1:200000 IJ SOLN
INTRAMUSCULAR | Status: DC | PRN
  Administered 2024-07-13: 20 mL

## 2024-07-13 MED ORDER — DROPERIDOL 2.5 MG/ML IJ SOLN: 0.6250 mg | Freq: Once | INTRAMUSCULAR | Status: DC | PRN

## 2024-07-13 MED ORDER — FENTANYL CITRATE (PF) 250 MCG/5ML IJ SOLN
INTRAMUSCULAR | Status: DC | PRN
  Administered 2024-07-13 (×2): 50 ug via INTRAVENOUS

## 2024-07-13 MED ORDER — LIDOCAINE 2% (20 MG/ML) 5 ML SYRINGE
INTRAMUSCULAR | Status: DC | PRN
  Administered 2024-07-13: 100 mg via INTRAVENOUS

## 2024-07-13 MED ORDER — CHLORHEXIDINE GLUCONATE 0.12 % MT SOLN: 15.0000 mL | Freq: Once | OROMUCOSAL | Status: AC

## 2024-07-13 MED ORDER — ONDANSETRON HCL 4 MG/2ML IJ SOLN
INTRAMUSCULAR | Status: AC
  Filled 2024-07-13: qty 2

## 2024-07-13 MED ORDER — CHLORHEXIDINE GLUCONATE 0.12 % MT SOLN
OROMUCOSAL | Status: AC
  Administered 2024-07-13: 15 mL via OROMUCOSAL
  Filled 2024-07-13: qty 15

## 2024-07-13 MED ORDER — OXYCODONE HCL 5 MG PO TABS: 5.0000 mg | ORAL_TABLET | Freq: Once | ORAL | Status: DC | PRN

## 2024-07-13 MED ORDER — PROPOFOL 10 MG/ML IV BOLUS
INTRAVENOUS | Status: DC | PRN
  Administered 2024-07-13: 100 mg via INTRAVENOUS

## 2024-07-13 MED ORDER — DEXAMETHASONE SOD PHOSPHATE PF 10 MG/ML IJ SOLN
INTRAMUSCULAR | Status: DC | PRN
  Administered 2024-07-13: 4 mg via INTRAVENOUS

## 2024-07-13 MED ORDER — FENTANYL CITRATE (PF) 100 MCG/2ML IJ SOLN
INTRAMUSCULAR | Status: AC
  Filled 2024-07-13: qty 2

## 2024-07-13 MED ORDER — PHENYLEPHRINE 80 MCG/ML (10ML) SYRINGE FOR IV PUSH (FOR BLOOD PRESSURE SUPPORT)
PREFILLED_SYRINGE | INTRAVENOUS | Status: DC | PRN
  Administered 2024-07-13 (×2): 160 ug via INTRAVENOUS

## 2024-07-13 MED ORDER — EPHEDRINE 5 MG/ML INJ
INTRAVENOUS | Status: AC
  Filled 2024-07-13: qty 5

## 2024-07-13 MED ORDER — ORAL CARE MOUTH RINSE: 15.0000 mL | Freq: Once | OROMUCOSAL | Status: AC

## 2024-07-13 MED ORDER — FENTANYL CITRATE (PF) 100 MCG/2ML IJ SOLN: 25.0000 ug | INTRAMUSCULAR | Status: DC | PRN

## 2024-07-13 NOTE — Transfer of Care (Signed)
 Immediate Anesthesia Transfer of Care Note  Patient: Natasha Dickson  Procedure(s) Performed: BREAST LUMPECTOMY WITH RADIOACTIVE SEED LOCALIZATION (Right: Breast)  Patient Location: PACU  Anesthesia Type:General  Level of Consciousness: awake, alert , and oriented  Airway & Oxygen Therapy: Patient Spontanous Breathing and Patient connected to nasal cannula oxygen  Post-op Assessment: Report given to RN and Post -op Vital signs reviewed and stable  Post vital signs: Reviewed and stable  Last Vitals:  Vitals Value Taken Time  BP 105/70 07/13/24 14:30  Temp    Pulse 65 07/13/24 14:31  Resp 17 07/13/24 14:31  SpO2 96 % 07/13/24 14:31  Vitals shown include unfiled device data.  Last Pain:  Vitals:   07/13/24 1251  TempSrc:   PainSc: 0-No pain         Complications: No notable events documented.

## 2024-07-13 NOTE — Anesthesia Postprocedure Evaluation (Signed)
"   Anesthesia Post Note  Patient: Natasha Dickson  Procedure(s) Performed: BREAST LUMPECTOMY WITH RADIOACTIVE SEED LOCALIZATION (Right: Breast)     Patient location during evaluation: PACU Anesthesia Type: General Level of consciousness: awake and alert Pain management: pain level controlled Vital Signs Assessment: post-procedure vital signs reviewed and stable Respiratory status: spontaneous breathing, nonlabored ventilation, respiratory function stable and patient connected to nasal cannula oxygen Cardiovascular status: blood pressure returned to baseline and stable Postop Assessment: no apparent nausea or vomiting Anesthetic complications: no   No notable events documented.  Last Vitals:  Vitals:   07/13/24 1445 07/13/24 1500  BP: (!) 111/58   Pulse: 75 64  Resp: 16 (!) 23  Temp:    SpO2: 95% 95%    Last Pain:  Vitals:   07/13/24 1500  TempSrc:   PainSc: 2                  Thom JONELLE Peoples      "

## 2024-07-13 NOTE — Anesthesia Procedure Notes (Signed)
 Procedure Name: LMA Insertion Date/Time: 07/13/2024 1:34 PM  Performed by: Lockie Flesher, CRNAPre-anesthesia Checklist: Patient identified, Emergency Drugs available, Suction available and Patient being monitored Patient Re-evaluated:Patient Re-evaluated prior to induction Oxygen Delivery Method: Circle System Utilized Preoxygenation: Pre-oxygenation with 100% oxygen Induction Type: IV induction Ventilation: Mask ventilation without difficulty LMA: LMA inserted LMA Size: 4.0 Number of attempts: 1 Airway Equipment and Method: Bite block Placement Confirmation: positive ETCO2 Tube secured with: Tape Dental Injury: Teeth and Oropharynx as per pre-operative assessment

## 2024-07-13 NOTE — Discharge Instructions (Signed)
 [facility] Office Phone Number [phone]  BREAST BIOPSY/ PARTIAL MASTECTOMY: POST OP INSTRUCTIONS  Always review your discharge instruction sheet given to you by the facility where your surgery was performed.  IF YOU HAVE DISABILITY OR FAMILY LEAVE FORMS, YOU MUST BRING THEM TO THE OFFICE FOR PROCESSING.  DO NOT GIVE THEM TO YOUR DOCTOR.  A prescription for pain medication may be given to you upon discharge.  Take your pain medication as prescribed, if needed.  If narcotic pain medicine is not needed, then you may take acetaminophen  (Tylenol ) or ibuprofen (Advil) as needed. Take your usually prescribed medications unless otherwise directed If you need a refill on your pain medication, please contact your pharmacy.  They will contact our office to request authorization.  Prescriptions will not be filled after 5pm or on week-ends. You should eat very light the first 24 hours after surgery, such as soup, crackers, pudding, etc.  Resume your normal diet the day after surgery. Most patients will experience some swelling and bruising in the breast.  Ice packs and a good support bra will help.  Swelling and bruising can take several days to resolve.  It is common to experience some constipation if taking pain medication after surgery.  Increasing fluid intake and taking a stool softener will usually help or prevent this problem from occurring.  A mild laxative (Milk of Magnesia or Miralax ) should be taken according to package directions if there are no bowel movements after 48 hours. Unless discharge instructions indicate otherwise, you may remove your bandages 24-48 hours after surgery, and you may shower at that time.  You may have steri-strips (small skin tapes) in place directly over the incision.  These strips should be left on the skin for 7-10 days.  If your surgeon used skin glue on the incision, you may shower in 24 hours.  The glue will flake off over the next 2-3 weeks.  Any  sutures or staples will be removed at the office during your follow-up visit. ACTIVITIES:  You may resume regular daily activities (gradually increasing) beginning the next day.  Wearing a good support bra or sports bra minimizes pain and swelling.  You may have sexual intercourse when it is comfortable. You may drive when you no longer are taking prescription pain medication, you can comfortably wear a seatbelt, and you can safely maneuver your car and apply brakes. RETURN TO WORK:  ______________________________________________________________________________________ Natasha Dickson should see your doctor in the office for a follow-up appointment approximately two weeks after your surgery.  Your doctors nurse will typically make your follow-up appointment when she calls you with your pathology report.  Expect your pathology report 2-3 business days after your surgery.  You may call to check if you do not hear from us  after three days. OTHER INSTRUCTIONS: _______________________________________________________________________________________________ _____________________________________________________________________________________________________________________________________ _____________________________________________________________________________________________________________________________________ _____________________________________________________________________________________________________________________________________  WHEN TO CALL YOUR DOCTOR: Fever over 101.0 Nausea and/or vomiting. Extreme swelling or bruising. Continued bleeding from incision. Increased pain, redness, or drainage from the incision.  The clinic staff is available to answer your questions during regular business hours.  Please dont hesitate to call and ask to speak to one of the nurses for clinical concerns.  If you have a medical emergency, go to the nearest emergency room or call 911.  A surgeon from [facility] is always on call at the hospital.  For further questions, please visit [email or web address]   \      RESTART  Xarelto  in 36 hours

## 2024-07-13 NOTE — Interval H&P Note (Signed)
 History and Physical Interval Note:  07/13/2024 12:52 PM  Natasha Dickson  has presented today for surgery, with the diagnosis of RIGHT BREAST CANCER.  The various methods of treatment have been discussed with the patient and family. After consideration of risks, benefits and other options for treatment, the patient has consented to  Procedures with comments: BREAST LUMPECTOMY WITH RADIOACTIVE SEED LOCALIZATION (Right) - RIGHT BREAST RADIOACTIVE SEED LUMPECTOMY as a surgical intervention.  The patient's history has been reviewed, patient examined, no change in status, stable for surgery.  I have reviewed the patient's chart and labs.  Questions were answered to the patient's satisfaction.     Lenia Housley A Virgie Kunda

## 2024-07-13 NOTE — Op Note (Signed)
 Preoperative diagnosis: Stage I right breast cancer upper outer quadrant ER positive  Postoperative diagnosis: Same  Procedure: Right breast seed localized lumpectomy  Surgeon: Debby Shipper, MD  Anesthesia: LMA 0.25% Marcaine  with epinephrine   EBL: Minimal  Specimen: Right breast tissue with seed and clip verified by Faxitron  Indications for procedure: The patient is a 79 year old female with stage I right breast cancer.  She is out for breast conserving surgery after being seen in the multidisciplinary clinic setting and reviewing all of her options.The procedure has been discussed with the patient. Alternatives to surgery have been discussed with the patient.  Risks of surgery include bleeding,  Infection,  Seroma formation, death,  and the need for further surgery.   The patient understands and wishes to proceed.    Description of procedure: The patient was met in the holding area and questions were answered.  Of note she underwent seed placement as an outpatient.  The right breast was marked as the correct site and all questions were answered.  She was taken back to the operating room placed supine upon the operating table.  After induction of general anesthesia, right breast was prepped and draped in sterile fashion and timeout performed.  She received appropriate preoperative antibiotics.  Neoprobe was used to locate the seed in the right breast upper outer quadrant.  She had significant ecchymoses there from previous core biopsy and seed placement.  A transverse incision was made over the signal.  All tissue around the seed and clip were excised with grossly negative margins.  The tissue was oriented with ink.  Faxitron images revealed seed and clip to be presents with grossly negative margins.  The cavities made hemostatic with cautery and clips.  Clips used to mark the cavity.  Local anesthetic infiltrated throughout.  The deep tissue planes were approximated with 3-0 Vicryl.  4 Monocryl  was used to close the skin in a subcuticular fashion.  Dermabond applied.  Breast binder placed.  All counts found to be correct.  The patient was awoke extubated taken to recovery in satisfactory condition.

## 2024-07-14 ENCOUNTER — Encounter (HOSPITAL_COMMUNITY): Payer: Self-pay | Admitting: Surgery

## 2024-07-16 LAB — SURGICAL PATHOLOGY

## 2024-08-02 ENCOUNTER — Ambulatory Visit

## 2024-08-02 ENCOUNTER — Ambulatory Visit: Admitting: Radiation Oncology

## 2024-11-26 ENCOUNTER — Inpatient Hospital Stay: Admitting: Physician Assistant

## 2024-11-26 ENCOUNTER — Inpatient Hospital Stay
